# Patient Record
Sex: Male | Born: 1935
Health system: Southern US, Community
[De-identification: ages and names within clinical notes are randomized; demographics above are authoritative.]

## PROBLEM LIST (undated history)

## (undated) DIAGNOSIS — D179 Benign lipomatous neoplasm, unspecified: Secondary | ICD-10-CM

## (undated) DIAGNOSIS — N281 Cyst of kidney, acquired: Secondary | ICD-10-CM

## (undated) DIAGNOSIS — Z960 Presence of urogenital implants: Secondary | ICD-10-CM

## (undated) DIAGNOSIS — G4733 Obstructive sleep apnea (adult) (pediatric): Secondary | ICD-10-CM

## (undated) DIAGNOSIS — E039 Hypothyroidism, unspecified: Secondary | ICD-10-CM

## (undated) DIAGNOSIS — E785 Hyperlipidemia, unspecified: Secondary | ICD-10-CM

## (undated) DIAGNOSIS — R0600 Dyspnea, unspecified: Secondary | ICD-10-CM

## (undated) DIAGNOSIS — D649 Anemia, unspecified: Secondary | ICD-10-CM

## (undated) DIAGNOSIS — J189 Pneumonia, unspecified organism: Secondary | ICD-10-CM

## (undated) DIAGNOSIS — K219 Gastro-esophageal reflux disease without esophagitis: Secondary | ICD-10-CM

## (undated) DIAGNOSIS — Z8546 Personal history of malignant neoplasm of prostate: Secondary | ICD-10-CM

## (undated) DIAGNOSIS — Z8679 Personal history of other diseases of the circulatory system: Secondary | ICD-10-CM

## (undated) DIAGNOSIS — C801 Malignant (primary) neoplasm, unspecified: Secondary | ICD-10-CM

## (undated) DIAGNOSIS — I700 Atherosclerosis of aorta: Secondary | ICD-10-CM

## (undated) DIAGNOSIS — E079 Disorder of thyroid, unspecified: Secondary | ICD-10-CM

## (undated) DIAGNOSIS — G4730 Sleep apnea, unspecified: Secondary | ICD-10-CM

## (undated) DIAGNOSIS — G510 Bell's palsy: Secondary | ICD-10-CM

## (undated) DIAGNOSIS — R7881 Bacteremia: Secondary | ICD-10-CM

## (undated) DIAGNOSIS — I10 Essential (primary) hypertension: Secondary | ICD-10-CM

## (undated) DIAGNOSIS — I2510 Atherosclerotic heart disease of native coronary artery without angina pectoris: Secondary | ICD-10-CM

## (undated) DIAGNOSIS — R0609 Other forms of dyspnea: Secondary | ICD-10-CM

## (undated) DIAGNOSIS — N200 Calculus of kidney: Secondary | ICD-10-CM

## (undated) DIAGNOSIS — D494 Neoplasm of unspecified behavior of bladder: Secondary | ICD-10-CM

## (undated) HISTORY — DX: Atherosclerotic heart disease of native coronary artery without angina pectoris: I25.10

## (undated) HISTORY — DX: Personal history of malignant neoplasm of prostate: Z85.46

## (undated) HISTORY — PX: TONSILLECTOMY: SUR1361

## (undated) HISTORY — PX: ESOPHAGOGASTRODUODENOSCOPY: SHX1529

## (undated) HISTORY — DX: Anemia, unspecified: D64.9

## (undated) HISTORY — DX: Sleep apnea, unspecified: G47.30

## (undated) HISTORY — DX: Hyperlipidemia, unspecified: E78.5

## (undated) HISTORY — DX: Benign lipomatous neoplasm, unspecified: D17.9

## (undated) HISTORY — DX: Bell's palsy: G51.0

## (undated) HISTORY — DX: Neoplasm of unspecified behavior of bladder: D49.4

## (undated) HISTORY — PX: PROSTATE SURGERY: SHX751

## (undated) HISTORY — DX: Bacteremia: R78.81

## (undated) HISTORY — PX: COLONOSCOPY: SHX174

## (undated) HISTORY — DX: Disorder of thyroid, unspecified: E07.9

## (undated) HISTORY — DX: Essential (primary) hypertension: I10

---

## 1973-09-22 HISTORY — PX: PROSTATE SURGERY: SHX751

## 1982-09-22 HISTORY — PX: OTHER SURGICAL HISTORY: SHX169

## 1984-09-22 HISTORY — PX: NASAL SEPTUM SURGERY: SHX37

## 1988-09-22 HISTORY — PX: NASAL SINUS SURGERY: SHX719

## 1990-08-13 HISTORY — PX: CARDIAC CATHETERIZATION: SHX172

## 2001-09-22 HISTORY — PX: OTHER SURGICAL HISTORY: SHX169

## 2001-11-30 ENCOUNTER — Ambulatory Visit (HOSPITAL_COMMUNITY): Admission: RE | Admit: 2001-11-30 | Discharge: 2001-11-30 | Payer: Self-pay | Admitting: Gastroenterology

## 2001-12-22 ENCOUNTER — Ambulatory Visit (HOSPITAL_COMMUNITY): Admission: RE | Admit: 2001-12-22 | Discharge: 2001-12-22 | Payer: Self-pay | Admitting: Gastroenterology

## 2003-09-23 HISTORY — PX: EYE SURGERY: SHX253

## 2003-09-23 HISTORY — PX: APPENDECTOMY: SHX54

## 2003-09-23 HISTORY — PX: CATARACT EXTRACTION: SUR2

## 2005-10-03 ENCOUNTER — Ambulatory Visit: Payer: Self-pay | Admitting: Urology

## 2006-04-08 ENCOUNTER — Ambulatory Visit: Payer: Self-pay | Admitting: Urology

## 2006-09-22 HISTORY — PX: CATARACT EXTRACTION: SUR2

## 2006-09-22 HISTORY — PX: EYE SURGERY: SHX253

## 2006-12-29 ENCOUNTER — Ambulatory Visit: Payer: Self-pay | Admitting: Urology

## 2007-07-10 ENCOUNTER — Emergency Department: Payer: Self-pay | Admitting: Emergency Medicine

## 2007-07-16 ENCOUNTER — Ambulatory Visit: Payer: Self-pay | Admitting: Urology

## 2009-01-05 DIAGNOSIS — G4730 Sleep apnea, unspecified: Secondary | ICD-10-CM | POA: Insufficient documentation

## 2009-11-05 ENCOUNTER — Ambulatory Visit: Payer: Self-pay | Admitting: Urology

## 2009-11-21 ENCOUNTER — Ambulatory Visit: Payer: Self-pay | Admitting: Specialist

## 2009-11-21 ENCOUNTER — Other Ambulatory Visit: Payer: Self-pay | Admitting: Specialist

## 2010-11-27 ENCOUNTER — Emergency Department: Payer: Self-pay | Admitting: Emergency Medicine

## 2010-12-04 ENCOUNTER — Ambulatory Visit: Payer: Self-pay | Admitting: Family Medicine

## 2011-02-21 DIAGNOSIS — B9562 Methicillin resistant Staphylococcus aureus infection as the cause of diseases classified elsewhere: Secondary | ICD-10-CM

## 2011-02-21 DIAGNOSIS — R7881 Bacteremia: Secondary | ICD-10-CM

## 2011-02-21 HISTORY — DX: Methicillin resistant Staphylococcus aureus infection as the cause of diseases classified elsewhere: B95.62

## 2011-02-21 HISTORY — DX: Bacteremia: R78.81

## 2011-02-24 ENCOUNTER — Ambulatory Visit: Payer: Self-pay | Admitting: Urology

## 2011-02-24 ENCOUNTER — Inpatient Hospital Stay: Payer: Self-pay | Admitting: Internal Medicine

## 2011-02-27 DIAGNOSIS — I517 Cardiomegaly: Secondary | ICD-10-CM

## 2011-02-28 DIAGNOSIS — I059 Rheumatic mitral valve disease, unspecified: Secondary | ICD-10-CM

## 2011-03-05 ENCOUNTER — Encounter: Payer: Self-pay | Admitting: Internal Medicine

## 2011-03-23 ENCOUNTER — Encounter: Payer: Self-pay | Admitting: Internal Medicine

## 2011-03-27 ENCOUNTER — Ambulatory Visit: Payer: Self-pay | Admitting: Urology

## 2011-04-23 ENCOUNTER — Encounter: Payer: Self-pay | Admitting: Internal Medicine

## 2011-06-26 HISTORY — PX: HAND SURGERY: SHX662

## 2011-07-09 ENCOUNTER — Ambulatory Visit: Payer: Self-pay | Admitting: Urology

## 2011-07-25 ENCOUNTER — Emergency Department: Payer: Self-pay | Admitting: Internal Medicine

## 2011-10-07 ENCOUNTER — Ambulatory Visit: Payer: Self-pay | Admitting: Urology

## 2011-10-07 DIAGNOSIS — D414 Neoplasm of uncertain behavior of bladder: Secondary | ICD-10-CM | POA: Diagnosis not present

## 2011-10-07 DIAGNOSIS — Z01812 Encounter for preprocedural laboratory examination: Secondary | ICD-10-CM | POA: Diagnosis not present

## 2011-10-07 LAB — BASIC METABOLIC PANEL
Anion Gap: 4 — ABNORMAL LOW (ref 7–16)
Calcium, Total: 8.8 mg/dL (ref 8.5–10.1)
Chloride: 107 mmol/L (ref 98–107)
Co2: 30 mmol/L (ref 21–32)
Creatinine: 1.45 mg/dL — ABNORMAL HIGH (ref 0.60–1.30)
Glucose: 120 mg/dL — ABNORMAL HIGH (ref 65–99)
Potassium: 5 mmol/L (ref 3.5–5.1)

## 2011-10-07 LAB — CBC WITH DIFFERENTIAL/PLATELET
Basophil #: 0 10*3/uL (ref 0.0–0.1)
Basophil %: 0.4 %
Eosinophil #: 0.1 10*3/uL (ref 0.0–0.7)
HCT: 37.3 % — ABNORMAL LOW (ref 40.0–52.0)
HGB: 12.5 g/dL — ABNORMAL LOW (ref 13.0–18.0)
Lymphocyte #: 2.1 10*3/uL (ref 1.0–3.6)
Lymphocyte %: 29.3 %
MCH: 31.3 pg (ref 26.0–34.0)
MCHC: 33.5 g/dL (ref 32.0–36.0)
MCV: 93 fL (ref 80–100)
Monocyte #: 0.8 10*3/uL — ABNORMAL HIGH (ref 0.0–0.7)
Platelet: 227 10*3/uL (ref 150–440)
RDW: 13.8 % (ref 11.5–14.5)

## 2011-10-14 ENCOUNTER — Ambulatory Visit: Payer: Self-pay | Admitting: Urology

## 2011-10-14 DIAGNOSIS — Z87891 Personal history of nicotine dependence: Secondary | ICD-10-CM | POA: Diagnosis not present

## 2011-10-14 DIAGNOSIS — D303 Benign neoplasm of bladder: Secondary | ICD-10-CM | POA: Diagnosis not present

## 2011-10-14 DIAGNOSIS — D414 Neoplasm of uncertain behavior of bladder: Secondary | ICD-10-CM | POA: Diagnosis not present

## 2011-10-14 DIAGNOSIS — N3040 Irradiation cystitis without hematuria: Secondary | ICD-10-CM | POA: Diagnosis not present

## 2011-10-14 DIAGNOSIS — G4730 Sleep apnea, unspecified: Secondary | ICD-10-CM | POA: Diagnosis not present

## 2011-10-14 DIAGNOSIS — G510 Bell's palsy: Secondary | ICD-10-CM | POA: Diagnosis not present

## 2011-10-14 DIAGNOSIS — N289 Disorder of kidney and ureter, unspecified: Secondary | ICD-10-CM | POA: Diagnosis not present

## 2011-10-14 DIAGNOSIS — I2510 Atherosclerotic heart disease of native coronary artery without angina pectoris: Secondary | ICD-10-CM | POA: Diagnosis not present

## 2011-10-14 DIAGNOSIS — Z79899 Other long term (current) drug therapy: Secondary | ICD-10-CM | POA: Diagnosis not present

## 2011-10-14 DIAGNOSIS — J40 Bronchitis, not specified as acute or chronic: Secondary | ICD-10-CM | POA: Diagnosis not present

## 2011-10-14 DIAGNOSIS — E079 Disorder of thyroid, unspecified: Secondary | ICD-10-CM | POA: Diagnosis not present

## 2011-10-14 DIAGNOSIS — D494 Neoplasm of unspecified behavior of bladder: Secondary | ICD-10-CM | POA: Diagnosis not present

## 2011-10-15 LAB — PATHOLOGY REPORT

## 2011-10-27 DIAGNOSIS — L259 Unspecified contact dermatitis, unspecified cause: Secondary | ICD-10-CM | POA: Diagnosis not present

## 2011-10-27 DIAGNOSIS — D414 Neoplasm of uncertain behavior of bladder: Secondary | ICD-10-CM | POA: Diagnosis not present

## 2011-10-27 DIAGNOSIS — N182 Chronic kidney disease, stage 2 (mild): Secondary | ICD-10-CM | POA: Diagnosis not present

## 2011-10-27 DIAGNOSIS — N3020 Other chronic cystitis without hematuria: Secondary | ICD-10-CM | POA: Diagnosis not present

## 2011-10-27 DIAGNOSIS — R310 Gross hematuria: Secondary | ICD-10-CM | POA: Diagnosis not present

## 2011-11-06 DIAGNOSIS — I10 Essential (primary) hypertension: Secondary | ICD-10-CM | POA: Diagnosis not present

## 2011-11-06 DIAGNOSIS — Z0000 Encounter for general adult medical examination without abnormal findings: Secondary | ICD-10-CM | POA: Diagnosis not present

## 2011-11-06 DIAGNOSIS — E785 Hyperlipidemia, unspecified: Secondary | ICD-10-CM | POA: Diagnosis not present

## 2011-11-06 DIAGNOSIS — E039 Hypothyroidism, unspecified: Secondary | ICD-10-CM | POA: Diagnosis not present

## 2011-11-10 DIAGNOSIS — E7800 Pure hypercholesterolemia, unspecified: Secondary | ICD-10-CM | POA: Diagnosis not present

## 2011-12-01 DIAGNOSIS — H35349 Macular cyst, hole, or pseudohole, unspecified eye: Secondary | ICD-10-CM | POA: Diagnosis not present

## 2012-01-16 DIAGNOSIS — E785 Hyperlipidemia, unspecified: Secondary | ICD-10-CM | POA: Diagnosis not present

## 2012-01-16 DIAGNOSIS — R55 Syncope and collapse: Secondary | ICD-10-CM | POA: Diagnosis not present

## 2012-01-16 DIAGNOSIS — N3020 Other chronic cystitis without hematuria: Secondary | ICD-10-CM | POA: Diagnosis not present

## 2012-01-16 DIAGNOSIS — Z0000 Encounter for general adult medical examination without abnormal findings: Secondary | ICD-10-CM | POA: Diagnosis not present

## 2012-01-16 DIAGNOSIS — E039 Hypothyroidism, unspecified: Secondary | ICD-10-CM | POA: Diagnosis not present

## 2012-01-20 DIAGNOSIS — R509 Fever, unspecified: Secondary | ICD-10-CM | POA: Diagnosis not present

## 2012-01-20 DIAGNOSIS — Z0000 Encounter for general adult medical examination without abnormal findings: Secondary | ICD-10-CM | POA: Diagnosis not present

## 2012-01-20 DIAGNOSIS — E039 Hypothyroidism, unspecified: Secondary | ICD-10-CM | POA: Diagnosis not present

## 2012-01-20 DIAGNOSIS — E785 Hyperlipidemia, unspecified: Secondary | ICD-10-CM | POA: Diagnosis not present

## 2012-03-15 DIAGNOSIS — D414 Neoplasm of uncertain behavior of bladder: Secondary | ICD-10-CM | POA: Diagnosis not present

## 2012-03-15 DIAGNOSIS — R310 Gross hematuria: Secondary | ICD-10-CM | POA: Diagnosis not present

## 2012-03-15 DIAGNOSIS — R3129 Other microscopic hematuria: Secondary | ICD-10-CM | POA: Diagnosis not present

## 2012-03-16 ENCOUNTER — Ambulatory Visit: Payer: Self-pay | Admitting: Urology

## 2012-03-16 DIAGNOSIS — I2510 Atherosclerotic heart disease of native coronary artery without angina pectoris: Secondary | ICD-10-CM | POA: Diagnosis not present

## 2012-03-16 DIAGNOSIS — Z01812 Encounter for preprocedural laboratory examination: Secondary | ICD-10-CM | POA: Diagnosis not present

## 2012-03-16 LAB — CBC WITH DIFFERENTIAL/PLATELET
Basophil %: 0.5 %
HCT: 37.7 % — ABNORMAL LOW (ref 40.0–52.0)
HGB: 12.6 g/dL — ABNORMAL LOW (ref 13.0–18.0)
Lymphocyte #: 2 10*3/uL (ref 1.0–3.6)
Lymphocyte %: 31 %
MCH: 31.3 pg (ref 26.0–34.0)
Neutrophil #: 3.6 10*3/uL (ref 1.4–6.5)
Neutrophil %: 55.5 %
RDW: 13.6 % (ref 11.5–14.5)
WBC: 6.5 10*3/uL (ref 3.8–10.6)

## 2012-03-16 LAB — BASIC METABOLIC PANEL
Co2: 31 mmol/L (ref 21–32)
EGFR (African American): 43 — ABNORMAL LOW
EGFR (Non-African Amer.): 37 — ABNORMAL LOW
Osmolality: 287 (ref 275–301)
Sodium: 141 mmol/L (ref 136–145)

## 2012-03-19 DIAGNOSIS — Z0389 Encounter for observation for other suspected diseases and conditions ruled out: Secondary | ICD-10-CM | POA: Diagnosis not present

## 2012-03-19 DIAGNOSIS — R011 Cardiac murmur, unspecified: Secondary | ICD-10-CM | POA: Diagnosis not present

## 2012-03-23 ENCOUNTER — Ambulatory Visit: Payer: Self-pay | Admitting: Urology

## 2012-03-23 DIAGNOSIS — N269 Renal sclerosis, unspecified: Secondary | ICD-10-CM | POA: Diagnosis not present

## 2012-03-23 DIAGNOSIS — Z8546 Personal history of malignant neoplasm of prostate: Secondary | ICD-10-CM | POA: Diagnosis not present

## 2012-03-23 DIAGNOSIS — Z801 Family history of malignant neoplasm of trachea, bronchus and lung: Secondary | ICD-10-CM | POA: Diagnosis not present

## 2012-03-23 DIAGNOSIS — Z882 Allergy status to sulfonamides status: Secondary | ICD-10-CM | POA: Diagnosis not present

## 2012-03-23 DIAGNOSIS — Z7982 Long term (current) use of aspirin: Secondary | ICD-10-CM | POA: Diagnosis not present

## 2012-03-23 DIAGNOSIS — D414 Neoplasm of uncertain behavior of bladder: Secondary | ICD-10-CM | POA: Diagnosis not present

## 2012-03-23 DIAGNOSIS — D494 Neoplasm of unspecified behavior of bladder: Secondary | ICD-10-CM | POA: Diagnosis not present

## 2012-03-23 DIAGNOSIS — Z87891 Personal history of nicotine dependence: Secondary | ICD-10-CM | POA: Diagnosis not present

## 2012-03-23 DIAGNOSIS — N3020 Other chronic cystitis without hematuria: Secondary | ICD-10-CM | POA: Diagnosis not present

## 2012-03-23 DIAGNOSIS — N182 Chronic kidney disease, stage 2 (mild): Secondary | ICD-10-CM | POA: Diagnosis not present

## 2012-03-23 DIAGNOSIS — D303 Benign neoplasm of bladder: Secondary | ICD-10-CM | POA: Diagnosis not present

## 2012-03-23 DIAGNOSIS — Z881 Allergy status to other antibiotic agents status: Secondary | ICD-10-CM | POA: Diagnosis not present

## 2012-03-23 DIAGNOSIS — Z79899 Other long term (current) drug therapy: Secondary | ICD-10-CM | POA: Diagnosis not present

## 2012-03-23 DIAGNOSIS — I10 Essential (primary) hypertension: Secondary | ICD-10-CM | POA: Diagnosis not present

## 2012-03-23 DIAGNOSIS — Z87440 Personal history of urinary (tract) infections: Secondary | ICD-10-CM | POA: Diagnosis not present

## 2012-03-23 DIAGNOSIS — Z9079 Acquired absence of other genital organ(s): Secondary | ICD-10-CM | POA: Diagnosis not present

## 2012-03-23 DIAGNOSIS — Z8614 Personal history of Methicillin resistant Staphylococcus aureus infection: Secondary | ICD-10-CM | POA: Diagnosis not present

## 2012-03-23 DIAGNOSIS — C679 Malignant neoplasm of bladder, unspecified: Secondary | ICD-10-CM | POA: Diagnosis not present

## 2012-03-23 DIAGNOSIS — G4730 Sleep apnea, unspecified: Secondary | ICD-10-CM | POA: Diagnosis not present

## 2012-03-23 DIAGNOSIS — I2510 Atherosclerotic heart disease of native coronary artery without angina pectoris: Secondary | ICD-10-CM | POA: Diagnosis not present

## 2012-03-23 DIAGNOSIS — Z888 Allergy status to other drugs, medicaments and biological substances status: Secondary | ICD-10-CM | POA: Diagnosis not present

## 2012-03-23 DIAGNOSIS — J42 Unspecified chronic bronchitis: Secondary | ICD-10-CM | POA: Diagnosis not present

## 2012-03-23 DIAGNOSIS — J45909 Unspecified asthma, uncomplicated: Secondary | ICD-10-CM | POA: Diagnosis not present

## 2012-04-08 DIAGNOSIS — N182 Chronic kidney disease, stage 2 (mild): Secondary | ICD-10-CM | POA: Diagnosis not present

## 2012-04-08 DIAGNOSIS — D414 Neoplasm of uncertain behavior of bladder: Secondary | ICD-10-CM | POA: Diagnosis not present

## 2012-04-08 DIAGNOSIS — Z8546 Personal history of malignant neoplasm of prostate: Secondary | ICD-10-CM | POA: Diagnosis not present

## 2012-04-08 DIAGNOSIS — N3020 Other chronic cystitis without hematuria: Secondary | ICD-10-CM | POA: Diagnosis not present

## 2012-06-24 ENCOUNTER — Ambulatory Visit: Payer: Self-pay | Admitting: Unknown Physician Specialty

## 2012-06-24 DIAGNOSIS — I2510 Atherosclerotic heart disease of native coronary artery without angina pectoris: Secondary | ICD-10-CM | POA: Diagnosis not present

## 2012-06-24 DIAGNOSIS — E785 Hyperlipidemia, unspecified: Secondary | ICD-10-CM | POA: Diagnosis not present

## 2012-06-24 DIAGNOSIS — Z8546 Personal history of malignant neoplasm of prostate: Secondary | ICD-10-CM | POA: Diagnosis not present

## 2012-06-24 DIAGNOSIS — Z801 Family history of malignant neoplasm of trachea, bronchus and lung: Secondary | ICD-10-CM | POA: Diagnosis not present

## 2012-06-24 DIAGNOSIS — K648 Other hemorrhoids: Secondary | ICD-10-CM | POA: Diagnosis not present

## 2012-06-24 DIAGNOSIS — G4730 Sleep apnea, unspecified: Secondary | ICD-10-CM | POA: Diagnosis not present

## 2012-06-24 DIAGNOSIS — Z87891 Personal history of nicotine dependence: Secondary | ICD-10-CM | POA: Diagnosis not present

## 2012-06-24 DIAGNOSIS — Z79899 Other long term (current) drug therapy: Secondary | ICD-10-CM | POA: Diagnosis not present

## 2012-06-24 DIAGNOSIS — G510 Bell's palsy: Secondary | ICD-10-CM | POA: Diagnosis not present

## 2012-06-24 DIAGNOSIS — Z7982 Long term (current) use of aspirin: Secondary | ICD-10-CM | POA: Diagnosis not present

## 2012-06-24 DIAGNOSIS — R32 Unspecified urinary incontinence: Secondary | ICD-10-CM | POA: Diagnosis not present

## 2012-06-24 DIAGNOSIS — I10 Essential (primary) hypertension: Secondary | ICD-10-CM | POA: Diagnosis not present

## 2012-06-24 DIAGNOSIS — E039 Hypothyroidism, unspecified: Secondary | ICD-10-CM | POA: Diagnosis not present

## 2012-06-24 DIAGNOSIS — Z1211 Encounter for screening for malignant neoplasm of colon: Secondary | ICD-10-CM | POA: Diagnosis not present

## 2012-06-24 DIAGNOSIS — K5520 Angiodysplasia of colon without hemorrhage: Secondary | ICD-10-CM | POA: Diagnosis not present

## 2012-06-24 LAB — HM COLONOSCOPY

## 2012-07-01 DIAGNOSIS — E039 Hypothyroidism, unspecified: Secondary | ICD-10-CM | POA: Diagnosis not present

## 2012-07-01 DIAGNOSIS — Z23 Encounter for immunization: Secondary | ICD-10-CM | POA: Diagnosis not present

## 2012-07-01 DIAGNOSIS — E7800 Pure hypercholesterolemia, unspecified: Secondary | ICD-10-CM | POA: Diagnosis not present

## 2012-07-01 DIAGNOSIS — Z0000 Encounter for general adult medical examination without abnormal findings: Secondary | ICD-10-CM | POA: Diagnosis not present

## 2012-07-01 DIAGNOSIS — I2510 Atherosclerotic heart disease of native coronary artery without angina pectoris: Secondary | ICD-10-CM | POA: Diagnosis not present

## 2012-07-01 DIAGNOSIS — I10 Essential (primary) hypertension: Secondary | ICD-10-CM | POA: Diagnosis not present

## 2012-07-02 DIAGNOSIS — I10 Essential (primary) hypertension: Secondary | ICD-10-CM | POA: Diagnosis not present

## 2012-07-02 DIAGNOSIS — E039 Hypothyroidism, unspecified: Secondary | ICD-10-CM | POA: Diagnosis not present

## 2012-07-02 DIAGNOSIS — E7800 Pure hypercholesterolemia, unspecified: Secondary | ICD-10-CM | POA: Diagnosis not present

## 2012-07-02 DIAGNOSIS — Z8546 Personal history of malignant neoplasm of prostate: Secondary | ICD-10-CM | POA: Diagnosis not present

## 2012-08-12 DIAGNOSIS — R310 Gross hematuria: Secondary | ICD-10-CM | POA: Diagnosis not present

## 2012-08-12 DIAGNOSIS — D414 Neoplasm of uncertain behavior of bladder: Secondary | ICD-10-CM | POA: Diagnosis not present

## 2012-08-12 DIAGNOSIS — Z8546 Personal history of malignant neoplasm of prostate: Secondary | ICD-10-CM | POA: Diagnosis not present

## 2012-08-16 DIAGNOSIS — L219 Seborrheic dermatitis, unspecified: Secondary | ICD-10-CM | POA: Diagnosis not present

## 2012-08-16 DIAGNOSIS — L570 Actinic keratosis: Secondary | ICD-10-CM | POA: Diagnosis not present

## 2012-09-21 DIAGNOSIS — C44221 Squamous cell carcinoma of skin of unspecified ear and external auricular canal: Secondary | ICD-10-CM | POA: Diagnosis not present

## 2012-09-21 DIAGNOSIS — D485 Neoplasm of uncertain behavior of skin: Secondary | ICD-10-CM | POA: Diagnosis not present

## 2012-10-16 DIAGNOSIS — I10 Essential (primary) hypertension: Secondary | ICD-10-CM | POA: Diagnosis not present

## 2012-10-16 DIAGNOSIS — I4891 Unspecified atrial fibrillation: Secondary | ICD-10-CM | POA: Diagnosis not present

## 2012-10-17 DIAGNOSIS — I4891 Unspecified atrial fibrillation: Secondary | ICD-10-CM | POA: Diagnosis not present

## 2012-10-17 DIAGNOSIS — I10 Essential (primary) hypertension: Secondary | ICD-10-CM | POA: Diagnosis not present

## 2012-10-18 DIAGNOSIS — K921 Melena: Secondary | ICD-10-CM | POA: Insufficient documentation

## 2012-10-18 DIAGNOSIS — R079 Chest pain, unspecified: Secondary | ICD-10-CM | POA: Diagnosis not present

## 2012-10-18 DIAGNOSIS — IMO0001 Reserved for inherently not codable concepts without codable children: Secondary | ICD-10-CM | POA: Insufficient documentation

## 2012-10-18 DIAGNOSIS — K5900 Constipation, unspecified: Secondary | ICD-10-CM | POA: Diagnosis not present

## 2012-10-18 DIAGNOSIS — I2510 Atherosclerotic heart disease of native coronary artery without angina pectoris: Secondary | ICD-10-CM | POA: Diagnosis not present

## 2012-10-19 DIAGNOSIS — I209 Angina pectoris, unspecified: Secondary | ICD-10-CM | POA: Diagnosis not present

## 2012-10-19 DIAGNOSIS — R011 Cardiac murmur, unspecified: Secondary | ICD-10-CM | POA: Diagnosis not present

## 2012-10-20 DIAGNOSIS — C44221 Squamous cell carcinoma of skin of unspecified ear and external auricular canal: Secondary | ICD-10-CM | POA: Diagnosis not present

## 2012-10-21 ENCOUNTER — Inpatient Hospital Stay: Payer: Self-pay | Admitting: Internal Medicine

## 2012-10-21 DIAGNOSIS — Z87891 Personal history of nicotine dependence: Secondary | ICD-10-CM | POA: Diagnosis not present

## 2012-10-21 DIAGNOSIS — I2510 Atherosclerotic heart disease of native coronary artery without angina pectoris: Secondary | ICD-10-CM | POA: Diagnosis not present

## 2012-10-21 DIAGNOSIS — N189 Chronic kidney disease, unspecified: Secondary | ICD-10-CM | POA: Diagnosis not present

## 2012-10-21 DIAGNOSIS — K921 Melena: Secondary | ICD-10-CM | POA: Diagnosis not present

## 2012-10-21 DIAGNOSIS — Z91041 Radiographic dye allergy status: Secondary | ICD-10-CM | POA: Diagnosis not present

## 2012-10-21 DIAGNOSIS — R195 Other fecal abnormalities: Secondary | ICD-10-CM | POA: Diagnosis not present

## 2012-10-21 DIAGNOSIS — D649 Anemia, unspecified: Secondary | ICD-10-CM | POA: Diagnosis not present

## 2012-10-21 DIAGNOSIS — K2990 Gastroduodenitis, unspecified, without bleeding: Secondary | ICD-10-CM | POA: Diagnosis not present

## 2012-10-21 DIAGNOSIS — Z9079 Acquired absence of other genital organ(s): Secondary | ICD-10-CM | POA: Diagnosis not present

## 2012-10-21 DIAGNOSIS — I129 Hypertensive chronic kidney disease with stage 1 through stage 4 chronic kidney disease, or unspecified chronic kidney disease: Secondary | ICD-10-CM | POA: Diagnosis present

## 2012-10-21 DIAGNOSIS — K2970 Gastritis, unspecified, without bleeding: Secondary | ICD-10-CM | POA: Diagnosis present

## 2012-10-21 DIAGNOSIS — E785 Hyperlipidemia, unspecified: Secondary | ICD-10-CM | POA: Diagnosis not present

## 2012-10-21 DIAGNOSIS — Z7902 Long term (current) use of antithrombotics/antiplatelets: Secondary | ICD-10-CM | POA: Diagnosis not present

## 2012-10-21 DIAGNOSIS — K5520 Angiodysplasia of colon without hemorrhage: Secondary | ICD-10-CM | POA: Diagnosis not present

## 2012-10-21 DIAGNOSIS — D62 Acute posthemorrhagic anemia: Secondary | ICD-10-CM | POA: Diagnosis not present

## 2012-10-21 DIAGNOSIS — K920 Hematemesis: Secondary | ICD-10-CM | POA: Diagnosis not present

## 2012-10-21 DIAGNOSIS — K922 Gastrointestinal hemorrhage, unspecified: Secondary | ICD-10-CM | POA: Diagnosis not present

## 2012-10-21 DIAGNOSIS — Z888 Allergy status to other drugs, medicaments and biological substances status: Secondary | ICD-10-CM | POA: Diagnosis not present

## 2012-10-21 DIAGNOSIS — Z8546 Personal history of malignant neoplasm of prostate: Secondary | ICD-10-CM | POA: Diagnosis not present

## 2012-10-21 DIAGNOSIS — Z882 Allergy status to sulfonamides status: Secondary | ICD-10-CM | POA: Diagnosis not present

## 2012-10-21 DIAGNOSIS — I10 Essential (primary) hypertension: Secondary | ICD-10-CM | POA: Diagnosis not present

## 2012-10-21 DIAGNOSIS — Z801 Family history of malignant neoplasm of trachea, bronchus and lung: Secondary | ICD-10-CM | POA: Diagnosis not present

## 2012-10-21 DIAGNOSIS — Z7982 Long term (current) use of aspirin: Secondary | ICD-10-CM | POA: Diagnosis not present

## 2012-10-21 LAB — CBC
HCT: 22.1 % — ABNORMAL LOW (ref 40.0–52.0)
HGB: 7.2 g/dL — ABNORMAL LOW (ref 13.0–18.0)
MCH: 30.5 pg (ref 26.0–34.0)
MCV: 93 fL (ref 80–100)
RBC: 2.37 10*6/uL — ABNORMAL LOW (ref 4.40–5.90)
RDW: 13.5 % (ref 11.5–14.5)

## 2012-10-21 LAB — COMPREHENSIVE METABOLIC PANEL
Albumin: 3.6 g/dL (ref 3.4–5.0)
BUN: 32 mg/dL — ABNORMAL HIGH (ref 7–18)
Bilirubin,Total: 0.2 mg/dL (ref 0.2–1.0)
Calcium, Total: 8.4 mg/dL — ABNORMAL LOW (ref 8.5–10.1)
Co2: 20 mmol/L — ABNORMAL LOW (ref 21–32)
Creatinine: 1.48 mg/dL — ABNORMAL HIGH (ref 0.60–1.30)
EGFR (African American): 50 — ABNORMAL LOW
EGFR (Non-African Amer.): 43 — ABNORMAL LOW
Glucose: 116 mg/dL — ABNORMAL HIGH (ref 65–99)
Osmolality: 291 (ref 275–301)
Potassium: 4.4 mmol/L (ref 3.5–5.1)
SGOT(AST): 24 U/L (ref 15–37)
Sodium: 142 mmol/L (ref 136–145)

## 2012-10-21 LAB — URINALYSIS, COMPLETE
Blood: NEGATIVE
Glucose,UR: NEGATIVE mg/dL (ref 0–75)
Ketone: NEGATIVE
Leukocyte Esterase: NEGATIVE
Ph: 5 (ref 4.5–8.0)
Protein: NEGATIVE
RBC,UR: <1 /HPF (ref 0–5)
Specific Gravity: 1.018 (ref 1.003–1.030)
Squamous Epithelial: <1
WBC UR: 1 /HPF (ref 0–5)

## 2012-10-21 LAB — TROPONIN I: Troponin-I: <0.02 ng/mL

## 2012-10-21 LAB — LIPASE, BLOOD: Lipase: 278 U/L (ref 73–393)

## 2012-10-22 DIAGNOSIS — R195 Other fecal abnormalities: Secondary | ICD-10-CM | POA: Diagnosis not present

## 2012-10-22 LAB — CBC WITH DIFFERENTIAL/PLATELET
Basophil #: 0 10*3/uL (ref 0.0–0.1)
Basophil #: 0 10*3/uL (ref 0.0–0.1)
Basophil %: 0.3 %
Basophil %: 0.6 %
Eosinophil #: 0.1 10*3/uL (ref 0.0–0.7)
Eosinophil %: 2.3 %
HCT: 27 % — ABNORMAL LOW (ref 40.0–52.0)
HGB: 9.2 g/dL — ABNORMAL LOW (ref 13.0–18.0)
Lymphocyte #: 1.2 10*3/uL (ref 1.0–3.6)
Lymphocyte %: 20.9 %
MCH: 31.3 pg (ref 26.0–34.0)
MCV: 91 fL (ref 80–100)
Monocyte #: 0.7 x10 3/mm (ref 0.2–1.0)
Monocyte %: 11.1 %
Neutrophil #: 4.2 10*3/uL (ref 1.4–6.5)
Neutrophil %: 67.1 %
Neutrophil %: 63.5 %
Platelet: 199 10*3/uL (ref 150–440)
Platelet: 207 10*3/uL (ref 150–440)
RBC: 2.96 10*6/uL — ABNORMAL LOW (ref 4.40–5.90)
RBC: 2.95 10*6/uL — ABNORMAL LOW (ref 4.40–5.90)
RDW: 13.7 % (ref 11.5–14.5)
RDW: 13.4 % (ref 11.5–14.5)
WBC: 5.8 10*3/uL (ref 3.8–10.6)

## 2012-10-22 LAB — BASIC METABOLIC PANEL
Anion Gap: 6 — ABNORMAL LOW (ref 7–16)
Calcium, Total: 8.2 mg/dL — ABNORMAL LOW (ref 8.5–10.1)
Co2: 25 mmol/L (ref 21–32)
Creatinine: 1.4 mg/dL — ABNORMAL HIGH (ref 0.60–1.30)
EGFR (Non-African Amer.): 46 — ABNORMAL LOW
Osmolality: 289 (ref 275–301)

## 2012-10-23 LAB — BASIC METABOLIC PANEL
Anion Gap: 9 (ref 7–16)
Calcium, Total: 8.3 mg/dL — ABNORMAL LOW (ref 8.5–10.1)
Chloride: 111 mmol/L — ABNORMAL HIGH (ref 98–107)
Co2: 25 mmol/L (ref 21–32)
EGFR (African American): 52 — ABNORMAL LOW
EGFR (Non-African Amer.): 45 — ABNORMAL LOW
Glucose: 77 mg/dL (ref 65–99)
Osmolality: 289 (ref 275–301)
Potassium: 3.4 mmol/L — ABNORMAL LOW (ref 3.5–5.1)
Sodium: 145 mmol/L (ref 136–145)

## 2012-10-23 LAB — HEMOGLOBIN: HGB: 8.7 g/dL — ABNORMAL LOW (ref 13.0–18.0)

## 2012-10-24 LAB — CBC WITH DIFFERENTIAL/PLATELET
Basophil #: 0 10*3/uL (ref 0.0–0.1)
Basophil %: 0.4 %
Eosinophil %: 0.9 %
HGB: 9.2 g/dL — ABNORMAL LOW (ref 13.0–18.0)
Lymphocyte #: 1.5 10*3/uL (ref 1.0–3.6)
Lymphocyte %: 18.2 %
MCH: 31.1 pg (ref 26.0–34.0)
MCHC: 33.7 g/dL (ref 32.0–36.0)
Monocyte %: 16 %
Neutrophil #: 5.3 10*3/uL (ref 1.4–6.5)
Platelet: 196 10*3/uL (ref 150–440)
RDW: 13.9 % (ref 11.5–14.5)

## 2012-10-25 LAB — CBC WITH DIFFERENTIAL/PLATELET
HGB: 8 g/dL — ABNORMAL LOW (ref 13.0–18.0)
Lymphocyte %: 21.7 %
MCHC: 31.8 g/dL — ABNORMAL LOW (ref 32.0–36.0)
MCV: 93 fL (ref 80–100)
Neutrophil #: 4 10*3/uL (ref 1.4–6.5)
Neutrophil %: 56.5 %
Platelet: 214 10*3/uL (ref 150–440)
RBC: 2.72 10*6/uL — ABNORMAL LOW (ref 4.40–5.90)

## 2012-10-26 LAB — CBC WITH DIFFERENTIAL/PLATELET
Basophil #: 0 10*3/uL (ref 0.0–0.1)
Basophil %: 0.5 %
HCT: 25.3 % — ABNORMAL LOW (ref 40.0–52.0)
Lymphocyte #: 1.5 10*3/uL (ref 1.0–3.6)
MCV: 92 fL (ref 80–100)
Monocyte #: 0.8 x10 3/mm (ref 0.2–1.0)
Monocyte %: 13.2 %
Neutrophil #: 3.3 10*3/uL (ref 1.4–6.5)
Platelet: 199 10*3/uL (ref 150–440)
WBC: 5.9 10*3/uL (ref 3.8–10.6)

## 2012-11-02 DIAGNOSIS — D509 Iron deficiency anemia, unspecified: Secondary | ICD-10-CM | POA: Diagnosis not present

## 2012-11-29 DIAGNOSIS — H35349 Macular cyst, hole, or pseudohole, unspecified eye: Secondary | ICD-10-CM | POA: Diagnosis not present

## 2012-12-03 DIAGNOSIS — T82898A Other specified complication of vascular prosthetic devices, implants and grafts, initial encounter: Secondary | ICD-10-CM | POA: Diagnosis not present

## 2012-12-03 DIAGNOSIS — N184 Chronic kidney disease, stage 4 (severe): Secondary | ICD-10-CM | POA: Diagnosis not present

## 2013-01-20 DIAGNOSIS — L821 Other seborrheic keratosis: Secondary | ICD-10-CM | POA: Diagnosis not present

## 2013-01-20 DIAGNOSIS — D235 Other benign neoplasm of skin of trunk: Secondary | ICD-10-CM | POA: Diagnosis not present

## 2013-01-20 DIAGNOSIS — Z85828 Personal history of other malignant neoplasm of skin: Secondary | ICD-10-CM | POA: Diagnosis not present

## 2013-02-17 DIAGNOSIS — R079 Chest pain, unspecified: Secondary | ICD-10-CM | POA: Diagnosis not present

## 2013-02-17 DIAGNOSIS — R9431 Abnormal electrocardiogram [ECG] [EKG]: Secondary | ICD-10-CM | POA: Diagnosis not present

## 2013-02-17 DIAGNOSIS — I10 Essential (primary) hypertension: Secondary | ICD-10-CM | POA: Diagnosis not present

## 2013-05-28 DIAGNOSIS — I2510 Atherosclerotic heart disease of native coronary artery without angina pectoris: Secondary | ICD-10-CM | POA: Diagnosis not present

## 2013-05-28 DIAGNOSIS — K5900 Constipation, unspecified: Secondary | ICD-10-CM | POA: Diagnosis not present

## 2013-05-28 DIAGNOSIS — Z23 Encounter for immunization: Secondary | ICD-10-CM | POA: Diagnosis not present

## 2013-05-28 DIAGNOSIS — K921 Melena: Secondary | ICD-10-CM | POA: Diagnosis not present

## 2013-08-10 DIAGNOSIS — I10 Essential (primary) hypertension: Secondary | ICD-10-CM | POA: Diagnosis not present

## 2013-08-10 DIAGNOSIS — E039 Hypothyroidism, unspecified: Secondary | ICD-10-CM | POA: Diagnosis not present

## 2013-08-10 DIAGNOSIS — Z0000 Encounter for general adult medical examination without abnormal findings: Secondary | ICD-10-CM | POA: Diagnosis not present

## 2013-08-10 DIAGNOSIS — Z1331 Encounter for screening for depression: Secondary | ICD-10-CM | POA: Diagnosis not present

## 2013-08-10 DIAGNOSIS — Z23 Encounter for immunization: Secondary | ICD-10-CM | POA: Diagnosis not present

## 2013-08-10 DIAGNOSIS — Z1330 Encounter for screening examination for mental health and behavioral disorders, unspecified: Secondary | ICD-10-CM | POA: Diagnosis not present

## 2013-08-12 DIAGNOSIS — E039 Hypothyroidism, unspecified: Secondary | ICD-10-CM | POA: Diagnosis not present

## 2013-08-12 DIAGNOSIS — Z8546 Personal history of malignant neoplasm of prostate: Secondary | ICD-10-CM | POA: Diagnosis not present

## 2013-08-12 DIAGNOSIS — I10 Essential (primary) hypertension: Secondary | ICD-10-CM | POA: Diagnosis not present

## 2013-08-12 DIAGNOSIS — E7800 Pure hypercholesterolemia, unspecified: Secondary | ICD-10-CM | POA: Diagnosis not present

## 2013-09-29 DIAGNOSIS — Z85828 Personal history of other malignant neoplasm of skin: Secondary | ICD-10-CM | POA: Diagnosis not present

## 2013-09-29 DIAGNOSIS — L570 Actinic keratosis: Secondary | ICD-10-CM | POA: Diagnosis not present

## 2013-10-06 DIAGNOSIS — D414 Neoplasm of uncertain behavior of bladder: Secondary | ICD-10-CM | POA: Diagnosis not present

## 2013-10-06 DIAGNOSIS — Z8546 Personal history of malignant neoplasm of prostate: Secondary | ICD-10-CM | POA: Diagnosis not present

## 2014-01-02 DIAGNOSIS — H35349 Macular cyst, hole, or pseudohole, unspecified eye: Secondary | ICD-10-CM | POA: Diagnosis not present

## 2014-02-14 DIAGNOSIS — I10 Essential (primary) hypertension: Secondary | ICD-10-CM | POA: Diagnosis not present

## 2014-02-14 DIAGNOSIS — G4733 Obstructive sleep apnea (adult) (pediatric): Secondary | ICD-10-CM | POA: Diagnosis not present

## 2014-02-14 DIAGNOSIS — J449 Chronic obstructive pulmonary disease, unspecified: Secondary | ICD-10-CM | POA: Diagnosis not present

## 2014-02-14 DIAGNOSIS — E785 Hyperlipidemia, unspecified: Secondary | ICD-10-CM | POA: Diagnosis not present

## 2014-03-10 DIAGNOSIS — Z0000 Encounter for general adult medical examination without abnormal findings: Secondary | ICD-10-CM | POA: Diagnosis not present

## 2014-03-10 DIAGNOSIS — E039 Hypothyroidism, unspecified: Secondary | ICD-10-CM | POA: Diagnosis not present

## 2014-03-10 DIAGNOSIS — Z1342 Encounter for screening for global developmental delays (milestones): Secondary | ICD-10-CM | POA: Diagnosis not present

## 2014-03-10 DIAGNOSIS — Z1330 Encounter for screening examination for mental health and behavioral disorders, unspecified: Secondary | ICD-10-CM | POA: Diagnosis not present

## 2014-03-10 DIAGNOSIS — Z23 Encounter for immunization: Secondary | ICD-10-CM | POA: Diagnosis not present

## 2014-03-10 DIAGNOSIS — I10 Essential (primary) hypertension: Secondary | ICD-10-CM | POA: Diagnosis not present

## 2014-03-10 DIAGNOSIS — Z1331 Encounter for screening for depression: Secondary | ICD-10-CM | POA: Diagnosis not present

## 2014-03-10 LAB — TSH: TSH: 0.92 u[IU]/mL (ref <=5.90)

## 2014-06-22 DIAGNOSIS — Z23 Encounter for immunization: Secondary | ICD-10-CM | POA: Diagnosis not present

## 2014-08-11 DIAGNOSIS — R351 Nocturia: Secondary | ICD-10-CM | POA: Diagnosis not present

## 2014-08-11 DIAGNOSIS — D179 Benign lipomatous neoplasm, unspecified: Secondary | ICD-10-CM | POA: Diagnosis not present

## 2014-08-29 DIAGNOSIS — I2510 Atherosclerotic heart disease of native coronary artery without angina pectoris: Secondary | ICD-10-CM | POA: Diagnosis not present

## 2014-08-29 DIAGNOSIS — E780 Pure hypercholesterolemia: Secondary | ICD-10-CM | POA: Diagnosis not present

## 2014-08-29 DIAGNOSIS — I10 Essential (primary) hypertension: Secondary | ICD-10-CM | POA: Diagnosis not present

## 2014-08-29 DIAGNOSIS — Z1389 Encounter for screening for other disorder: Secondary | ICD-10-CM | POA: Diagnosis not present

## 2014-08-29 DIAGNOSIS — Z0000 Encounter for general adult medical examination without abnormal findings: Secondary | ICD-10-CM | POA: Diagnosis not present

## 2014-08-29 DIAGNOSIS — J309 Allergic rhinitis, unspecified: Secondary | ICD-10-CM | POA: Diagnosis not present

## 2014-08-30 DIAGNOSIS — I10 Essential (primary) hypertension: Secondary | ICD-10-CM | POA: Diagnosis not present

## 2014-08-30 DIAGNOSIS — Z125 Encounter for screening for malignant neoplasm of prostate: Secondary | ICD-10-CM | POA: Diagnosis not present

## 2014-08-30 DIAGNOSIS — E780 Pure hypercholesterolemia: Secondary | ICD-10-CM | POA: Diagnosis not present

## 2014-08-30 LAB — LIPID PANEL
Cholesterol: 132 mg/dL (ref 0–200)
HDL: 38 mg/dL (ref 35–70)
LDL Cholesterol: 67 mg/dL
TRIGLYCERIDES: 135 mg/dL (ref 40–160)

## 2014-08-30 LAB — BASIC METABOLIC PANEL
BUN: 19 mg/dL (ref 4–21)
Creatinine: 1.3 mg/dL (ref <=1.3)
Glucose: 99 mg/dL
Potassium: 4.8 mmol/L (ref 3.4–5.3)
SODIUM: 142 mmol/L (ref 137–147)

## 2014-08-30 LAB — PSA: PSA: 0.1

## 2014-10-05 DIAGNOSIS — H35342 Macular cyst, hole, or pseudohole, left eye: Secondary | ICD-10-CM | POA: Diagnosis not present

## 2014-10-20 DIAGNOSIS — N182 Chronic kidney disease, stage 2 (mild): Secondary | ICD-10-CM | POA: Diagnosis not present

## 2014-10-20 DIAGNOSIS — D303 Benign neoplasm of bladder: Secondary | ICD-10-CM | POA: Diagnosis not present

## 2014-10-20 DIAGNOSIS — N3020 Other chronic cystitis without hematuria: Secondary | ICD-10-CM | POA: Diagnosis not present

## 2014-10-20 DIAGNOSIS — R339 Retention of urine, unspecified: Secondary | ICD-10-CM | POA: Diagnosis not present

## 2014-10-20 DIAGNOSIS — Z8546 Personal history of malignant neoplasm of prostate: Secondary | ICD-10-CM | POA: Diagnosis not present

## 2014-10-26 DIAGNOSIS — H40052 Ocular hypertension, left eye: Secondary | ICD-10-CM | POA: Diagnosis not present

## 2014-11-13 DIAGNOSIS — L218 Other seborrheic dermatitis: Secondary | ICD-10-CM | POA: Diagnosis not present

## 2014-11-13 DIAGNOSIS — L570 Actinic keratosis: Secondary | ICD-10-CM | POA: Diagnosis not present

## 2014-11-13 DIAGNOSIS — X32XXXA Exposure to sunlight, initial encounter: Secondary | ICD-10-CM | POA: Diagnosis not present

## 2014-11-13 DIAGNOSIS — D485 Neoplasm of uncertain behavior of skin: Secondary | ICD-10-CM | POA: Diagnosis not present

## 2014-11-13 DIAGNOSIS — D044 Carcinoma in situ of skin of scalp and neck: Secondary | ICD-10-CM | POA: Diagnosis not present

## 2014-11-13 DIAGNOSIS — Z85828 Personal history of other malignant neoplasm of skin: Secondary | ICD-10-CM | POA: Diagnosis not present

## 2014-12-26 DIAGNOSIS — R194 Change in bowel habit: Secondary | ICD-10-CM | POA: Diagnosis not present

## 2014-12-26 DIAGNOSIS — J309 Allergic rhinitis, unspecified: Secondary | ICD-10-CM | POA: Diagnosis not present

## 2014-12-26 DIAGNOSIS — Z0000 Encounter for general adult medical examination without abnormal findings: Secondary | ICD-10-CM | POA: Diagnosis not present

## 2014-12-26 DIAGNOSIS — I10 Essential (primary) hypertension: Secondary | ICD-10-CM | POA: Diagnosis not present

## 2014-12-26 DIAGNOSIS — E780 Pure hypercholesterolemia: Secondary | ICD-10-CM | POA: Diagnosis not present

## 2014-12-26 DIAGNOSIS — Z1389 Encounter for screening for other disorder: Secondary | ICD-10-CM | POA: Diagnosis not present

## 2015-01-12 NOTE — Discharge Summary (Signed)
PATIENT NAME:  Roger Cook, Roger Cook MR#:  [id number] DATE OF BIRTH:  December 09, 1926  DATE OF ADMISSION:  10/21/2012 DATE OF DISCHARGE:  10/26/2012  PRIMARY CARE PHYSICIAN:  Kirstie Peri. Caryn Section, MD  DISCHARGE DIAGNOSES:   1.  Gastrointestinal bleed secondary to arteriovenous malformations in the small bowel.  2.  Acute blood loss anemia.  3.  Mild gastritis.  4.  Chronic kidney disease, stage III.  5.  Hypertension.   CONSULTANTS:  Dr. Vira Agar of gastroenterology and Dr. Delana Meyer of vascular surgery.   IMAGING STUDIES:  Chest x-ray showed no acute abnormalities.   PROCEDURES:   1.  EGD showed mild gastritis.  2.  Colonoscopy showed no diverticulosis or bleeding lesions. He did have melanotic stool in the colon.  3.  Capsule endoscopy showed 3 areas in the small bowel of AVMs with no active bleeding.   ADMITTING HISTORY AND PHYSICAL AND HOSPITAL COURSE:  Please see detailed H and P dictated on 10/21/2012. In brief, an 81 year old male patient with past history of hypertension and coronary artery disease on aspirin who presented to the hospital complaining of black-colored stools and lightheadedness and was sent to the Emergency Room after his hemoglobin was found to be 6.7. The patient was admitted to the hospitalist service for GI bleed.   HOSPITAL COURSE:   1.  GI bleed. The patient had 2 units of packed RBCs transfused and had an EGD done which showed mild gastritis which was followed by colonoscopy which showed no bleeding lesions. The patient had a capsule endoscopy as there was no etiology found for his GI bleed. He had 3 lesions of AVMs found. Dr. Delana Meyer of vascular surgery was consulted and would not suggest any vascular procedures. The patient's hemoglobin stayed stable over 24 hours and is being discharged home on a soft diet with Carafate liquid and Protonix and will follow up with Dr. Vira Agar of GI.  2.  Acute blood loss anemia. The patient had 2 units of packed RBCs transfused in the hospital  and his hemoglobin level on day of discharge was 8.4 and 24 hours prior was 8.   On the day of discharge, the patient did not have any further melena or bleeding, no lightheadedness, shortness of breath or fatigue. He had a blood pressure 113/62, pulse 63 and was discharged home in fair condition.   DISCHARGE MEDICATIONS:   1.  Oxybutynin 5 mg oral once a day.  2.  Pravastatin 40 mg oral once a day.  3.  Levothyroxine 100 mcg oral once a day.  4.  Fluticasone 2 sprays nasal once a day.  5.  Ferrous sulfate 325 mg 2 tablets oral once a day with meals.  6.  Dexilant 60 mg oral once a day.  7.  Atenolol 25 mg oral once a day.  8.  Multivitamin 1 tablet oral once a day.  9.  Nifedipine 60 mg oral extended release once a day.  10.  Carafate liquid 1 gram oral 3 times a day with meals.  11.  Aspirin held.   DISCHARGE INSTRUCTIONS:  Follow up with Dr. Vira Agar in a week. The patient is to be on a soft diet for 2 weeks, low salt and activity as tolerated. The patient has been advised to call his doctor or return to the Emergency Room if he notices any further melena or bleeding.   TIME SPENT ON DAY OF DISCHARGE IN DISCHARGE ACTIVITY  35 minutes   ____________________________ Leia Alf. Tacori Kvamme, MD srs:si D: 10/27/2012  14:27:00 ET T: 10/27/2012 22:10:30 ET JOB#: [id number]  cc: Alveta Heimlich R. Azariel Banik, MD, <Dictator> Kirstie Peri. Caryn Section, MD Neita Carp MD ELECTRONICALLY SIGNED 10/28/2012 13:26

## 2015-01-12 NOTE — Consult Note (Signed)
Pt CC: GI bleeding with melena.  Pt had capsule endo today, will review in early morning and check hgb tomorrow to see where we are with his origin of bleeding.  Hgb 8, plt 214  on full liquid diet.  Electronic Signatures: Manya Silvas (MD)  (Signed on 03-Feb-14 17:49)  Authored  Last Updated: 03-Feb-14 17:49 by Manya Silvas (MD)

## 2015-01-12 NOTE — Consult Note (Signed)
Pt without any bleeding today.  Hgb up to 8.4.  Preliminary info from the vascular surgeon is to observe and only do embolization if he rebleeds. Will start full liquid diet and send home and have him take carafate liquid and follow up in office in a week or so. Pt to come back quickly for a rebleed.  Electronic Signatures: Manya Silvas (MD)  (Signed on 04-Feb-14 16:29)  Authored  Last Updated: 04-Feb-14 16:29 by Manya Silvas (MD)

## 2015-01-12 NOTE — Consult Note (Signed)
CC: GI bleeding.  Pt had capsule done yesterday and I read it this morning.  He had 3 areas in small bowel that look like AVM, 2 with superficial coarse mucosa and one with darkish material distal to the AVM.  These are very likely his bleeding sites.  Discussed with him this morning.  Will consult Vascular surgery for possible arteriographic therapeutic procedure.  Electronic Signatures: Manya Silvas (MD)  (Signed on 04-Feb-14 09:28)  Authored  Last Updated: 04-Feb-14 09:28 by Manya Silvas (MD)

## 2015-01-12 NOTE — Consult Note (Signed)
EGD done for Dr. Vira Agar completely normal. Clear liquid diet ordered. Dr. Vira Agar will need to repeat colonoscopy later. THanks.  Electronic Signatures: Verdie Shire (MD)  (Signed on 31-Jan-14 11:15)  Authored  Last Updated: 31-Jan-14 11:15 by Verdie Shire (MD)

## 2015-01-12 NOTE — Consult Note (Signed)
Pt colonoscopy showed melena all way around the colon with dark green coming out the ileocecal valve.  No evidence of any bleeding area in the colon.  Prep was fair-poor and I had to lavage almost 3000cc to see to reach the cecum.  Due to absense of bleeding location I did an EGD to double check for possible AVM than might have blanched with EGD yesterday.  It was normal except for minimal gastiritis.  Hgb 8.7.  Will watch him on clear liquids and do capsule endo Monday.  If acute bleeding occurs again will get stat GI bleeding scan.  If hgb falls below 8 will ABSOLUTELY need transfusion.  Dr Gustavo Lah to see tomorrow and I will return Monday,  Electronic Signatures: Manya Silvas (MD)  (Signed on 01-Feb-14 12:31)  Authored  Last Updated: 01-Feb-14 12:31 by Manya Silvas (MD)

## 2015-01-12 NOTE — Consult Note (Signed)
Pt CC: GI bleed with anemia and melena.  Hgb came up from mid 6 to 9 after 2 units. VSS.  Dr. Candace Cruise has agreed to do his EGD as my schedule is full. Patient is OK with that.  He was seen in office yesterday on referral for anemia and several days of melena.  Hx of AVM in colon which were not cauterized because at that time there was no hx of blood loss or anemia.  If EGD is neg will need repeat colonoscopy.    Electronic Signatures: Manya Silvas (MD)  (Signed on 31-Jan-14 07:54)  Authored  Last Updated: 31-Jan-14 07:54 by Manya Silvas (MD)

## 2015-01-12 NOTE — Consult Note (Signed)
Chief Complaint:   Subjective/Chief Complaint seen for GI bleeding.  no bm today, denies abdominal pain or nausea.   VITAL SIGNS/ANCILLARY NOTES: **Vital Signs.:   02-Feb-14 13:20   Vital Signs Type Routine   Temperature Temperature (F) 98.1   Celsius 36.7   Temperature Source Oral   Pulse Pulse 73   Respirations Respirations 20   Systolic BP Systolic BP 250   Diastolic BP (mmHg) Diastolic BP (mmHg) 44   Mean BP 65   Pulse Ox % Pulse Ox % 97   Pulse Ox Activity Level  At rest   Oxygen Delivery Room Air/ 21 %   Brief Assessment:   Cardiac Regular    Respiratory clear BS    Gastrointestinal details normal Soft  Nontender  Nondistended  No masses palpable  Bowel sounds normal   Lab Results: Routine Hem:  30-Jan-14 17:47    Hemoglobin (CBC)  7.2  31-Jan-14 03:58    Hemoglobin (CBC)  9.3    12:25    Hemoglobin (CBC)  9.2  01-Feb-14 04:27    Hemoglobin (CBC)  8.7 (Result(s) reported on 23 Oct 2012 at 05:19AM.)  02-Feb-14 04:25    Hemoglobin (CBC)  9.2   Assessment/Plan:  Assessment/Plan:   Assessment 1) GI blood loss of uncertain etiology.  Stable s/p tfx.  no repeat bm today.  EGD and colonoscopy uninformative as to source.    Plan 1)  for Video capsule endoscopy tomorrow.  will repeat prep tonight.  Dr Vira Agar to see tomorrow.   Electronic Signatures: Loistine Simas (MD)  (Signed 02-Feb-14 17:20)  Authored: Chief Complaint, VITAL SIGNS/ANCILLARY NOTES, Brief Assessment, Lab Results, Assessment/Plan   Last Updated: 02-Feb-14 17:20 by Loistine Simas (MD)

## 2015-01-12 NOTE — Consult Note (Signed)
Pt had melena and fall in hgb, EGD was neg.  Plan colonoscopy tomorrow morning at 10:30 am. Prep today this afternoon.  Check hgb in morning.  Electronic Signatures: Manya Silvas (MD)  (Signed on 31-Jan-14 17:26)  Authored  Last Updated: 31-Jan-14 17:26 by Manya Silvas (MD)

## 2015-01-12 NOTE — H&P (Signed)
PATIENT NAME:  Roger Cook, Roger Cook MR#:  [id number] DATE OF BIRTH:  16-Jan-1927  DATE OF ADMISSION:  10/21/2012  PRIMARY CARE PHYSICIAN:  Dr. Caryn Section.   PRIMARY GASTROENTEROLOGIST:  Dr. Vira Agar.   PRIMARY CARDIOLOGIST:  Dr. Clayborn Bigness.   CHIEF COMPLAINT:  Black-colored stool.   HISTORY OF PRESENT ILLNESS:  This is an 81 year old male with past medical history of hypertension, coronary artery disease, hyperlipidemia, prostate cancer status post prostatectomy, coronary artery disease, chronic renal insufficiency, was in his usual state of health until last week, then he started noting some black-colored stool and he called his primary care physician, Dr. Caryn Section.  He referred him to Dr. Vira Agar.  Dr. Vira Agar saw him today and he did the blood work.  He found his hemoglobin is 6.7 which is a significant drop from his baseline which was around 12 in the past and so he told him to go to the Emergency Room and he will do further work-up after he receives blood transfusion in in-patient tomorrow.  As per the patient his colonoscopy was done in October 2013 which was normal by Dr. Vira Agar and so Dr. Vira Agar told him to go to Emergency Room, get the blood transfusion, possibly he might do the endoscopy to rule out the cause of his GI bleed.   REVIEW OF SYSTEMS:  CONSTITUTIONAL:  Negative for fever, fatigue, weakness, pain or weight loss.  EYES:  No blurring, double vision, pain or redness.  EARS, NOSE, THROAT:  No tinnitus, ear pain or hearing loss.  RESPIRATORY:  No cough, wheezing, hemoptysis or dyspnea.  CARDIOVASCULAR:  No chest pain, orthopnea, arrhythmia or palpitations.  GASTROINTESTINAL:  No nausea, vomiting, diarrhea or abdominal pain.  He had some complaint of constipation and he was taking some stool softener before, but the last 4 days he had not taken any.  The complaint of black-colored stool which is once per day, but there is no complaint of diarrhea or bright red blood per rectum.  GENITOURINARY:   No dysuria, hematuria or increased frequency of the urination.  ENDOCRINE:  No heat or cold intolerance.  No polyuria. INTEGUMENTARY:  No acne or rash on the skin.  NEUROLOGICAL:  No numbness, weakness, headache or tremors.  PSYCHIATRIC:  No anxiety, insomnia or bipolar disorder.   PAST MEDICAL HISTORY:  Positive for chronic renal insufficiency, creatinine baseline is 1.4 to 1.5, hyperlipidemia, hypertension, coronary artery disease and prostate cancer status post prostatectomy.   PAST SURGICAL HISTORY:  For prostate cancer, radical prostatectomy was done in 1975, history of penile implant, history of laparoscopic appendectomy in 2005, bilateral cataract surgery and implant and left eye macular hole status post vitrectomy in the past and surgery for chronic pansinusitis, ethmoidectomy and frontal sinus operations as well as maxillary sinus surgeries.   ALLERGIES:  BENADRYL, SULFA, RADIOCONTRAST DYE, LEVOFLOXACIN.   SOCIAL HISTORY:  Married.  Lives with wife.  He stopped smoking in 1954 and he drinks 2 glasses of wine per week.  Denies any drug use.  He is a retired Building services engineer in a company.  HOME MEDICATIONS:  1.  Aspirin 81 mg once a day.  2.  Atenolol 25 mg once a day.   3.  Dexilant 60 mg oral delayed-release capsule once a day.  4.  Docusate, senna 50 mg/8.6 mg oral tablet 4 tablets orally 2 times a day as needed for constipation.  5.  Ferrous sulfate 325 mg oral 2 times a day.  6.  Fluticasone two sprays nasally once a  day.  7.  Levothyroxine 100 mcg oral tablet once a day.  8.  Multivitamin once a day.  9.  Nifedipine 60 mg oral tablet extended release once a day.  10.  Nitrofurantoin 100 mg oral capsule once a day as directed. 11.  Oxybutynin 5 mg oral tablet once a day.  12.  Pravastatin 40 mg oral tablet once a day.   FAMILY HISTORY:  Significant for lung cancer in mother.   PHYSICAL EXAMINATION:  VITAL SIGNS:  Temperature 98, pulse rate 70, respirations 20, blood  pressure 151/62 which came up to 160/80 and pulse ox 98% on room air.  GENERAL:  He is fully alert, oriented to time, place and person, in no acute distress.  He is cooperative with history-taking and physical examination.  HEAD AND NECK:  Conjunctivae pale.  Oral mucosa moist.  No icterus.  Hearing grossly intact.  Neck supple.  No JVD.  CARDIOVASCULAR:  S1, S2 present, regular.  No murmur.  RESPIRATORY:  Bilateral clear and equal air entry.  ABDOMEN:  Soft, nontender.  No organomegaly.  Bowel sounds normal.  SKIN:  No rashes.  JOINTS:  No swelling or tenderness.  NEUROLOGICAL:  Grossly intact.  Power V/V all four limbs.  LEGS:  No edema.   PSYCHIATRIC:  Does not appear in any acute psychiatric illness at this point of time.   LABORATORY RESULTS:  Glucose 116, BUN 32, creatinine 1.48, sodium 142, potassium 4.4, chloride 113, CO2 20, calcium 8.4, total protein 7.2, albumin 3.6, bilirubin 0.2, alkaline phosphate 64, SGOT 24, SGPT 22.  Troponin less than 0.02.  WBC 7.5, hemoglobin 7.2, in the past in July 2013 hemoglobin was 12.6.  Platelets 275.  Urinalysis grossly negative.   ASSESSMENT AND PLAN:  An 81 year old male with a past medical history of chronic renal failure, hyperlipidemia, hypertension and coronary artery disease, was taking aspirin regularly for his coronary artery disease, has been having black-colored stool for the last 5 to 6 days and went to see gastroenterologist Dr. Vira Agar today.  After blood work, sent to the ER for blood transfusion and for further work-up.  1.  Gastrointestinal bleed, possibly upper, maybe gastric ulcer, is on aspirin and colonoscopy was normal a few months ago as per the patient.  He is following with Dr. Vira Agar, nothing by mouth except meds.  Nexium IV twice a day.  Stop aspirin.  No anticoagulant.  CBC 8 hourly, 2 units PRBC ordered by ER.  Dr. Vira Agar to follow for endoscopy or further work-up.  2.  Hypertension.  Blood pressure is higher currently, so we  will continue his home medication.  3.  Chronic renal failure, stable.  Advised to follow with nephro clinic after discharge.  4.  Hyperlipidemia.  We will continue statin.  5.  Prostate cancer, status post surgery.  We will continue oxybutynin.  6.  Coronary artery disease, was on aspirin.  We will hold it.  As per him, he followed with Dr. Clayborn Bigness and his echo was normal.  He cleared for colonoscopy in the past.  7.  Deep vein thrombosis prophylaxis.  We will give TED plus SCD.  Not give any anticoagulation due to gastrointestinal bleed.  8.  Gastrointestinal prophylaxis.  He is on full treatment for gastrointestinal bleed currently.  CODE STATUS:  FULL CODE.   TOTAL TIME SPENT:  Fifty-five minutes.     ____________________________ Ceasar Lund Anselm Jungling, MD vgv:ea D: 10/21/2012 22:39:19 ET T: 10/22/2012 01:25:54 ET JOB#: [id number]  cc: Ceasar Lund.  Anselm Jungling, MD, <Dictator> Vaughan Basta MD ELECTRONICALLY SIGNED 11/12/2012 18:01

## 2015-01-14 NOTE — Op Note (Signed)
PATIENT NAME:  Roger Cook, Roger Cook MR#:  [id number] DATE OF BIRTH:  1926/12/04  DATE OF PROCEDURE:  03/23/2012  PREOPERATIVE DIAGNOSIS: Bladder mass.   POSTOPERATIVE DIAGNOSIS: Bladder mass.   PROCEDURE: Cystoscopy, bladder biopsy.   SURGEON: Edrick Oh, M.D.   ANESTHESIA: Laryngeal mask airway anesthesia.   INDICATIONS: The patient is an 81 year old gentleman with a prior history of radical prostatectomy and subsequent radiation therapy. He has been followed for abnormal bladder mucosa. There has been complete resolution of the previously noted abnormalities throughout most of the bladder. He was noted to have an 8-mm area of raised, irregular, papillary-appearing tissue with central hypervascularity in each papillary component at the level of the bladder neck. This was approximately 8 mm in size. It is visually consistent with early transitional cell carcinoma. He presents for bladder biopsy.   PROCEDURE: After informed consent was obtained, the patient was taken to the operating room and placed in the dorsal lithotomy position under laryngeal mask airway anesthesia. The patient was then prepped and draped in the usual standard fashion. The 56 French rigid cystoscope was introduced into the urethra under direct vision with no urethral abnormalities noted. Care was taken due to the presence of a penile prosthesis. No problems were encountered. Upon entering the level of the bladder neck just over the edge of the bladder neck, the area of irregular, papillary-appearing tissue was appreciated. It was linear in nature and approximately 8 mm. The bladder was then inspected in its entirety. Areas of scar and reconstruction about the level of the trigone were identified. The bilateral ureteral orifices were very medial and atypical in appearance, also consistent with reconstruction. Prior areas of biopsy and scar were noted within the bladder. These have completely healed. Cold cup biopsy forceps were then  utilized. A single large biopsy was taken of the papillary-appearing tissue. The area was then cauterized utilizing the Bugbee electrode. No significant bleeding was encountered. The bladder was drained. The cystoscope was removed. A 14 French coude catheter was then placed with 40 mL of 2% lidocaine instilled into the urinary bladder. The catheter was then removed. The patient was returned to the supine position and awakened from laryngeal mask airway anesthesia. He was taken to the recovery room in stable condition. There were no problems or complications. The patient tolerated the procedure well.     ____________________________ Denice Bors. Jacqlyn Larsen, MD bsc:bjt D: 03/23/2012 09:36:55 ET T: 03/23/2012 12:37:48 ET JOB#: [id number]  cc: Denice Bors. Jacqlyn Larsen, MD, <Dictator> Denice Bors Gene Glazebrook MD ELECTRONICALLY SIGNED 03/23/2012 14:29

## 2015-01-14 NOTE — Op Note (Signed)
PATIENT NAME:  Roger Cook, Roger Cook MR#:  [id number] DATE OF BIRTH:  02-01-1927  DATE OF PROCEDURE:  10/14/2011  PREOPERATIVE DIAGNOSIS: Bladder neoplasm, uncertain.   POSTOPERATIVE DIAGNOSIS: Bladder neoplasm, uncertain.   PROCEDURES: Cystoscopy, bladder biopsy.   SURGEON: Edrick Oh, M.D.   ANESTHESIA: Laryngeal mask airway anesthesia.   INDICATIONS: The patient is an 81 year old white gentleman with a history of irritative voiding symptoms. He has a history of prostate cancer status post radical prostatectomy and subsequent radiation therapy. He has been experiencing increased irritative voiding symptoms and urinary incontinence. Recent cystoscopy demonstrated an area of raised irregular mucosa worrisome for early papillary transitional cell carcinoma on the left bladder base just behind the ureteral orifice. A second area of erythematous mucosa was noted on the posterior bladder wall. He presents for cystoscopy and bladder biopsy.   DESCRIPTION OF PROCEDURE: After informed consent was obtained, the patient was taken to the Operating Room and placed in the dorsal lithotomy position under laryngeal mask airway anesthesia. The patient was then prepped and draped in the usual standard fashion. The 36 French rigid cystoscope was introduced into the urethra under direct vision with no urethral abnormalities noted. Care was taken due to the presence of a semirigid penile prosthesis. The prostate was noted to be surgically absent. The bladder neck was open. Some disfiguration of the trigone region was appreciated. Both ureteral orifices are more predominant on the center and right and are in very close proximity just behind the left ureteral orifice on the left bladder base. An approximate 8 mm to 10 mm area of raised, erythematous early papillary appearing mucosa with hypervascularity throughout the region was appreciated. These findings are high suspicious for early transitional cell carcinoma. The second  area on the posterior bladder wall was more erythematous in nature. It did not have the papillary-appearing features. Cold cup biopsy forceps were utilized to obtain biopsies of the left bladder base and posterior bladder wall. The areas were then cauterized extensively utilizing a Bugbee electrode. No significant bleeding was encountered. The specimens were sent to pathology for further evaluation. The bladder was drained. The cystoscope was removed. An 48 French red rubber catheter was then placed. 40 mL of 2% lidocaine was instilled into the urinary bladder. The catheter was removed. The patient was returned to the supine position and awakened from laryngeal mask airway anesthesia. The patient was taken to the Recovery Room in stable condition. There were no problems or complications. The patient tolerated the procedure well. ____________________________ Denice Bors. Jacqlyn Larsen, MD bsc:slb D: 10/14/2011 10:13:22 ET T: 10/14/2011 10:26:44 ET JOB#: [id number]  cc: Denice Bors. Jacqlyn Larsen, MD, <Dictator> Denice Bors Benito Lemmerman MD ELECTRONICALLY SIGNED 10/21/2011 7:10

## 2015-01-29 DIAGNOSIS — D044 Carcinoma in situ of skin of scalp and neck: Secondary | ICD-10-CM | POA: Diagnosis not present

## 2015-02-26 DIAGNOSIS — G510 Bell's palsy: Secondary | ICD-10-CM | POA: Insufficient documentation

## 2015-02-26 DIAGNOSIS — D179 Benign lipomatous neoplasm, unspecified: Secondary | ICD-10-CM | POA: Insufficient documentation

## 2015-02-26 DIAGNOSIS — Z1211 Encounter for screening for malignant neoplasm of colon: Secondary | ICD-10-CM | POA: Insufficient documentation

## 2015-02-26 DIAGNOSIS — Z1331 Encounter for screening for depression: Secondary | ICD-10-CM | POA: Insufficient documentation

## 2015-02-26 DIAGNOSIS — B351 Tinea unguium: Secondary | ICD-10-CM | POA: Insufficient documentation

## 2015-02-26 DIAGNOSIS — Z8614 Personal history of Methicillin resistant Staphylococcus aureus infection: Secondary | ICD-10-CM | POA: Insufficient documentation

## 2015-02-26 DIAGNOSIS — D303 Benign neoplasm of bladder: Secondary | ICD-10-CM | POA: Insufficient documentation

## 2015-02-26 DIAGNOSIS — Z8546 Personal history of malignant neoplasm of prostate: Secondary | ICD-10-CM | POA: Insufficient documentation

## 2015-02-26 DIAGNOSIS — R55 Syncope and collapse: Secondary | ICD-10-CM | POA: Insufficient documentation

## 2015-02-26 DIAGNOSIS — L739 Follicular disorder, unspecified: Secondary | ICD-10-CM | POA: Insufficient documentation

## 2015-02-27 ENCOUNTER — Ambulatory Visit (INDEPENDENT_AMBULATORY_CARE_PROVIDER_SITE_OTHER): Payer: Medicare Other | Admitting: Family Medicine

## 2015-02-27 ENCOUNTER — Encounter: Payer: Self-pay | Admitting: Family Medicine

## 2015-02-27 ENCOUNTER — Ambulatory Visit
Admission: RE | Admit: 2015-02-27 | Discharge: 2015-02-27 | Disposition: A | Discharge status: Home / Self Care | Payer: Medicare Other | Source: Ambulatory Visit | Attending: Family Medicine | Admitting: Family Medicine

## 2015-02-27 VITALS — BP 124/60 | HR 58 | Temp 98.0°F | Resp 16 | Wt 149.0 lb

## 2015-02-27 DIAGNOSIS — W19XXXA Unspecified fall, initial encounter: Secondary | ICD-10-CM | POA: Diagnosis not present

## 2015-02-27 DIAGNOSIS — R072 Precordial pain: Secondary | ICD-10-CM | POA: Diagnosis not present

## 2015-02-27 DIAGNOSIS — R0781 Pleurodynia: Secondary | ICD-10-CM | POA: Diagnosis not present

## 2015-02-27 DIAGNOSIS — R0602 Shortness of breath: Secondary | ICD-10-CM | POA: Insufficient documentation

## 2015-02-27 DIAGNOSIS — S299XXA Unspecified injury of thorax, initial encounter: Secondary | ICD-10-CM | POA: Diagnosis not present

## 2015-02-27 DIAGNOSIS — Z1389 Encounter for screening for other disorder: Secondary | ICD-10-CM

## 2015-02-27 DIAGNOSIS — Z87891 Personal history of nicotine dependence: Secondary | ICD-10-CM | POA: Diagnosis not present

## 2015-02-27 DIAGNOSIS — W11XXXA Fall on and from ladder, initial encounter: Secondary | ICD-10-CM

## 2015-02-27 DIAGNOSIS — Y92009 Unspecified place in unspecified non-institutional (private) residence as the place of occurrence of the external cause: Secondary | ICD-10-CM | POA: Insufficient documentation

## 2015-02-27 DIAGNOSIS — Z8551 Personal history of malignant neoplasm of bladder: Secondary | ICD-10-CM | POA: Diagnosis not present

## 2015-02-27 DIAGNOSIS — R0789 Other chest pain: Secondary | ICD-10-CM

## 2015-02-27 DIAGNOSIS — Y939 Activity, unspecified: Secondary | ICD-10-CM | POA: Diagnosis not present

## 2015-02-27 DIAGNOSIS — I2510 Atherosclerotic heart disease of native coronary artery without angina pectoris: Secondary | ICD-10-CM | POA: Insufficient documentation

## 2015-02-27 NOTE — Progress Notes (Signed)
Patient: Roger Cook Male    DOB: 09-Aug-1927   81 y.o.   MRN: [id number] Visit Date: 02/27/2015  Today's Provider: Lelon Huh, MD   Chief Complaint  Patient presents with  . Fall    with ladder   Subjective:    Fall The accident occurred 12 to 24 hours ago. The fall occurred from a ladder. He fell from a height of 6 to 10 ft. He landed on grass. The volume of blood lost was minimal. The point of impact was the face, left shoulder and left wrist. The pain is present in the face, nose, neck, chin, back, left wrist and left hand (Chest left side). The pain is at a severity of 8/10. The pain is moderate. The symptoms are aggravated by rotation. Pertinent negatives include no abdominal pain, headaches, numbness, tingling or visual change. He has tried NSAID for the symptoms. The treatment provided mild relief.  Was cleaning out gutters when ladder fell over and landed in pile of pine needles on the ground. Left ribs are painful when twisting. No shortness of breath, but left ear is popping. No LOC.    Previous Medications   ARTIFICIAL TEAR SOLUTION OP    Apply to eye.   ASPIRIN 81 MG TABLET    Take by mouth.   ATENOLOL (TENORMIN) 25 MG TABLET    Take by mouth.   FLUTICASONE (FLONASE) 50 MCG/ACT NASAL SPRAY    Place into the nose.   IBUPROFEN PO    Take by mouth 2 (two) times daily.   LEVOTHYROXINE (SYNTHROID, LEVOTHROID) 100 MCG TABLET    Take by mouth.   LORATADINE (CLARITIN) 10 MG TABLET    Take by mouth.   MULTIPLE VITAMIN PO    Take by mouth.   NEOMYCIN-BACITRACIN-POLYMYXIN (NEOSPORIN EX)    Apply topically.   NIFEDIPINE (PROCARDIA-XL/ADALAT CC) 60 MG 24 HR TABLET    Take by mouth.   NITROFURANTOIN, MACROCRYSTAL-MONOHYDRATE, (MACROBID) 100 MG CAPSULE    Take by mouth.   NON FORMULARY    CPAP (Free Text) - Historical Medication  As directed  Started 22-Sep-1994 Active   OXYBUTYNIN (DITROPAN-XL) 5 MG 24 HR TABLET    Take by mouth.   PRAVASTATIN (PRAVACHOL) 40 MG TABLET    Take by  mouth.   TERBINAFINE (LAMISIL) 250 MG TABLET    Take by mouth.    Review of Systems  HENT:       Left ear popping sound  Eyes: Positive for discharge.  Cardiovascular: Negative for chest pain, palpitations and leg swelling.  Gastrointestinal: Negative for abdominal pain.  Skin: Positive for wound.  Neurological: Negative for tingling, numbness and headaches.    History  Substance Use Topics  . Smoking status: Former Research scientist (life sciences)  . Smokeless tobacco: Not on file  . Alcohol Use: 0.0 oz/week    0 Standard drinks or equivalent per week   Objective:   BP 124/60 mmHg  Pulse 58  Temp(Src) 98 F (36.7 C) (Oral)  Resp 16  Wt 149 lb (67.586 kg)  SpO2 98%  Physical Exam Several minor abrasions noted on nose, forehead, cheeks.  Some bruising and tenderness noted anterior chest wall and left lateral rib cage.  Lungs CTA     Assessment & Plan:     1. Chest wall pain  - DG Sternum; Future - DG Ribs Unilateral Left; Future  2. Fall from ladder, initial encounter     Follow up: No Follow-up on file.

## 2015-03-01 DIAGNOSIS — E784 Other hyperlipidemia: Secondary | ICD-10-CM | POA: Diagnosis not present

## 2015-03-01 DIAGNOSIS — I2510 Atherosclerotic heart disease of native coronary artery without angina pectoris: Secondary | ICD-10-CM | POA: Diagnosis not present

## 2015-03-01 DIAGNOSIS — I209 Angina pectoris, unspecified: Secondary | ICD-10-CM | POA: Diagnosis not present

## 2015-03-01 DIAGNOSIS — I10 Essential (primary) hypertension: Secondary | ICD-10-CM | POA: Diagnosis not present

## 2015-03-06 DIAGNOSIS — Z23 Encounter for immunization: Secondary | ICD-10-CM | POA: Diagnosis not present

## 2015-03-06 DIAGNOSIS — S61211A Laceration without foreign body of left index finger without damage to nail, initial encounter: Secondary | ICD-10-CM | POA: Diagnosis not present

## 2015-03-16 DIAGNOSIS — Z4802 Encounter for removal of sutures: Secondary | ICD-10-CM | POA: Diagnosis not present

## 2015-04-26 DIAGNOSIS — H40052 Ocular hypertension, left eye: Secondary | ICD-10-CM | POA: Diagnosis not present

## 2015-05-07 DIAGNOSIS — Z85828 Personal history of other malignant neoplasm of skin: Secondary | ICD-10-CM | POA: Diagnosis not present

## 2015-05-07 DIAGNOSIS — X32XXXA Exposure to sunlight, initial encounter: Secondary | ICD-10-CM | POA: Diagnosis not present

## 2015-05-07 DIAGNOSIS — L570 Actinic keratosis: Secondary | ICD-10-CM | POA: Diagnosis not present

## 2015-05-07 DIAGNOSIS — L821 Other seborrheic keratosis: Secondary | ICD-10-CM | POA: Diagnosis not present

## 2015-06-26 ENCOUNTER — Other Ambulatory Visit: Payer: Self-pay | Admitting: Family Medicine

## 2015-07-04 ENCOUNTER — Ambulatory Visit (INDEPENDENT_AMBULATORY_CARE_PROVIDER_SITE_OTHER): Payer: Medicare Other

## 2015-07-04 DIAGNOSIS — Z23 Encounter for immunization: Secondary | ICD-10-CM

## 2015-07-17 ENCOUNTER — Telehealth: Payer: Self-pay

## 2015-07-17 NOTE — Telephone Encounter (Signed)
Roger Cook

## 2015-07-31 DIAGNOSIS — M5412 Radiculopathy, cervical region: Secondary | ICD-10-CM | POA: Diagnosis not present

## 2015-08-27 ENCOUNTER — Other Ambulatory Visit: Payer: Self-pay | Admitting: Family Medicine

## 2015-10-18 ENCOUNTER — Other Ambulatory Visit: Payer: Self-pay | Admitting: Family Medicine

## 2015-10-18 DIAGNOSIS — H40052 Ocular hypertension, left eye: Secondary | ICD-10-CM | POA: Diagnosis not present

## 2015-10-18 DIAGNOSIS — I10 Essential (primary) hypertension: Secondary | ICD-10-CM

## 2015-10-31 DIAGNOSIS — N3020 Other chronic cystitis without hematuria: Secondary | ICD-10-CM | POA: Diagnosis not present

## 2015-10-31 DIAGNOSIS — N393 Stress incontinence (female) (male): Secondary | ICD-10-CM | POA: Diagnosis not present

## 2015-10-31 DIAGNOSIS — Z8546 Personal history of malignant neoplasm of prostate: Secondary | ICD-10-CM | POA: Diagnosis not present

## 2015-10-31 DIAGNOSIS — Z6822 Body mass index (BMI) 22.0-22.9, adult: Secondary | ICD-10-CM | POA: Diagnosis not present

## 2015-10-31 DIAGNOSIS — D303 Benign neoplasm of bladder: Secondary | ICD-10-CM | POA: Diagnosis not present

## 2015-11-06 ENCOUNTER — Ambulatory Visit (INDEPENDENT_AMBULATORY_CARE_PROVIDER_SITE_OTHER): Payer: Medicare Other | Admitting: Family Medicine

## 2015-11-06 ENCOUNTER — Encounter: Payer: Self-pay | Admitting: Family Medicine

## 2015-11-06 ENCOUNTER — Other Ambulatory Visit: Payer: Self-pay | Admitting: Family Medicine

## 2015-11-06 VITALS — BP 130/70 | HR 16 | Temp 98.1°F | Resp 16 | Ht 66.0 in | Wt 149.0 lb

## 2015-11-06 DIAGNOSIS — E039 Hypothyroidism, unspecified: Secondary | ICD-10-CM

## 2015-11-06 DIAGNOSIS — I2510 Atherosclerotic heart disease of native coronary artery without angina pectoris: Secondary | ICD-10-CM | POA: Diagnosis not present

## 2015-11-06 DIAGNOSIS — Z0000 Encounter for general adult medical examination without abnormal findings: Secondary | ICD-10-CM

## 2015-11-06 DIAGNOSIS — I10 Essential (primary) hypertension: Secondary | ICD-10-CM | POA: Diagnosis not present

## 2015-11-06 NOTE — Patient Instructions (Signed)
Fall Prevention in the Home  Falls can cause injuries and can affect people from all age groups. There are many simple things that you can do to make your home safe and to help prevent falls. WHAT CAN I DO ON THE OUTSIDE OF MY HOME?  Regularly repair the edges of walkways and driveways and fix any cracks.  Remove high doorway thresholds.  Trim any shrubbery on the main path into your home.  Use bright outdoor lighting.  Clear walkways of debris and clutter, including tools and rocks.  Regularly check that handrails are securely fastened and in good repair. Both sides of any steps should have handrails.  Install guardrails along the edges of any raised decks or porches.  Have leaves, snow, and ice cleared regularly.  Use sand or salt on walkways during winter months.  In the garage, clean up any spills right away, including grease or oil spills. WHAT CAN I DO IN THE BATHROOM?  Use night lights.  Install grab bars by the toilet and in the tub and shower. Do not use towel bars as grab bars.  Use non-skid mats or decals on the floor of the tub or shower.  If you need to sit down while you are in the shower, use a plastic, non-slip stool..  Keep the floor dry. Immediately clean up any water that spills on the floor.  Remove soap buildup in the tub or shower on a regular basis.  Attach bath mats securely with double-sided non-slip rug tape.  Remove throw rugs and other tripping hazards from the floor. WHAT CAN I DO IN THE BEDROOM?  Use night lights.  Make sure that a bedside light is easy to reach.  Do not use oversized bedding that drapes onto the floor.  Have a firm chair that has side arms to use for getting dressed.  Remove throw rugs and other tripping hazards from the floor. WHAT CAN I DO IN THE KITCHEN?   Clean up any spills right away.  Avoid walking on wet floors.  Place frequently used items in easy-to-reach places.  If you need to reach for something  above you, use a sturdy step stool that has a grab bar.  Keep electrical cables out of the way.  Do not use floor polish or wax that makes floors slippery. If you have to use wax, make sure that it is non-skid floor wax.  Remove throw rugs and other tripping hazards from the floor. WHAT CAN I DO IN THE STAIRWAYS?  Do not leave any items on the stairs.  Make sure that there are handrails on both sides of the stairs. Fix handrails that are broken or loose. Make sure that handrails are as long as the stairways.  Check any carpeting to make sure that it is firmly attached to the stairs. Fix any carpet that is loose or worn.  Avoid having throw rugs at the top or bottom of stairways, or secure the rugs with carpet tape to prevent them from moving.  Make sure that you have a light switch at the top of the stairs and the bottom of the stairs. If you do not have them, have them installed. WHAT ARE SOME OTHER FALL PREVENTION TIPS?  Wear closed-toe shoes that fit well and support your feet. Wear shoes that have rubber soles or low heels.  When you use a stepladder, make sure that it is completely opened and that the sides are firmly locked. Have someone hold the ladder while you   are using it. Do not climb a closed stepladder.  Add color or contrast paint or tape to grab bars and handrails in your home. Place contrasting color strips on the first and last steps.  Use mobility aids as needed, such as canes, walkers, scooters, and crutches.  Turn on lights if it is dark. Replace any light bulbs that burn out.  Set up furniture so that there are clear paths. Keep the furniture in the same spot.  Fix any uneven floor surfaces.  Choose a carpet design that does not hide the edge of steps of a stairway.  Be aware of any and all pets.  Review your medicines with your healthcare provider. Some medicines can cause dizziness or changes in blood pressure, which increase your risk of falling. Talk  with your health care provider about other ways that you can decrease your risk of falls. This may include working with a physical therapist or trainer to improve your strength, balance, and endurance.   This information is not intended to replace advice given to you by your health care provider. Make sure you discuss any questions you have with your health care provider.   Document Released: 08/29/2002 Document Revised: 01/23/2015 Document Reviewed: 10/13/2014 Elsevier Interactive Patient Education 2016 [employer]  

## 2015-11-06 NOTE — Progress Notes (Signed)
Patient: Roger Cook, Male    DOB: 16-Oct-1926, 82 y.o.   MRN: [id number] Visit Date: 11/06/2015  Today's Provider: Lelon Huh, MD   Chief Complaint  Patient presents with  . Medicare Wellness  . Hypothyroidism  . Hypertension   Subjective:    Annual wellness visit Roger Cook is a 82 y.o. male. He feels well. He reports exercising walking. He reports he is sleeping well.  -----------------------------------------------------------  Follow-up for arteriosclerosis of coronary artery from 08/29/2014; no changes. Follow-up for acquired hypothyroidism from 08/29/2014; no changes.   Continues to see Dr. Jacqlyn Larsen yearly for history of prostate cancer. Has follow up with Dr. Clayborn Bigness for CAD in June.      Hypertension, follow-up:  BP Readings from Last 3 Encounters:  11/06/15 130/70  02/27/15 124/60  12/26/14 146/60    He was last seen for hypertension 08/29/2014.  BP at that visit was 122/62. Management since that visit includes; no changes.He reports good compliance with treatment. He is not having side effects. none  He is exercising. He is adherent to low salt diet.   Outside blood pressures are 130/50. He is experiencing none.  Patient denies none.   Cardiovascular risk factors include none.  Use of agents associated with hypertension: none.   ----------------------------------------------------------------------    Lipid/Cholesterol, Follow-up:   Last seen for this 08/29/2014.  Management since that visit includes; no changes.  Last Lipid Panel:    Component Value Date/Time   CHOL 132 08/30/2014   TRIG 135 08/30/2014   HDL 38 08/30/2014   LDLCALC 67 08/30/2014    He reports good compliance with treatment. He is not having side effects. none  Wt Readings from Last 3 Encounters:  11/06/15 149 lb (67.586 kg)  02/27/15 149 lb (67.586 kg)  12/26/14 148 lb (67.132 kg)     ----------------------------------------------------------------------       Review of Systems  Constitutional: Negative.   HENT: Negative.   Eyes: Negative.   Respiratory: Negative.   Cardiovascular: Positive for chest pain.       Pain in the chest while walking 2 miles-after eating occasionally  Gastrointestinal: Negative.   Endocrine: Negative.   Genitourinary: Negative.   Musculoskeletal: Positive for myalgias.  Skin: Negative.   Allergic/Immunologic: Negative.   Neurological: Negative.   Hematological: Negative.   Psychiatric/Behavioral: Negative.     Social History   Social History  . Marital Status: Married    Spouse Name: N/A  . Number of Children: N/A  . Years of Education: N/A   Occupational History  . Not on file.   Social History Main Topics  . Smoking status: Former Research scientist (life sciences)  . Smokeless tobacco: Not on file  . Alcohol Use: 0.0 oz/week    0 Standard drinks or equivalent per week  . Drug Use: No  . Sexual Activity: Not on file   Other Topics Concern  . Not on file   Social History Narrative    Past Medical History  Diagnosis Date  . Anemia   . Thyroid disease     hypothyroidism  . Hypertension   . MRSA bacteremia 02/2011  . Bell palsy   . Bladder tumor   . Lipoma   . Hyperlipidemia   . Sleep apnea   . CAD (coronary artery disease)      Patient Active Problem List   Diagnosis Date Noted  . Bell's palsy 02/26/2015  . Neoplasm of bladder 02/26/2015  . Special screening for malignant neoplasms,  colon 02/26/2015  . Screening for depression 02/26/2015  . Folliculitis [id number]  . Personal history of methicillin resistant Staphylococcus aureus 02/26/2015  . Fatty tumor 02/26/2015  . Syncope and collapse 02/26/2015  . Fungal infection of nail 02/26/2015  . H/O malignant neoplasm of prostate 02/26/2015  . Altered blood in stool 10/18/2012  . Arteriosclerosis of coronary artery 01/05/2009  . Apnea, sleep 01/05/2009  . Acquired  hypothyroidism 01/05/2009  . BP (high blood pressure) 09/22/1998    Past Surgical History  Procedure Laterality Date  . Hand surgery Left 06/26/2011    Malignancy removed from left hand  . Eye surgery  2005    cataract surgery, also had a macular hole in 2005  . Eye surgery  2008    catarac surgery  . Appendectomy  2005  . Polyp of rectum  2003  . Nasal sinus surgery  1990  . Nasal septum surgery  1986  . Penile inplant  1984  . Prostate surgery  1975    Abdominal, had to have radiation treatment with the procedure  . Tonsillectomy      His family history includes Heart disease in his father; Lung cancer in his mother.    Previous Medications   ARTIFICIAL TEAR SOLUTION OP    Apply to eye.   ASPIRIN 81 MG TABLET    Take by mouth.   ATENOLOL (TENORMIN) 25 MG TABLET    TAKE ONE TABLET EVERY DAY   BRIMONIDINE-TIMOLOL (COMBIGAN) 0.2-0.5 % OPHTHALMIC SOLUTION    Place 1 drop into both eyes every 12 (twelve) hours.   FLUTICASONE (FLONASE) 50 MCG/ACT NASAL SPRAY    1 OLR 2 SPRAYS IN EACH NOSTRIL EVERY DAYAS NEEDED   IBUPROFEN PO    Take by mouth 2 (two) times daily. Reported on 11/06/2015   LEVOTHYROXINE (SYNTHROID, LEVOTHROID) 100 MCG TABLET    TAKE ONE TABLET EVERY DAY   LORATADINE (CLARITIN) 10 MG TABLET    Take by mouth. Reported on 11/06/2015   MULTIPLE VITAMIN PO    Take by mouth.   NEOMYCIN-BACITRACIN-POLYMYXIN (NEOSPORIN EX)    Apply topically. Reported on 11/06/2015   NIFEDIPINE (PROCARDIA-XL/ADALAT CC) 60 MG 24 HR TABLET    TAKE ONE TABLET EVERY DAY   NITROFURANTOIN, MACROCRYSTAL-MONOHYDRATE, (MACROBID) 100 MG CAPSULE    Take by mouth.   NON FORMULARY    CPAP (Free Text) - Historical Medication  As directed  Started 22-Sep-1994 Active   OXYBUTYNIN (DITROPAN-XL) 5 MG 24 HR TABLET    Take by mouth.   PRAVASTATIN (PRAVACHOL) 40 MG TABLET    Take by mouth.   TERBINAFINE (LAMISIL) 250 MG TABLET    Take by mouth. Reported on 11/06/2015    Patient Care Team: [name], MD as  PCP - General (Family Medicine) Birder Robson, MD as Referring Physician (Ophthalmology) Murrell Redden, MD (Urology) Yolonda Kida, MD as Consulting Physician (Cardiology)     Objective:   Vitals: BP 130/70 mmHg  Pulse 16  Temp(Src) 98.1 F (36.7 C) (Oral)  Resp 16  Ht 5\' 6"  (1.676 m)  Wt 149 lb (67.586 kg)  BMI 24.06 kg/m2  SpO2 99%  Physical Exam   General Appearance:    Alert, cooperative, no distress  Eyes:    PERRL, conjunctiva/corneas clear, EOM's intact       Lungs:     Clear to auscultation bilaterally, respirations unlabored  Heart:    Regular rate and rhythm  Neurologic:   Awake, alert, oriented x 3. No  apparent focal neurological           defect.        Activities of Daily Living In your present state of health, do you have any difficulty performing the following activities: 11/06/2015  Hearing? N  Vision? N  Difficulty concentrating or making decisions? N  Walking or climbing stairs? N  Dressing or bathing? N  Doing errands, shopping? N    Fall Risk Assessment Fall Risk  11/06/2015  Falls in the past year? No     Depression Screen PHQ 2/9 Scores 11/06/2015  PHQ - 2 Score 0    Cognitive Testing - 6-CIT  Correct? Score   What year is it? yes 0 0 or 4  What month is it? yes 0 0 or 3  Memorize:    Pia Mau,  44,  [place],      What time is it? (within 1 hour) yes 0 0 or 3  Count backwards from 20 yes 0 0, 2, or 4  Name the months of the year yes 0 0, 2, or 4  Repeat name & address above yes 6 0, 2, 4, 6, 8, or 10       TOTAL SCORE  6/28   Interpretation:  Normal  Normal (0-7) Abnormal (8-28)    Audit-C Alcohol Use Screening  Question Answer Points  How often do you have alcoholic drink? 2-4 times monthly 2  On days you do drink alcohol, how many drinks do you typically consume? 1or2 0  How oftey will you drink 6 or more in a total? never 0  Total Score:  2   A score of 3 or more in women, and 4 or more in men indicates  increased risk for alcohol abuse, EXCEPT if all of the points are from question 1.      Assessment & Plan:     Annual Wellness Visit  Reviewed patient's Family Medical History Reviewed and updated list of patient's medical providers Assessment of cognitive impairment was done Assessed patient's functional ability Established a written schedule for health screening [place] Completed and Reviewed  Exercise Activities and Dietary recommendations Goals    None      Immunization History  Administered Date(s) Administered  . Influenza, High Dose Seasonal PF 07/04/2015  . Pneumococcal Conjugate-13 03/10/2014  . Pneumococcal Polysaccharide-23 09/22/1996  . Tdap 09/09/2002, 08/10/2013  . Zoster 07/16/2006    Health Maintenance  Topic Date Due  . INFLUENZA VACCINE  04/22/2016  . TETANUS/TDAP  08/11/2023  . ZOSTAVAX  Completed  . PNA vac Low Risk Adult  Completed      Discussed health benefits of physical activity, and encouraged him to engage in regular exercise appropriate for his age and condition.    -------------------------------------------------------------------------------- 1. Medicare annual wellness visit, subsequent Doing well  2. Acquired hypothyroidism Due for TSH - TSH  3. Essential hypertension Well controlled.  Continue current medications.   - Renal function panel  4. Arteriosclerosis of coronary artery Asymptomatic. Compliant with medication.  Continue aggressive risk factor modification.   - Lipid panel

## 2015-11-07 DIAGNOSIS — E039 Hypothyroidism, unspecified: Secondary | ICD-10-CM | POA: Diagnosis not present

## 2015-11-07 DIAGNOSIS — I10 Essential (primary) hypertension: Secondary | ICD-10-CM | POA: Diagnosis not present

## 2015-11-07 DIAGNOSIS — I2510 Atherosclerotic heart disease of native coronary artery without angina pectoris: Secondary | ICD-10-CM | POA: Diagnosis not present

## 2015-11-08 ENCOUNTER — Telehealth: Payer: Self-pay | Admitting: Family Medicine

## 2015-11-08 LAB — RENAL FUNCTION PANEL
Albumin: 4.1 g/dL (ref 3.5–4.7)
BUN/Creatinine Ratio: 15 (ref 10–22)
BUN: 19 mg/dL (ref 8–27)
CO2: 22 mmol/L (ref 18–29)
Calcium: 9.3 mg/dL (ref 8.6–10.2)
Chloride: 104 mmol/L (ref 96–106)
Creatinine, Ser: 1.28 mg/dL — ABNORMAL HIGH (ref 0.76–1.27)
GFR calc Af Amer: 58 mL/min/{1.73_m2} — ABNORMAL LOW (ref >59)
GFR calc non Af Amer: 50 mL/min/{1.73_m2} — ABNORMAL LOW (ref >59)
Glucose: 99 mg/dL (ref 65–99)
Phosphorus: 4.2 mg/dL (ref 2.5–4.5)
Potassium: 5 mmol/L (ref 3.5–5.2)
Sodium: 141 mmol/L (ref 134–144)

## 2015-11-08 LAB — LIPID PANEL
CHOLESTEROL TOTAL: 130 mg/dL (ref 100–199)
Chol/HDL Ratio: 3.3 ratio units (ref 0.0–5.0)
HDL: 40 mg/dL (ref >39)
LDL Calculated: 65 mg/dL (ref 0–99)
Triglycerides: 125 mg/dL (ref 0–149)
VLDL CHOLESTEROL CAL: 25 mg/dL (ref 5–40)

## 2015-11-08 LAB — TSH: TSH: 0.464 u[IU]/mL (ref 0.450–4.500)

## 2015-12-24 ENCOUNTER — Other Ambulatory Visit: Payer: Self-pay | Admitting: Family Medicine

## 2016-01-01 ENCOUNTER — Encounter: Payer: Self-pay | Admitting: Family Medicine

## 2016-01-03 DIAGNOSIS — Z85828 Personal history of other malignant neoplasm of skin: Secondary | ICD-10-CM | POA: Diagnosis not present

## 2016-01-03 DIAGNOSIS — L821 Other seborrheic keratosis: Secondary | ICD-10-CM | POA: Diagnosis not present

## 2016-01-03 DIAGNOSIS — X32XXXA Exposure to sunlight, initial encounter: Secondary | ICD-10-CM | POA: Diagnosis not present

## 2016-01-03 DIAGNOSIS — L570 Actinic keratosis: Secondary | ICD-10-CM | POA: Diagnosis not present

## 2016-01-03 DIAGNOSIS — Z08 Encounter for follow-up examination after completed treatment for malignant neoplasm: Secondary | ICD-10-CM | POA: Diagnosis not present

## 2016-01-17 ENCOUNTER — Other Ambulatory Visit: Payer: Self-pay | Admitting: Family Medicine

## 2016-02-25 DIAGNOSIS — I209 Angina pectoris, unspecified: Secondary | ICD-10-CM | POA: Diagnosis not present

## 2016-02-25 DIAGNOSIS — R9431 Abnormal electrocardiogram [ECG] [EKG]: Secondary | ICD-10-CM | POA: Diagnosis not present

## 2016-02-25 DIAGNOSIS — E784 Other hyperlipidemia: Secondary | ICD-10-CM | POA: Diagnosis not present

## 2016-02-25 DIAGNOSIS — E079 Disorder of thyroid, unspecified: Secondary | ICD-10-CM | POA: Diagnosis not present

## 2016-02-25 DIAGNOSIS — K219 Gastro-esophageal reflux disease without esophagitis: Secondary | ICD-10-CM | POA: Diagnosis not present

## 2016-02-25 DIAGNOSIS — I10 Essential (primary) hypertension: Secondary | ICD-10-CM | POA: Diagnosis not present

## 2016-02-25 DIAGNOSIS — G4733 Obstructive sleep apnea (adult) (pediatric): Secondary | ICD-10-CM | POA: Diagnosis not present

## 2016-04-24 DIAGNOSIS — H40052 Ocular hypertension, left eye: Secondary | ICD-10-CM | POA: Diagnosis not present

## 2016-06-05 ENCOUNTER — Ambulatory Visit (INDEPENDENT_AMBULATORY_CARE_PROVIDER_SITE_OTHER): Payer: Medicare Other

## 2016-06-05 DIAGNOSIS — Z23 Encounter for immunization: Secondary | ICD-10-CM

## 2016-07-23 ENCOUNTER — Other Ambulatory Visit: Payer: Self-pay | Admitting: Family Medicine

## 2016-08-04 DIAGNOSIS — L720 Epidermal cyst: Secondary | ICD-10-CM | POA: Diagnosis not present

## 2016-08-04 DIAGNOSIS — M79671 Pain in right foot: Secondary | ICD-10-CM | POA: Diagnosis not present

## 2016-08-04 DIAGNOSIS — L851 Acquired keratosis [keratoderma] palmaris et plantaris: Secondary | ICD-10-CM | POA: Diagnosis not present

## 2016-08-10 ENCOUNTER — Emergency Department: Payer: Medicare Other

## 2016-08-10 ENCOUNTER — Emergency Department
Admission: EM | Admit: 2016-08-10 | Discharge: 2016-08-10 | Disposition: A | Discharge status: Home / Self Care | Payer: Medicare Other | Attending: Emergency Medicine | Admitting: Emergency Medicine

## 2016-08-10 ENCOUNTER — Encounter: Payer: Self-pay | Admitting: Emergency Medicine

## 2016-08-10 DIAGNOSIS — E039 Hypothyroidism, unspecified: Secondary | ICD-10-CM | POA: Diagnosis not present

## 2016-08-10 DIAGNOSIS — Z79899 Other long term (current) drug therapy: Secondary | ICD-10-CM | POA: Diagnosis not present

## 2016-08-10 DIAGNOSIS — Z87891 Personal history of nicotine dependence: Secondary | ICD-10-CM | POA: Insufficient documentation

## 2016-08-10 DIAGNOSIS — I2510 Atherosclerotic heart disease of native coronary artery without angina pectoris: Secondary | ICD-10-CM | POA: Insufficient documentation

## 2016-08-10 DIAGNOSIS — Z8546 Personal history of malignant neoplasm of prostate: Secondary | ICD-10-CM | POA: Insufficient documentation

## 2016-08-10 DIAGNOSIS — Z7982 Long term (current) use of aspirin: Secondary | ICD-10-CM | POA: Diagnosis not present

## 2016-08-10 DIAGNOSIS — K5900 Constipation, unspecified: Secondary | ICD-10-CM | POA: Diagnosis not present

## 2016-08-10 DIAGNOSIS — I10 Essential (primary) hypertension: Secondary | ICD-10-CM | POA: Diagnosis not present

## 2016-08-10 MED ORDER — MAGNESIUM CITRATE PO SOLN
1.0000 | Freq: Once | ORAL | Status: AC
  Administered 2016-08-10: 1 via ORAL
  Filled 2016-08-10: qty 296

## 2016-08-10 MED ORDER — MILK AND MOLASSES ENEMA
1.0000 | Freq: Once | RECTAL | Status: AC
  Administered 2016-08-10: 250 mL via RECTAL

## 2016-08-10 NOTE — ED Notes (Signed)
Pt verbalized DC instructions with teach back. Also verbalized understanding of use of magnesium citrate solution with read back. Pt had no questions at discharge.

## 2016-08-10 NOTE — ED Notes (Signed)
Administered enema to patient. Patient tolerated procedure well. Patient up at commode at this time with call bell in reach. Will continue to monitor.

## 2016-08-10 NOTE — ED Notes (Signed)
ED Provider at bedside. 

## 2016-08-10 NOTE — ED Notes (Signed)
Pt reports pain rectum but denies pain abd. No other pain or NV reported. Normal PO intake and fluid intake reported.

## 2016-08-10 NOTE — ED Notes (Signed)
Pt reports feeling as though he has finished having BM after enema. Pt had large BM and reports feeling as though the hard mass of stool he felt was stuck is no longer stuck. Pt reports he still feels as though there is "more stool left though."

## 2016-08-10 NOTE — ED Triage Notes (Signed)
Pt states unsure exactly how long he has been constipated but approx one week. Has tried laxatives and metamucil without success

## 2016-08-10 NOTE — Discharge Instructions (Signed)
Try a half a bottle of magnesium citrate today when he get home. If that doesn't give you a bowel movement he can try the other half in the morning. Remember what I said about 25 her continuing her exercise and drinking a lot more fluids. Please return for any further problems or follow-up with your doctor.

## 2016-08-10 NOTE — ED Provider Notes (Signed)
[facility] Emergency Department Provider Note   ____________________________________________   First MD Initiated Contact with Patient 08/10/16 1152     (approximate)  I have reviewed the triage vital signs and the nursing notes.   HISTORY  Chief Complaint Constipation    HPI Roger Cook is a 82 y.o. male who reports he had constipation for a week. He says he can reach around and put his finger in his rectum and feels stool but he cannot make a stool come out. He walks 2 miles a day. He eats plenty of fiber but does not drink much. His been trying to drink a lot more. He says he's been having some fluid come out around the stool but really is not able to push the stool out. He has not had any vomiting or nausea.   Past Medical History:  Diagnosis Date  . Anemia   . Bell palsy   . Bladder tumor   . CAD (coronary artery disease)   . Hyperlipidemia   . Hypertension   . Lipoma   . MRSA bacteremia 02/2011  . Sleep apnea   . Thyroid disease    hypothyroidism    Patient Active Problem List   Diagnosis Date Noted  . Bell's palsy 02/26/2015  . Neoplasm of bladder 02/26/2015  . Folliculitis [id number]  . Personal history of methicillin resistant Staphylococcus aureus 02/26/2015  . Fatty tumor 02/26/2015  . Syncope and collapse 02/26/2015  . Fungal infection of nail 02/26/2015  . H/O malignant neoplasm of prostate 02/26/2015  . Altered blood in stool 10/18/2012  . Arteriosclerosis of coronary artery 01/05/2009  . Apnea, sleep 01/05/2009  . Acquired hypothyroidism 01/05/2009  . BP (high blood pressure) 09/22/1998    Past Surgical History:  Procedure Laterality Date  . APPENDECTOMY  2005  . EYE SURGERY  2005   cataract surgery, also had a macular hole in 2005  . EYE SURGERY  2008   catarac surgery  . HAND SURGERY Left 06/26/2011   Malignancy removed from left hand  . NASAL SEPTUM SURGERY  1986  . NASAL SINUS SURGERY  1990  . penile  inplant  1984  . polyp of rectum  2003  . PROSTATE SURGERY  1975   Abdominal, had to have radiation treatment with the procedure  . TONSILLECTOMY      Prior to Admission medications   Medication Sig Start Date End Date Taking? Authorizing Provider  ARTIFICIAL TEAR SOLUTION OP Apply to eye.    Historical Provider, MD  aspirin 81 MG tablet Take by mouth. 01/05/09   Historical Provider, MD  atenolol (TENORMIN) 25 MG tablet Take 1 tablet (25 mg total) by mouth daily. 12/24/15   [name], MD  brimonidine-timolol (COMBIGAN) 0.2-0.5 % ophthalmic solution Place 1 drop into both eyes every 12 (twelve) hours.    Historical Provider, MD  fluticasone (FLONASE) 50 MCG/ACT nasal spray 1 OR 2 SPRAYS IN EACH NOSTRIL EVERY DAY AS NEEDED 07/23/16   [name], MD  levothyroxine (SYNTHROID, LEVOTHROID) 100 MCG tablet TAKE ONE TABLET EVERY DAY 08/27/15   [name], MD  MULTIPLE VITAMIN PO Take by mouth.    Historical Provider, MD  NIFEdipine (PROCARDIA-XL/ADALAT CC) 60 MG 24 hr tablet TAKE ONE TABLET BY MOUTH EVERY DAY 01/17/16   [name], MD  nitrofurantoin, macrocrystal-monohydrate, (MACROBID) 100 MG capsule Take by mouth.    Historical Provider, MD  NON FORMULARY CPAP (Free Text) - Historical Medication  As  directed  Started 22-Sep-1994 Active 09/22/1994   Historical Provider, MD  oxybutynin (DITROPAN-XL) 5 MG 24 hr tablet Take by mouth. 08/29/14   Historical Provider, MD  pravastatin (PRAVACHOL) 40 MG tablet TAKE ONE TABLET EVERY DAY 11/06/15   [name], MD  terbinafine (LAMISIL) 250 MG tablet Take by mouth. Reported on 11/06/2015 08/11/14   Historical Provider, MD    Allergies Levofloxacin; Povidone iodine; and Benadryl [diphenhydramine]  Family History  Problem Relation Age of Onset  . Lung cancer Mother   . Heart disease Father     Social History Social History  Substance Use Topics  . Smoking status: Former Research scientist (life sciences)  . Smokeless tobacco: Never Used  . Alcohol use 0.0  oz/week    Review of Systems Constitutional: No fever/chills Eyes: No visual changes. ENT: No sore throat. Cardiovascular: Denies chest pain. Respiratory: Denies shortness of breath. Gastrointestinal: No abdominal pain.  No nausea, no vomiting.  No diarrhea.   Genitourinary: Negative for dysuria. Musculoskeletal: Negative for back pain. Skin: Negative for rash. Neurological: Negative for headaches, focal weakness or numbness.  10-point ROS otherwise negative.  ____________________________________________   PHYSICAL EXAM:  VITAL SIGNS: ED Triage Vitals  Enc Vitals Group     BP 08/10/16 1144 (!) 166/82     Pulse Rate 08/10/16 1144 (!) 55     Resp 08/10/16 1144 16     Temp 08/10/16 1144 97.7 F (36.5 C)     Temp src --      SpO2 08/10/16 1144 99 %     Weight 08/10/16 1145 140 lb (63.5 kg)     Height 08/10/16 1145 5\' 7"  (1.702 m)     Head Circumference --      Peak Flow --      Pain Score --      Pain Loc --      Pain Edu? --      Excl. in [place]? --     Constitutional: Alert and oriented. Well appearing and in no acute distress. Eyes: Conjunctivae are normal. PERRL. EOMI. Head: Atraumatic. Nose: No congestion/rhinnorhea. Mouth/Throat: Mucous membranes are moist.  Oropharynx non-erythematous. Neck: No stridor.  Cardiovascular: Normal rate, regular rhythm. Grossly normal heart sounds.  Good peripheral circulation. Respiratory: Normal respiratory effort.  No retractions. Lungs CTAB. Gastrointestinal: Soft and nontender. No distention. No abdominal bruits. No CVA tenderness. Rectal: Patient is alert amount of firm stool in the rectum. I broke this up with my fingertip. We will try an enema to see if that will help him evacuated. }Musculoskeletal: No lower extremity tenderness nor edema.  No joint effusions. Neurologic:  Normal speech and language. No gross focal neurologic deficits are appreciated. No gait instability. Skin:  Skin is warm, dry and intact. No rash  noted. Psychiatric: Mood and affect are normal. Speech and behavior are normal.  ____________________________________________   LABS (all labs ordered are listed, but only abnormal results are displayed)  Labs Reviewed - No data to display ____________________________________________  EKG   ____________________________________________  RADIOLOGY  A lot of stool on KUB ____________________________________________   PROCEDURES  Procedure(s) performed:   Procedures  Critical Care performed:  ____________________________________________   INITIAL IMPRESSION / ASSESSMENT AND PLAN / ED COURSE  Pertinent labs & imaging results that were available during my care of the patient were reviewed by me and considered in my medical decision making (see chart for details).    Clinical Course    Patient had an enema had good results. He still has a  lot of stool but feels better. He will try some Axid trait  ____________________________________________   FINAL CLINICAL IMPRESSION(S) / ED DIAGNOSES  Final diagnoses:  Constipation, unspecified constipation type      NEW MEDICATIONS STARTED DURING THIS VISIT:  New Prescriptions   No medications on file     Note:  This document was prepared using Dragon voice recognition software and may include unintentional dictation errors.    Nena Polio, MD 08/10/16 [phone]

## 2016-08-20 ENCOUNTER — Encounter: Payer: Self-pay | Admitting: Family Medicine

## 2016-08-20 ENCOUNTER — Ambulatory Visit (INDEPENDENT_AMBULATORY_CARE_PROVIDER_SITE_OTHER): Payer: Medicare Other | Admitting: Family Medicine

## 2016-08-20 VITALS — BP 118/54 | HR 63 | Temp 99.3°F | Resp 18 | Wt 147.0 lb

## 2016-08-20 DIAGNOSIS — J069 Acute upper respiratory infection, unspecified: Secondary | ICD-10-CM | POA: Diagnosis not present

## 2016-08-20 DIAGNOSIS — I2510 Atherosclerotic heart disease of native coronary artery without angina pectoris: Secondary | ICD-10-CM

## 2016-08-20 MED ORDER — AZITHROMYCIN 250 MG PO TABS: ORAL_TABLET | ORAL | 0 refills | Status: AC

## 2016-08-20 NOTE — Patient Instructions (Signed)
Fill antibiotic prescription if you develop fever over 101, shortness of breath, pain in your sinuses or if your symptoms are not improving 5-6 days.    Upper Respiratory Infection, Adult Most upper respiratory infections (URIs) are a viral infection of the air passages leading to the lungs. A URI affects the nose, throat, and upper air passages. The most common type of URI is nasopharyngitis and is typically referred to as "the common cold." URIs run their course and usually go away on their own. Most of the time, a URI does not require medical attention, but sometimes a bacterial infection in the upper airways can follow a viral infection. This is called a secondary infection. Sinus and middle ear infections are common types of secondary upper respiratory infections. Bacterial pneumonia can also complicate a URI. A URI can worsen asthma and chronic obstructive pulmonary disease (COPD). Sometimes, these complications can require emergency medical care and may be life threatening. What are the causes? Almost all URIs are caused by viruses. A virus is a type of germ and can spread from one person to another. What increases the risk? You may be at risk for a URI if:  You smoke.  You have chronic heart or lung disease.  You have a weakened defense (immune) system.  You are very young or very old.  You have nasal allergies or asthma.  You work in crowded or poorly ventilated areas.  You work in health care facilities or schools. What are the signs or symptoms? Symptoms typically develop 2-3 days after you come in contact with a cold virus. Most viral URIs last 7-10 days. However, viral URIs from the influenza virus (flu virus) can last 14-18 days and are typically more severe. Symptoms may include:  Runny or stuffy (congested) nose.  Sneezing.  Cough.  Sore throat.  Headache.  Fatigue.  Fever.  Loss of appetite.  Pain in your forehead, behind your eyes, and over your  cheekbones (sinus pain).  Muscle aches. How is this diagnosed? Your health care provider may diagnose a URI by:  Physical exam.  Tests to check that your symptoms are not due to another condition such as:  Strep throat.  Sinusitis.  Pneumonia.  Asthma. How is this treated? A URI goes away on its own with time. It cannot be cured with medicines, but medicines may be prescribed or recommended to relieve symptoms. Medicines may help:  Reduce your fever.  Reduce your cough.  Relieve nasal congestion. Follow these instructions at home:  Take medicines only as directed by your health care provider.  Gargle warm saltwater or take cough drops to comfort your throat as directed by your health care provider.  Use a warm mist humidifier or inhale steam from a shower to increase air moisture. This may make it easier to breathe.  Drink enough fluid to keep your urine clear or pale yellow.  Eat soups and other clear broths and maintain good nutrition.  Rest as needed.  Return to work when your temperature has returned to normal or as your health care provider advises. You may need to stay home longer to avoid infecting others. You can also use a face mask and careful hand washing to prevent spread of the virus.  Increase the usage of your inhaler if you have asthma.  Do not use any tobacco products, including cigarettes, chewing tobacco, or electronic cigarettes. If you need help quitting, ask your health care provider. How is this prevented? The best way to protect  yourself from getting a cold is to practice good hygiene.  Avoid oral or hand contact with people with cold symptoms.  Wash your hands often if contact occurs. There is no clear evidence that vitamin C, vitamin E, echinacea, or exercise reduces the chance of developing a cold. However, it is always recommended to get plenty of rest, exercise, and practice good nutrition. Contact a health care provider if:  You are  getting worse rather than better.  Your symptoms are not controlled by medicine.  You have chills.  You have worsening shortness of breath.  You have brown or red mucus.  You have yellow or brown nasal discharge.  You have pain in your face, especially when you bend forward.  You have a fever.  You have swollen neck glands.  You have pain while swallowing.  You have white areas in the back of your throat. Get help right away if:  You have severe or persistent:  Headache.  Ear pain.  Sinus pain.  Chest pain.  You have chronic lung disease and any of the following:  Wheezing.  Prolonged cough.  Coughing up blood.  A change in your usual mucus.  You have a stiff neck.  You have changes in your:  Vision.  Hearing.  Thinking.  Mood. This information is not intended to replace advice given to you by your health care provider. Make sure you discuss any questions you have with your health care provider. Document Released: 03/04/2001 Document Revised: 05/11/2016 Document Reviewed: 12/14/2013 Elsevier Interactive Patient Education  2017 [employer].

## 2016-08-20 NOTE — Progress Notes (Signed)
Patient: Roger Cook Male    DOB: 1926-10-14   82 y.o.   MRN: [id number] Visit Date: 08/20/2016  Today's Provider: Lelon Huh, MD   Chief Complaint  Patient presents with  . Cough    x 1 day   Subjective:    Cough  This is a new problem. The current episode started yesterday. The problem has been gradually worsening. Associated symptoms include a fever (low grade 99), rhinorrhea and a sore throat. Pertinent negatives include no chest pain, chills, ear congestion, ear pain, eye redness, headaches, hemoptysis, myalgias, nasal congestion, postnasal drip, shortness of breath, sweats or wheezing. Treatments tried: Alka Seltzer plus Cold and Tylenol.       Allergies  Allergen Reactions  . Levofloxacin     lips swelling  . Povidone Iodine   . Benadryl [Diphenhydramine] Rash     Current Outpatient Prescriptions:  .  ARTIFICIAL TEAR SOLUTION OP, Apply to eye., Disp: , Rfl:  .  aspirin 81 MG tablet, Take by mouth., Disp: , Rfl:  .  atenolol (TENORMIN) 25 MG tablet, Take 1 tablet (25 mg total) by mouth daily., Disp: 90 tablet, Rfl: 4 .  brimonidine-timolol (COMBIGAN) 0.2-0.5 % ophthalmic solution, Place 1 drop into both eyes every 12 (twelve) hours., Disp: , Rfl:  .  fluticasone (FLONASE) 50 MCG/ACT nasal spray, 1 OR 2 SPRAYS IN EACH NOSTRIL EVERY DAY AS NEEDED, Disp: 48 g, Rfl: 5 .  levothyroxine (SYNTHROID, LEVOTHROID) 100 MCG tablet, TAKE ONE TABLET EVERY DAY, Disp: 90 tablet, Rfl: 4 .  MULTIPLE VITAMIN PO, Take by mouth., Disp: , Rfl:  .  NIFEdipine (PROCARDIA-XL/ADALAT CC) 60 MG 24 hr tablet, TAKE ONE TABLET BY MOUTH EVERY DAY, Disp: 90 tablet, Rfl: 4 .  nitrofurantoin, macrocrystal-monohydrate, (MACROBID) 100 MG capsule, Take by mouth., Disp: , Rfl:  .  NON FORMULARY, CPAP (Free Text) - Historical Medication  As directed  Started 22-Sep-1994 Active, Disp: , Rfl:  .  oxybutynin (DITROPAN-XL) 5 MG 24 hr tablet, Take by mouth., Disp: , Rfl:  .  pravastatin (PRAVACHOL) 40  MG tablet, TAKE ONE TABLET EVERY DAY, Disp: 90 tablet, Rfl: 4 .  terbinafine (LAMISIL) 250 MG tablet, Take by mouth. Reported on 11/06/2015, Disp: , Rfl:   Review of Systems  Constitutional: Positive for fatigue and fever (low grade 99). Negative for appetite change and chills.  HENT: Positive for rhinorrhea, sore throat and voice change (hoarsness). Negative for congestion, ear discharge, ear pain, facial swelling, hearing loss, mouth sores, nosebleeds, postnasal drip, sinus pain, sinus pressure, sneezing and tinnitus.   Eyes: Negative for pain, discharge, redness and itching.  Respiratory: Positive for cough (productive with yellow phlegm). Negative for hemoptysis, chest tightness, shortness of breath and wheezing.   Cardiovascular: Negative for chest pain and palpitations.  Gastrointestinal: Negative for abdominal pain, nausea and vomiting.  Musculoskeletal: Negative for myalgias.  Neurological: Negative for headaches.    Social History  Substance Use Topics  . Smoking status: Former Research scientist (life sciences)  . Smokeless tobacco: Never Used  . Alcohol use 0.0 oz/week     Comment: occasional    Objective:   BP (!) 118/54 (BP Location: Left Arm, Patient Position: Sitting, Cuff Size: Normal)   Pulse 63   Temp 99.3 F (37.4 C) (Oral)   Resp 18   Wt 147 lb (66.7 kg)   SpO2 97% Comment: room air  BMI 23.02 kg/m   Physical Exam  General Appearance:    Alert, cooperative, no distress  HENT:   bilateral TM normal without fluid or infection, bilateral  neck without nodes, post nasal drip noted, nasal mucosa congested and nasal mucosa pale and congested  Eyes:    PERRL, conjunctiva/corneas clear, EOM's intact       Lungs:     Clear to auscultation bilaterally, respirations unlabored  Heart:    Regular rate and rhythm  Neurologic:   Awake, alert, oriented x 3. No apparent focal neurological           defect.           Assessment & Plan:     1. Upper respiratory tract infection, unspecified  type Counseled regarding signs and symptoms of viral and bacterial respiratory infections. Advised him that he can start prescription for antibiotic if he develops any sign of bacterial infection, or if current symptoms last longer than 10 days.         The entirety of the information documented in the History of Present Illness, Review of Systems and Physical Exam were personally obtained by me. Portions of this information were initially documented by Meyer Cory, CMA and reviewed by me for thoroughness and accuracy.   Lelon Huh, MD  [facility]

## 2016-08-25 ENCOUNTER — Other Ambulatory Visit: Payer: Self-pay | Admitting: Family Medicine

## 2016-08-25 DIAGNOSIS — M79671 Pain in right foot: Secondary | ICD-10-CM | POA: Diagnosis not present

## 2016-08-25 DIAGNOSIS — D2371 Other benign neoplasm of skin of right lower limb, including hip: Secondary | ICD-10-CM | POA: Diagnosis not present

## 2016-09-28 ENCOUNTER — Emergency Department
Admission: EM | Admit: 2016-09-28 | Discharge: 2016-09-28 | Disposition: A | Discharge status: Home / Self Care | Payer: Medicare Other | Attending: Emergency Medicine | Admitting: Emergency Medicine

## 2016-09-28 DIAGNOSIS — E039 Hypothyroidism, unspecified: Secondary | ICD-10-CM | POA: Diagnosis not present

## 2016-09-28 DIAGNOSIS — K5641 Fecal impaction: Secondary | ICD-10-CM

## 2016-09-28 DIAGNOSIS — Z7982 Long term (current) use of aspirin: Secondary | ICD-10-CM | POA: Insufficient documentation

## 2016-09-28 DIAGNOSIS — I2510 Atherosclerotic heart disease of native coronary artery without angina pectoris: Secondary | ICD-10-CM | POA: Insufficient documentation

## 2016-09-28 DIAGNOSIS — I10 Essential (primary) hypertension: Secondary | ICD-10-CM | POA: Insufficient documentation

## 2016-09-28 DIAGNOSIS — Z87891 Personal history of nicotine dependence: Secondary | ICD-10-CM | POA: Insufficient documentation

## 2016-09-28 DIAGNOSIS — K5900 Constipation, unspecified: Secondary | ICD-10-CM | POA: Diagnosis not present

## 2016-09-28 DIAGNOSIS — Z79899 Other long term (current) drug therapy: Secondary | ICD-10-CM | POA: Insufficient documentation

## 2016-09-28 MED ORDER — MINERAL OIL RE ENEM
1.0000 | ENEMA | Freq: Once | RECTAL | Status: AC
  Administered 2016-09-28: 1 via RECTAL

## 2016-09-28 NOTE — ED Notes (Signed)
Dr. Kinner in room to assess patient.  Will continue to monitor.   

## 2016-09-28 NOTE — ED Notes (Signed)
Dr Kinner at bedside. 

## 2016-09-28 NOTE — ED Notes (Signed)
Pt verbalizes understanding of discharge instructions.

## 2016-09-28 NOTE — ED Notes (Signed)
Pt at bedside commode reports he had a small BM, straining. Will continue to try to have a BM for few minutes

## 2016-09-28 NOTE — ED Notes (Signed)
Patient sitting on bedside commode at this time.  Producing some results.

## 2016-09-28 NOTE — ED Triage Notes (Signed)
Pt reports that he has not had a BM in the last 4-5 days - he states he feels like he blockage (history of impaction) - he reports that he needs an enema

## 2016-09-28 NOTE — ED Notes (Signed)
Patient presents to the ED for constipation/impaction.  Patient ambulatory to assessment room with no obvious distress.  Patient reports history of the same problem previously and states, "I just need an enema."  Patient reports stool is at anus.

## 2016-09-28 NOTE — ED Provider Notes (Signed)
[facility] Emergency Department Provider Note   ____________________________________________    I have reviewed the triage vital signs and the nursing notes.   HISTORY  Chief Complaint Constipation   HPI Roger Cook is a 83 y.o. male who presents with complaints of constipation. Patient reports he has not a bowel movement in 5 days. He does report a history of impaction in the past and this feels similar. He attempted to remove stool himself but was unable to do so. He denies abdominal pain no nausea or vomiting   Past Medical History:  Diagnosis Date  . Anemia   . Bell palsy   . Bladder tumor   . CAD (coronary artery disease)   . Hyperlipidemia   . Hypertension   . Lipoma   . MRSA bacteremia 02/2011  . Sleep apnea   . Thyroid disease    hypothyroidism    Patient Active Problem List   Diagnosis Date Noted  . Bell's palsy 02/26/2015  . Neoplasm of bladder 02/26/2015  . Folliculitis [id number]  . Personal history of methicillin resistant Staphylococcus aureus 02/26/2015  . Fatty tumor 02/26/2015  . Syncope and collapse 02/26/2015  . Fungal infection of nail 02/26/2015  . H/O malignant neoplasm of prostate 02/26/2015  . Altered blood in stool 10/18/2012  . Arteriosclerosis of coronary artery 01/05/2009  . Apnea, sleep 01/05/2009  . Acquired hypothyroidism 01/05/2009  . BP (high blood pressure) 09/22/1998    Past Surgical History:  Procedure Laterality Date  . APPENDECTOMY  2005  . EYE SURGERY  2005   cataract surgery, also had a macular hole in 2005  . EYE SURGERY  2008   catarac surgery  . HAND SURGERY Left 06/26/2011   Malignancy removed from left hand  . NASAL SEPTUM SURGERY  1986  . NASAL SINUS SURGERY  1990  . penile inplant  1984  . polyp of rectum  2003  . PROSTATE SURGERY  1975   Abdominal, had to have radiation treatment with the procedure  . TONSILLECTOMY      Prior to Admission medications   Medication Sig  Start Date End Date Taking? Authorizing Provider  ARTIFICIAL TEAR SOLUTION OP Apply to eye.    Historical Provider, MD  aspirin 81 MG tablet Take by mouth. 01/05/09   Historical Provider, MD  atenolol (TENORMIN) 25 MG tablet Take 1 tablet (25 mg total) by mouth daily. 12/24/15   [name], MD  brimonidine-timolol (COMBIGAN) 0.2-0.5 % ophthalmic solution Place 1 drop into both eyes every 12 (twelve) hours.    Historical Provider, MD  fluticasone (FLONASE) 50 MCG/ACT nasal spray 1 OR 2 SPRAYS IN EACH NOSTRIL EVERY DAY AS NEEDED 07/23/16   [name], MD  levothyroxine (SYNTHROID, LEVOTHROID) 100 MCG tablet TAKE ONE TABLET BY MOUTH EVERY DAY 08/25/16   [name], MD  MULTIPLE VITAMIN PO Take by mouth.    Historical Provider, MD  NIFEdipine (PROCARDIA-XL/ADALAT CC) 60 MG 24 hr tablet TAKE ONE TABLET BY MOUTH EVERY DAY 01/17/16   [name], MD  nitrofurantoin, macrocrystal-monohydrate, (MACROBID) 100 MG capsule Take by mouth.    Historical Provider, MD  NON FORMULARY CPAP (Free Text) - Historical Medication  As directed  Started 22-Sep-1994 Active 09/22/1994   Historical Provider, MD  oxybutynin (DITROPAN-XL) 5 MG 24 hr tablet Take by mouth. 08/29/14   Historical Provider, MD  pravastatin (PRAVACHOL) 40 MG tablet TAKE ONE TABLET EVERY DAY 11/06/15   [name], MD  terbinafine (LAMISIL) 250 MG tablet Take by mouth. Reported on 11/06/2015 08/11/14   Historical Provider, MD     Allergies Levofloxacin; Povidone iodine; and Benadryl [diphenhydramine]  Family History  Problem Relation Age of Onset  . Lung cancer Mother   . Heart disease Father     Social History Social History  Substance Use Topics  . Smoking status: Former Research scientist (life sciences)  . Smokeless tobacco: Never Used  . Alcohol use 0.0 oz/week     Comment: occasional     Review of Systems  Constitutional: No fever/chills  Gastrointestinal: Constipation as above  Musculoskeletal: Negative for back pain. Skin: Negative for  rash. Neurological: Negative for headaches   10-point ROS otherwise negative.  ____________________________________________   PHYSICAL EXAM:  VITAL SIGNS: ED Triage Vitals  Enc Vitals Group     BP 09/28/16 1755 (!) 168/46     Pulse Rate 09/28/16 1755 72     Resp 09/28/16 1755 18     Temp 09/28/16 1755 98 F (36.7 C)     Temp Source 09/28/16 1755 Oral     SpO2 09/28/16 1755 98 %     Weight 09/28/16 1755 140 lb (63.5 kg)     Height 09/28/16 1755 5\' 7"  (1.702 m)     Head Circumference --      Peak Flow --      Pain Score 09/28/16 1803 7     Pain Loc --      Pain Edu? --      Excl. in [place]? --     Constitutional: Alert and oriented. No acute distress. Pleasant and interactive Eyes: Conjunctivae are normal.   Cardiovascular: Normal rate, regular rhythm.  Good peripheral circulation. Respiratory: Normal respiratory effort.  . Lungs CTAB. Gastrointestinal: Soft and nontender. No distention.  No CVA tenderness. Genitourinary: deferred Musculoskeletal: Warm and well perfused Neurologic:  Normal speech and language. No gross focal neurologic deficits are appreciated.  Skin:  Skin is warm, dry and intact. No rash noted. Psychiatric: Mood and affect are normal. Speech and behavior are normal.  ____________________________________________   LABS (all labs ordered are listed, but only abnormal results are displayed)  Labs Reviewed - No data to display ____________________________________________  EKG  None ____________________________________________  RADIOLOGY  None ____________________________________________   PROCEDURES  Procedure(s) performed: yes  ------------------------------------------------------------------------------------------------------------------- Fecal Disimpaction Procedure Note:  Performed by me:  Patient placed in the lateral recumbent position with knees drawn towards chest. Nurse present for patient support. Large amount of hard brown  stool removed. No complications during procedure.   ------------------------------------------------------------------------------------------------------------------      Critical Care performed:No ____________________________________________   INITIAL IMPRESSION / ASSESSMENT AND PLAN / ED COURSE  Pertinent labs & imaging results that were available during my care of the patient were reviewed by me and considered in my medical decision making (see chart for details).  Patient presents with constipation with fecal impaction. Disimpaction performed by me, followed by enema.  Clinical Course   ----------------------------------------- 8:16 PM on 09/28/2016 -----------------------------------------  Patient had successful bowel movement, he feels well. Outpatient follow-up as needed ____________________________________________   FINAL CLINICAL IMPRESSION(S) / ED DIAGNOSES  Final diagnoses:  Fecal impaction in rectum (HCC)  Constipation, unspecified constipation type      NEW MEDICATIONS STARTED DURING THIS VISIT:  New Prescriptions   No medications on file     Note:  This document was prepared using Dragon voice recognition software and may include unintentional dictation errors.    Lavonia Drafts, MD 09/28/16 2016

## 2016-10-23 DIAGNOSIS — H40052 Ocular hypertension, left eye: Secondary | ICD-10-CM | POA: Diagnosis not present

## 2016-11-06 ENCOUNTER — Ambulatory Visit (INDEPENDENT_AMBULATORY_CARE_PROVIDER_SITE_OTHER): Payer: Medicare Other

## 2016-11-06 ENCOUNTER — Other Ambulatory Visit: Payer: Self-pay | Admitting: Family Medicine

## 2016-11-06 ENCOUNTER — Ambulatory Visit (INDEPENDENT_AMBULATORY_CARE_PROVIDER_SITE_OTHER): Payer: Medicare Other | Admitting: Family Medicine

## 2016-11-06 VITALS — BP 142/56 | HR 60 | Temp 97.6°F | Ht 67.0 in | Wt 147.2 lb

## 2016-11-06 DIAGNOSIS — Z0000 Encounter for general adult medical examination without abnormal findings: Secondary | ICD-10-CM | POA: Diagnosis not present

## 2016-11-06 DIAGNOSIS — Z8546 Personal history of malignant neoplasm of prostate: Secondary | ICD-10-CM | POA: Diagnosis not present

## 2016-11-06 DIAGNOSIS — E039 Hypothyroidism, unspecified: Secondary | ICD-10-CM | POA: Diagnosis not present

## 2016-11-06 DIAGNOSIS — K5900 Constipation, unspecified: Secondary | ICD-10-CM | POA: Diagnosis not present

## 2016-11-06 DIAGNOSIS — I2510 Atherosclerotic heart disease of native coronary artery without angina pectoris: Secondary | ICD-10-CM

## 2016-11-06 DIAGNOSIS — L723 Sebaceous cyst: Secondary | ICD-10-CM

## 2016-11-06 DIAGNOSIS — I10 Essential (primary) hypertension: Secondary | ICD-10-CM

## 2016-11-06 DIAGNOSIS — R001 Bradycardia, unspecified: Secondary | ICD-10-CM

## 2016-11-06 MED ORDER — METOPROLOL SUCCINATE ER 25 MG PO TB24: 12.5000 mg | ORAL_TABLET | Freq: Every day | ORAL | 1 refills | Status: DC

## 2016-11-06 NOTE — Patient Instructions (Signed)

## 2016-11-06 NOTE — Patient Instructions (Signed)
   Start taking OTC metamucil and 100mg  Colace every evening   Save the Miralax for times when you don't have BM for 3 or more days

## 2016-11-06 NOTE — Progress Notes (Signed)
Subjective:   Roger Cook is a 83 y.o. male who presents for Medicare Annual/Subsequent preventive examination.  Review of Systems:  N/A  Cardiac Risk Factors include: advanced age (>36men, >49 women);hypertension;male gender     Objective:    Vitals: BP (!) 142/56 (BP Location: Right Arm)   Pulse 60   Temp 97.6 F (36.4 C) (Oral)   Ht 5\' 7"  (1.702 m)   Wt 147 lb 3.2 oz (66.8 kg)   BMI 23.05 kg/m   Body mass index is 23.05 kg/m.  Tobacco History  Smoking Status  . Former Smoker  . Types: Cigarettes  Smokeless Tobacco  . Never Used    Comment: quit in 1963     Counseling given: Not Answered   Past Medical History:  Diagnosis Date  . Anemia   . Bell palsy   . Bladder tumor   . CAD (coronary artery disease)   . Hyperlipidemia   . Hypertension   . Lipoma   . MRSA bacteremia 02/2011  . Sleep apnea   . Thyroid disease    hypothyroidism   Past Surgical History:  Procedure Laterality Date  . APPENDECTOMY  2005  . EYE SURGERY  2005   cataract surgery, also had a macular hole in 2005  . EYE SURGERY  2008   catarac surgery  . HAND SURGERY Left 06/26/2011   Malignancy removed from left hand  . NASAL SEPTUM SURGERY  1986  . NASAL SINUS SURGERY  1990  . penile inplant  1984  . polyp of rectum  2003  . PROSTATE SURGERY  1975   Abdominal, had to have radiation treatment with the procedure  . TONSILLECTOMY     Family History  Problem Relation Age of Onset  . Lung cancer Mother   . Heart disease Father    History  Sexual Activity  . Sexual activity: Not on file    Outpatient Encounter Prescriptions as of 11/06/2016  Medication Sig  . ARTIFICIAL TEAR SOLUTION OP Apply to eye.  Marland Kitchen aspirin 81 MG tablet Take by mouth.  Marland Kitchen atenolol (TENORMIN) 25 MG tablet Take 1 tablet (25 mg total) by mouth daily.  . brimonidine-timolol (COMBIGAN) 0.2-0.5 % ophthalmic solution Place 1 drop into both eyes every 12 (twelve) hours.  . docusate sodium (COLACE) 100 MG capsule  Take 100 mg by mouth daily as needed for mild constipation.  . fluticasone (FLONASE) 50 MCG/ACT nasal spray 1 OR 2 SPRAYS IN EACH NOSTRIL EVERY DAY AS NEEDED  . levothyroxine (SYNTHROID, LEVOTHROID) 100 MCG tablet TAKE ONE TABLET BY MOUTH EVERY DAY  . magnesium hydroxide (MILK OF MAGNESIA) 400 MG/5ML suspension Take by mouth daily as needed for mild constipation.  . MULTIPLE VITAMIN PO Take by mouth.  Marland Kitchen NIFEdipine (PROCARDIA-XL/ADALAT CC) 60 MG 24 hr tablet TAKE ONE TABLET BY MOUTH EVERY DAY  . nitrofurantoin, macrocrystal-monohydrate, (MACROBID) 100 MG capsule Take by mouth.  . NON FORMULARY CPAP (Free Text) - Historical Medication  As directed  Started 22-Sep-1994 Active  . oxybutynin (DITROPAN-XL) 5 MG 24 hr tablet Take by mouth.  . polyethylene glycol (MIRALAX / GLYCOLAX) packet Take 17 g by mouth daily as needed.  . pravastatin (PRAVACHOL) 40 MG tablet TAKE ONE TABLET EVERY DAY  . terbinafine (LAMISIL) 250 MG tablet Take by mouth. Reported on 11/06/2015   No facility-administered encounter medications on file as of 11/06/2016.     Activities of Daily Living In your present state of health, do you have any difficulty performing  the following activities: 11/06/2016  Hearing? N  Vision? N  Difficulty concentrating or making decisions? Y  Walking or climbing stairs? N  Dressing or bathing? N  Doing errands, shopping? N  Preparing Food and eating ? N  Using the Toilet? N  In the past six months, have you accidently leaked urine? Y  Do you have problems with loss of bowel control? N  Managing your Medications? N  Managing your Finances? N  Housekeeping or managing your Housekeeping? N  Some recent data might be hidden    Patient Care Team: [name], MD as PCP - General (Family Medicine) Birder Robson, MD as Referring Physician (Ophthalmology) Murrell Redden, MD (Urology) Yolonda Kida, MD as Consulting Physician (Cardiology)   Assessment:     Exercise Activities and  Dietary recommendations Current Exercise Habits: Home exercise routine, Type of exercise: walking, Time (Minutes): 60 (walks 2 miles a day), Frequency (Times/Week): 7, Weekly Exercise (Minutes/Week): 420, Intensity: Mild, Exercise limited by: None identified  Goals    . Increase water intake          Starting 11/06/16, I will increase my water intake to 4-6 glasses a day.      Fall Risk Fall Risk  11/06/2016 11/06/2015  Falls in the past year? No No   Depression Screen PHQ 2/9 Scores 11/06/2016 11/06/2015  PHQ - 2 Score 0 0    Cognitive Function     6CIT Screen 11/06/2016  What Year? 0 points  What month? 0 points  What time? 0 points  Count back from 20 0 points  Months in reverse 0 points  Repeat phrase 6 points  Total Score 6    Immunization History  Administered Date(s) Administered  . Influenza, High Dose Seasonal PF 07/04/2015, 06/05/2016  . Pneumococcal Conjugate-13 03/10/2014  . Pneumococcal Polysaccharide-23 09/22/1996  . Tdap 09/09/2002, 08/10/2013  . Tetanus 03/09/2015  . Zoster 07/16/2006   Screening Tests Health Maintenance  Topic Date Due  . TETANUS/TDAP  03/08/2025  . INFLUENZA VACCINE  Completed  . ZOSTAVAX  Completed  . PNA vac Low Risk Adult  Completed      Plan:  I have personally reviewed and addressed the Medicare Annual Wellness questionnaire and have noted the following in the patient's chart:  A. Medical and social history B. Use of alcohol, tobacco or illicit drugs  C. Current medications and supplements D. Functional ability and status E.  Nutritional status F.  Physical activity G. Advance directives H. List of other physicians I.  Hospitalizations, surgeries, and ER visits in previous 12 months J.  [place] such as hearing and vision if needed, cognitive and depression L. Referrals and appointments - none  In addition, I have reviewed and discussed with patient certain preventive protocols, quality metrics, and best  practice recommendations. A written personalized care plan for preventive services as well as general preventive health recommendations were provided to patient.  See attached scanned questionnaire for additional information.   Signed,  Fabio Neighbors, LPN Nurse Health Advisor   MD Recommendations: None.  I have reviewed the health advisor's note, was available for consultation, and agree with documentation and plan  Lelon Huh, MD

## 2016-11-06 NOTE — Progress Notes (Signed)
Patient: Roger Cook Male    DOB: March 08, 1927   83 y.o.   MRN: [id number] Visit Date: 11/06/2016  Today's Provider: Lelon Huh, MD   Chief Complaint  Patient presents with  . Hypertension    follow up  . Hypothyroidism    follow up   Subjective:    HPI  Hypertension, follow-up:  BP Readings from Last 3 Encounters:  11/06/16 (!) 142/56  09/28/16 (!) 155/73  08/20/16 (!) 118/54    He was last seen for hypertension 1 years ago.  BP at that visit was 130/70. Management since that visit includes no changes. He reports good compliance with treatment. He is not having side effects.  He is exercising. He is adherent to low salt diet.   Outside blood pressures are 123XX123 (systolic) over 99991111 (diastolic). He is experiencing none.  Patient denies chest pain, chest pressure/discomfort, claudication, dyspnea, exertional chest pressure/discomfort, fatigue, irregular heart beat, lower extremity edema, near-syncope, orthopnea, palpitations, paroxysmal nocturnal dyspnea, syncope and tachypnea.   Cardiovascular risk factors include advanced age (older than 72 for men, 9 for women) and hypertension.  Use of agents associated with hypertension: NSAIDS and thyroid hormones.     Weight trend: decreasing steadily Wt Readings from Last 3 Encounters:  11/06/16 147 lb 3.2 oz (66.8 kg)  09/28/16 150 lb (68 kg)  08/20/16 147 lb (66.7 kg)    Current diet: in general, a "healthy" diet    ------------------------------------------------------------------------ Follow up Hypothyroidism:  Patient was last seen for this problem 1 year ago and no changes were made. Patient reports good compliance with treatment and good tolerance.  Lab Results  Component Value Date   TSH 0.464 11/07/2015      Follow up Arteriosclerosis of Coronary artery:   Patient was last seen for this problem 1 year ago and changes were made. He has been seeing Dr. Clayborn Bigness yearly, but has not required any  intervention since the 1990s. He is wondering if he needs to continue yearly cardiology follow up> Has had n ochest pains, palpitations, or dyspnea. Is taking medication consistently without known adverse effect.   He has had multiple trips to ER over the last several months for constipation. Has started taking 60 mg colace every day and miralax and MOM prn. No blood in stool. Last colonoscopy was in 2013, was normal and no additional screening was recommended.     Allergies  Allergen Reactions  . Levofloxacin     lips swelling  . Povidone Iodine   . Benadryl [Diphenhydramine] Rash     Current Outpatient Prescriptions:  .  ARTIFICIAL TEAR SOLUTION OP, Apply to eye., Disp: , Rfl:  .  aspirin 81 MG tablet, Take by mouth., Disp: , Rfl:  .  atenolol (TENORMIN) 25 MG tablet, Take 1 tablet (25 mg total) by mouth daily., Disp: 90 tablet, Rfl: 4 .  brimonidine-timolol (COMBIGAN) 0.2-0.5 % ophthalmic solution, Place 1 drop into both eyes every 12 (twelve) hours., Disp: , Rfl:  .  docusate sodium (COLACE) 100 MG capsule, Take 100 mg by mouth daily as needed for mild constipation., Disp: , Rfl:  .  fluticasone (FLONASE) 50 MCG/ACT nasal spray, 1 OR 2 SPRAYS IN EACH NOSTRIL EVERY DAY AS NEEDED, Disp: 48 g, Rfl: 5 .  levothyroxine (SYNTHROID, LEVOTHROID) 100 MCG tablet, TAKE ONE TABLET BY MOUTH EVERY DAY, Disp: 90 tablet, Rfl: 4 .  magnesium hydroxide (MILK OF MAGNESIA) 400 MG/5ML suspension, Take by mouth daily as needed for  mild constipation., Disp: , Rfl:  .  MULTIPLE VITAMIN PO, Take by mouth., Disp: , Rfl:  .  NIFEdipine (PROCARDIA-XL/ADALAT CC) 60 MG 24 hr tablet, TAKE ONE TABLET BY MOUTH EVERY DAY, Disp: 90 tablet, Rfl: 4 .  nitrofurantoin, macrocrystal-monohydrate, (MACROBID) 100 MG capsule, Take by mouth., Disp: , Rfl:  .  NON FORMULARY, CPAP (Free Text) - Historical Medication  As directed  Started 22-Sep-1994 Active, Disp: , Rfl:  .  oxybutynin (DITROPAN-XL) 5 MG 24 hr tablet, Take by mouth.,  Disp: , Rfl:  .  polyethylene glycol (MIRALAX / GLYCOLAX) packet, Take 17 g by mouth daily as needed., Disp: , Rfl:  .  pravastatin (PRAVACHOL) 40 MG tablet, TAKE ONE TABLET EVERY DAY, Disp: 90 tablet, Rfl: 4 .  terbinafine (LAMISIL) 250 MG tablet, Take by mouth. Reported on 11/06/2015, Disp: , Rfl:   Review of Systems  Constitutional: Negative for appetite change, chills, fatigue and fever.  HENT: Negative for congestion, ear pain, hearing loss, nosebleeds and trouble swallowing.   Eyes: Negative for pain and visual disturbance.  Respiratory: Negative for cough, chest tightness and shortness of breath.   Cardiovascular: Negative for chest pain, palpitations and leg swelling.  Gastrointestinal: Negative for abdominal pain, blood in stool, constipation, diarrhea, nausea and vomiting.  Endocrine: Negative for polydipsia, polyphagia and polyuria.  Genitourinary: Negative for dysuria and flank pain.  Musculoskeletal: Negative for arthralgias, back pain, joint swelling, myalgias and neck stiffness.  Skin: Negative for color change, rash and wound.  Neurological: Negative for dizziness, tremors, seizures, speech difficulty, weakness, light-headedness and headaches.  Psychiatric/Behavioral: Negative for behavioral problems, confusion, decreased concentration, dysphoric mood and sleep disturbance. The patient is not nervous/anxious.   All other systems reviewed and are negative.   Social History  Substance Use Topics  . Smoking status: Former Smoker    Types: Cigarettes  . Smokeless tobacco: Never Used     Comment: quit in 1963  . Alcohol use 0.6 oz/week    1 Glasses of wine per week   Objective:    Vital Signs - Last Recorded  Most recent update: 11/06/2016 9:51 AM by Fabio Neighbors, LPN  BP    075-GRM (BP Location: Right Arm)     Pulse  60     Temp  97.6 F (36.4 C) (Oral)     Ht  5\' 7"  (1.702 m)     Wt  147 lb 3.2 oz (66.8 kg)      BMI  23.05 kg/m       Physical  Exam   General Appearance:    Alert, cooperative, no distress  Eyes:    PERRL, conjunctiva/corneas clear, EOM's intact       Lungs:     Clear to auscultation bilaterally, respirations unlabored  Heart:    Regular rate and rhythm  Neurologic:   Awake, alert, oriented x 3. No apparent focal neurological           defect.   Skin: .  medium sized non-tener, non-inflamed sebaceous cyst mid back left of midline    EKG: Sinus bradycardia    Assessment & Plan:     1. Arteriosclerosis of coronary artery Asymptomatic. Compliant with medication.  Continue aggressive risk factor modification.  Considering there are no ischemic changes on EKG and he has no cardiac symptoms, it is reasonable to forego routine follow up with cardiology at this time. Continue statin, stay on a betablocker  and daily ECASA.  - EKG 12-Lead - Lipid panel  2. Sebaceous cyst Asymptomatic. Counseled s/s inflammation and infection.   3. Essential hypertension Stable. Continue nifedipine. Change betablocker due to bradycardia as below.  - Renal function panel  4. Acquired hypothyroidism  - T4 AND TSH  5. Constipation, unspecified constipation type Patient Instructions   Start taking OTC metamucil and 100mg  Colace every evening   Save the Miralax for times when you don't have BM for 3 or more days    6. H/O malignant neoplasm of prostate Follow up Dr. Jacqlyn Larsen as scheduled.  - PSA  7. Bradycardia Change beta-bloker from atenolol 1o 1/2 x 25mg  metoprolol daily.        Lelon Huh, MD  [facility]

## 2016-11-07 DIAGNOSIS — I10 Essential (primary) hypertension: Secondary | ICD-10-CM | POA: Diagnosis not present

## 2016-11-07 DIAGNOSIS — I2510 Atherosclerotic heart disease of native coronary artery without angina pectoris: Secondary | ICD-10-CM | POA: Diagnosis not present

## 2016-11-07 DIAGNOSIS — Z8546 Personal history of malignant neoplasm of prostate: Secondary | ICD-10-CM | POA: Diagnosis not present

## 2016-11-07 DIAGNOSIS — E039 Hypothyroidism, unspecified: Secondary | ICD-10-CM | POA: Diagnosis not present

## 2016-11-08 LAB — T4 AND TSH
T4 TOTAL: 6.2 ug/dL (ref 4.5–12.0)
TSH: 0.331 u[IU]/mL — AB (ref 0.450–4.500)

## 2016-11-08 LAB — RENAL FUNCTION PANEL
Albumin: 4.3 g/dL (ref 3.5–4.7)
BUN/Creatinine Ratio: 19 (ref 10–24)
BUN: 24 mg/dL (ref 8–27)
CALCIUM: 9.7 mg/dL (ref 8.6–10.2)
CO2: 25 mmol/L (ref 18–29)
CREATININE: 1.25 mg/dL (ref 0.76–1.27)
Chloride: 107 mmol/L — ABNORMAL HIGH (ref 96–106)
GFR calc Af Amer: 59 mL/min/{1.73_m2} — ABNORMAL LOW (ref >59)
GFR, EST NON AFRICAN AMERICAN: 51 mL/min/{1.73_m2} — AB (ref >59)
Glucose: 112 mg/dL — ABNORMAL HIGH (ref 65–99)
PHOSPHORUS: 4.3 mg/dL (ref 2.5–4.5)
Potassium: 5.2 mmol/L (ref 3.5–5.2)
SODIUM: 145 mmol/L — AB (ref 134–144)

## 2016-11-08 LAB — LIPID PANEL
CHOL/HDL RATIO: 2.9 ratio (ref 0.0–5.0)
CHOLESTEROL TOTAL: 140 mg/dL (ref 100–199)
HDL: 48 mg/dL (ref >39)
LDL Calculated: 66 mg/dL (ref 0–99)
TRIGLYCERIDES: 128 mg/dL (ref 0–149)
VLDL Cholesterol Cal: 26 mg/dL (ref 5–40)

## 2016-11-08 LAB — PSA

## 2016-11-10 ENCOUNTER — Other Ambulatory Visit: Payer: Self-pay | Admitting: Emergency Medicine

## 2016-11-10 ENCOUNTER — Telehealth: Payer: Self-pay | Admitting: Emergency Medicine

## 2016-11-10 MED ORDER — LEVOTHYROXINE SODIUM 88 MCG PO TABS
88.0000 ug | ORAL_TABLET | Freq: Every day | ORAL | 1 refills | Status: DC
Start: 2016-11-10 — End: 2017-02-04

## 2016-11-10 NOTE — Telephone Encounter (Signed)
Sometimes the BP medication that we changed can cause low voltage, but it's not a problem.

## 2016-11-10 NOTE — Telephone Encounter (Signed)
Pt informed

## 2016-11-10 NOTE — Telephone Encounter (Signed)
Spoke with pt about his results. He saw online about his EKG that said low voltage and wanted to make sure his EKG was ok. Please advise.

## 2016-12-16 DIAGNOSIS — N3020 Other chronic cystitis without hematuria: Secondary | ICD-10-CM | POA: Diagnosis not present

## 2016-12-16 DIAGNOSIS — D303 Benign neoplasm of bladder: Secondary | ICD-10-CM | POA: Diagnosis not present

## 2016-12-16 DIAGNOSIS — Z6823 Body mass index (BMI) 23.0-23.9, adult: Secondary | ICD-10-CM | POA: Diagnosis not present

## 2016-12-16 DIAGNOSIS — Z8546 Personal history of malignant neoplasm of prostate: Secondary | ICD-10-CM | POA: Diagnosis not present

## 2016-12-16 DIAGNOSIS — R339 Retention of urine, unspecified: Secondary | ICD-10-CM | POA: Diagnosis not present

## 2016-12-16 DIAGNOSIS — N393 Stress incontinence (female) (male): Secondary | ICD-10-CM | POA: Diagnosis not present

## 2017-01-07 ENCOUNTER — Other Ambulatory Visit: Payer: Self-pay | Admitting: Family Medicine

## 2017-01-08 DIAGNOSIS — D2272 Melanocytic nevi of left lower limb, including hip: Secondary | ICD-10-CM | POA: Diagnosis not present

## 2017-01-08 DIAGNOSIS — X32XXXA Exposure to sunlight, initial encounter: Secondary | ICD-10-CM | POA: Diagnosis not present

## 2017-01-08 DIAGNOSIS — Z85828 Personal history of other malignant neoplasm of skin: Secondary | ICD-10-CM | POA: Diagnosis not present

## 2017-01-08 DIAGNOSIS — K130 Diseases of lips: Secondary | ICD-10-CM | POA: Diagnosis not present

## 2017-01-08 DIAGNOSIS — D2261 Melanocytic nevi of right upper limb, including shoulder: Secondary | ICD-10-CM | POA: Diagnosis not present

## 2017-01-08 DIAGNOSIS — L570 Actinic keratosis: Secondary | ICD-10-CM | POA: Diagnosis not present

## 2017-01-26 ENCOUNTER — Other Ambulatory Visit: Payer: Self-pay | Admitting: Family Medicine

## 2017-02-04 ENCOUNTER — Other Ambulatory Visit: Payer: Self-pay | Admitting: Family Medicine

## 2017-02-05 ENCOUNTER — Encounter: Payer: Self-pay | Admitting: Family Medicine

## 2017-02-05 ENCOUNTER — Ambulatory Visit (INDEPENDENT_AMBULATORY_CARE_PROVIDER_SITE_OTHER): Payer: Medicare Other | Admitting: Family Medicine

## 2017-02-05 VITALS — BP 128/58 | HR 57 | Temp 97.7°F | Resp 16 | Wt 147.0 lb

## 2017-02-05 DIAGNOSIS — I10 Essential (primary) hypertension: Secondary | ICD-10-CM | POA: Diagnosis not present

## 2017-02-05 DIAGNOSIS — I2510 Atherosclerotic heart disease of native coronary artery without angina pectoris: Secondary | ICD-10-CM | POA: Diagnosis not present

## 2017-02-05 DIAGNOSIS — E039 Hypothyroidism, unspecified: Secondary | ICD-10-CM

## 2017-02-05 NOTE — Progress Notes (Signed)
Patient: Roger Cook Male    DOB: 1927/08/24   83 y.o.   MRN: [id number] Visit Date: 02/05/2017  Today's Provider: Lelon Huh, MD   Chief Complaint  Patient presents with  . Hypertension    follow up  . Hypothyroidism    follow up  . Bradycardia    follow up   Subjective:    HPI  Hypertension, follow-up:  BP Readings from Last 3 Encounters:  11/06/16 (!) 142/56  09/28/16 (!) 155/73  08/20/16 (!) 118/54    He was last seen for hypertension 3 months ago.  BP at that visit was 142/56. Management since that visit includes no changes. He reports good compliance with treatment. He is not having side effects.  He is exercising. He is adherent to low salt diet.   Outside blood pressures are rarely checked. He is experiencing none.  Patient denies chest pain, chest pressure/discomfort, claudication, dyspnea, exertional chest pressure/discomfort, fatigue, irregular heart beat, lower extremity edema, near-syncope, orthopnea, palpitations, paroxysmal nocturnal dyspnea, syncope and tachypnea.   Cardiovascular risk factors include advanced age (older than 20 for men, 42 for women), hypertension and male gender.  Use of agents associated with hypertension: NSAIDS.     Weight trend: stable Wt Readings from Last 3 Encounters:  11/06/16 147 lb 3.2 oz (66.8 kg)  09/28/16 150 lb (68 kg)  08/20/16 147 lb (66.7 kg)    Current diet: in general, a "healthy" diet    ------------------------------------------------------------------------ Follow up Hypothyroidism:  Patient was last seen for this problem 3 months ago. Changes made during that visit includes reducing Levothyroxine to 65mcg daily. Patient reports good compliance with treatment and good tolerance.   Follow up Bradycardia:  Patient was last seen for this problem 3 months ago. Changes made during that visit includes changing changing from Atenolol to Metoprolol 25mg  1/2 tablet daily.     Allergies  Allergen  Reactions  . Levofloxacin     lips swelling  . Povidone Iodine   . Benadryl [Diphenhydramine] Rash     Current Outpatient Prescriptions:  .  ARTIFICIAL TEAR SOLUTION OP, Apply to eye., Disp: , Rfl:  .  aspirin 81 MG tablet, Take by mouth., Disp: , Rfl:  .  brimonidine-timolol (COMBIGAN) 0.2-0.5 % ophthalmic solution, Place 1 drop into both eyes every 12 (twelve) hours., Disp: , Rfl:  .  docusate sodium (COLACE) 100 MG capsule, Take 100 mg by mouth daily as needed for mild constipation., Disp: , Rfl:  .  fluticasone (FLONASE) 50 MCG/ACT nasal spray, 1 OR 2 SPRAYS IN EACH NOSTRIL EVERY DAY AS NEEDED, Disp: 48 g, Rfl: 5 .  levothyroxine (SYNTHROID, LEVOTHROID) 88 MCG tablet, TAKE ONE TABLET BY MOUTH EVERY DAY, Disp: 90 tablet, Rfl: 4 .  loratadine (CLARITIN) 10 MG tablet, Take 10 mg by mouth daily., Disp: , Rfl:  .  magnesium hydroxide (MILK OF MAGNESIA) 400 MG/5ML suspension, Take by mouth daily as needed for mild constipation., Disp: , Rfl:  .  metoprolol succinate (TOPROL-XL) 25 MG 24 hr tablet, TAKE 1/2 TABLET BY MOUTH DAILY, REPLACES ATENOLOL, Disp: 45 tablet, Rfl: 5 .  MULTIPLE VITAMIN PO, Take by mouth., Disp: , Rfl:  .  NIFEdipine (PROCARDIA XL/ADALAT-CC) 60 MG 24 hr tablet, TAKE 1 TABLET BY MOUTH DAILY, Disp: 90 tablet, Rfl: 4 .  nitrofurantoin, macrocrystal-monohydrate, (MACROBID) 100 MG capsule, Take by mouth., Disp: , Rfl:  .  NON FORMULARY, CPAP (Free Text) - Historical Medication  As directed  Started  22-Sep-1994 Active, Disp: , Rfl:  .  oxybutynin (DITROPAN-XL) 5 MG 24 hr tablet, Take by mouth., Disp: , Rfl:  .  polyethylene glycol (MIRALAX / GLYCOLAX) packet, Take 17 g by mouth daily as needed., Disp: , Rfl:  .  pravastatin (PRAVACHOL) 40 MG tablet, TAKE 1 TABLET BY MOUTH DAILY, Disp: 90 tablet, Rfl: 4 .  terbinafine (LAMISIL) 250 MG tablet, Take by mouth. Reported on 11/06/2015, Disp: , Rfl:   Review of Systems  Constitutional: Negative for appetite change, chills and fever.    Respiratory: Negative for chest tightness, shortness of breath and wheezing.   Cardiovascular: Negative for chest pain and palpitations.  Gastrointestinal: Negative for abdominal pain, nausea and vomiting.    Social History  Substance Use Topics  . Smoking status: Former Smoker    Types: Cigarettes  . Smokeless tobacco: Never Used     Comment: quit in 1963  . Alcohol use 0.6 oz/week    1 Glasses of wine per week   Objective:   BP (!) 128/58 (BP Location: Left Arm, Patient Position: Sitting, Cuff Size: Normal)   Pulse (!) 57   Temp 97.7 F (36.5 C) (Oral)   Resp 16   Wt 147 lb (66.7 kg)   SpO2 98% Comment: room air  BMI 23.02 kg/m  There were no vitals filed for this visit.   Physical Exam   General Appearance:    Alert, cooperative, no distress  Eyes:    PERRL, conjunctiva/corneas clear, EOM's intact       Lungs:     Clear to auscultation bilaterally, respirations unlabored  Heart:    Regular rate and rhythm  Neurologic:   Awake, alert, oriented x 3. No apparent focal neurological           defect.           Assessment & Plan:     1. Acquired hypothyroidism Doing well with reduction of levothyroxine - T4 AND TSH  2. Essential hypertension Well controlled with good heart rate since change of betablocker  Continue current medications.   Return in about 6 months (around 08/08/2017).        Lelon Huh, MD  [facility]

## 2017-02-06 LAB — T4 AND TSH
T4 TOTAL: 6.4 ug/dL (ref 4.5–12.0)
TSH: 0.96 u[IU]/mL (ref 0.450–4.500)

## 2017-04-23 DIAGNOSIS — H40052 Ocular hypertension, left eye: Secondary | ICD-10-CM | POA: Diagnosis not present

## 2017-06-12 DIAGNOSIS — K130 Diseases of lips: Secondary | ICD-10-CM | POA: Diagnosis not present

## 2017-06-18 ENCOUNTER — Ambulatory Visit (INDEPENDENT_AMBULATORY_CARE_PROVIDER_SITE_OTHER): Payer: Medicare Other

## 2017-06-18 DIAGNOSIS — Z23 Encounter for immunization: Secondary | ICD-10-CM | POA: Diagnosis not present

## 2017-08-03 DIAGNOSIS — K130 Diseases of lips: Secondary | ICD-10-CM | POA: Diagnosis not present

## 2017-08-06 ENCOUNTER — Ambulatory Visit (INDEPENDENT_AMBULATORY_CARE_PROVIDER_SITE_OTHER): Payer: Medicare Other | Admitting: Family Medicine

## 2017-08-06 ENCOUNTER — Encounter: Payer: Self-pay | Admitting: Family Medicine

## 2017-08-06 VITALS — BP 128/58 | HR 60 | Temp 97.7°F | Resp 16 | Wt 149.0 lb

## 2017-08-06 DIAGNOSIS — I10 Essential (primary) hypertension: Secondary | ICD-10-CM | POA: Diagnosis not present

## 2017-08-06 DIAGNOSIS — K5900 Constipation, unspecified: Secondary | ICD-10-CM | POA: Diagnosis not present

## 2017-08-06 DIAGNOSIS — E039 Hypothyroidism, unspecified: Secondary | ICD-10-CM | POA: Diagnosis not present

## 2017-08-06 DIAGNOSIS — I2510 Atherosclerotic heart disease of native coronary artery without angina pectoris: Secondary | ICD-10-CM

## 2017-08-06 MED ORDER — DOCUSATE SODIUM 100 MG PO CAPS
100.0000 mg | ORAL_CAPSULE | Freq: Every day | ORAL | 1 refills | Status: DC
Start: 2017-08-06 — End: 2018-01-28

## 2017-08-06 MED ORDER — SENNOSIDES-DOCUSATE SODIUM 8.6-50 MG PO TABS: 1.0000 | ORAL_TABLET | Freq: Every evening | ORAL | 0 refills | Status: DC | PRN

## 2017-08-06 NOTE — Patient Instructions (Addendum)
   Change from current stool softener/laxative to Docusate 100mg  once a day.    Continue daily metamucil   Take the stool softener/laxative only if you don't have BM for more than 2 days.     The CDC recommends two doses of Shingrix (the shingles vaccine) separated by 2 to 6 months for adults age 81 years and older. I recommend checking with your insurance plan regarding coverage for this vaccine.

## 2017-08-06 NOTE — Progress Notes (Signed)
Patient: Roger Cook Male    DOB: July 16, 1927   83 y.o.   MRN: [id number] Visit Date: 08/06/2017  Today's Provider: Lelon Huh, MD   Chief Complaint  Patient presents with  . Hypertension    follow up  . Hypothyroidism    follow up   Subjective:    HPI   Hypertension, follow-up:  BP Readings from Last 3 Encounters:  02/05/17 (!) 128/58  11/06/16 (!) 142/56  09/28/16 (!) 155/73    He was last seen for hypertension 6 months ago.  BP at that visit was 128/58. Management since that visit includes; no changes.He reports good compliance with treatment. He is not having side effects.  He is exercising. He is adherent to low salt diet.   Outside blood pressures are rarely checked. He is experiencing none.  Patient denies chest pain, chest pressure/discomfort, claudication, dyspnea, exertional chest pressure/discomfort, fatigue, irregular heart beat, lower extremity edema, near-syncope, orthopnea, palpitations, paroxysmal nocturnal dyspnea, syncope and tachypnea.   Cardiovascular risk factors include advanced age (older than 13 for men, 35 for women), hypertension and male gender.  Use of agents associated with hypertension: NSAIDS.   ------------------------------------------------------------------------  Acquired hypothyroidism From 02/05/2017-labs checked, no changes. Patient reports good compliance with treatment and good tolerance.  Lab Results  Component Value Date   TSH 0.960 02/05/2017    Complains of trouble with bowel movement.  Had been taking docusate and sennosides for about 6 months to help with constipation which had been effective, but starting having trouble controlling loose bowel movements about a week ago. He has since started taking OTC metamucil which has provided some relief, but is not sure what he should take for chronic constipation.    Allergies  Allergen Reactions  . Levofloxacin     lips swelling  . Povidone Iodine   . Benadryl  [Diphenhydramine] Rash     Current Outpatient Medications:  .  ARTIFICIAL TEAR SOLUTION OP, Apply to eye., Disp: , Rfl:  .  aspirin 81 MG tablet, Take by mouth., Disp: , Rfl:  .  brimonidine-timolol (COMBIGAN) 0.2-0.5 % ophthalmic solution, Place 1 drop into both eyes every 12 (twelve) hours., Disp: , Rfl:  .  fluticasone (FLONASE) 50 MCG/ACT nasal spray, 1 OR 2 SPRAYS IN EACH NOSTRIL EVERY DAY AS NEEDED, Disp: 48 g, Rfl: 5 .  levothyroxine (SYNTHROID, LEVOTHROID) 88 MCG tablet, TAKE ONE TABLET BY MOUTH EVERY DAY, Disp: 90 tablet, Rfl: 4 .  loratadine (CLARITIN) 10 MG tablet, Take 10 mg by mouth daily., Disp: , Rfl:  .  magnesium hydroxide (MILK OF MAGNESIA) 400 MG/5ML suspension, Take by mouth daily as needed for mild constipation., Disp: , Rfl:  .  metoprolol succinate (TOPROL-XL) 25 MG 24 hr tablet, TAKE 1/2 TABLET BY MOUTH DAILY, REPLACES ATENOLOL, Disp: 45 tablet, Rfl: 5 .  MULTIPLE VITAMIN PO, Take by mouth., Disp: , Rfl:  .  NIFEdipine (PROCARDIA XL/ADALAT-CC) 60 MG 24 hr tablet, TAKE 1 TABLET BY MOUTH DAILY, Disp: 90 tablet, Rfl: 4 .  NON FORMULARY, CPAP (Free Text) - Historical Medication  As directed  Started 22-Sep-1994 Active, Disp: , Rfl:  .  oxybutynin (DITROPAN-XL) 5 MG 24 hr tablet, Take by mouth., Disp: , Rfl:  .  polyethylene glycol (MIRALAX / GLYCOLAX) packet, Take 17 g by mouth daily as needed., Disp: , Rfl:  .  pravastatin (PRAVACHOL) 40 MG tablet, TAKE 1 TABLET BY MOUTH DAILY, Disp: 90 tablet, Rfl: 4 .  psyllium (METAMUCIL) 58.6 %  packet, Take 1 packet daily by mouth., Disp: , Rfl:  .  senna-docusate (SENOKOT-S) 8.6-50 MG tablet, Take 1 tablet at bedtime as needed by mouth for mild constipation., Disp: , Rfl:   Review of Systems  Constitutional: Negative for appetite change, chills and fever.  Respiratory: Negative for chest tightness, shortness of breath and wheezing.   Cardiovascular: Negative for chest pain and palpitations.  Gastrointestinal: Negative for abdominal  pain, nausea and vomiting.    Social History   Tobacco Use  . Smoking status: Former Smoker    Types: Cigarettes  . Smokeless tobacco: Never Used  . Tobacco comment: quit in 1963  Substance Use Topics  . Alcohol use: Yes    Alcohol/week: 0.6 oz    Types: 1 Glasses of wine per week    Comment: 1 glass of wine 2 times a week   Objective:   BP (!) 128/58 (BP Location: Left Arm, Patient Position: Sitting, Cuff Size: Normal)   Pulse 60   Temp 97.7 F (36.5 C) (Oral)   Resp 16   Wt 149 lb (67.6 kg)   SpO2 98% Comment: room air  BMI 23.34 kg/m  There were no vitals filed for this visit.   Physical Exam  General appearance: alert, well developed, well nourished, cooperative and in no distress Head: Normocephalic, without obvious abnormality, atraumatic Respiratory: Respirations even and unlabored, normal respiratory rate Extremities: No gross deformities Skin: Skin color, texture, turgor normal. No rashes seen  Psych: Appropriate mood and affect. Neurologic: Mental status: Alert, oriented to person, place, and time, thought content appropriate.     Assessment & Plan:     1. Acquired hypothyroidism Well controlled.  Continue current medications.    2. Essential hypertension Well controlled.  Continue current medications.    3. Constipation, unspecified constipation type He is to stop daily dose of docusate with Senna, and take only docusate up to 100mg  a day. He is to start taking metamucil powder every day, and can take stool soften with senna only if he goes more than 2 days without BM, consider prn Miralax if this is not effective.        Lelon Huh, MD  [facility]

## 2017-09-04 ENCOUNTER — Other Ambulatory Visit: Payer: Self-pay | Admitting: Family Medicine

## 2017-09-08 ENCOUNTER — Ambulatory Visit (INDEPENDENT_AMBULATORY_CARE_PROVIDER_SITE_OTHER): Payer: Medicare Other | Admitting: Family Medicine

## 2017-09-08 ENCOUNTER — Encounter: Payer: Self-pay | Admitting: Family Medicine

## 2017-09-08 VITALS — BP 132/60 | HR 83 | Temp 98.8°F | Resp 16 | Wt 149.0 lb

## 2017-09-08 DIAGNOSIS — I2510 Atherosclerotic heart disease of native coronary artery without angina pectoris: Secondary | ICD-10-CM

## 2017-09-08 DIAGNOSIS — J069 Acute upper respiratory infection, unspecified: Secondary | ICD-10-CM

## 2017-09-08 MED ORDER — AMOXICILLIN 500 MG PO CAPS: 1000.0000 mg | ORAL_CAPSULE | Freq: Two times a day (BID) | ORAL | 0 refills | Status: AC

## 2017-09-08 NOTE — Patient Instructions (Addendum)
   The CDC recommends two doses of Shingrix (the shingles vaccine) separated by 2 to 6 months for adults age 81 years and older. I recommend checking with your insurance plan regarding coverage for this vaccine.    You can take OTC Mucinex (guaifenesin) for chest or sinus congestion and to help your cough

## 2017-09-08 NOTE — Progress Notes (Signed)
Patient: Roger Cook Male    DOB: [birth year]   83 y.o.   MRN: [id number] Visit Date: 09/08/2017  Today's Provider: Lelon Huh, MD   Chief Complaint  Patient presents with  . Cough    x 2 days   Subjective:    Cough  This is a new problem. Episode onset: 2 days ago. The problem has been gradually improving. Cough characteristics: yellow colored sputum. Associated symptoms include a fever (low grade 99.0 this morning), nasal congestion, postnasal drip, rhinorrhea and a sore throat (improved). Pertinent negatives include no chest pain, chills, ear congestion, ear pain, headaches, hemoptysis, myalgias, shortness of breath, sweats or wheezing. Treatments tried: Alka-Seltzer Plus Cold and Sinus. The treatment provided no relief.  Started as hoarseness and sore throat which have improved the last one or two days.  Taken OTC Alka-seltzer cold and cough.     Allergies  Allergen Reactions  . Levofloxacin     lips swelling  . Povidone Iodine   . Benadryl [Diphenhydramine] Rash     Current Outpatient Medications:  .  ARTIFICIAL TEAR SOLUTION OP, Apply to eye., Disp: , Rfl:  .  aspirin 81 MG tablet, Take by mouth., Disp: , Rfl:  .  brimonidine-timolol (COMBIGAN) 0.2-0.5 % ophthalmic solution, Place 1 drop into both eyes every 12 (twelve) hours., Disp: , Rfl:  .  docusate sodium (COLACE) 100 MG capsule, Take 1 capsule (100 mg total) daily by mouth., Disp: 1 capsule, Rfl: 1 .  fluticasone (FLONASE) 50 MCG/ACT nasal spray, PLACE 1-2 SPRAYS IN EACH NOSTRIL EVERY DAY AS NEEDED, Disp: 16 g, Rfl: 6 .  levothyroxine (SYNTHROID, LEVOTHROID) 88 MCG tablet, TAKE ONE TABLET BY MOUTH EVERY DAY, Disp: 90 tablet, Rfl: 4 .  loratadine (CLARITIN) 10 MG tablet, Take 10 mg by mouth daily., Disp: , Rfl:  .  magnesium hydroxide (MILK OF MAGNESIA) 400 MG/5ML suspension, Take by mouth daily as needed for mild constipation., Disp: , Rfl:  .  metoprolol succinate (TOPROL-XL) 25 MG 24 hr tablet, TAKE  1/2 TABLET BY MOUTH DAILY, REPLACES ATENOLOL, Disp: 45 tablet, Rfl: 5 .  MULTIPLE VITAMIN PO, Take by mouth., Disp: , Rfl:  .  NIFEdipine (PROCARDIA XL/ADALAT-CC) 60 MG 24 hr tablet, TAKE 1 TABLET BY MOUTH DAILY, Disp: 90 tablet, Rfl: 4 .  NON FORMULARY, CPAP (Free Text) - Historical Medication  As directed  Started 22-Sep-1994 Active, Disp: , Rfl:  .  oxybutynin (DITROPAN-XL) 5 MG 24 hr tablet, Take by mouth., Disp: , Rfl:  .  polyethylene glycol (MIRALAX / GLYCOLAX) packet, Take 17 g by mouth daily as needed., Disp: , Rfl:  .  pravastatin (PRAVACHOL) 40 MG tablet, TAKE 1 TABLET BY MOUTH DAILY, Disp: 90 tablet, Rfl: 4 .  psyllium (METAMUCIL) 58.6 % packet, Take 1 packet daily by mouth., Disp: , Rfl:  .  senna-docusate (SENOKOT-S) 8.6-50 MG tablet, Take 1 tablet at bedtime as needed by mouth. Take if no BM for more than two days, Disp: 1 tablet, Rfl: 0  Review of Systems  Constitutional: Positive for fever (low grade 99.0 this morning). Negative for appetite change and chills.  HENT: Positive for congestion, postnasal drip, rhinorrhea, sore throat (improved) and voice change. Negative for ear pain.   Respiratory: Positive for cough. Negative for hemoptysis, chest tightness, shortness of breath and wheezing.   Cardiovascular: Negative for chest pain and palpitations.  Gastrointestinal: Negative for abdominal pain, nausea and vomiting.  Musculoskeletal: Negative for myalgias.  Neurological: Negative  for headaches.    Social History   Tobacco Use  . Smoking status: Former Smoker    Types: Cigarettes  . Smokeless tobacco: Never Used  . Tobacco comment: quit in 1963  Substance Use Topics  . Alcohol use: Yes    Alcohol/week: 0.6 oz    Types: 1 Glasses of wine per week    Comment: 1 glass of wine 2 times a week   Objective:   BP 132/60 (BP Location: Right Arm, Patient Position: Sitting, Cuff Size: Normal)   Pulse 83   Temp 98.8 F (37.1 C) (Oral)   Resp 16   Wt 149 lb (67.6 kg)   SpO2  98% Comment: room air  BMI 23.34 kg/m  There were no vitals filed for this visit.   Physical Exam  General Appearance:    Alert, cooperative, no distress  HENT:   bilateral TM normal without fluid or infection, bilateral TM fluid noted, neck without nodes and nasal mucosa pale and congested  Eyes:    PERRL, conjunctiva/corneas clear, EOM's intact       Lungs:     Clear to auscultation bilaterally, respirations unlabored  Heart:    Regular rate and rhythm  Neurologic:   Awake, alert, oriented x 3. No apparent focal neurological           defect.           Assessment & Plan:     1. Upper respiratory tract infection, unspecified type Counseled regarding signs and symptoms of viral and bacterial respiratory infections. Advised him that he can start prescription for antibiotic if he develops any sign of bacterial infection, or if current symptoms last longer than 10 days.         Lelon Huh, MD  [facility]

## 2017-10-22 DIAGNOSIS — H40052 Ocular hypertension, left eye: Secondary | ICD-10-CM | POA: Diagnosis not present

## 2017-11-02 ENCOUNTER — Other Ambulatory Visit: Payer: Self-pay | Admitting: Family Medicine

## 2017-11-19 ENCOUNTER — Emergency Department: Payer: Medicare Other

## 2017-11-19 ENCOUNTER — Emergency Department
Admission: EM | Admit: 2017-11-19 | Discharge: 2017-11-19 | Disposition: A | Discharge status: Other Acute Inpatient Hospital | Payer: Medicare Other | Attending: Emergency Medicine | Admitting: Emergency Medicine

## 2017-11-19 ENCOUNTER — Encounter: Payer: Self-pay | Admitting: Emergency Medicine

## 2017-11-19 DIAGNOSIS — S0990XA Unspecified injury of head, initial encounter: Secondary | ICD-10-CM | POA: Diagnosis not present

## 2017-11-19 DIAGNOSIS — R930 Abnormal findings on diagnostic imaging of skull and head, not elsewhere classified: Secondary | ICD-10-CM | POA: Diagnosis not present

## 2017-11-19 DIAGNOSIS — S37091A Other injury of right kidney, initial encounter: Secondary | ICD-10-CM | POA: Diagnosis not present

## 2017-11-19 DIAGNOSIS — Y92018 Other place in single-family (private) house as the place of occurrence of the external cause: Secondary | ICD-10-CM | POA: Insufficient documentation

## 2017-11-19 DIAGNOSIS — S37061A Major laceration of right kidney, initial encounter: Secondary | ICD-10-CM | POA: Diagnosis not present

## 2017-11-19 DIAGNOSIS — M5030 Other cervical disc degeneration, unspecified cervical region: Secondary | ICD-10-CM | POA: Diagnosis not present

## 2017-11-19 DIAGNOSIS — R109 Unspecified abdominal pain: Secondary | ICD-10-CM | POA: Diagnosis not present

## 2017-11-19 DIAGNOSIS — S37001A Unspecified injury of right kidney, initial encounter: Secondary | ICD-10-CM | POA: Diagnosis not present

## 2017-11-19 DIAGNOSIS — S298XXA Other specified injuries of thorax, initial encounter: Secondary | ICD-10-CM | POA: Diagnosis not present

## 2017-11-19 DIAGNOSIS — S39012A Strain of muscle, fascia and tendon of lower back, initial encounter: Secondary | ICD-10-CM | POA: Diagnosis not present

## 2017-11-19 DIAGNOSIS — S22030A Wedge compression fracture of third thoracic vertebra, initial encounter for closed fracture: Secondary | ICD-10-CM | POA: Diagnosis not present

## 2017-11-19 DIAGNOSIS — K661 Hemoperitoneum: Secondary | ICD-10-CM

## 2017-11-19 DIAGNOSIS — S299XXA Unspecified injury of thorax, initial encounter: Secondary | ICD-10-CM | POA: Insufficient documentation

## 2017-11-19 DIAGNOSIS — Z7982 Long term (current) use of aspirin: Secondary | ICD-10-CM | POA: Diagnosis not present

## 2017-11-19 DIAGNOSIS — S36892A Contusion of other intra-abdominal organs, initial encounter: Secondary | ICD-10-CM | POA: Diagnosis not present

## 2017-11-19 DIAGNOSIS — S3992XA Unspecified injury of lower back, initial encounter: Secondary | ICD-10-CM | POA: Diagnosis not present

## 2017-11-19 DIAGNOSIS — E7800 Pure hypercholesterolemia, unspecified: Secondary | ICD-10-CM | POA: Diagnosis not present

## 2017-11-19 DIAGNOSIS — S37031A Laceration of right kidney, unspecified degree, initial encounter: Secondary | ICD-10-CM | POA: Diagnosis present

## 2017-11-19 DIAGNOSIS — N2889 Other specified disorders of kidney and ureter: Secondary | ICD-10-CM | POA: Diagnosis not present

## 2017-11-19 DIAGNOSIS — Z882 Allergy status to sulfonamides status: Secondary | ICD-10-CM | POA: Diagnosis not present

## 2017-11-19 DIAGNOSIS — R001 Bradycardia, unspecified: Secondary | ICD-10-CM | POA: Diagnosis not present

## 2017-11-19 DIAGNOSIS — Z888 Allergy status to other drugs, medicaments and biological substances status: Secondary | ICD-10-CM | POA: Diagnosis not present

## 2017-11-19 DIAGNOSIS — S37021A Major contusion of right kidney, initial encounter: Secondary | ICD-10-CM | POA: Diagnosis not present

## 2017-11-19 DIAGNOSIS — I10 Essential (primary) hypertension: Secondary | ICD-10-CM | POA: Diagnosis not present

## 2017-11-19 DIAGNOSIS — N182 Chronic kidney disease, stage 2 (mild): Secondary | ICD-10-CM | POA: Diagnosis present

## 2017-11-19 DIAGNOSIS — Z87891 Personal history of nicotine dependence: Secondary | ICD-10-CM | POA: Diagnosis not present

## 2017-11-19 DIAGNOSIS — Y9389 Activity, other specified: Secondary | ICD-10-CM | POA: Diagnosis not present

## 2017-11-19 DIAGNOSIS — J42 Unspecified chronic bronchitis: Secondary | ICD-10-CM | POA: Diagnosis present

## 2017-11-19 DIAGNOSIS — R29890 Loss of height: Secondary | ICD-10-CM | POA: Diagnosis not present

## 2017-11-19 DIAGNOSIS — Y999 Unspecified external cause status: Secondary | ICD-10-CM | POA: Diagnosis not present

## 2017-11-19 DIAGNOSIS — W11XXXA Fall on and from ladder, initial encounter: Secondary | ICD-10-CM | POA: Insufficient documentation

## 2017-11-19 DIAGNOSIS — M47812 Spondylosis without myelopathy or radiculopathy, cervical region: Secondary | ICD-10-CM | POA: Diagnosis not present

## 2017-11-19 DIAGNOSIS — S199XXA Unspecified injury of neck, initial encounter: Secondary | ICD-10-CM | POA: Diagnosis not present

## 2017-11-19 DIAGNOSIS — Z9689 Presence of other specified functional implants: Secondary | ICD-10-CM | POA: Diagnosis not present

## 2017-11-19 DIAGNOSIS — Z8546 Personal history of malignant neoplasm of prostate: Secondary | ICD-10-CM | POA: Diagnosis not present

## 2017-11-19 DIAGNOSIS — M40204 Unspecified kyphosis, thoracic region: Secondary | ICD-10-CM | POA: Diagnosis present

## 2017-11-19 DIAGNOSIS — E039 Hypothyroidism, unspecified: Secondary | ICD-10-CM | POA: Diagnosis present

## 2017-11-19 DIAGNOSIS — M47814 Spondylosis without myelopathy or radiculopathy, thoracic region: Secondary | ICD-10-CM | POA: Diagnosis not present

## 2017-11-19 DIAGNOSIS — S3991XA Unspecified injury of abdomen, initial encounter: Secondary | ICD-10-CM | POA: Insufficient documentation

## 2017-11-19 DIAGNOSIS — M8588 Other specified disorders of bone density and structure, other site: Secondary | ICD-10-CM | POA: Diagnosis not present

## 2017-11-19 DIAGNOSIS — I129 Hypertensive chronic kidney disease with stage 1 through stage 4 chronic kidney disease, or unspecified chronic kidney disease: Secondary | ICD-10-CM | POA: Diagnosis present

## 2017-11-19 DIAGNOSIS — I959 Hypotension, unspecified: Secondary | ICD-10-CM | POA: Diagnosis not present

## 2017-11-19 DIAGNOSIS — M8580 Other specified disorders of bone density and structure, unspecified site: Secondary | ICD-10-CM | POA: Diagnosis not present

## 2017-11-19 DIAGNOSIS — Z79899 Other long term (current) drug therapy: Secondary | ICD-10-CM | POA: Diagnosis not present

## 2017-11-19 DIAGNOSIS — S22029A Unspecified fracture of second thoracic vertebra, initial encounter for closed fracture: Secondary | ICD-10-CM | POA: Diagnosis not present

## 2017-11-19 DIAGNOSIS — S51811A Laceration without foreign body of right forearm, initial encounter: Secondary | ICD-10-CM | POA: Diagnosis not present

## 2017-11-19 DIAGNOSIS — S2231XA Fracture of one rib, right side, initial encounter for closed fracture: Secondary | ICD-10-CM | POA: Diagnosis present

## 2017-11-19 DIAGNOSIS — S3993XA Unspecified injury of pelvis, initial encounter: Secondary | ICD-10-CM | POA: Diagnosis not present

## 2017-11-19 HISTORY — DX: Malignant (primary) neoplasm, unspecified: C80.1

## 2017-11-19 LAB — COMPREHENSIVE METABOLIC PANEL
ALT: 49 U/L (ref 17–63)
AST: 71 U/L — AB (ref 15–41)
Albumin: 3.8 g/dL (ref 3.5–5.0)
Alkaline Phosphatase: 51 U/L (ref 38–126)
Anion gap: 9 (ref 5–15)
BILIRUBIN TOTAL: 0.4 mg/dL (ref 0.3–1.2)
BUN: 25 mg/dL — ABNORMAL HIGH (ref 6–20)
CO2: 24 mmol/L (ref 22–32)
CREATININE: 1.49 mg/dL — AB (ref 0.61–1.24)
Calcium: 8.9 mg/dL (ref 8.9–10.3)
Chloride: 105 mmol/L (ref 101–111)
GFR calc Af Amer: 46 mL/min — ABNORMAL LOW (ref >60)
GFR, EST NON AFRICAN AMERICAN: 40 mL/min — AB (ref >60)
Glucose, Bld: 182 mg/dL — ABNORMAL HIGH (ref 65–99)
Potassium: 4.3 mmol/L (ref 3.5–5.1)
Sodium: 138 mmol/L (ref 135–145)
TOTAL PROTEIN: 7.2 g/dL (ref 6.5–8.1)

## 2017-11-19 LAB — CBC WITH DIFFERENTIAL/PLATELET
BASOS ABS: 0 10*3/uL (ref 0–0.1)
Basophils Relative: 0 %
EOS ABS: 0 10*3/uL (ref 0–0.7)
Eosinophils Relative: 0 %
HCT: 31.9 % — ABNORMAL LOW (ref 40.0–52.0)
Hemoglobin: 10.4 g/dL — ABNORMAL LOW (ref 13.0–18.0)
LYMPHS ABS: 1.1 10*3/uL (ref 1.0–3.6)
Lymphocytes Relative: 8 %
MCH: 30.4 pg (ref 26.0–34.0)
MCHC: 32.7 g/dL (ref 32.0–36.0)
MCV: 93 fL (ref 80.0–100.0)
Monocytes Absolute: 1.4 10*3/uL — ABNORMAL HIGH (ref 0.2–1.0)
Monocytes Relative: 10 %
Neutro Abs: 11.4 10*3/uL — ABNORMAL HIGH (ref 1.4–6.5)
Neutrophils Relative %: 82 %
PLATELETS: 240 10*3/uL (ref 150–440)
RBC: 3.43 MIL/uL — AB (ref 4.40–5.90)
RDW: 14.1 % (ref 11.5–14.5)
WBC: 14 10*3/uL — AB (ref 3.8–10.6)

## 2017-11-19 LAB — PREPARE RBC (CROSSMATCH)

## 2017-11-19 LAB — GLUCOSE, CAPILLARY: Glucose-Capillary: 169 mg/dL — ABNORMAL HIGH (ref 65–99)

## 2017-11-19 MED ORDER — TRANEXAMIC ACID 1000 MG/10ML IV SOLN
500.0000 mg | Freq: Once | INTRAVENOUS | Status: DC
  Filled 2017-11-19: qty 10

## 2017-11-19 MED ORDER — FENTANYL CITRATE (PF) 100 MCG/2ML IJ SOLN: 75.0000 ug | Freq: Once | INTRAMUSCULAR | Status: DC

## 2017-11-19 MED ORDER — LACTATED RINGERS IV SOLN
100.00 | INTRAVENOUS | Status: DC
Start: ? — End: 2017-11-19

## 2017-11-19 MED ORDER — ACETAMINOPHEN 325 MG PO TABS
650.00 mg | ORAL_TABLET | ORAL | Status: DC
Start: ? — End: 2017-11-19

## 2017-11-19 MED ORDER — SODIUM CHLORIDE 0.9 % IV SOLN: 10.0000 mL/h | Freq: Once | INTRAVENOUS | Status: DC

## 2017-11-19 MED ORDER — TRANEXAMIC ACID 1000 MG/10ML IV SOLN
1000.0000 mg | Freq: Once | INTRAVENOUS | Status: DC
  Filled 2017-11-19: qty 10

## 2017-11-19 MED ORDER — GENERIC EXTERNAL MEDICATION
Status: DC
Start: ? — End: 2017-11-19

## 2017-11-19 MED ORDER — IOPAMIDOL (ISOVUE-300) INJECTION 61%
75.0000 mL | Freq: Once | INTRAVENOUS | Status: AC | PRN
  Administered 2017-11-19: 75 mL via INTRAVENOUS

## 2017-11-19 MED ORDER — FENTANYL CITRATE (PF) 100 MCG/2ML IJ SOLN
INTRAMUSCULAR | Status: AC
  Filled 2017-11-19: qty 2

## 2017-11-19 NOTE — ED Notes (Signed)
Signature pad in room not working. Verbal consent for transfer given by patient and patient's wife. Larene Beach, RN present as witness. Patient's clothing, watch, cell phone, wallet, shoes and glasses given to patient's wife.

## 2017-11-19 NOTE — ED Notes (Signed)
Called [facility] transfer center spoke to Ranier  [phone]

## 2017-11-19 NOTE — ED Triage Notes (Addendum)
Pt arrived with wife after mechanical fall roughly a few hours prior to arrival. Pt ambulated with no distress to triage room. While questioning patient in triage patient slumps head and becomes verbally unresponsive. Pt responsive to touch but not able to answer questions. Blood pressure 82/38. First nurse made aware and pt taken directly to room 14. RN and MD made aware.

## 2017-11-19 NOTE — ED Notes (Signed)
15 Minute Post Start Vitals:  Temp: 98.7 BP: 186/67 HR: 80 RR: 18 O2: 100%  Patient denies any shortness of breath, pain, chills or any new symptoms.

## 2017-11-19 NOTE — ED Notes (Signed)
EMTALA reviewed by this RN.  

## 2017-11-19 NOTE — ED Provider Notes (Addendum)
[facility] Emergency Department Provider Note  ____________________________________________   First MD Initiated Contact with Patient 11/19/17 1611     (approximate)  I have reviewed the triage vital signs and the nursing notes.   HISTORY  Chief Complaint Fall   HPI Roger Cook is a 84 y.o. male who self presents to the emergency department with sudden onset severe right flank pain that began roughly half hour prior to arrival when he fell 5 rungs off a ladder.  He was standing on the ladder doing work at home when he slipped and fell landing on his right side.  He was able to get up and ambulate thereafter.  He has severe pain in his right flank worse with ambulation improved when not walking.  He denies numbness or weakness.  His pain is nonradiating.  He did not hit his head.  He denies loss of consciousness.  He denies antecedent chest pain palpitations or shortness of breath.  He does not take any blood thinning medication.  Past Medical History:  Diagnosis Date  . Anemia   . Bell palsy   . Bladder tumor   . CAD (coronary artery disease)   . Cancer ([place])   . Hyperlipidemia   . Hypertension   . Lipoma   . MRSA bacteremia 02/2011  . Sleep apnea   . Thyroid disease    hypothyroidism    Patient Active Problem List   Diagnosis Date Noted  . Constipation 11/06/2016  . Bell's palsy 02/26/2015  . Nephrogenic adenoma of bladder 02/26/2015  . Folliculitis 41/93/7902  . Personal history of methicillin resistant Staphylococcus aureus 02/26/2015  . Fatty tumor 02/26/2015  . Syncope and collapse 02/26/2015  . Fungal infection of nail 02/26/2015  . H/O malignant neoplasm of prostate 02/26/2015  . Altered blood in stool 10/18/2012  . Arteriosclerosis of coronary artery 01/05/2009  . Apnea, sleep 01/05/2009  . Acquired hypothyroidism 01/05/2009  . BP (high blood pressure) 09/22/1998    Past Surgical History:  Procedure Laterality Date  .  APPENDECTOMY  2005  . EYE SURGERY  2005   cataract surgery, also had a macular hole in 2005  . EYE SURGERY  2008   catarac surgery  . HAND SURGERY Left 06/26/2011   Malignancy removed from left hand  . NASAL SEPTUM SURGERY  1986  . NASAL SINUS SURGERY  1990  . penile inplant  1984  . polyp of rectum  2003  . PROSTATE SURGERY  1975   Abdominal, had to have radiation treatment with the procedure  . TONSILLECTOMY      Prior to Admission medications   Medication Sig Start Date End Date Taking? Authorizing Provider  ARTIFICIAL TEAR SOLUTION OP Apply to eye.    [provider]  aspirin 81 MG tablet Take by mouth. 01/05/09   [provider]  brimonidine-timolol (COMBIGAN) 0.2-0.5 % ophthalmic solution Place 1 drop into both eyes every 12 (twelve) hours.    [provider]  docusate sodium (COLACE) 100 MG capsule Take 1 capsule (100 mg total) daily by mouth. 08/06/17   [name], MD  fluticasone [facility]) 50 MCG/ACT nasal spray PLACE 1-2 SPRAYS IN EACH NOSTRIL EVERY DAY AS NEEDED 09/04/17   [name], MD  levothyroxine (SYNTHROID, LEVOTHROID) 88 MCG tablet TAKE ONE TABLET BY MOUTH EVERY DAY 02/04/17   [name], MD  loratadine (CLARITIN) 10 MG tablet Take 10 mg by mouth daily.    [provider]  magnesium hydroxide (MILK OF MAGNESIA) 400 MG/5ML suspension Take by mouth daily as needed for mild constipation.    [provider]  metoprolol succinate (TOPROL-XL) 25 MG 24 hr tablet TAKE 1/2 TABLET BY MOUTH DAILY, REPLACES ATENOLOL 01/26/17   [name], MD  MULTIPLE VITAMIN PO Take by mouth.    [provider]  NIFEdipine (PROCARDIA XL/ADALAT-CC) 60 MG 24 hr tablet TAKE 1 TABLET BY MOUTH DAILY 01/08/17   [name], MD  NON FORMULARY CPAP (Free Text) - Historical Medication  As directed  Started 22-Sep-1994 Active 09/22/1994   [provider]  oxybutynin (DITROPAN-XL) 5 MG 24 hr tablet Take by mouth. 08/29/14    [provider]  polyethylene glycol (MIRALAX / GLYCOLAX) packet Take 17 g by mouth daily as needed.    [provider]  pravastatin (PRAVACHOL) 40 MG tablet TAKE ONE TABLET EVERY DAY 11/02/17   [name], MD  psyllium (METAMUCIL) 58.6 % packet Take 1 packet daily by mouth.    [provider]  senna-docusate (SENOKOT-S) 8.6-50 MG tablet Take 1 tablet at bedtime as needed by mouth. Take if no BM for more than two days 08/06/17   [name], MD    Allergies Levofloxacin; Povidone iodine; and Benadryl [diphenhydramine]  Family History  Problem Relation Age of Onset  . Lung cancer Mother   . Heart disease Father     Social History Social History   Tobacco Use  . Smoking status: Former Smoker    Types: Cigarettes  . Smokeless tobacco: Never Used  . Tobacco comment: quit in 1963  Substance Use Topics  . Alcohol use: Yes    Alcohol/week: 0.6 oz    Types: 1 Glasses of wine per week    Comment: 1 glass of wine 2 times a week  . Drug use: No    Review of Systems Constitutional: No fever/chills Eyes: No visual changes. ENT: No sore throat. Cardiovascular: Denies chest pain. Respiratory: Denies shortness of breath. Gastrointestinal: Positive for abdominal pain.  No nausea, no vomiting.  No diarrhea.  No constipation. Genitourinary: Negative for dysuria. Musculoskeletal: Positive for back pain. Skin: Negative for rash. Neurological: Negative for headaches, focal weakness or numbness.   ____________________________________________   PHYSICAL EXAM:  VITAL SIGNS: ED Triage Vitals  Enc Vitals Group     BP 11/19/17 1609 97/73     Pulse Rate 11/19/17 1555 66     Resp 11/19/17 1555 18     Temp 11/19/17 1555 97.9 F (36.6 C)     Temp Source 11/19/17 1555 Oral     SpO2 11/19/17 1555 99 %     Weight 11/19/17 1609 140 lb (63.5 kg)     Height 11/19/17 1609 5\' 7"  (1.702 m)     Head Circumference --      Peak Flow --      Pain Score --       Pain Loc --      Pain Edu? --      Excl. in [place]? --     Constitutional: Alert and oriented x4 although appears extremely uncomfortable and wincing whenever he attempts to move Eyes: PERRL EOMI. Head: Atraumatic. Nose: No congestion/rhinnorhea. Mouth/Throat: No trismus Neck: No stridor.   Cardiovascular: Normal rate, regular rhythm. Grossly normal heart sounds.  Good peripheral circulation. Respiratory: Normal respiratory effort.  No retractions. Lungs CTAB and moving good air Gastrointestinal: Exquisite tenderness right abdomen with rebound guarding and frank peritonitis Musculoskeletal: No discomfort with  logroll of either leg legs are equal in length no shortening and no internal or external rotation Neurologic:  Normal speech and language. No gross focal neurologic deficits are appreciated. Skin:  Skin is warm, dry and intact. No rash noted. Psychiatric: Mood and affect are normal. Speech and behavior are normal.    ____________________________________________   DIFFERENTIAL includes but not limited to  Myocardial infarction, ventricular tachycardia, mechanical fall, intracerebral hemorrhage, abdominal catastrophe, hip fracture ____________________________________________   LABS (all labs ordered are listed, but only abnormal results are displayed)  Labs Reviewed  GLUCOSE, CAPILLARY - Abnormal; Notable for the following components:      Result Value   Glucose-Capillary 169 (*)    All other components within normal limits  COMPREHENSIVE METABOLIC PANEL - Abnormal; Notable for the following components:   Glucose, Bld 182 (*)    BUN 25 (*)    Creatinine, Ser 1.49 (*)    AST 71 (*)    GFR calc non Af Amer 40 (*)    GFR calc Af Amer 46 (*)    All other components within normal limits  CBC WITH DIFFERENTIAL/PLATELET - Abnormal; Notable for the following components:   WBC 14.0 (*)    RBC 3.43 (*)    Hemoglobin 10.4 (*)    HCT 31.9 (*)    Neutro Abs 11.4 (*)    Monocytes  Absolute 1.4 (*)    All other components within normal limits  PREPARE RBC (CROSSMATCH)  TYPE AND SCREEN    Lab work reviewed by me with no acute disease __________________________________________  EKG  ED ECG REPORT I, Darel Hong, the attending physician, personally viewed and interpreted this ECG.  Date: 11/19/2017 EKG Time:  Rate: 59 Rhythm: Sinus bradycardia QRS Axis: normal Intervals: normal ST/T Wave abnormalities: normal Narrative Interpretation: no evidence of acute ischemia _____________________________________  RADIOLOGY  CT chest abdomen pelvis reviewed by me with large renal hematoma concerning for active hemorrhage on the right ____________________________________________   PROCEDURES  Procedure(s) performed: yes  Angiocath insertion Performed by: Darel Hong  Consent: Verbal consent obtained. Risks and benefits: risks, benefits and alternatives were discussed Time out: Immediately prior to procedure a "time out" was called to verify the correct patient, procedure, equipment, support staff and site/side marked as required.  Preparation: Patient was prepped and draped in the usual sterile fashion.  Vein Location: right AC  Ultrasound Guided  Gauge: 18  Normal blood return and flush without difficulty Patient tolerance: Patient tolerated the procedure well with no immediate complications.     .Critical Care Performed by: Darel Hong, MD Authorized by: Darel Hong, MD   Critical care provider statement:    Critical care time (minutes):  40   Critical care time was exclusive of:  Separately billable procedures and treating other patients   Critical care was time spent personally by me on the following activities:  Development of treatment plan with patient or surrogate, discussions with consultants, evaluation of patient's response to treatment, examination of patient, obtaining history from patient or surrogate, ordering and  performing treatments and interventions, ordering and review of laboratory studies, ordering and review of radiographic studies, pulse oximetry, re-evaluation of patient's condition and review of old charts   Ultrasound ED FAST Date/Time: 11/25/2017 5:30 PM Performed by: Darel Hong, MD Authorized by: Darel Hong, MD  Procedure details:    Indications: blunt abdominal trauma      Assess for:  Hemothorax, intra-abdominal fluid and pericardial effusion    Technique:  Abdominal and  cardiac    Images: not archived      Abdominal findings:    L kidney:  Visualized   R kidney:  Visualized   Liver:  Visualized   Bladder:  Visualized,    Hepatorenal space visualized: identified     Splenorenal space: identified     Rectovesical free fluid: not identified     Splenorenal free fluid: not identified     Hepatorenal space free fluid: not identified   Cardiac findings:    Heart:  Visualized   Wall motion: identified     Pericardial effusion: not identified      Critical Care performed: Yes  Observation: no ____________________________________________   INITIAL IMPRESSION / ASSESSMENT AND PLAN / ED COURSE  Pertinent labs & imaging results that were available during my care of the patient were reviewed by me and considered in my medical decision making (see chart for details).  The patient was brought emergently back from triage secondary to a syncopal event when he arrived in the emergency department.  He did have a fall down 5 rungs from his ladder and he has borderline low blood pressure along with exquisite discomfort in his abdomen raising concern for intra-abdominal catastrophe secondary to trauma.  IV established and he requires a CT scan with IV contrast of his chest abdomen pelvis.  I performed a bedside FAST exam which is negative.  I was called by radiology that the patient has what appears to be a traumatic versus nontraumatic hemorrhage on his right inferior pole of  his kidney with large hematoma.  I was occupied with another critical patient so asked my colleague Dr. Quentin Cornwall to discuss with the family which trauma center they would prefer to go to and they have opted for the [facility] [place].  We reached out and they accepted as an automatic trauma acceptance.  Dr. Quentin Cornwall emergently transfuse the patient 1 unit of O+ blood and gave 1 g of tranexamic acid.  I subsequently discussed with the patient and family who verbalized understanding and agreement with the plan.  He is transferred in critical condition.      ____________________________________________   FINAL CLINICAL IMPRESSION(S) / ED DIAGNOSES  Final diagnoses:  Retroperitoneal hematoma  Laceration of right kidney, initial encounter      NEW MEDICATIONS STARTED DURING THIS VISIT:  Discharge Medication List as of 11/19/2017  7:44 PM       Note:  This document was prepared using Dragon voice recognition software and may include unintentional dictation errors.     Darel Hong, MD 11/20/17 2206    Darel Hong, MD 11/25/17 1731

## 2017-11-19 NOTE — ED Notes (Signed)
[place] at bedside. Report given to [facility], Therapist, sports.

## 2017-11-19 NOTE — ED Triage Notes (Signed)
Pt arrived with wife after mechanical fall down

## 2017-11-19 NOTE — ED Notes (Addendum)
First unit of emergency blood started at 1815. Verified by Adele Barthel., RN.    Unit: [id number] Type: O+ Volume: 280 ml Expiration: 12/10/2017  Pre-Blood Vitals: Temp 99.9 BP: 153/66 HR: 97 RR: 16 O2: 100%

## 2017-11-19 NOTE — ED Notes (Signed)
Tranexamic Acid 1,000 mg given by slow IV push over 10 minutes. Medication dosage and amount verified by Dr. Quentin Cornwall and Larene Beach, [place].

## 2017-11-20 LAB — BPAM RBC
BLOOD PRODUCT EXPIRATION DATE: 201903212359
Blood Product Expiration Date: 201903182359
ISSUE DATE / TIME: 201902281755
ISSUE DATE / TIME: 201902281755
UNIT TYPE AND RH: 5100
UNIT TYPE AND RH: 5100

## 2017-11-20 LAB — TYPE AND SCREEN
ABO/RH(D): AB POS
Antibody Screen: NEGATIVE
Unit division: 0
Unit division: 0

## 2017-11-20 MED ORDER — BACITRACIN 500 UNIT/GM EX OINT
TOPICAL_OINTMENT | CUTANEOUS | Status: DC
Start: 2017-11-22 — End: 2017-11-20

## 2017-11-20 MED ORDER — DOCUSATE SODIUM 100 MG PO CAPS
100.00 mg | ORAL_CAPSULE | ORAL | Status: DC
Start: 2017-11-23 — End: 2017-11-20

## 2017-11-20 MED ORDER — ATENOLOL 50 MG PO TABS
50.00 mg | ORAL_TABLET | ORAL | Status: DC
Start: 2017-11-23 — End: 2017-11-20

## 2017-11-20 MED ORDER — LEVOTHYROXINE SODIUM 100 MCG PO TABS
100.00 | ORAL_TABLET | ORAL | Status: DC
Start: 2017-11-23 — End: 2017-11-20

## 2017-11-20 MED ORDER — PRAVASTATIN SODIUM 40 MG PO TABS
40.00 mg | ORAL_TABLET | ORAL | Status: DC
Start: 2017-11-22 — End: 2017-11-20

## 2017-11-20 MED ORDER — GENERIC EXTERNAL MEDICATION
Status: DC
Start: ? — End: 2017-11-20

## 2017-11-20 MED ORDER — FLUTICASONE PROPIONATE 50 MCG/ACT NA SUSP
1.00 | NASAL | Status: DC
Start: 2017-11-23 — End: 2017-11-20

## 2017-11-20 MED ORDER — GENERIC EXTERNAL MEDICATION
50.00 mg | Status: DC
Start: 2017-11-20 — End: 2017-11-20

## 2017-11-20 MED ORDER — OXYBUTYNIN CHLORIDE 5 MG PO TABS
5.00 mg | ORAL_TABLET | ORAL | Status: DC
Start: 2017-11-22 — End: 2017-11-20

## 2017-11-21 MED ORDER — GENERIC EXTERNAL MEDICATION
Status: DC
Start: ? — End: 2017-11-21

## 2017-11-22 MED ORDER — GENERIC EXTERNAL MEDICATION
Status: DC
Start: ? — End: 2017-11-22

## 2017-11-25 ENCOUNTER — Ambulatory Visit (INDEPENDENT_AMBULATORY_CARE_PROVIDER_SITE_OTHER): Payer: Medicare Other | Admitting: Family Medicine

## 2017-11-25 ENCOUNTER — Encounter: Payer: Self-pay | Admitting: Family Medicine

## 2017-11-25 VITALS — BP 120/50 | HR 74 | Temp 98.0°F | Resp 16 | Wt 151.0 lb

## 2017-11-25 DIAGNOSIS — S51811A Laceration without foreign body of right forearm, initial encounter: Secondary | ICD-10-CM

## 2017-11-25 DIAGNOSIS — S22000A Wedge compression fracture of unspecified thoracic vertebra, initial encounter for closed fracture: Secondary | ICD-10-CM

## 2017-11-25 DIAGNOSIS — S37031A Laceration of right kidney, unspecified degree, initial encounter: Secondary | ICD-10-CM

## 2017-11-25 DIAGNOSIS — D649 Anemia, unspecified: Secondary | ICD-10-CM

## 2017-11-25 DIAGNOSIS — S2231XA Fracture of one rib, right side, initial encounter for closed fracture: Secondary | ICD-10-CM

## 2017-11-25 NOTE — Progress Notes (Signed)
Patient: Roger Cook Male    DOB: Sep 10, 1927   84 y.o.   MRN: [id number] Visit Date: 11/25/2017  Today's Provider: Lelon Huh, MD   Chief Complaint  Patient presents with  . Hospitalization Follow-up   Subjective:    HPI   Follow up Hospitalization  Patient was admitted to [facility] on 11/19/2017 and discharged on 11/22/2017. He was treated for: Trauma from fall from ladder onto concrete causing right11th rib fracture, right renal laceration. Per discharge summary. Patient is to follow up with urology in 4-6 weeks for renal injury and also follow up with Trauma surgery prn.  He states pain is much better. Was also found to have thoracic vertebral compression fracture. He denies having any back pain at this time. Does have a skin tear of right forearm which he has been keeping covered and applying OTC triple antibiotic oint every day.   Telephone follow up was done on 11/23/2017 He reports good compliance with treatment. He reports this condition is Improved.  ----------------------------------------------------------------   Patient states he feels much improved. However he is still having pain in right rib and hip areas. Also patient has an open abrasion on right forearm. Patient has been using Bactroban ointment on wound and is keeping it covered with gauze.    Allergies  Allergen Reactions  . Levofloxacin     lips swelling  . Povidone Iodine   . Benadryl [Diphenhydramine] Rash     Current Outpatient Medications:  .  ARTIFICIAL TEAR SOLUTION OP, Apply to eye., Disp: , Rfl:  .  aspirin 81 MG tablet, Take by mouth., Disp: , Rfl:  .  brimonidine-timolol (COMBIGAN) 0.2-0.5 % ophthalmic solution, Place 1 drop into both eyes every 12 (twelve) hours., Disp: , Rfl:  .  fluticasone (FLONASE) 50 MCG/ACT nasal spray, PLACE 1-2 SPRAYS IN EACH NOSTRIL EVERY DAY AS NEEDED, Disp: 16 g, Rfl: 6 .  levothyroxine (SYNTHROID, LEVOTHROID) 88 MCG tablet, TAKE ONE TABLET BY MOUTH EVERY  DAY, Disp: 90 tablet, Rfl: 4 .  loratadine (CLARITIN) 10 MG tablet, Take 10 mg by mouth daily., Disp: , Rfl:  .  magnesium hydroxide (MILK OF MAGNESIA) 400 MG/5ML suspension, Take by mouth daily as needed for mild constipation., Disp: , Rfl:  .  metoprolol succinate (TOPROL-XL) 25 MG 24 hr tablet, TAKE 1/2 TABLET BY MOUTH DAILY, REPLACES ATENOLOL, Disp: 45 tablet, Rfl: 5 .  MULTIPLE VITAMIN PO, Take by mouth., Disp: , Rfl:  .  NIFEdipine (PROCARDIA XL/ADALAT-CC) 60 MG 24 hr tablet, TAKE 1 TABLET BY MOUTH DAILY, Disp: 90 tablet, Rfl: 4 .  NON FORMULARY, CPAP (Free Text) - Historical Medication  As directed  Started 22-Sep-1994 Active, Disp: , Rfl:  .  oxybutynin (DITROPAN-XL) 5 MG 24 hr tablet, Take by mouth., Disp: , Rfl:  .  polyethylene glycol (MIRALAX / GLYCOLAX) packet, Take 17 g by mouth daily as needed., Disp: , Rfl:  .  pravastatin (PRAVACHOL) 40 MG tablet, TAKE ONE TABLET EVERY DAY, Disp: 90 tablet, Rfl: 4 .  psyllium (METAMUCIL) 58.6 % packet, Take 1 packet daily by mouth., Disp: , Rfl:  .  senna-docusate (SENOKOT-S) 8.6-50 MG tablet, Take 1 tablet at bedtime as needed by mouth. Take if no BM for more than two days, Disp: 1 tablet, Rfl: 0 .  docusate sodium (COLACE) 100 MG capsule, Take 1 capsule (100 mg total) daily by mouth. (Patient not taking: Reported on 11/25/2017), Disp: 1 capsule, Rfl: 1  Review of Systems  Constitutional: Negative for appetite change, chills and fever.  Respiratory: Negative for chest tightness, shortness of breath and wheezing.   Cardiovascular: Negative for chest pain and palpitations.  Gastrointestinal: Negative for abdominal pain, nausea and vomiting.    Social History   Tobacco Use  . Smoking status: Former Smoker    Types: Cigarettes  . Smokeless tobacco: Never Used  . Tobacco comment: quit in 1963  Substance Use Topics  . Alcohol use: Yes    Alcohol/week: 0.6 oz    Types: 1 Glasses of wine per week    Comment: 1 glass of wine 2 times a week    Objective:   BP (!) 120/50 (BP Location: Left Arm, Patient Position: Sitting, Cuff Size: Normal)   Pulse 74   Temp 98 F (36.7 C) (Oral)   Resp 16   Wt 151 lb (68.5 kg)   SpO2 98%   BMI 23.65 kg/m  Vitals:   11/25/17 1455  BP: (!) 120/50  Pulse: 74  Resp: 16  Temp: 98 F (36.7 C)  TempSrc: Oral  SpO2: 98%  Weight: 151 lb (68.5 kg)     Physical Exam   General Appearance:    Alert, cooperative, no distress  Eyes:    PERRL, conjunctiva/corneas clear, EOM's intact       Lungs:     Clear to auscultation bilaterally, respirations unlabored  Heart:    Regular rate and rhythm  Neurologic:   Awake, alert, oriented x 3. No apparent focal neurological           defect.   MS:    No spine tenderness. Mild tenderness right posterior rib with moderate subcutaneous contusion.   Skin:    About 5x3cm skin tear dorsum of right forearm, no discharge or bleeding. No surrounding erythema.        Assessment & Plan:     1. Anemia, unspecified type  - CBC  2. Closed compression fracture of thoracic vertebra, initial encounter ([place]) Unclear if this is incidental finding or related to fall - DG Bone Density; Future  3. Skin tear of forearm without complication, right, initial encounter Healing well. Continue current   4. Closed fracture of one rib of right side, initial encounter Minimally symptomatic.   5. Renal laceration Hemodynamically stable. No symptoms of blood in urine. Follow up urology as scheduled.        Lelon Huh, MD  [facility]

## 2017-11-26 LAB — CBC
Hematocrit: 31.6 % — ABNORMAL LOW (ref 37.5–51.0)
Hemoglobin: 10.4 g/dL — ABNORMAL LOW (ref 13.0–17.7)
MCH: 30.1 pg (ref 26.6–33.0)
MCHC: 32.9 g/dL (ref 31.5–35.7)
MCV: 91 fL (ref 79–97)
PLATELETS: 274 10*3/uL (ref 150–379)
RBC: 3.46 x10E6/uL — AB (ref 4.14–5.80)
RDW: 15.2 % (ref 12.3–15.4)
WBC: 8.6 10*3/uL (ref 3.4–10.8)

## 2017-11-30 ENCOUNTER — Telehealth: Payer: Self-pay | Admitting: Family Medicine

## 2017-11-30 NOTE — Telephone Encounter (Signed)
Please advise 

## 2017-11-30 NOTE — Telephone Encounter (Signed)
Since patient had an accident a few weeks ago, he wants to know when he can start walking up to 2 miles a day?

## 2017-11-30 NOTE — Telephone Encounter (Signed)
Patient was advised.  

## 2017-11-30 NOTE — Telephone Encounter (Signed)
He should wait about 2 weeks to allow ribs to heal, then start with short walks of 1-2 blocks and gradually increase to mile over 3-4 weeks.

## 2017-12-22 ENCOUNTER — Ambulatory Visit
Admission: RE | Admit: 2017-12-22 | Discharge: 2017-12-22 | Disposition: A | Discharge status: Home / Self Care | Payer: Medicare Other | Source: Ambulatory Visit | Attending: Family Medicine | Admitting: Family Medicine

## 2017-12-22 DIAGNOSIS — S22000A Wedge compression fracture of unspecified thoracic vertebra, initial encounter for closed fracture: Secondary | ICD-10-CM | POA: Diagnosis not present

## 2017-12-22 DIAGNOSIS — M8588 Other specified disorders of bone density and structure, other site: Secondary | ICD-10-CM | POA: Diagnosis not present

## 2017-12-22 DIAGNOSIS — Z13820 Encounter for screening for osteoporosis: Secondary | ICD-10-CM | POA: Diagnosis not present

## 2017-12-22 DIAGNOSIS — X58XXXA Exposure to other specified factors, initial encounter: Secondary | ICD-10-CM | POA: Insufficient documentation

## 2017-12-22 DIAGNOSIS — M8589 Other specified disorders of bone density and structure, multiple sites: Secondary | ICD-10-CM | POA: Diagnosis not present

## 2017-12-23 ENCOUNTER — Telehealth: Payer: Self-pay | Admitting: Family Medicine

## 2017-12-23 ENCOUNTER — Encounter: Payer: Self-pay | Admitting: Family Medicine

## 2017-12-23 DIAGNOSIS — M816 Localized osteoporosis [Lequesne]: Secondary | ICD-10-CM | POA: Insufficient documentation

## 2017-12-23 DIAGNOSIS — M810 Age-related osteoporosis without current pathological fracture: Secondary | ICD-10-CM

## 2017-12-23 NOTE — Telephone Encounter (Signed)
Patient returned call ans stated that he wants to research alendronate before he decides to take it. He will let us know his decision.

## 2017-12-23 NOTE — Telephone Encounter (Signed)
Pt returned call ° °teri °

## 2017-12-24 DIAGNOSIS — N281 Cyst of kidney, acquired: Secondary | ICD-10-CM | POA: Diagnosis not present

## 2017-12-24 DIAGNOSIS — Z720 Tobacco use: Secondary | ICD-10-CM | POA: Diagnosis not present

## 2017-12-24 DIAGNOSIS — Z801 Family history of malignant neoplasm of trachea, bronchus and lung: Secondary | ICD-10-CM | POA: Diagnosis not present

## 2017-12-24 DIAGNOSIS — Z8546 Personal history of malignant neoplasm of prostate: Secondary | ICD-10-CM | POA: Diagnosis not present

## 2017-12-24 DIAGNOSIS — S37031D Laceration of right kidney, unspecified degree, subsequent encounter: Secondary | ICD-10-CM | POA: Diagnosis not present

## 2017-12-24 DIAGNOSIS — J42 Unspecified chronic bronchitis: Secondary | ICD-10-CM | POA: Diagnosis not present

## 2017-12-24 DIAGNOSIS — N182 Chronic kidney disease, stage 2 (mild): Secondary | ICD-10-CM | POA: Diagnosis not present

## 2017-12-24 DIAGNOSIS — Z882 Allergy status to sulfonamides status: Secondary | ICD-10-CM | POA: Diagnosis not present

## 2017-12-24 DIAGNOSIS — M8580 Other specified disorders of bone density and structure, unspecified site: Secondary | ICD-10-CM | POA: Diagnosis not present

## 2017-12-24 DIAGNOSIS — S37001A Unspecified injury of right kidney, initial encounter: Secondary | ICD-10-CM | POA: Diagnosis not present

## 2017-12-25 ENCOUNTER — Telehealth: Payer: Self-pay | Admitting: Emergency Medicine

## 2017-12-25 NOTE — Telephone Encounter (Signed)
Pt want to let Sharyn Lull know that he researched the Fosamax and he does not want to take that medication.

## 2018-01-07 DIAGNOSIS — L821 Other seborrheic keratosis: Secondary | ICD-10-CM | POA: Diagnosis not present

## 2018-01-07 DIAGNOSIS — D2262 Melanocytic nevi of left upper limb, including shoulder: Secondary | ICD-10-CM | POA: Diagnosis not present

## 2018-01-07 DIAGNOSIS — Z08 Encounter for follow-up examination after completed treatment for malignant neoplasm: Secondary | ICD-10-CM | POA: Diagnosis not present

## 2018-01-07 DIAGNOSIS — L570 Actinic keratosis: Secondary | ICD-10-CM | POA: Diagnosis not present

## 2018-01-07 DIAGNOSIS — D2271 Melanocytic nevi of right lower limb, including hip: Secondary | ICD-10-CM | POA: Diagnosis not present

## 2018-01-07 DIAGNOSIS — Z85828 Personal history of other malignant neoplasm of skin: Secondary | ICD-10-CM | POA: Diagnosis not present

## 2018-01-07 DIAGNOSIS — D2272 Melanocytic nevi of left lower limb, including hip: Secondary | ICD-10-CM | POA: Diagnosis not present

## 2018-01-07 DIAGNOSIS — X32XXXA Exposure to sunlight, initial encounter: Secondary | ICD-10-CM | POA: Diagnosis not present

## 2018-01-07 DIAGNOSIS — D2261 Melanocytic nevi of right upper limb, including shoulder: Secondary | ICD-10-CM | POA: Diagnosis not present

## 2018-01-07 DIAGNOSIS — D225 Melanocytic nevi of trunk: Secondary | ICD-10-CM | POA: Diagnosis not present

## 2018-01-11 ENCOUNTER — Other Ambulatory Visit: Payer: Self-pay | Admitting: Family Medicine

## 2018-01-28 ENCOUNTER — Ambulatory Visit (INDEPENDENT_AMBULATORY_CARE_PROVIDER_SITE_OTHER): Payer: Medicare Other

## 2018-01-28 VITALS — BP 142/50 | HR 60 | Temp 98.7°F | Ht 67.0 in | Wt 145.4 lb

## 2018-01-28 DIAGNOSIS — Z0000 Encounter for general adult medical examination without abnormal findings: Secondary | ICD-10-CM

## 2018-01-28 NOTE — Progress Notes (Signed)
Subjective:   Roger Cook is a 84 y.o. male who presents for Medicare Annual/Subsequent preventive examination.  Review of Systems:  N/A  Cardiac Risk Factors include: advanced age (>78men, >66 women);male gender;hypertension     Objective:    Vitals: BP (!) 142/50 (BP Location: Right Arm)   Pulse 60   Temp 98.7 F (37.1 C) (Oral)   Ht 5\' 7"  (1.702 m)   Wt 145 lb 6.4 oz (66 kg)   BMI 22.77 kg/m   Body mass index is 22.77 kg/m.  Advanced Directives 01/28/2018 11/19/2017 11/06/2016 09/28/2016 08/10/2016  Does Patient Have a Medical Advance Directive? Yes Yes Yes Yes No  Type of Paramedic of [place];Living will [place];Living will [place];Living will Living will -  Copy of [place] in Chart? No - copy requested - No - copy requested - -  Would patient like information on creating a medical advance directive? - - - - No - patient declined information    Tobacco Social History   Tobacco Use  Smoking Status Former Smoker  . Types: Cigarettes  Smokeless Tobacco Never Used  Tobacco Comment   quit in 1963     Counseling given: Not Answered Comment: quit in 1963   Clinical Intake:  Pre-visit preparation completed: Yes  Pain : No/denies pain Pain Score: 0-No pain     Nutritional Status: BMI of 19-24  Normal Nutritional Risks: None Diabetes: No  How often do you need to have someone help you when you read instructions, pamphlets, or other written materials from your doctor or pharmacy?: 1 - Never  Interpreter Needed?: No  Information entered by :: [facility], LPN  Past Medical History:  Diagnosis Date  . Anemia   . Bell palsy   . Bladder tumor   . CAD (coronary artery disease)   . Cancer ([place])   . Hyperlipidemia   . Hypertension   . Lipoma   . MRSA bacteremia 02/2011  . Sleep apnea   . Thyroid disease    hypothyroidism   Past Surgical History:  Procedure  Laterality Date  . APPENDECTOMY  2005  . EYE SURGERY  2005   cataract surgery, also had a macular hole in 2005  . EYE SURGERY  2008   catarac surgery  . HAND SURGERY Left 06/26/2011   Malignancy removed from left hand  . NASAL SEPTUM SURGERY  1986  . NASAL SINUS SURGERY  1990  . penile inplant  1984  . polyp of rectum  2003  . PROSTATE SURGERY  1975   Abdominal, had to have radiation treatment with the procedure  . TONSILLECTOMY     Family History  Problem Relation Age of Onset  . Lung cancer Mother   . Heart disease Father    Social History   Socioeconomic History  . Marital status: Married    Spouse name: Not on file  . Number of children: 2  . Years of education: Not on file  . Highest education level: Bachelor's degree (e.g., BA, AB, BS)  Occupational History  . Occupation: retired  Scientific laboratory technician  . Financial resource strain: Not hard at all  . Food insecurity:    Worry: Never true    Inability: Never true  . Transportation needs:    Medical: No    Non-medical: No  Tobacco Use  . Smoking status: Former Smoker    Types: Cigarettes  . Smokeless tobacco: Never Used  .  Tobacco comment: quit in 1963  Substance and Sexual Activity  . Alcohol use: Yes    Alcohol/week: 1.2 oz    Types: 2 Glasses of wine per week  . Drug use: No  . Sexual activity: Not on file  Lifestyle  . Physical activity:    Days per week: Not on file    Minutes per session: Not on file  . Stress: Not at all  Relationships  . Social connections:    Talks on phone: Not on file    Gets together: Not on file    Attends religious service: Not on file    Active member of club or organization: Not on file    Attends meetings of clubs or organizations: Not on file    Relationship status: Not on file  Other Topics Concern  . Not on file  Social History Narrative  . Not on file    Outpatient Encounter Medications as of 01/28/2018  Medication Sig  . ARTIFICIAL TEAR SOLUTION OP Apply 1 drop to  eye 2 (two) times daily.   Marland Kitchen aspirin 81 MG tablet Take 81 mg by mouth daily.   . brimonidine-timolol (COMBIGAN) 0.2-0.5 % ophthalmic solution Place 1 drop into the left eye every 12 (twelve) hours.   . fluticasone (FLONASE) 50 MCG/ACT nasal spray PLACE 1-2 SPRAYS IN EACH NOSTRIL EVERY DAY AS NEEDED  . levothyroxine (SYNTHROID, LEVOTHROID) 88 MCG tablet TAKE ONE TABLET BY MOUTH EVERY DAY  . loratadine (CLARITIN) 10 MG tablet Take 10 mg by mouth daily.  . magnesium hydroxide (MILK OF MAGNESIA) 400 MG/5ML suspension Take by mouth daily as needed for mild constipation.  . metoprolol succinate (TOPROL-XL) 25 MG 24 hr tablet TAKE 1/2 TABLET BY MOUTH DAILY, REPLACES ATENOLOL  . MULTIPLE VITAMIN PO Take by mouth daily.   Marland Kitchen NIFEdipine (PROCARDIA XL/ADALAT-CC) 60 MG 24 hr tablet TAKE ONE TABLET EVERY DAY  . nitrofurantoin, macrocrystal-monohydrate, (MACROBID) 100 MG capsule Take 100 mg by mouth daily.  . NON FORMULARY CPAP (Free Text) - Historical Medication  As directed  Started 22-Sep-1994 Active  . oxybutynin (DITROPAN-XL) 5 MG 24 hr tablet Take 5 mg by mouth at bedtime.   . polyethylene glycol (MIRALAX / GLYCOLAX) packet Take 17 g by mouth daily as needed.  . pravastatin (PRAVACHOL) 40 MG tablet TAKE ONE TABLET EVERY DAY  . psyllium (METAMUCIL) 58.6 % packet Take 1 packet by mouth daily.   Marland Kitchen senna-docusate (SENOKOT-S) 8.6-50 MG tablet Take 1 tablet at bedtime as needed by mouth. Take if no BM for more than two days (Patient not taking: Reported on 01/28/2018)  . [DISCONTINUED] docusate sodium (COLACE) 100 MG capsule Take 1 capsule (100 mg total) daily by mouth. (Patient not taking: Reported on 11/25/2017)   No facility-administered encounter medications on file as of 01/28/2018.     Activities of Daily Living In your present state of health, do you have any difficulty performing the following activities: 01/28/2018  Hearing? Y  Comment Pt declines referral for audiologist and hearing aids.  Vision? N    Difficulty concentrating or making decisions? N  Walking or climbing stairs? N  Dressing or bathing? N  Doing errands, shopping? N  Preparing Food and eating ? N  Using the Toilet? N  In the past six months, have you accidently leaked urine? Y  Comment Due to hx of prostate removal.  Do you have problems with loss of bowel control? N  Managing your Medications? N  Managing your Finances? N  Housekeeping or managing your Housekeeping? N  Some recent data might be hidden    Patient Care Team: [name], MD as PCP - General (Family Medicine) Birder Robson, MD as Referring Physician (Ophthalmology) Murrell Redden, MD (Urology)   Assessment:   This is a routine wellness examination for Roger Cook.  Exercise Activities and Dietary recommendations Current Exercise Habits: Home exercise routine, Type of exercise: walking, Time (Minutes): 60(walks 2 miles a day ), Frequency (Times/Week): 6, Weekly Exercise (Minutes/Week): 360, Exercise limited by: None identified  Goals    . DIET - INCREASE WATER INTAKE     Recommend increasing water intake to 6 glasses a day.        Fall Risk Fall Risk  01/28/2018 11/06/2016 11/06/2015  Falls in the past year? Yes No No  Number falls in past yr: 2 or more - -  Injury with Fall? Yes - -  Comment kidney injury (?) - -  Follow up Falls prevention discussed - -   Is the patient's home free of loose throw rugs in walkways, pet beds, electrical cords, etc?   yes      Grab bars in the bathroom? no      Handrails on the stairs?   yes      Adequate lighting?   yes  Timed Get Up and Go Performed: N/A  Depression Screen PHQ 2/9 Scores 01/28/2018 11/06/2016 11/06/2015  PHQ - 2 Score 0 0 0    Cognitive Function: Pt declined screening today.      6CIT Screen 11/06/2016  What Year? 0 points  What month? 0 points  What time? 0 points  Count back from 20 0 points  Months in reverse 0 points  Repeat phrase 6 points  Total Score 6    Immunization  History  Administered Date(s) Administered  . Influenza Split 06/19/2009, 06/11/2010, 05/09/2011, 07/01/2012  . Influenza, High Dose Seasonal PF 06/22/2014, 07/04/2015, 06/05/2016, 06/18/2017  . Influenza,inj,Quad PF,6+ Mos 05/28/2013  . Pneumococcal Conjugate-13 03/10/2014  . Pneumococcal Polysaccharide-23 09/22/1996  . Tdap 09/09/2002, 08/10/2013, 03/06/2015  . Zoster 07/16/2006    Qualifies for Shingles Vaccine? Due for Shingles vaccine. Declined my offer to administer today. Education has been provided regarding the importance of this vaccine. Pt has been advised to call her insurance company to determine her out of pocket expense. Advised she may also receive this vaccine at her local pharmacy or Health Dept. Verbalized acceptance and understanding.  Screening Tests Health Maintenance  Topic Date Due  . INFLUENZA VACCINE  04/22/2018  . TETANUS/TDAP  03/05/2025  . PNA vac Low Risk Adult  Completed   Cancer Screenings: Lung: Low Dose CT Chest recommended if Age 28-80 years, 30 pack-year currently smoking OR have quit w/in 15years. Patient does not qualify. Colorectal: Up to date  Additional Screenings:  Hepatitis C Screening:      Plan:  I have personally reviewed and addressed the Medicare Annual Wellness questionnaire and have noted the following in the patient's chart:  A. Medical and social history B. Use of alcohol, tobacco or illicit drugs  C. Current medications and supplements D. Functional ability and status E.  Nutritional status F.  Physical activity G. Advance directives H. List of other physicians I.  Hospitalizations, surgeries, and ER visits in previous 12 months J.  [place] such as hearing and vision if needed, cognitive and depression L. Referrals and appointments - none  In addition, I have reviewed and discussed with patient certain preventive protocols,  quality metrics, and best practice recommendations. A written personalized care plan  for preventive services as well as general preventive health recommendations were provided to patient.  See attached scanned questionnaire for additional information.   Signed,  Fabio Neighbors, LPN Nurse Health Advisor   Nurse Recommendations: None.

## 2018-01-28 NOTE — Patient Instructions (Addendum)
Roger Cook , Thank you for taking time to come for your Medicare Wellness Visit. I appreciate your ongoing commitment to your health goals. Please review the following plan we discussed and let me know if I can assist you in the future.   Screening recommendations/referrals: Colonoscopy: Up to date Recommended yearly ophthalmology/optometry visit for glaucoma screening and checkup Recommended yearly dental visit for hygiene and checkup  Vaccinations: Influenza vaccine: Up to date Pneumococcal vaccine: Up to date Tdap vaccine: Up to date Shingles vaccine: Pt declines today.     Advanced directives: Please bring a copy of your POA (Power of Attorney) and/or Living Will to your next appointment.   Conditions/risks identified: Recommend increasing water intake to 6 glasses a day.   Next appointment: 02/04/18 @ 9 AM with Dr Caryn Section.   Preventive Care 1 Years and Older, Male Preventive care refers to lifestyle choices and visits with your health care provider that can promote health and wellness. What does preventive care include?  A yearly physical exam. This is also called an annual well check.  Dental exams once or twice a year.  Routine eye exams. Ask your health care provider how often you should have your eyes checked.  Personal lifestyle choices, including:  Daily care of your teeth and gums.  Regular physical activity.  Eating a healthy diet.  Avoiding tobacco and drug use.  Limiting alcohol use.  Practicing safe sex.  Taking low doses of aspirin every day.  Taking vitamin and mineral supplements as recommended by your health care provider. What happens during an annual well check? The services and screenings done by your health care provider during your annual well check will depend on your age, overall health, lifestyle risk factors, and family history of disease. Counseling  Your health care provider may ask you questions about your:  Alcohol use.  Tobacco  use.  Drug use.  Emotional well-being.  Home and relationship well-being.  Sexual activity.  Eating habits.  History of falls.  Memory and ability to understand (cognition).  Work and work Statistician. Screening  You may have the following tests or measurements:  Height, weight, and BMI.  Blood pressure.  Lipid and cholesterol levels. These may be checked every 5 years, or more frequently if you are over 1 years old.  Skin check.  Lung cancer screening. You may have this screening every year starting at age 2 if you have a 30-pack-year history of smoking and currently smoke or have quit within the past 15 years.  Fecal occult blood test (FOBT) of the stool. You may have this test every year starting at age 84.  Flexible sigmoidoscopy or colonoscopy. You may have a sigmoidoscopy every 5 years or a colonoscopy every 10 years starting at age 58.  Prostate cancer screening. Recommendations will vary depending on your family history and other risks.  Hepatitis C blood test.  Hepatitis B blood test.  Sexually transmitted disease (STD) testing.  Diabetes screening. This is done by checking your blood sugar (glucose) after you have not eaten for a while (fasting). You may have this done every 1-3 years.  Abdominal aortic aneurysm (AAA) screening. You may need this if you are a current or former smoker.  Osteoporosis. You may be screened starting at age 66 if you are at high risk. Talk with your health care provider about your test results, treatment options, and if necessary, the need for more tests. Vaccines  Your health care provider may recommend certain vaccines, such as:  Influenza vaccine. This is recommended every year.  Tetanus, diphtheria, and acellular pertussis (Tdap, Td) vaccine. You may need a Td booster every 10 years.  Zoster vaccine. You may need this after age 17.  Pneumococcal 13-valent conjugate (PCV13) vaccine. One dose is recommended after age  66.  Pneumococcal polysaccharide (PPSV23) vaccine. One dose is recommended after age 72. Talk to your health care provider about which screenings and vaccines you need and how often you need them. This information is not intended to replace advice given to you by your health care provider. Make sure you discuss any questions you have with your health care provider. Document Released: 10/05/2015 Document Revised: 05/28/2016 Document Reviewed: 07/10/2015 Elsevier Interactive Patient Education  2017 [place] Prevention in the Home Falls can cause injuries. They can happen to people of all ages. There are many things you can do to make your home safe and to help prevent falls. What can I do on the outside of my home?  Regularly fix the edges of walkways and driveways and fix any cracks.  Remove anything that might make you trip as you walk through a door, such as a raised step or threshold.  Trim any bushes or trees on the path to your home.  Use bright outdoor lighting.  Clear any walking paths of anything that might make someone trip, such as rocks or tools.  Regularly check to see if handrails are loose or broken. Make sure that both sides of any steps have handrails.  Any raised decks and porches should have guardrails on the edges.  Have any leaves, snow, or ice cleared regularly.  Use sand or salt on walking paths during winter.  Clean up any spills in your garage right away. This includes oil or grease spills. What can I do in the bathroom?  Use night lights.  Install grab bars by the toilet and in the tub and shower. Do not use towel bars as grab bars.  Use non-skid mats or decals in the tub or shower.  If you need to sit down in the shower, use a plastic, non-slip stool.  Keep the floor dry. Clean up any water that spills on the floor as soon as it happens.  Remove soap buildup in the tub or shower regularly.  Attach bath mats securely with double-sided  non-slip rug tape.  Do not have throw rugs and other things on the floor that can make you trip. What can I do in the bedroom?  Use night lights.  Make sure that you have a light by your bed that is easy to reach.  Do not use any sheets or blankets that are too big for your bed. They should not hang down onto the floor.  Have a firm chair that has side arms. You can use this for support while you get dressed.  Do not have throw rugs and other things on the floor that can make you trip. What can I do in the kitchen?  Clean up any spills right away.  Avoid walking on wet floors.  Keep items that you use a lot in easy-to-reach places.  If you need to reach something above you, use a strong step stool that has a grab bar.  Keep electrical cords out of the way.  Do not use floor polish or wax that makes floors slippery. If you must use wax, use non-skid floor wax.  Do not have throw rugs and other things on the floor that  can make you trip. What can I do with my stairs?  Do not leave any items on the stairs.  Make sure that there are handrails on both sides of the stairs and use them. Fix handrails that are broken or loose. Make sure that handrails are as long as the stairways.  Check any carpeting to make sure that it is firmly attached to the stairs. Fix any carpet that is loose or worn.  Avoid having throw rugs at the top or bottom of the stairs. If you do have throw rugs, attach them to the floor with carpet tape.  Make sure that you have a light switch at the top of the stairs and the bottom of the stairs. If you do not have them, ask someone to add them for you. What else can I do to help prevent falls?  Wear shoes that:  Do not have high heels.  Have rubber bottoms.  Are comfortable and fit you well.  Are closed at the toe. Do not wear sandals.  If you use a stepladder:  Make sure that it is fully opened. Do not climb a closed stepladder.  Make sure that both  sides of the stepladder are locked into place.  Ask someone to hold it for you, if possible.  Clearly mark and make sure that you can see:  Any grab bars or handrails.  First and last steps.  Where the edge of each step is.  Use tools that help you move around (mobility aids) if they are needed. These include:  Canes.  Walkers.  Scooters.  Crutches.  Turn on the lights when you go into a dark area. Replace any light bulbs as soon as they burn out.  Set up your furniture so you have a clear path. Avoid moving your furniture around.  If any of your floors are uneven, fix them.  If there are any pets around you, be aware of where they are.  Review your medicines with your doctor. Some medicines can make you feel dizzy. This can increase your chance of falling. Ask your doctor what other things that you can do to help prevent falls. This information is not intended to replace advice given to you by your health care provider. Make sure you discuss any questions you have with your health care provider. Document Released: 07/05/2009 Document Revised: 02/14/2016 Document Reviewed: 10/13/2014 Elsevier Interactive Patient Education  2017 [employer].

## 2018-02-04 ENCOUNTER — Encounter: Payer: Self-pay | Admitting: Family Medicine

## 2018-02-04 ENCOUNTER — Ambulatory Visit: Payer: Self-pay

## 2018-02-04 ENCOUNTER — Ambulatory Visit (INDEPENDENT_AMBULATORY_CARE_PROVIDER_SITE_OTHER): Payer: Medicare Other | Admitting: Family Medicine

## 2018-02-04 VITALS — BP 130/60 | HR 64 | Temp 97.7°F | Resp 16 | Ht 67.0 in | Wt 145.0 lb

## 2018-02-04 DIAGNOSIS — D649 Anemia, unspecified: Secondary | ICD-10-CM | POA: Diagnosis not present

## 2018-02-04 DIAGNOSIS — M8000XS Age-related osteoporosis with current pathological fracture, unspecified site, sequela: Secondary | ICD-10-CM

## 2018-02-04 DIAGNOSIS — E039 Hypothyroidism, unspecified: Secondary | ICD-10-CM

## 2018-02-04 DIAGNOSIS — I10 Essential (primary) hypertension: Secondary | ICD-10-CM | POA: Diagnosis not present

## 2018-02-04 NOTE — Patient Instructions (Addendum)
   The CDC recommends two doses of Shingrix (the shingles vaccine) separated by 2 to 6 months for adults age 82 years and older. I recommend checking with your pharmacy plan regarding coverage for this vaccine.    You can take OTC calcium citrate AND Vitamin D3 1,000 units every day.

## 2018-02-04 NOTE — Progress Notes (Signed)
Patient: Roger Cook Male    DOB: 07/01/1927   84 y.o.   MRN: [id number] Visit Date: 02/04/2018  Today's Provider: Lelon Huh, MD   Chief Complaint  Patient presents with  . Anemia    follow up  . Hypothyroidism    follow up  . Hypertension    follow up   Subjective:   Patient saw [facility] for AWV on 01/28/2018.   HPI   Hypertension, follow-up:  BP Readings from Last 3 Encounters:  01/28/18 (!) 142/50  11/25/17 (!) 120/50  11/19/17 (!) 182/72    He was last seen for hypertension 6 months ago.  BP at that visit was 128/58. Management since that visit includes; no changes.He reports good compliance with treatment. He is not having side effects.  He is exercising. He is adherent to low salt diet.   Outside blood pressures are not being checked. He is experiencing none.  Patient denies chest pain, chest pressure/discomfort, claudication, dyspnea, exertional chest pressure/discomfort, fatigue, irregular heart beat, lower extremity edema, near-syncope, orthopnea, palpitations, paroxysmal nocturnal dyspnea, syncope and tachypnea.   Cardiovascular risk factors include advanced age (older than 37 for men, 49 for women), hypertension and male gender.  Use of agents associated with hypertension: NSAIDS.   ------------------------------------------------------------------------  Acquired hypothyroidism From 08/06/2017-no changes. Well controlled. Continue current medications.  Patient reports good compliance with treatment.  Lab Results  Component Value Date   TSH 0.960 02/05/2017     Anemia, unspecified type From 08/06/2017-labs checked. Slightly anemic, but improved. No changes   He was also found to be osteopenic after incidental finding of thoracic vertebral compression fracture. He refused alendronate, but has been taking calcium citrate recommended by his pharmacist. He is not having any back pain.     Allergies  Allergen Reactions  . Levofloxacin      lips swelling  . Povidone Iodine   . Sulfa Antibiotics   . Benadryl [Diphenhydramine] Rash     Current Outpatient Medications:  .  ARTIFICIAL TEAR SOLUTION OP, Apply 1 drop to eye 2 (two) times daily. , Disp: , Rfl:  .  aspirin 81 MG tablet, Take 81 mg by mouth daily. , Disp: , Rfl:  .  brimonidine-timolol (COMBIGAN) 0.2-0.5 % ophthalmic solution, Place 1 drop into the left eye every 12 (twelve) hours. , Disp: , Rfl:  .  CALCIUM CITRATE PO, Take 1 tablet by mouth 2 (two) times daily., Disp: , Rfl:  .  fluticasone (FLONASE) 50 MCG/ACT nasal spray, PLACE 1-2 SPRAYS IN EACH NOSTRIL EVERY DAY AS NEEDED, Disp: 16 g, Rfl: 6 .  levothyroxine (SYNTHROID, LEVOTHROID) 88 MCG tablet, TAKE ONE TABLET BY MOUTH EVERY DAY, Disp: 90 tablet, Rfl: 4 .  loratadine (CLARITIN) 10 MG tablet, Take 10 mg by mouth daily., Disp: , Rfl:  .  magnesium hydroxide (MILK OF MAGNESIA) 400 MG/5ML suspension, Take by mouth daily as needed for mild constipation., Disp: , Rfl:  .  metoprolol succinate (TOPROL-XL) 25 MG 24 hr tablet, TAKE 1/2 TABLET BY MOUTH DAILY, REPLACES ATENOLOL, Disp: 45 tablet, Rfl: 5 .  MULTIPLE VITAMIN PO, Take by mouth daily. , Disp: , Rfl:  .  NIFEdipine (PROCARDIA XL/ADALAT-CC) 60 MG 24 hr tablet, TAKE ONE TABLET EVERY DAY, Disp: 90 tablet, Rfl: 4 .  NON FORMULARY, CPAP (Free Text) - Historical Medication  As directed  Started 22-Sep-1994 Active, Disp: , Rfl:  .  oxybutynin (DITROPAN-XL) 5 MG 24 hr tablet, Take 5 mg by mouth  at bedtime. , Disp: , Rfl:  .  polyethylene glycol (MIRALAX / GLYCOLAX) packet, Take 17 g by mouth daily as needed., Disp: , Rfl:  .  pravastatin (PRAVACHOL) 40 MG tablet, TAKE ONE TABLET EVERY DAY, Disp: 90 tablet, Rfl: 4 .  psyllium (METAMUCIL) 58.6 % packet, Take 1 packet by mouth daily. , Disp: , Rfl:  .  senna-docusate (SENOKOT-S) 8.6-50 MG tablet, Take 1 tablet at bedtime as needed by mouth. Take if no BM for more than two days, Disp: 1 tablet, Rfl: 0  Review of Systems   Constitutional: Negative for appetite change, chills and fever.  Respiratory: Negative for chest tightness, shortness of breath and wheezing.   Cardiovascular: Negative for chest pain and palpitations.  Gastrointestinal: Negative for abdominal pain, nausea and vomiting.    Social History   Tobacco Use  . Smoking status: Former Smoker    Types: Cigarettes  . Smokeless tobacco: Never Used  . Tobacco comment: quit in 1963  Substance Use Topics  . Alcohol use: Yes    Alcohol/week: 1.2 oz    Types: 2 Glasses of wine per week   Objective:   BP 130/60 (BP Location: Left Arm, Patient Position: Sitting, Cuff Size: Normal)   Pulse 64   Temp 97.7 F (36.5 C) (Oral)   Resp 16   Ht 5\' 7"  (1.702 m)   Wt 145 lb (65.8 kg)   SpO2 96% Comment: room air  BMI 22.71 kg/m  There were no vitals filed for this visit.   Physical Exam   General Appearance:    Alert, cooperative, no distress  Eyes:    PERRL, conjunctiva/corneas clear, EOM's intact       Lungs:     Clear to auscultation bilaterally, respirations unlabored  Heart:    Regular rate and rhythm  Neurologic:   Awake, alert, oriented x 3. No apparent focal neurological           defect.           Assessment & Plan:     1. Anemia, unspecified type  - CBC  2. Essential hypertension Well controlled.  Continue current medications.   - Renal function panel - EKG 12-Lead  3. Acquired hypothyroidism  - TSH  4. Osteoporosis with current pathological fracture, unspecified osteoporosis type, sequela incidental finding, refused alendronate but will to try OTC supplements. Is already on calcium and advised to add vitamin D3.        Lelon Huh, MD  [facility]

## 2018-02-05 ENCOUNTER — Telehealth: Payer: Self-pay | Admitting: *Deleted

## 2018-02-05 LAB — CBC
Hematocrit: 36.2 % — ABNORMAL LOW (ref 37.5–51.0)
Hemoglobin: 11.9 g/dL — ABNORMAL LOW (ref 13.0–17.7)
MCH: 30.7 pg (ref 26.6–33.0)
MCHC: 32.9 g/dL (ref 31.5–35.7)
MCV: 94 fL (ref 79–97)
PLATELETS: 247 10*3/uL (ref 150–379)
RBC: 3.87 x10E6/uL — ABNORMAL LOW (ref 4.14–5.80)
RDW: 14.3 % (ref 12.3–15.4)
WBC: 6.1 10*3/uL (ref 3.4–10.8)

## 2018-02-05 LAB — RENAL FUNCTION PANEL
Albumin: 4.5 g/dL (ref 3.5–4.7)
BUN / CREAT RATIO: 16 (ref 10–24)
BUN: 21 mg/dL (ref 8–27)
CO2: 24 mmol/L (ref 20–29)
Calcium: 9.9 mg/dL (ref 8.6–10.2)
Chloride: 102 mmol/L (ref 96–106)
Creatinine, Ser: 1.33 mg/dL — ABNORMAL HIGH (ref 0.76–1.27)
GFR, EST AFRICAN AMERICAN: 55 mL/min/{1.73_m2} — AB (ref >59)
GFR, EST NON AFRICAN AMERICAN: 47 mL/min/{1.73_m2} — AB (ref >59)
GLUCOSE: 87 mg/dL (ref 65–99)
POTASSIUM: 4.5 mmol/L (ref 3.5–5.2)
Phosphorus: 3.8 mg/dL (ref 2.5–4.5)
SODIUM: 141 mmol/L (ref 134–144)

## 2018-02-05 LAB — TSH: TSH: 1.49 u[IU]/mL (ref 0.450–4.500)

## 2018-02-05 NOTE — Telephone Encounter (Signed)
LMOVM for pt to return call 

## 2018-02-05 NOTE — Telephone Encounter (Signed)
-----   Message from [name], MD sent at 02/05/2018  7:16 AM EDT ----- Labs are good. Continue current medications.  Check labs yearly.

## 2018-02-10 NOTE — Telephone Encounter (Signed)
Left detailed message on pt's vm. Okay per dpr.  

## 2018-04-08 DIAGNOSIS — H35342 Macular cyst, hole, or pseudohole, left eye: Secondary | ICD-10-CM | POA: Diagnosis not present

## 2018-04-09 ENCOUNTER — Other Ambulatory Visit: Payer: Self-pay | Admitting: Family Medicine

## 2018-04-21 ENCOUNTER — Encounter: Payer: Self-pay | Admitting: Urology

## 2018-04-21 ENCOUNTER — Ambulatory Visit (INDEPENDENT_AMBULATORY_CARE_PROVIDER_SITE_OTHER): Payer: Medicare Other | Admitting: Urology

## 2018-04-21 VITALS — BP 178/71 | HR 57 | Ht 67.0 in | Wt 146.3 lb

## 2018-04-21 DIAGNOSIS — S37011D Minor contusion of right kidney, subsequent encounter: Secondary | ICD-10-CM

## 2018-04-21 DIAGNOSIS — Z8546 Personal history of malignant neoplasm of prostate: Secondary | ICD-10-CM

## 2018-04-21 NOTE — Progress Notes (Signed)
04/21/2018 12:32 PM   Roger Cook [birth year] [id number]  Referring provider: [name], [place], [place] [address]  Chief Complaint  Patient presents with  . Establish Care    HPI: 84 year old male presents to establish local urologic care.  He has previously seen Dr. Jacqlyn Larsen at [facility].  He last saw him in April 2019.  He was hospitalized at [facility] after injuring his right flank falling off a ladder.  He was found to have a right renal laceration and perinephric hematoma.  A follow-up renal ultrasound at his last visit showed a 6.5 cm cystic area in the inferior pole of the right kidney with internal septations.  It was felt this was most likely resolving hematoma.  A 65-month follow-up renal ultrasound was recommended.  Other past urologic history remarkable for prostate cancer status post radical prostatectomy in the 1980s.  His last PSA in 2017 was undetectable.  He also had a penile implant for post prostatectomy ED.  He has no bothersome lower urinary tract symptoms.  Denies dysuria or gross hematuria.  Denies flank/abdominal/pelvic pain.   PMH: Past Medical History:  Diagnosis Date  . Anemia   . Bell palsy   . Bladder tumor   . CAD (coronary artery disease)   . Cancer ([place])   . History of prostate cancer   . Hyperlipidemia   . Hypertension   . Lipoma   . MRSA bacteremia 02/2011  . Sleep apnea   . Thyroid disease    hypothyroidism    Surgical History: Past Surgical History:  Procedure Laterality Date  . APPENDECTOMY  2005  . EYE SURGERY  2005   cataract surgery, also had a macular hole in 2005  . EYE SURGERY  2008   catarac surgery  . HAND SURGERY Left 06/26/2011   Malignancy removed from left hand  . NASAL SEPTUM SURGERY  1986  . NASAL SINUS SURGERY  1990  . penile inplant  1984  . polyp of rectum  2003  . PROSTATE SURGERY  1975   Abdominal, had to have radiation treatment with the procedure  . PROSTATE SURGERY    .  TONSILLECTOMY      Home Medications:  Allergies as of 04/21/2018      Reactions   Levofloxacin    lips swelling   Povidone Iodine    Sulfa Antibiotics    Benadryl [diphenhydramine] Rash      Medication List        Accurate as of 04/21/18 12:32 PM. Always use your most recent med list.          ARTIFICIAL TEAR SOLUTION OP Apply 1 drop to eye 2 (two) times daily.   aspirin 81 MG tablet Take 81 mg by mouth daily.   CALCIUM CITRATE PO Take 1 tablet by mouth 2 (two) times daily.   COMBIGAN 0.2-0.5 % ophthalmic solution Generic drug:  brimonidine-timolol Place 1 drop into the left eye every 12 (twelve) hours.   fluticasone 50 MCG/ACT nasal spray Commonly known as:  FLONASE PLACE 1-2 SPRAYS IN EACH NOSTRIL EVERY DAY AS NEEDED   levothyroxine 88 MCG tablet Commonly known as:  SYNTHROID, LEVOTHROID TAKE ONE TABLET BY MOUTH EVERY DAY   loratadine 10 MG tablet Commonly known as:  CLARITIN Take 10 mg by mouth daily.   magnesium hydroxide 400 MG/5ML suspension Commonly known as:  MILK OF MAGNESIA Take by mouth daily as needed for mild constipation.   metoprolol succinate 25 MG  24 hr tablet Commonly known as:  TOPROL-XL TAKE 1/2 TABLET BY MOUTH DAILY  REPLACESATENOLOL   MULTIPLE VITAMIN PO Take by mouth daily.   NIFEdipine 60 MG 24 hr tablet Commonly known as:  PROCARDIA XL/ADALAT-CC TAKE ONE TABLET EVERY DAY   NON FORMULARY CPAP (Free Text) - Historical Medication  As directed  Started 22-Sep-1994 Active   polyethylene glycol packet Commonly known as:  MIRALAX / GLYCOLAX Take 17 g by mouth daily as needed.   pravastatin 40 MG tablet Commonly known as:  PRAVACHOL TAKE ONE TABLET EVERY DAY   psyllium 58.6 % packet Commonly known as:  METAMUCIL Take 1 packet by mouth daily.   senna-docusate 8.6-50 MG tablet Commonly known as:  Senokot-S Take 1 tablet at bedtime as needed by mouth. Take if no BM for more than two days       Allergies:  Allergies    Allergen Reactions  . Levofloxacin     lips swelling  . Povidone Iodine   . Sulfa Antibiotics   . Benadryl [Diphenhydramine] Rash    Family History: Family History  Problem Relation Age of Onset  . Lung cancer Mother   . Heart disease Father     Social History:  reports that he has quit smoking. His smoking use included cigarettes. He has never used smokeless tobacco. He reports that he drinks about 1.2 oz of alcohol per week. He reports that he does not use drugs.  ROS: UROLOGY Frequent Urination?: No Hard to postpone urination?: No Burning/pain with urination?: No Get up at night to urinate?: Yes Leakage of urine?: Yes Urine stream starts and stops?: No Trouble starting stream?: No Do you have to strain to urinate?: No Blood in urine?: No Urinary tract infection?: No Sexually transmitted disease?: No Injury to kidneys or bladder?: No Painful intercourse?: No Weak stream?: No Erection problems?: No Penile pain?: No  Gastrointestinal Nausea?: No Vomiting?: No Indigestion/heartburn?: No Diarrhea?: No Constipation?: No  Constitutional Fever: No Night sweats?: No Weight loss?: No Fatigue?: No  Skin Skin rash/lesions?: No Itching?: No  Eyes Blurred vision?: No Double vision?: No  Ears/Nose/Throat Sore throat?: No Sinus problems?: No  Hematologic/Lymphatic Swollen glands?: No Easy bruising?: No  Cardiovascular Leg swelling?: No Chest pain?: No  Respiratory Cough?: No Shortness of breath?: No  Endocrine Excessive thirst?: No  Musculoskeletal Back pain?: No Joint pain?: No  Neurological Headaches?: No Dizziness?: No  Psychologic Depression?: No Anxiety?: No  Physical Exam: BP (!) 178/71 (BP Location: Left Arm, Patient Position: Sitting, Cuff Size: Normal)   Pulse (!) 57   Ht 5\' 7"  (1.702 m)   Wt 146 lb 4.8 oz (66.4 kg)   BMI 22.91 kg/m   Constitutional:  Alert and oriented, No acute distress. HEENT: [place] AT, moist mucus  membranes.  Trachea midline, no masses. Cardiovascular: No clubbing, cyanosis, or edema. Respiratory: Normal respiratory effort, no increased work of breathing. GI: Abdomen is soft, nontender, nondistended, no abdominal masses GU: No CVA tenderness Lymph: No cervical or inguinal lymphadenopathy. Skin: No rashes, bruises or suspicious lesions. Neurologic: Grossly intact, no focal deficits, moving all 4 extremities. Psychiatric: Normal mood and affect.  Laboratory Data: Lab Results  Component Value Date   WBC 6.1 02/04/2018   HGB 11.9 (L) 02/04/2018   HCT 36.2 (L) 02/04/2018   MCV 94 02/04/2018   PLT 247 02/04/2018    Lab Results  Component Value Date   CREATININE 1.33 (H) 02/04/2018    Lab Results  Component Value Date  PSA 0.1 08/30/2014     Assessment & Plan:   84 year old male with history of renal laceration/perinephric hematoma.  Will schedule a follow-up renal ultrasound and he will be notified with results and follow-up recommendations.  He is greater than 20 years out from radical prostatectomy with an undetectable PSA.   Abbie Sons, [place] [address], [place], [place] [address] [phone]

## 2018-04-25 ENCOUNTER — Encounter: Payer: Self-pay | Admitting: Urology

## 2018-04-25 DIAGNOSIS — S37011D Minor contusion of right kidney, subsequent encounter: Secondary | ICD-10-CM | POA: Insufficient documentation

## 2018-04-26 ENCOUNTER — Other Ambulatory Visit: Payer: Self-pay | Admitting: Family Medicine

## 2018-04-27 ENCOUNTER — Telehealth: Payer: Self-pay | Admitting: Urology

## 2018-04-27 NOTE — Telephone Encounter (Signed)
Pt called office to follow up on RUS, states he has not heard from anyone to get that scheduled, Please enter RUS orders. Thank you =)

## 2018-06-23 ENCOUNTER — Telehealth: Payer: Self-pay | Admitting: Urology

## 2018-06-23 NOTE — Telephone Encounter (Signed)
Pt called office asking about "scans" that Dr. Bernardo Heater mentioned he wanted to have done. Pt hasn't heard from anyone about scheduling any "scans" Do you want this pt to have any imaging done, if so, please advise and enter orders. Thank you.

## 2018-06-24 ENCOUNTER — Other Ambulatory Visit: Payer: Self-pay

## 2018-06-24 DIAGNOSIS — S37011D Minor contusion of right kidney, subsequent encounter: Secondary | ICD-10-CM

## 2018-06-25 NOTE — Telephone Encounter (Signed)
Renal ultrasound order was entered.  Will call with results.

## 2018-07-09 ENCOUNTER — Ambulatory Visit: Payer: Medicare Other

## 2018-07-12 ENCOUNTER — Ambulatory Visit
Admission: RE | Admit: 2018-07-12 | Discharge: 2018-07-12 | Disposition: A | Discharge status: Home / Self Care | Payer: Medicare Other | Source: Ambulatory Visit | Attending: Urology | Admitting: Urology

## 2018-07-12 ENCOUNTER — Ambulatory Visit (INDEPENDENT_AMBULATORY_CARE_PROVIDER_SITE_OTHER): Payer: Medicare Other | Admitting: Family Medicine

## 2018-07-12 ENCOUNTER — Ambulatory Visit (HOSPITAL_COMMUNITY): Payer: Medicare Other

## 2018-07-12 DIAGNOSIS — S37011D Minor contusion of right kidney, subsequent encounter: Secondary | ICD-10-CM | POA: Diagnosis not present

## 2018-07-12 DIAGNOSIS — N281 Cyst of kidney, acquired: Secondary | ICD-10-CM | POA: Diagnosis not present

## 2018-07-12 DIAGNOSIS — Z23 Encounter for immunization: Secondary | ICD-10-CM | POA: Diagnosis not present

## 2018-07-12 NOTE — Progress Notes (Signed)
flu

## 2018-07-14 ENCOUNTER — Ambulatory Visit: Payer: Medicare Other

## 2018-09-13 ENCOUNTER — Ambulatory Visit (INDEPENDENT_AMBULATORY_CARE_PROVIDER_SITE_OTHER): Payer: Medicare Other | Admitting: Family Medicine

## 2018-09-13 ENCOUNTER — Encounter: Payer: Self-pay | Admitting: Family Medicine

## 2018-09-13 VITALS — BP 138/60 | HR 72 | Temp 97.9°F | Resp 16 | Wt 150.0 lb

## 2018-09-13 DIAGNOSIS — J069 Acute upper respiratory infection, unspecified: Secondary | ICD-10-CM

## 2018-09-13 MED ORDER — AZITHROMYCIN 250 MG PO TABS: ORAL_TABLET | ORAL | 0 refills | Status: AC

## 2018-09-13 NOTE — Progress Notes (Signed)
Patient: Roger Cook Male    DOB: [birth year]   84 y.o.   MRN: [id number] Visit Date: 09/13/2018  Today's Provider: Lelon Huh, MD   Chief Complaint  Patient presents with  . URI    Started about six days ago.   Subjective:     URI   This is a new problem. The current episode started in the past 7 days. The problem has been gradually improving. Associated symptoms include congestion and sneezing. Pertinent negatives include no abdominal pain, diarrhea, nausea, plugged ear sensation, sinus pain or vomiting. He has tried decongestant (OTC Cough medicine) for the symptoms. The treatment provided mild relief.    Allergies  Allergen Reactions  . Levofloxacin     lips swelling  . Povidone Iodine   . Sulfa Antibiotics   . Benadryl [Diphenhydramine] Rash     Current Outpatient Medications:  .  ARTIFICIAL TEAR SOLUTION OP, Apply 1 drop to eye 2 (two) times daily. , Disp: , Rfl:  .  aspirin 81 MG tablet, Take 81 mg by mouth daily. , Disp: , Rfl:  .  brimonidine-timolol (COMBIGAN) 0.2-0.5 % ophthalmic solution, Place 1 drop into the left eye every 12 (twelve) hours. , Disp: , Rfl:  .  CALCIUM CITRATE PO, Take 1 tablet by mouth 2 (two) times daily., Disp: , Rfl:  .  fluticasone (FLONASE) 50 MCG/ACT nasal spray, PLACE 1-2 SPRAYS IN EACH NOSTRIL EVERY DAY AS NEEDED, Disp: 16 g, Rfl: 6 .  levothyroxine (SYNTHROID, LEVOTHROID) 88 MCG tablet, TAKE ONE TABLET BY MOUTH EVERY DAY, Disp: 90 tablet, Rfl: 4 .  metoprolol succinate (TOPROL-XL) 25 MG 24 hr tablet, TAKE 1/2 TABLET BY MOUTH DAILY  REPLACESATENOLOL, Disp: 45 tablet, Rfl: 6 .  MULTIPLE VITAMIN PO, Take by mouth daily. , Disp: , Rfl:  .  NIFEdipine (PROCARDIA XL/ADALAT-CC) 60 MG 24 hr tablet, TAKE ONE TABLET EVERY DAY, Disp: 90 tablet, Rfl: 4 .  NON FORMULARY, CPAP (Free Text) - Historical Medication  As directed  Started 22-Sep-1994 Active, Disp: , Rfl:  .  loratadine (CLARITIN) 10 MG tablet, Take 10 mg by mouth daily., Disp:  , Rfl:  .  magnesium hydroxide (MILK OF MAGNESIA) 400 MG/5ML suspension, Take by mouth daily as needed for mild constipation., Disp: , Rfl:  .  polyethylene glycol (MIRALAX / GLYCOLAX) packet, Take 17 g by mouth daily as needed., Disp: , Rfl:  .  pravastatin (PRAVACHOL) 40 MG tablet, TAKE ONE TABLET EVERY DAY (Patient not taking: Reported on 09/13/2018), Disp: 90 tablet, Rfl: 4 .  psyllium (METAMUCIL) 58.6 % packet, Take 1 packet by mouth daily. , Disp: , Rfl:  .  senna-docusate (SENOKOT-S) 8.6-50 MG tablet, Take 1 tablet at bedtime as needed by mouth. Take if no BM for more than two days (Patient not taking: Reported on 09/13/2018), Disp: 1 tablet, Rfl: 0  Review of Systems  Constitutional: Negative for activity change, appetite change, diaphoresis, fatigue and unexpected weight change.  HENT: Positive for congestion and sneezing. Negative for ear discharge, sinus pressure, sinus pain, tinnitus and trouble swallowing.   Eyes: Negative.   Respiratory: Negative for apnea, choking, chest tightness and stridor.   Gastrointestinal: Negative.  Negative for abdominal pain, diarrhea, nausea and vomiting.  Neurological: Negative for dizziness and light-headedness.    Social History   Tobacco Use  . Smoking status: Former Smoker    Types: Cigarettes  . Smokeless tobacco: Never Used  . Tobacco comment: quit in 1963  Substance Use Topics  . Alcohol use: Yes    Alcohol/week: 2.0 standard drinks    Types: 2 Glasses of wine per week      Objective:   BP 138/60 (BP Location: Right Arm, Patient Position: Sitting, Cuff Size: Normal)   Pulse 72   Temp 97.9 F (36.6 C) (Oral)   Resp 16   Wt 150 lb (68 kg)   BMI 23.49 kg/m  Vitals:   09/13/18 1527  BP: 138/60  Pulse: 72  Resp: 16  Temp: 97.9 F (36.6 C)  TempSrc: Oral  Weight: 150 lb (68 kg)     Physical Exam  General Appearance:    Alert, cooperative, no distress  HENT:   both TM normal without fluid or infection, neck without  nodes, throat normal without erythema or exudate and nasal mucosa pale and congested  Eyes:    PERRL, conjunctiva/corneas clear, EOM's intact       Lungs:     Clear to auscultation bilaterally, respirations unlabored  Heart:    Regular rate and rhythm  Neurologic:   Awake, alert, oriented x 3. No apparent focal neurological           defect.          Assessment & Plan    1. Upper respiratory tract infection, unspecified type Counseled regarding signs and symptoms of viral and bacterial respiratory infections. Advised him that he can start prescription for antibiotic if he develops any sign of bacterial infection, or if current symptoms last longer than 10 days.   - azithromycin (ZITHROMAX) 250 MG tablet; 2 by mouth today, then 1 daily for 4 days  Dispense: 6 tablet; Refill: 0     Lelon Huh, MD  [facility]

## 2018-09-13 NOTE — Patient Instructions (Signed)
.   Please bring all of your medications to every appointment so we can make sure that our medication list is the same as yours.   

## 2018-09-25 ENCOUNTER — Other Ambulatory Visit: Payer: Self-pay | Admitting: Family Medicine

## 2018-10-13 DIAGNOSIS — M205X1 Other deformities of toe(s) (acquired), right foot: Secondary | ICD-10-CM | POA: Diagnosis not present

## 2018-10-13 DIAGNOSIS — M79675 Pain in left toe(s): Secondary | ICD-10-CM | POA: Diagnosis not present

## 2018-10-13 DIAGNOSIS — M79674 Pain in right toe(s): Secondary | ICD-10-CM | POA: Diagnosis not present

## 2018-10-13 DIAGNOSIS — M205X2 Other deformities of toe(s) (acquired), left foot: Secondary | ICD-10-CM | POA: Diagnosis not present

## 2018-10-13 DIAGNOSIS — B351 Tinea unguium: Secondary | ICD-10-CM | POA: Diagnosis not present

## 2018-11-04 DIAGNOSIS — H35342 Macular cyst, hole, or pseudohole, left eye: Secondary | ICD-10-CM | POA: Diagnosis not present

## 2019-01-05 ENCOUNTER — Other Ambulatory Visit: Payer: Self-pay | Admitting: Family Medicine

## 2019-01-20 DIAGNOSIS — H903 Sensorineural hearing loss, bilateral: Secondary | ICD-10-CM | POA: Diagnosis not present

## 2019-01-20 DIAGNOSIS — H6123 Impacted cerumen, bilateral: Secondary | ICD-10-CM | POA: Diagnosis not present

## 2019-02-01 ENCOUNTER — Other Ambulatory Visit: Payer: Self-pay | Admitting: Family Medicine

## 2019-02-02 ENCOUNTER — Ambulatory Visit (INDEPENDENT_AMBULATORY_CARE_PROVIDER_SITE_OTHER): Payer: Medicare Other

## 2019-02-02 DIAGNOSIS — Z0000 Encounter for general adult medical examination without abnormal findings: Secondary | ICD-10-CM

## 2019-02-02 NOTE — Progress Notes (Signed)
Subjective:   Roger Cook is a 85 y.o. male who presents for Medicare Annual/Subsequent preventive examination.    This visit is being conducted through telemedicine due to the COVID-19 pandemic. This patient has given me verbal consent via doximity to conduct this visit, patient states they are participating from their home address. Some vital signs may be absent or patient reported.    Patient identification: identified by name, DOB, and current address  Review of Systems:  N/A  Cardiac Risk Factors include: advanced age (>46men, >37 women);hypertension;male gender     Objective:    Vitals: There were no vitals taken for this visit.  There is no height or weight on file to calculate BMI. Unable to obtain vitals due to visit being conducted via telephonically.   Advanced Directives 02/02/2019 01/28/2018 11/19/2017 11/06/2016 09/28/2016 08/10/2016  Does Patient Have a Medical Advance Directive? Yes Yes Yes Yes Yes No  Type of Paramedic of [place];Living will [place];Living will [place];Living will [place];Living will Living will -  Copy of [place] in Chart? No - copy requested No - copy requested - No - copy requested - -  Would patient like information on creating a medical advance directive? - - - - - No - patient declined information    Tobacco Social History   Tobacco Use  Smoking Status Former Smoker  . Types: Cigarettes  Smokeless Tobacco Never Used  Tobacco Comment   quit in 1963     Counseling given: Not Answered Comment: quit in 1963   Clinical Intake:  Pre-visit preparation completed: Yes  Pain : No/denies pain Pain Score: 0-No pain     Nutritional Status: BMI of 19-24  Normal Nutritional Risks: None Diabetes: No  How often do you need to have someone help you when you read instructions, pamphlets, or other written materials from your doctor or  pharmacy?: 1 - Never  Interpreter Needed?: No  Information entered by :: [facility], LPN  Past Medical History:  Diagnosis Date  . Anemia   . Bell palsy   . Bladder tumor   . CAD (coronary artery disease)   . Cancer ([place])   . History of prostate cancer   . Hyperlipidemia   . Hypertension   . Lipoma   . MRSA bacteremia 02/2011  . Sleep apnea   . Thyroid disease    hypothyroidism   Past Surgical History:  Procedure Laterality Date  . APPENDECTOMY  2005  . EYE SURGERY  2005   cataract surgery, also had a macular hole in 2005  . EYE SURGERY  2008   catarac surgery  . HAND SURGERY Left 06/26/2011   Malignancy removed from left hand  . NASAL SEPTUM SURGERY  1986  . NASAL SINUS SURGERY  1990  . penile inplant  1984  . polyp of rectum  2003  . PROSTATE SURGERY  1975   Abdominal, had to have radiation treatment with the procedure  . PROSTATE SURGERY    . TONSILLECTOMY     Family History  Problem Relation Age of Onset  . Lung cancer Mother   . Heart disease Father    Social History   Socioeconomic History  . Marital status: Married    Spouse name: Not on file  . Number of children: 2  . Years of education: Not on file  . Highest education level: Bachelor's degree (e.g., BA, AB, BS)  Occupational History  .  Occupation: retired  Scientific laboratory technician  . Financial resource strain: Not hard at all  . Food insecurity:    Worry: Never true    Inability: Never true  . Transportation needs:    Medical: No    Non-medical: No  Tobacco Use  . Smoking status: Former Smoker    Types: Cigarettes  . Smokeless tobacco: Never Used  . Tobacco comment: quit in 1963  Substance and Sexual Activity  . Alcohol use: Yes    Alcohol/week: 2.0 standard drinks    Types: 2 Glasses of wine per week  . Drug use: No  . Sexual activity: Yes  Lifestyle  . Physical activity:    Days per week: 0 days    Minutes per session: 0 min  . Stress: Not at all  Relationships  . Social connections:     Talks on phone: Patient refused    Gets together: Patient refused    Attends religious service: Patient refused    Active member of club or organization: Patient refused    Attends meetings of clubs or organizations: Patient refused    Relationship status: Patient refused  Other Topics Concern  . Not on file  Social History Narrative  . Not on file    Outpatient Encounter Medications as of 02/02/2019  Medication Sig  . ARTIFICIAL TEAR SOLUTION OP Apply 1 drop to eye 2 (two) times daily.   Marland Kitchen aspirin 81 MG tablet Take 81 mg by mouth daily.   Marland Kitchen CALCIUM CITRATE PO Take 1 tablet by mouth 2 (two) times daily.  . fluticasone (FLONASE) 50 MCG/ACT nasal spray PLACE 1-2 SPRAYS IN EACH NOSTRIL EVERY DAY AS NEEDED  . levothyroxine (SYNTHROID, LEVOTHROID) 88 MCG tablet TAKE ONE TABLET BY MOUTH EVERY DAY  . loratadine (CLARITIN) 10 MG tablet Take 10 mg by mouth daily.  . metoprolol succinate (TOPROL-XL) 25 MG 24 hr tablet TAKE 1/2 TABLET BY MOUTH DAILY  REPLACESATENOLOL  . MULTIPLE VITAMIN PO Take by mouth daily.   Marland Kitchen NIFEdipine (PROCARDIA XL/NIFEDICAL XL) 60 MG 24 hr tablet TAKE 1 TABLET BY MOUTH DAILY  . NON FORMULARY CPAP (Free Text) - Historical Medication  As directed  Started 22-Sep-1994 Active  . polyethylene glycol (MIRALAX / GLYCOLAX) packet Take 17 g by mouth daily as needed.  . pravastatin (PRAVACHOL) 40 MG tablet TAKE ONE TABLET EVERY DAY  . psyllium (METAMUCIL) 58.6 % packet Take 1 packet by mouth daily.   . sodium fluoride (PREVIDENT) 1.1 % GEL dental gel Place 1 application onto teeth at bedtime. Unsure of dose  . timolol (TIMOPTIC) 0.5 % ophthalmic solution Place 1 drop into the left eye 2 (two) times daily.   . brimonidine-timolol (COMBIGAN) 0.2-0.5 % ophthalmic solution Place 1 drop into the left eye every 12 (twelve) hours.   . magnesium hydroxide (MILK OF MAGNESIA) 400 MG/5ML suspension Take by mouth daily as needed for mild constipation.  . senna-docusate (SENOKOT-S) 8.6-50 MG  tablet Take 1 tablet at bedtime as needed by mouth. Take if no BM for more than two days (Patient not taking: Reported on 09/13/2018)   No facility-administered encounter medications on file as of 02/02/2019.     Activities of Daily Living In your present state of health, do you have any difficulty performing the following activities: 02/02/2019  Hearing? N  Vision? N  Comment Wears eye glasses daily.   Difficulty concentrating or making decisions? N  Walking or climbing stairs? N  Dressing or bathing? N  Doing errands, shopping? N  Preparing Food and eating ? N  Using the Toilet? N  In the past six months, have you accidently leaked urine? Y  Comment Occasionally due to hx of prostate removal.   Do you have problems with loss of bowel control? N  Managing your Medications? N  Managing your Finances? N  Housekeeping or managing your Housekeeping? N  Some recent data might be hidden    Patient Care Team: [name], MD as PCP - General (Family Medicine) Birder Robson, MD as Referring Physician (Ophthalmology) Abbie Sons, MD (Urology)   Assessment:   This is a routine wellness examination for [place].  Exercise Activities and Dietary recommendations Current Exercise Habits: Home exercise routine, Type of exercise: walking, Time (Minutes): > 60, Frequency (Times/Week): 6, Weekly Exercise (Minutes/Week): 0, Intensity: Mild, Exercise limited by: None identified  Goals    . DIET - INCREASE WATER INTAKE     Recommend increasing water intake to 6 glasses a day.     . Increase water intake     Starting 11/06/16, I will increase my water intake to 4-6 glasses a day.       Fall Risk: Fall Risk  02/02/2019 01/28/2018 11/06/2016 11/06/2015  Falls in the past year? 0 Yes No No  Number falls in past yr: - 2 or more - -  Injury with Fall? - Yes - -  Comment - kidney injury (?) - -  Follow up - Falls prevention discussed - -    FALL RISK PREVENTION PERTAINING TO THE HOME:   Any stairs in or around the home? Yes  If so, are there any without handrails? Yes   Home free of loose throw rugs in walkways, pet beds, electrical cords, etc? Yes  Adequate lighting in your home to reduce risk of falls? Yes   ASSISTIVE DEVICES UTILIZED TO PREVENT FALLS:  Life alert? No  Use of a cane, walker or w/c? No  Grab bars in the bathroom? No  Shower chair or bench in shower? No  Elevated toilet seat or a handicapped toilet? No   TIMED UP AND GO:  Was the test performed? No .    Depression Screen PHQ 2/9 Scores 02/02/2019 01/28/2018 11/06/2016 11/06/2015  PHQ - 2 Score 0 0 0 0    Cognitive Function: Declined today.      6CIT Screen 11/06/2016  What Year? 0 points  What month? 0 points  What time? 0 points  Count back from 20 0 points  Months in reverse 0 points  Repeat phrase 6 points  Total Score 6    Immunization History  Administered Date(s) Administered  . Influenza Split 06/19/2009, 06/11/2010, 05/09/2011, 07/01/2012  . Influenza, High Dose Seasonal PF 06/22/2014, 07/04/2015, 06/05/2016, 06/18/2017, 07/12/2018  . Influenza,inj,Quad PF,6+ Mos 05/28/2013  . Pneumococcal Conjugate-13 03/10/2014  . Pneumococcal Polysaccharide-23 09/22/1996  . Tdap 09/09/2002, 08/10/2013, 03/06/2015  . Zoster 07/16/2006  . Zoster Recombinat (Shingrix) 04/02/2018, 08/04/2018    Qualifies for Shingles Vaccine? Up to date  Tdap: Up to date  Flu Vaccine: Up to date  Pneumococcal Vaccine: Up to date   Screening Tests Health Maintenance  Topic Date Due  . INFLUENZA VACCINE  04/23/2019  . TETANUS/TDAP  03/05/2025  . PNA vac Low Risk Adult  Completed   Cancer Screenings:  Colorectal Screening: No longer required.   Lung Cancer Screening: (Low Dose CT Chest recommended if Age 59-80 years, 30 pack-year currently smoking OR have quit w/in 15years.) does not qualify.   Additional  Screening:  Vision Screening: Recommended annual ophthalmology exams for early detection  of glaucoma and other disorders of the eye.  Dental Screening: Recommended annual dental exams for proper oral hygiene  Community Resource Referral:  CRR required this visit?  No        Plan:  I have personally reviewed and addressed the Medicare Annual Wellness questionnaire and have noted the following in the patient's chart:  A. Medical and social history B. Use of alcohol, tobacco or illicit drugs  C. Current medications and supplements D. Functional ability and status E.  Nutritional status F.  Physical activity G. Advance directives H. List of other physicians I.  Hospitalizations, surgeries, and ER visits in previous 12 months J.  [place] such as hearing and vision if needed, cognitive and depression L. Referrals and appointments   In addition, I have reviewed and discussed with patient certain preventive protocols, quality metrics, and best practice recommendations. A written personalized care plan for preventive services as well as general preventive health recommendations were provided to patient.   [place] Score, [place]  10/26/5595 Nurse Health Advisor   Nurse Notes: None.

## 2019-02-02 NOTE — Patient Instructions (Addendum)
Roger Cook , Thank you for taking time to come for your Medicare Wellness Visit. I appreciate your ongoing commitment to your health goals. Please review the following plan we discussed and let me know if I can assist you in the future.   Screening recommendations/referrals: Colonoscopy: No longer required.  Recommended yearly ophthalmology/optometry visit for glaucoma screening and checkup Recommended yearly dental visit for hygiene and checkup  Vaccinations: Influenza vaccine: Up to date Pneumococcal vaccine: Completed series Tdap vaccine: Up to date, due 02/2025 Shingles vaccine: Completed series    Advanced directives: Please bring a copy of your POA (Power of Attorney) and/or Living Will to your next appointment.   Conditions/risks identified: Continue to increase water intake to 6-8 8 oz glasses a day.   Next appointment: 05/04/19 @ 10 AM with Dr Caryn Section. Declined scheduling an AWV for 2021.  Preventive Care 29 Years and Older, Male Preventive care refers to lifestyle choices and visits with your health care provider that can promote health and wellness. What does preventive care include?  A yearly physical exam. This is also called an annual well check.  Dental exams once or twice a year.  Routine eye exams. Ask your health care provider how often you should have your eyes checked.  Personal lifestyle choices, including:  Daily care of your teeth and gums.  Regular physical activity.  Eating a healthy diet.  Avoiding tobacco and drug use.  Limiting alcohol use.  Practicing safe sex.  Taking low doses of aspirin every day.  Taking vitamin and mineral supplements as recommended by your health care provider. What happens during an annual well check? The services and screenings done by your health care provider during your annual well check will depend on your age, overall health, lifestyle risk factors, and family history of disease. Counseling  Your health care  provider may ask you questions about your:  Alcohol use.  Tobacco use.  Drug use.  Emotional well-being.  Home and relationship well-being.  Sexual activity.  Eating habits.  History of falls.  Memory and ability to understand (cognition).  Work and work Statistician. Screening  You may have the following tests or measurements:  Height, weight, and BMI.  Blood pressure.  Lipid and cholesterol levels. These may be checked every 5 years, or more frequently if you are over 75 years old.  Skin check.  Lung cancer screening. You may have this screening every year starting at age 74 if you have a 30-pack-year history of smoking and currently smoke or have quit within the past 15 years.  Fecal occult blood test (FOBT) of the stool. You may have this test every year starting at age 15.  Flexible sigmoidoscopy or colonoscopy. You may have a sigmoidoscopy every 5 years or a colonoscopy every 10 years starting at age 66.  Prostate cancer screening. Recommendations will vary depending on your family history and other risks.  Hepatitis C blood test.  Hepatitis B blood test.  Sexually transmitted disease (STD) testing.  Diabetes screening. This is done by checking your blood sugar (glucose) after you have not eaten for a while (fasting). You may have this done every 1-3 years.  Abdominal aortic aneurysm (AAA) screening. You may need this if you are a current or former smoker.  Osteoporosis. You may be screened starting at age 33 if you are at high risk. Talk with your health care provider about your test results, treatment options, and if necessary, the need for more tests. Vaccines  Your health  care provider may recommend certain vaccines, such as:  Influenza vaccine. This is recommended every year.  Tetanus, diphtheria, and acellular pertussis (Tdap, Td) vaccine. You may need a Td booster every 10 years.  Zoster vaccine. You may need this after age 66.  Pneumococcal  13-valent conjugate (PCV13) vaccine. One dose is recommended after age 28.  Pneumococcal polysaccharide (PPSV23) vaccine. One dose is recommended after age 60. Talk to your health care provider about which screenings and vaccines you need and how often you need them. This information is not intended to replace advice given to you by your health care provider. Make sure you discuss any questions you have with your health care provider. Document Released: 10/05/2015 Document Revised: 05/28/2016 Document Reviewed: 07/10/2015 Elsevier Interactive Patient Education  2017 [place] [employer] Prevention in the Home Falls can cause injuries. They can happen to people of all ages. There are many things you can do to make your home safe and to help prevent falls. What can I do on the outside of my home?  Regularly fix the edges of walkways and driveways and fix any cracks.  Remove anything that might make you trip as you walk through a door, such as a raised step or threshold.  Trim any bushes or trees on the path to your home.  Use bright outdoor lighting.  Clear any walking paths of anything that might make someone trip, such as rocks or tools.  Regularly check to see if handrails are loose or broken. Make sure that both sides of any steps have handrails.  Any raised decks and porches should have guardrails on the edges.  Have any leaves, snow, or ice cleared regularly.  Use sand or salt on walking paths during winter.  Clean up any spills in your garage right away. This includes oil or grease spills. What can I do in the bathroom?  Use night lights.  Install grab bars by the toilet and in the tub and shower. Do not use towel bars as grab bars.  Use non-skid mats or decals in the tub or shower.  If you need to sit down in the shower, use a plastic, non-slip stool.  Keep the floor dry. Clean up any water that spills on the floor as soon as it happens.  Remove soap buildup in the  tub or shower regularly.  Attach bath mats securely with double-sided non-slip rug tape.  Do not have throw rugs and other things on the floor that can make you trip. What can I do in the bedroom?  Use night lights.  Make sure that you have a light by your bed that is easy to reach.  Do not use any sheets or blankets that are too big for your bed. They should not hang down onto the floor.  Have a firm chair that has side arms. You can use this for support while you get dressed.  Do not have throw rugs and other things on the floor that can make you trip. What can I do in the kitchen?  Clean up any spills right away.  Avoid walking on wet floors.  Keep items that you use a lot in easy-to-reach places.  If you need to reach something above you, use a strong step stool that has a grab bar.  Keep electrical cords out of the way.  Do not use floor polish or wax that makes floors slippery. If you must use wax, use non-skid floor wax.  Do not have  throw rugs and other things on the floor that can make you trip. What can I do with my stairs?  Do not leave any items on the stairs.  Make sure that there are handrails on both sides of the stairs and use them. Fix handrails that are broken or loose. Make sure that handrails are as long as the stairways.  Check any carpeting to make sure that it is firmly attached to the stairs. Fix any carpet that is loose or worn.  Avoid having throw rugs at the top or bottom of the stairs. If you do have throw rugs, attach them to the floor with carpet tape.  Make sure that you have a light switch at the top of the stairs and the bottom of the stairs. If you do not have them, ask someone to add them for you. What else can I do to help prevent falls?  Wear shoes that:  Do not have high heels.  Have rubber bottoms.  Are comfortable and fit you well.  Are closed at the toe. Do not wear sandals.  If you use a stepladder:  Make sure that it  is fully opened. Do not climb a closed stepladder.  Make sure that both sides of the stepladder are locked into place.  Ask someone to hold it for you, if possible.  Clearly mark and make sure that you can see:  Any grab bars or handrails.  First and last steps.  Where the edge of each step is.  Use tools that help you move around (mobility aids) if they are needed. These include:  Canes.  Walkers.  Scooters.  Crutches.  Turn on the lights when you go into a dark area. Replace any light bulbs as soon as they burn out.  Set up your furniture so you have a clear path. Avoid moving your furniture around.  If any of your floors are uneven, fix them.  If there are any pets around you, be aware of where they are.  Review your medicines with your doctor. Some medicines can make you feel dizzy. This can increase your chance of falling. Ask your doctor what other things that you can do to help prevent falls. This information is not intended to replace advice given to you by your health care provider. Make sure you discuss any questions you have with your health care provider. Document Released: 07/05/2009 Document Revised: 02/14/2016 Document Reviewed: 10/13/2014 Elsevier Interactive Patient Education  2017 [employer].

## 2019-02-03 ENCOUNTER — Ambulatory Visit: Payer: Self-pay

## 2019-03-22 ENCOUNTER — Telehealth: Payer: Self-pay | Admitting: Family Medicine

## 2019-03-22 DIAGNOSIS — G4733 Obstructive sleep apnea (adult) (pediatric): Secondary | ICD-10-CM

## 2019-03-22 NOTE — Telephone Encounter (Signed)
Tiffany from [facility] called stating that the patient is requesting a new CPAP machine. Pt's last sleep study was done in 2015. Please call patient and advise if he needs to have another sleep study done.   Thank you, [facility]

## 2019-03-23 NOTE — Telephone Encounter (Signed)
Ii don't know, it depends on his insurance. We do not have a copy of his original sleep study. Let them know at [facility] that we do not have a copy of the original sleep study from 2015. If his insurance require a new CPAP study in order to get a machine, then we can order one.

## 2019-03-29 NOTE — Addendum Note (Signed)
Addended by: [name] on: 03/29/2019 02:39 PM   Modules accepted: Orders

## 2019-03-29 NOTE — Telephone Encounter (Signed)
I spoke with Tiffany.  She states pt will need a new sleep study.    Thanks,   -Mickel Baas

## 2019-04-04 ENCOUNTER — Other Ambulatory Visit: Payer: Self-pay | Admitting: Family Medicine

## 2019-04-05 NOTE — Telephone Encounter (Signed)
For insurance to cover sleep study pt will need visit to discuss

## 2019-04-05 NOTE — Telephone Encounter (Signed)
Please review

## 2019-04-05 NOTE — Telephone Encounter (Signed)
Patient states that he has an appointment in September to discuss sleep study.

## 2019-05-02 ENCOUNTER — Other Ambulatory Visit: Payer: Self-pay | Admitting: Family Medicine

## 2019-05-04 ENCOUNTER — Encounter: Payer: Medicare Other | Admitting: Family Medicine

## 2019-05-17 DIAGNOSIS — M79671 Pain in right foot: Secondary | ICD-10-CM | POA: Diagnosis not present

## 2019-05-17 DIAGNOSIS — D2371 Other benign neoplasm of skin of right lower limb, including hip: Secondary | ICD-10-CM | POA: Diagnosis not present

## 2019-05-17 DIAGNOSIS — B351 Tinea unguium: Secondary | ICD-10-CM | POA: Diagnosis not present

## 2019-05-17 DIAGNOSIS — M79675 Pain in left toe(s): Secondary | ICD-10-CM | POA: Diagnosis not present

## 2019-05-19 DIAGNOSIS — H40052 Ocular hypertension, left eye: Secondary | ICD-10-CM | POA: Diagnosis not present

## 2019-05-23 DIAGNOSIS — H6123 Impacted cerumen, bilateral: Secondary | ICD-10-CM | POA: Diagnosis not present

## 2019-06-06 DIAGNOSIS — B351 Tinea unguium: Secondary | ICD-10-CM | POA: Diagnosis not present

## 2019-06-06 DIAGNOSIS — M79671 Pain in right foot: Secondary | ICD-10-CM | POA: Diagnosis not present

## 2019-06-06 DIAGNOSIS — D2371 Other benign neoplasm of skin of right lower limb, including hip: Secondary | ICD-10-CM | POA: Diagnosis not present

## 2019-06-08 ENCOUNTER — Encounter: Payer: Self-pay | Admitting: Family Medicine

## 2019-06-08 ENCOUNTER — Other Ambulatory Visit: Payer: Self-pay

## 2019-06-08 ENCOUNTER — Ambulatory Visit (INDEPENDENT_AMBULATORY_CARE_PROVIDER_SITE_OTHER): Payer: Medicare Other | Admitting: Family Medicine

## 2019-06-08 VITALS — BP 108/50 | HR 55 | Temp 96.9°F | Resp 15 | Ht 67.0 in | Wt 147.0 lb

## 2019-06-08 DIAGNOSIS — G4733 Obstructive sleep apnea (adult) (pediatric): Secondary | ICD-10-CM | POA: Diagnosis not present

## 2019-06-08 DIAGNOSIS — M816 Localized osteoporosis [Lequesne]: Secondary | ICD-10-CM

## 2019-06-08 DIAGNOSIS — Z9989 Dependence on other enabling machines and devices: Secondary | ICD-10-CM | POA: Diagnosis not present

## 2019-06-08 DIAGNOSIS — E039 Hypothyroidism, unspecified: Secondary | ICD-10-CM

## 2019-06-08 DIAGNOSIS — I2510 Atherosclerotic heart disease of native coronary artery without angina pectoris: Secondary | ICD-10-CM

## 2019-06-08 DIAGNOSIS — I10 Essential (primary) hypertension: Secondary | ICD-10-CM | POA: Diagnosis not present

## 2019-06-08 DIAGNOSIS — Z23 Encounter for immunization: Secondary | ICD-10-CM | POA: Diagnosis not present

## 2019-06-08 DIAGNOSIS — G4710 Hypersomnia, unspecified: Secondary | ICD-10-CM

## 2019-06-08 NOTE — Patient Instructions (Addendum)
.   Please review the attached list of medications and notify my office if there are any errors.   . Please bring all of your medications to every appointment so we can make sure that our medication list is the same as yours.   

## 2019-06-08 NOTE — Progress Notes (Deleted)
Patient: Roger Cook, Male    DOB: [birth year], 85 y.o.   MRN: [id number] Visit Date: 06/08/2019  Today's Provider: Lelon Huh, MD   Chief Complaint  Patient presents with  . Medicare Wellness   Subjective:     Annual wellness visit Roger Cook is a 85 y.o. male. He feels well. He reports exercising walking 66miles a day. He reports he is sleeping well.  -----------------------------------------------------------   Review of Systems  Constitutional: Negative.   HENT: Positive for hearing loss and tinnitus.   Eyes: Negative.   Respiratory: Negative.   Cardiovascular: Negative.   Endocrine: Negative.   Genitourinary: Negative.   Musculoskeletal: Negative.   Skin: Negative.   Allergic/Immunologic: Negative.   Neurological: Negative.   Psychiatric/Behavioral: Negative.     Social History   Socioeconomic History  . Marital status: Married    Spouse name: Not on file  . Number of children: 2  . Years of education: Not on file  . Highest education level: Bachelor's degree (e.g., BA, AB, BS)  Occupational History  . Occupation: retired  Scientific laboratory technician  . Financial resource strain: Not hard at all  . Food insecurity    Worry: Never true    Inability: Never true  . Transportation needs    Medical: No    Non-medical: No  Tobacco Use  . Smoking status: Former Smoker    Types: Cigarettes  . Smokeless tobacco: Never Used  . Tobacco comment: quit in 1963  Substance and Sexual Activity  . Alcohol use: Yes    Alcohol/week: 2.0 standard drinks    Types: 2 Glasses of wine per week  . Drug use: No  . Sexual activity: Yes  Lifestyle  . Physical activity    Days per week: 0 days    Minutes per session: 0 min  . Stress: Not at all  Relationships  . Social Herbalist on phone: Patient refused    Gets together: Patient refused    Attends religious service: Patient refused    Active member of club or organization: Patient refused    Attends  meetings of clubs or organizations: Patient refused    Relationship status: Patient refused  . Intimate partner violence    Fear of current or ex partner: Patient refused    Emotionally abused: Patient refused    Physically abused: Patient refused    Forced sexual activity: Patient refused  Other Topics Concern  . Not on file  Social History Narrative  . Not on file    Past Medical History:  Diagnosis Date  . Anemia   . Bell palsy   . Bladder tumor   . CAD (coronary artery disease)   . Cancer ([place])   . History of prostate cancer   . Hyperlipidemia   . Hypertension   . Lipoma   . MRSA bacteremia 02/2011  . Sleep apnea   . Thyroid disease    hypothyroidism     Patient Active Problem List   Diagnosis Date Noted  . Renal hematoma, right, subsequent encounter 04/25/2018  . Osteoporosis 12/23/2017  . Constipation 11/06/2016  . Bell's palsy 02/26/2015  . Nephrogenic adenoma of bladder 02/26/2015  . Folliculitis [id number]  . Personal history of methicillin resistant Staphylococcus aureus 02/26/2015  . Fatty tumor 02/26/2015  . Syncope and collapse 02/26/2015  . Fungal infection of nail 02/26/2015  . H/O malignant neoplasm of prostate 02/26/2015  . Altered blood in stool 10/18/2012  .  Arteriosclerosis of coronary artery 01/05/2009  . Apnea, sleep 01/05/2009  . Acquired hypothyroidism 01/05/2009  . BP (high blood pressure) 09/22/1998    Past Surgical History:  Procedure Laterality Date  . APPENDECTOMY  2005  . EYE SURGERY  2005   cataract surgery, also had a macular hole in 2005  . EYE SURGERY  2008   catarac surgery  . HAND SURGERY Left 06/26/2011   Malignancy removed from left hand  . NASAL SEPTUM SURGERY  1986  . NASAL SINUS SURGERY  1990  . penile inplant  1984  . polyp of rectum  2003  . PROSTATE SURGERY  1975   Abdominal, had to have radiation treatment with the procedure  . PROSTATE SURGERY    . TONSILLECTOMY      His family history includes Heart  disease in his father; Lung cancer in his mother.   Current Outpatient Medications:  .  Apoaequorin (PREVAGEN PO), Take by mouth daily., Disp: , Rfl:  .  ARTIFICIAL TEAR SOLUTION OP, Apply 1 drop to eye 2 (two) times daily. , Disp: , Rfl:  .  aspirin 81 MG tablet, Take 81 mg by mouth daily. , Disp: , Rfl:  .  brimonidine-timolol (COMBIGAN) 0.2-0.5 % ophthalmic solution, Place 1 drop into the left eye every 12 (twelve) hours. , Disp: , Rfl:  .  CALCIUM CITRATE PO, Take 1 tablet by mouth 2 (two) times daily., Disp: , Rfl:  .  fluticasone (FLONASE) 50 MCG/ACT nasal spray, PLACE 1-2 SPRAYS IN EACH NOSTRIL EVERY DAY AS NEEDED, Disp: 16 g, Rfl: 6 .  magnesium hydroxide (MILK OF MAGNESIA) 400 MG/5ML suspension, Take by mouth daily as needed for mild constipation., Disp: , Rfl:  .  metoprolol succinate (TOPROL-XL) 25 MG 24 hr tablet, TAKE 1/2 TABLET BY MOUTH DAILY  REPLACESATENOLOL, Disp: 45 tablet, Rfl: 6 .  MULTIPLE VITAMIN PO, Take by mouth daily. , Disp: , Rfl:  .  NIFEdipine (PROCARDIA XL/NIFEDICAL XL) 60 MG 24 hr tablet, TAKE ONE TABLET BY MOUTH EVERY DAY, Disp: 90 tablet, Rfl: 4 .  NON FORMULARY, CPAP (Free Text) - Historical Medication  As directed  Started 22-Sep-1994 Active, Disp: , Rfl:  .  psyllium (METAMUCIL) 58.6 % packet, Take 1 packet by mouth daily. , Disp: , Rfl:  .  sodium fluoride (PREVIDENT) 1.1 % GEL dental gel, Place 1 application onto teeth at bedtime. Unsure of dose, Disp: , Rfl:  .  timolol (TIMOPTIC) 0.5 % ophthalmic solution, Place 1 drop into the left eye 2 (two) times daily. , Disp: , Rfl:  .  levothyroxine (SYNTHROID) 88 MCG tablet, TAKE ONE TABLET BY MOUTH EVERY DAY (Patient not taking: Reported on 06/08/2019), Disp: 90 tablet, Rfl: 4 .  loratadine (CLARITIN) 10 MG tablet, Take 10 mg by mouth daily., Disp: , Rfl:  .  polyethylene glycol (MIRALAX / GLYCOLAX) packet, Take 17 g by mouth daily as needed., Disp: , Rfl:  .  pravastatin (PRAVACHOL) 40 MG tablet, TAKE ONE TABLET  EVERY DAY (Patient not taking: Reported on 06/08/2019), Disp: 90 tablet, Rfl: 4 .  senna-docusate (SENOKOT-S) 8.6-50 MG tablet, Take 1 tablet at bedtime as needed by mouth. Take if no BM for more than two days (Patient not taking: Reported on 09/13/2018), Disp: 1 tablet, Rfl: 0  Patient Care Team: [name], MD as PCP - General (Family Medicine) Birder Robson, MD as Referring Physician (Ophthalmology) Abbie Sons, MD (Urology)    Objective:    Vitals: BP (!) 108/50  Pulse (!) 55   Temp (!) 96.9 F (36.1 C) (Oral)   Resp 15   Ht 5\' 7"  (1.702 m)   Wt 147 lb (66.7 kg)   SpO2 98%   BMI 23.02 kg/m   Physical Exam  Activities of Daily Living In your present state of health, do you have any difficulty performing the following activities: 02/02/2019  Hearing? N  Vision? N  Comment Wears eye glasses daily.   Difficulty concentrating or making decisions? N  Walking or climbing stairs? N  Dressing or bathing? N  Doing errands, shopping? N  Preparing Food and eating ? N  Using the Toilet? N  In the past six months, have you accidently leaked urine? Y  Comment Occasionally due to hx of prostate removal.   Do you have problems with loss of bowel control? N  Managing your Medications? N  Managing your Finances? N  Housekeeping or managing your Housekeeping? N  Some recent data might be hidden    Fall Risk Assessment Fall Risk  02/02/2019 01/28/2018 11/06/2016 11/06/2015  Falls in the past year? 0 Yes No No  Number falls in past yr: - 2 or more - -  Injury with Fall? - Yes - -  Comment - kidney injury (?) - -  Follow up - Falls prevention discussed - -     Depression Screen PHQ 2/9 Scores 02/02/2019 01/28/2018 11/06/2016 11/06/2015  PHQ - 2 Score 0 0 0 0    6CIT Screen 11/06/2016  What Year? 0 points  What month? 0 points  What time? 0 points  Count back from 20 0 points  Months in reverse 0 points  Repeat phrase 6 points  Total Score 6      Assessment & Plan:      Annual Wellness Visit  Reviewed patient's Family Medical History Reviewed and updated list of patient's medical providers Assessment of cognitive impairment was done Assessed patient's functional ability Established a written schedule for health screening [place] Completed and Reviewed  Exercise Activities and Dietary recommendations Goals    . DIET - INCREASE WATER INTAKE     Recommend increasing water intake to 6 glasses a day.     . Increase water intake     Starting 11/06/16, I will increase my water intake to 4-6 glasses a day.       Immunization History  Administered Date(s) Administered  . Influenza Split 06/19/2009, 06/11/2010, 05/09/2011, 07/01/2012  . Influenza, High Dose Seasonal PF 06/22/2014, 07/04/2015, 06/05/2016, 06/18/2017, 07/12/2018  . Influenza,inj,Quad PF,6+ Mos 05/28/2013  . Pneumococcal Conjugate-13 03/10/2014  . Pneumococcal Polysaccharide-23 09/22/1996  . Tdap 09/09/2002, 08/10/2013, 03/06/2015  . Zoster 07/16/2006  . Zoster Recombinat (Shingrix) 04/02/2018, 08/04/2018    Health Maintenance  Topic Date Due  . INFLUENZA VACCINE  04/23/2019  . TETANUS/TDAP  03/05/2025  . PNA vac Low Risk Adult  Completed     Discussed health benefits of physical activity, and encouraged him to engage in regular exercise appropriate for his age and condition.    ------------------------------------------------------------------------------------------------------------    Lelon Huh, MD  [place] as a scribe for Lelon Huh, MD.,have documented all relevant documentation on the behalf of Lelon Huh, MD,as directed by  Lelon Huh, MD while in the presence of Lelon Huh, MD.

## 2019-06-08 NOTE — Progress Notes (Signed)
Patient: Roger Cook Male    DOB: 07-Aug-1927   85 y.o.   MRN: [id number] Visit Date: 06/08/2019  Today's Provider: Lelon Huh, MD   Chief Complaint  Patient presents with  . Hypothyroidism  . Hypertension   Subjective:     HPI  Hypertension: Patient here for follow-up of elevated blood pressure. He is not exercising and is adherent to low salt diet.  Blood pressure is well controlled at home.Patient denies chest pain, dyspnea, irregular heart beat, orthopnea and palpitations.    Follow up hypothyroid Lab Results  Component Value Date   TSH 2.920 06/09/2019    He also has long history of OSA on currently CPAP settings for may years feels like it is not working as well, is feeling more fatigued and sleepy during the day. Will dose off quickly when sedentary which is unusual for him   Allergies  Allergen Reactions  . Levofloxacin     lips swelling  . Povidone Iodine   . Sulfa Antibiotics   . Benadryl [Diphenhydramine] Rash     Current Outpatient Medications:  .  Apoaequorin (PREVAGEN PO), Take by mouth daily., Disp: , Rfl:  .  ARTIFICIAL TEAR SOLUTION OP, Apply 1 drop to eye 2 (two) times daily. , Disp: , Rfl:  .  aspirin 81 MG tablet, Take 81 mg by mouth daily. , Disp: , Rfl:  .  brimonidine-timolol (COMBIGAN) 0.2-0.5 % ophthalmic solution, Place 1 drop into the left eye every 12 (twelve) hours. , Disp: , Rfl:  .  CALCIUM CITRATE PO, Take 1 tablet by mouth 2 (two) times daily., Disp: , Rfl:  .  fluticasone (FLONASE) 50 MCG/ACT nasal spray, PLACE 1-2 SPRAYS IN EACH NOSTRIL EVERY DAY AS NEEDED, Disp: 16 g, Rfl: 6 .  magnesium hydroxide (MILK OF MAGNESIA) 400 MG/5ML suspension, Take by mouth daily as needed for mild constipation., Disp: , Rfl:  .  metoprolol succinate (TOPROL-XL) 25 MG 24 hr tablet, TAKE 1/2 TABLET BY MOUTH DAILY  REPLACESATENOLOL, Disp: 45 tablet, Rfl: 6 .  MULTIPLE VITAMIN PO, Take by mouth daily. , Disp: , Rfl:  .  NIFEdipine (PROCARDIA  XL/NIFEDICAL XL) 60 MG 24 hr tablet, TAKE ONE TABLET BY MOUTH EVERY DAY, Disp: 90 tablet, Rfl: 4 .  NON FORMULARY, CPAP (Free Text) - Historical Medication  As directed  Started 22-Sep-1994 Active, Disp: , Rfl:  .  pravastatin (PRAVACHOL) 40 MG tablet, TAKE ONE TABLET EVERY DAY, Disp: 90 tablet, Rfl: 4 .  psyllium (METAMUCIL) 58.6 % packet, Take 1 packet by mouth daily. , Disp: , Rfl:  .  sodium fluoride (PREVIDENT) 1.1 % GEL dental gel, Place 1 application onto teeth at bedtime. Unsure of dose, Disp: , Rfl:  .  timolol (TIMOPTIC) 0.5 % ophthalmic solution, Place 1 drop into the left eye 2 (two) times daily. , Disp: , Rfl:  .  levothyroxine (SYNTHROID) 88 MCG tablet, TAKE ONE TABLET BY MOUTH EVERY DAY (Patient not taking: Reported on 06/08/2019), Disp: 90 tablet, Rfl: 4 .  loratadine (CLARITIN) 10 MG tablet, Take 10 mg by mouth daily., Disp: , Rfl:  .  polyethylene glycol (MIRALAX / GLYCOLAX) packet, Take 17 g by mouth daily as needed., Disp: , Rfl:  .  senna-docusate (SENOKOT-S) 8.6-50 MG tablet, Take 1 tablet at bedtime as needed by mouth. Take if no BM for more than two days (Patient not taking: Reported on 09/13/2018), Disp: 1 tablet, Rfl: 0  Review of Systems  Constitutional:  Positive for fatigue. Negative for appetite change, chills and fever.  Respiratory: Negative for chest tightness, shortness of breath and wheezing.   Cardiovascular: Negative for chest pain and palpitations.  Gastrointestinal: Negative for abdominal pain, nausea and vomiting.    Social History   Tobacco Use  . Smoking status: Former Smoker    Types: Cigarettes  . Smokeless tobacco: Never Used  . Tobacco comment: quit in 1963  Substance Use Topics  . Alcohol use: Yes    Alcohol/week: 2.0 standard drinks    Types: 2 Glasses of wine per week      Objective:   BP (!) 108/50   Pulse (!) 55   Temp (!) 96.9 F (36.1 C) (Oral)   Resp 15   Ht 5\' 7"  (1.702 m)   Wt 147 lb (66.7 kg)   SpO2 98%   BMI 23.02 kg/m   Vitals:   06/08/19 1018  BP: (!) 108/50  Pulse: (!) 55  Resp: 15  Temp: (!) 96.9 F (36.1 C)  TempSrc: Oral  SpO2: 98%  Weight: 147 lb (66.7 kg)  Height: 5\' 7"  (1.702 m)  Body mass index is 23.02 kg/m.   Physical Exam   General Appearance:    Alert, cooperative, no distress  Eyes:    PERRL, conjunctiva/corneas clear, EOM's intact       Lungs:     Clear to auscultation bilaterally, respirations unlabored  Heart:    Bradycardic. Normal rhythm. No murmurs, rubs, or gallops.   MS:   All extremities are intact.   Neurologic:   Awake, alert, oriented x 3. No apparent focal neurological           defect.         Assessment & Plan    1. Arteriosclerosis of coronary artery Asymptomatic. Compliant with medication.  Continue aggressive risk factor modification.   - EKG 12-Lead - Comprehensive metabolic panel - Lipid panel  2. Acquired hypothyroidism  - TSH  3. Essential hypertension Well controlled.  Continue current medications.    4. Localized osteoporosis of spine  - VITAMIN D 25 Hydroxy (Vit-D Deficiency, Fractures)  5. Obstructive sleep apnea on CPAP  - Home sleep test  6. Hypersomnia  Has been on current CPAP setting for several years but symptoms are not controlled. Need to obtain home sleep test on current CPAP settings to see if any adjustments are required.   7. Need for influenza vaccination  - Flu Vaccine QUAD High Dose(Fluad)      Lelon Huh, MD  [facility]

## 2019-06-09 DIAGNOSIS — L570 Actinic keratosis: Secondary | ICD-10-CM | POA: Diagnosis not present

## 2019-06-09 DIAGNOSIS — L308 Other specified dermatitis: Secondary | ICD-10-CM | POA: Diagnosis not present

## 2019-06-09 DIAGNOSIS — E039 Hypothyroidism, unspecified: Secondary | ICD-10-CM | POA: Diagnosis not present

## 2019-06-09 DIAGNOSIS — Z08 Encounter for follow-up examination after completed treatment for malignant neoplasm: Secondary | ICD-10-CM | POA: Diagnosis not present

## 2019-06-09 DIAGNOSIS — D225 Melanocytic nevi of trunk: Secondary | ICD-10-CM | POA: Diagnosis not present

## 2019-06-09 DIAGNOSIS — Z85828 Personal history of other malignant neoplasm of skin: Secondary | ICD-10-CM | POA: Diagnosis not present

## 2019-06-09 DIAGNOSIS — I2510 Atherosclerotic heart disease of native coronary artery without angina pectoris: Secondary | ICD-10-CM | POA: Diagnosis not present

## 2019-06-09 DIAGNOSIS — D2262 Melanocytic nevi of left upper limb, including shoulder: Secondary | ICD-10-CM | POA: Diagnosis not present

## 2019-06-09 DIAGNOSIS — D485 Neoplasm of uncertain behavior of skin: Secondary | ICD-10-CM | POA: Diagnosis not present

## 2019-06-09 DIAGNOSIS — D2271 Melanocytic nevi of right lower limb, including hip: Secondary | ICD-10-CM | POA: Diagnosis not present

## 2019-06-09 DIAGNOSIS — M816 Localized osteoporosis [Lequesne]: Secondary | ICD-10-CM | POA: Diagnosis not present

## 2019-06-09 DIAGNOSIS — D2261 Melanocytic nevi of right upper limb, including shoulder: Secondary | ICD-10-CM | POA: Diagnosis not present

## 2019-06-09 DIAGNOSIS — D2272 Melanocytic nevi of left lower limb, including hip: Secondary | ICD-10-CM | POA: Diagnosis not present

## 2019-06-10 LAB — LIPID PANEL
Chol/HDL Ratio: 3.5 ratio (ref 0.0–5.0)
Cholesterol, Total: 146 mg/dL (ref 100–199)
HDL: 42 mg/dL (ref >39)
LDL Chol Calc (NIH): 83 mg/dL (ref 0–99)
Triglycerides: 116 mg/dL (ref 0–149)
VLDL Cholesterol Cal: 21 mg/dL (ref 5–40)

## 2019-06-10 LAB — COMPREHENSIVE METABOLIC PANEL
ALT: 14 IU/L (ref 0–44)
AST: 23 IU/L (ref 0–40)
Albumin/Globulin Ratio: 1.7 (ref 1.2–2.2)
Albumin: 4 g/dL (ref 3.5–4.6)
Alkaline Phosphatase: 54 IU/L (ref 39–117)
BUN/Creatinine Ratio: 16 (ref 10–24)
BUN: 22 mg/dL (ref 10–36)
Bilirubin Total: 0.2 mg/dL (ref 0.0–1.2)
CO2: 23 mmol/L (ref 20–29)
Calcium: 9.2 mg/dL (ref 8.6–10.2)
Chloride: 104 mmol/L (ref 96–106)
Creatinine, Ser: 1.35 mg/dL — ABNORMAL HIGH (ref 0.76–1.27)
GFR calc Af Amer: 53 mL/min/{1.73_m2} — ABNORMAL LOW (ref >59)
GFR calc non Af Amer: 46 mL/min/{1.73_m2} — ABNORMAL LOW (ref >59)
Globulin, Total: 2.4 g/dL (ref 1.5–4.5)
Glucose: 103 mg/dL — ABNORMAL HIGH (ref 65–99)
Potassium: 4.7 mmol/L (ref 3.5–5.2)
Sodium: 141 mmol/L (ref 134–144)
Total Protein: 6.4 g/dL (ref 6.0–8.5)

## 2019-06-10 LAB — TSH: TSH: 2.92 u[IU]/mL (ref 0.450–4.500)

## 2019-06-10 LAB — VITAMIN D 25 HYDROXY (VIT D DEFICIENCY, FRACTURES): Vit D, 25-Hydroxy: 36.4 ng/mL (ref 30.0–100.0)

## 2019-06-15 ENCOUNTER — Telehealth: Payer: Self-pay | Admitting: Family Medicine

## 2019-06-15 NOTE — Telephone Encounter (Signed)
Pt calling about his in home sleep study. He hasn't heard from anyone regarding setting it up.  Please call pt back with status on this.  Thanks, [employer]

## 2019-06-16 NOTE — Telephone Encounter (Signed)
Dr Caryn Section,  I do not see any order for this.  I also do not see that there is anything in the note from September about needing a sleep study.  Can you review and let us know what needs to be done.  He probably needs documentation regarding this but I wasn't sure if you have that info and can maybe do an addendum if needed. Thanks

## 2019-06-17 NOTE — Telephone Encounter (Signed)
Patient states that he has had his current machine from 11/2010. Patient states that warranty has expired on his motor. KW

## 2019-06-17 NOTE — Telephone Encounter (Signed)
Spoke with patient on the phone who stats that he has been on a CPap machine for 32+ years but is unsure how long he has had his current one. Patient stats he will call office back when he can recall how long he has had current machine. KW

## 2019-06-17 NOTE — Telephone Encounter (Signed)
Judson Roch is working on getting all the records together and Government social research officer. Can you double check with patient and see how long he has had his current CPAP machine?

## 2019-06-23 ENCOUNTER — Encounter: Payer: Self-pay | Admitting: Family Medicine

## 2019-06-29 ENCOUNTER — Telehealth: Payer: Self-pay | Admitting: Family Medicine

## 2019-06-29 ENCOUNTER — Other Ambulatory Visit: Payer: Self-pay | Admitting: Family Medicine

## 2019-06-29 DIAGNOSIS — D2371 Other benign neoplasm of skin of right lower limb, including hip: Secondary | ICD-10-CM | POA: Diagnosis not present

## 2019-06-29 DIAGNOSIS — M216X2 Other acquired deformities of left foot: Secondary | ICD-10-CM | POA: Diagnosis not present

## 2019-06-29 DIAGNOSIS — G4733 Obstructive sleep apnea (adult) (pediatric): Secondary | ICD-10-CM

## 2019-06-29 DIAGNOSIS — Z9989 Dependence on other enabling machines and devices: Secondary | ICD-10-CM

## 2019-06-29 NOTE — Telephone Encounter (Signed)
Sleep study shows severe sleep apnea. Please check with patient to see if he was wearing his current CPAP when test was done and how old his current machine is. He probably needs a new machine.

## 2019-06-30 ENCOUNTER — Telehealth: Payer: Self-pay | Admitting: Family Medicine

## 2019-06-30 NOTE — Telephone Encounter (Signed)
Pt advised.  He states he was not wearing his CPAP machine for the study.  Pt says he needs a new machine because his current one is over 83 years old.   Thanks,   -Mickel Baas

## 2019-06-30 NOTE — Telephone Encounter (Signed)
Is it okay to leave him a copy at the front desk?  Thanks,   -Mickel Baas

## 2019-06-30 NOTE — Telephone Encounter (Signed)
Ok, have sent order for new CPAP machine.

## 2019-06-30 NOTE — Telephone Encounter (Signed)
Pt needing his results for his most recent sleep study.  He wants to come by office and pick it up.  He also wants the results to go to [place] [facility] to get new CPAP machine asap.   Thanks, [employer]

## 2019-07-01 NOTE — Telephone Encounter (Signed)
Order was faxed to [facility] and a copy of pt's sleep study has been placed up front with a release form for pt to sign and pick up sleep study. Pt was advised. Thanks TNP

## 2019-07-01 NOTE — Telephone Encounter (Signed)
I wrote order for [facility] and sent with sleep study report to medical records. Can make a copy for patient to pick up if he likes.

## 2019-08-01 ENCOUNTER — Encounter: Payer: Self-pay | Admitting: Family Medicine

## 2019-08-01 ENCOUNTER — Ambulatory Visit (INDEPENDENT_AMBULATORY_CARE_PROVIDER_SITE_OTHER): Payer: Medicare Other | Admitting: Family Medicine

## 2019-08-01 ENCOUNTER — Other Ambulatory Visit: Payer: Self-pay

## 2019-08-01 VITALS — BP 155/53 | HR 56 | Temp 96.6°F | Wt 148.6 lb

## 2019-08-01 DIAGNOSIS — G4733 Obstructive sleep apnea (adult) (pediatric): Secondary | ICD-10-CM

## 2019-08-01 NOTE — Progress Notes (Signed)
Patient: Roger Cook Male    DOB: [birth year]   85 y.o.   MRN: [id number] Visit Date: 08/01/2019  Today's Provider: Lelon Huh, MD   Chief Complaint  Patient presents with  . Sleep apnea   Subjective:     HPI  Follow up for Sleep Apnea:  The patient was last seen for this 1 months ago. Patient had a sleep study done 06/23/2019 that showed obstructive sleep apnea. CPAP was recommended, and patient was advised to follow up in 1 month after starting.  However he states he got a Voice mail from [place] stating there was a problem and come into the office today to find out what the problem is. However we have not heard anything more from [facility] since order was placed.   ------------------------------------------------------------------------------------  Allergies  Allergen Reactions  . Levofloxacin     lips swelling  . Povidone Iodine   . Sulfa Antibiotics   . Benadryl [Diphenhydramine] Rash     Current Outpatient Medications:  .  Apoaequorin (PREVAGEN PO), Take by mouth daily., Disp: , Rfl:  .  ARTIFICIAL TEAR SOLUTION OP, Apply 1 drop to eye 2 (two) times daily. , Disp: , Rfl:  .  aspirin 81 MG tablet, Take 81 mg by mouth daily. , Disp: , Rfl:  .  brimonidine-timolol (COMBIGAN) 0.2-0.5 % ophthalmic solution, Place 1 drop into the left eye every 12 (twelve) hours. , Disp: , Rfl:  .  CALCIUM CITRATE PO, Take 1 tablet by mouth 2 (two) times daily., Disp: , Rfl:  .  fluticasone (FLONASE) 50 MCG/ACT nasal spray, PLACE 1-2 SPRAYS IN EACH NOSTRIL EVERY DAY AS NEEDED, Disp: 16 g, Rfl: 6 .  loratadine (CLARITIN) 10 MG tablet, Take 10 mg by mouth daily., Disp: , Rfl:  .  magnesium hydroxide (MILK OF MAGNESIA) 400 MG/5ML suspension, Take by mouth daily as needed for mild constipation., Disp: , Rfl:  .  metoprolol succinate (TOPROL-XL) 25 MG 24 hr tablet, TAKE HALF A TABLET BY MOUTH EVERY DAY REPLACES ATENOLOL, Disp: 45 tablet, Rfl: 6 .  MULTIPLE VITAMIN PO, Take by mouth  daily. , Disp: , Rfl:  .  NIFEdipine (PROCARDIA XL/NIFEDICAL XL) 60 MG 24 hr tablet, TAKE ONE TABLET BY MOUTH EVERY DAY, Disp: 90 tablet, Rfl: 4 .  NON FORMULARY, CPAP (Free Text) - Historical Medication  As directed  Started 22-Sep-1994 Active, Disp: , Rfl:  .  polyethylene glycol (MIRALAX / GLYCOLAX) packet, Take 17 g by mouth daily as needed., Disp: , Rfl:  .  pravastatin (PRAVACHOL) 40 MG tablet, TAKE ONE TABLET EVERY DAY, Disp: 90 tablet, Rfl: 4 .  psyllium (METAMUCIL) 58.6 % packet, Take 1 packet by mouth daily. , Disp: , Rfl:  .  sodium fluoride (PREVIDENT) 1.1 % GEL dental gel, Place 1 application onto teeth at bedtime. Unsure of dose, Disp: , Rfl:  .  timolol (TIMOPTIC) 0.5 % ophthalmic solution, Place 1 drop into the left eye 2 (two) times daily. , Disp: , Rfl:  .  levothyroxine (SYNTHROID) 88 MCG tablet, TAKE ONE TABLET BY MOUTH EVERY DAY (Patient not taking: Reported on 06/08/2019), Disp: 90 tablet, Rfl: 4 .  senna-docusate (SENOKOT-S) 8.6-50 MG tablet, Take 1 tablet at bedtime as needed by mouth. Take if no BM for more than two days (Patient not taking: Reported on 09/13/2018), Disp: 1 tablet, Rfl: 0  Review of Systems  Constitutional: Negative for appetite change, chills and fever.  Respiratory: Negative for chest tightness,  shortness of breath and wheezing.   Cardiovascular: Negative for chest pain and palpitations.  Gastrointestinal: Negative for abdominal pain, nausea and vomiting.    Social History   Tobacco Use  . Smoking status: Former Smoker    Types: Cigarettes  . Smokeless tobacco: Never Used  . Tobacco comment: quit in 1963  Substance Use Topics  . Alcohol use: Yes    Alcohol/week: 2.0 standard drinks    Types: 2 Glasses of wine per week      Objective:   BP (!) 155/53 (BP Location: Right Arm, Patient Position: Sitting, Cuff Size: Normal)   Pulse (!) 56   Temp (!) 96.6 F (35.9 C) (Temporal)   Wt 148 lb 9.6 oz (67.4 kg)   SpO2 98%   BMI 23.27 kg/m   Vitals:   08/01/19 1054  BP: (!) 155/53  Pulse: (!) 56  Temp: (!) 96.6 F (35.9 C)  TempSrc: Temporal  SpO2: 98%  Weight: 148 lb 9.6 oz (67.4 kg)  Body mass index is 23.27 kg/m.       Assessment & Plan     1. Severe obstructive sleep apnea Has had current CPAP machine for nearly 10 years and patient is 100% compliant with use. He had greatly improved energy level and wakefulness when he started CPAP years ago, but symptoms have returned over the last several months likely to age of machine. [facility] 06/23/2019 confirmed severe OSA with AHI/RDI/ODI = 57.2/64.9/24.5 and patient is in need of new functional CPAP machine and would likely do better with autotitration. Will contact [facility] to make sure they are working on obtaining new CPAP machine.      Lelon Huh, MD  [facility]

## 2019-08-02 ENCOUNTER — Telehealth: Payer: Self-pay | Admitting: Family Medicine

## 2019-08-02 NOTE — Telephone Encounter (Signed)
Matthew Saras Received 06-08-19 notes.  These were the wrong notes. Needing Nov 9th visit notes for pt to receive CPAP machine.  Please fax 08-01-19 visit notes to [phone].  Thanks, [employer]

## 2019-08-03 NOTE — Telephone Encounter (Signed)
Spoke with Roger Cook at [facility]. She states since the OV note from 9/16 states patient's symptoms were uncontrolled with CPAP that patient would need a follow up appointment. Explained to Roger Cook that patients symptoms were uncontrolled due to CPAP machine being so old. She states she will start a initial setup for a new CPAP machine and will get the main office of [facility] to contact patient to set this up. Patient will then need a follow up with Dr. Caryn Section 30 days after receiving new CPAP machine.

## 2019-08-11 NOTE — Telephone Encounter (Signed)
Can you check with patient and make sure he has heard from [facility] to set up new CPAP machine. Just want to make sure it doesn't fall through the cracks.  Thanks.

## 2019-08-12 NOTE — Telephone Encounter (Signed)
Patient states he has received his new CPAP machine.

## 2019-09-26 ENCOUNTER — Other Ambulatory Visit: Payer: Self-pay | Admitting: Family Medicine

## 2019-09-27 DIAGNOSIS — H903 Sensorineural hearing loss, bilateral: Secondary | ICD-10-CM | POA: Diagnosis not present

## 2019-09-27 DIAGNOSIS — H9312 Tinnitus, left ear: Secondary | ICD-10-CM | POA: Diagnosis not present

## 2019-09-27 DIAGNOSIS — H6123 Impacted cerumen, bilateral: Secondary | ICD-10-CM | POA: Diagnosis not present

## 2019-10-03 ENCOUNTER — Ambulatory Visit: Payer: Self-pay | Admitting: *Deleted

## 2019-10-03 NOTE — Telephone Encounter (Signed)
Patient complaining of Right upper chest pain for 2-3 weeks now. Only has the pain with walking activity and stated it resolves when he rest. Is a Dull ache. Denies SOB/radiating pain/Sweating/dizziness. Reviewed urgent signs he should seek immediate evaluation at the ED. Stated he understood. Appointment made per DT for in office tomorrow at 10:20am.  Reason for Disposition . [1] Chest pain lasts > 5 minutes AND [2] occurred > 3 days ago (72 hours) AND [3] NO chest pain or cardiac symptoms now  Answer Assessment - Initial Assessment Questions 1. LOCATION: "Where does it hurt?"       Right upper chest 2. RADIATION: "Does the pain go anywhere else?" (e.g., into neck, jaw, arms, back)     no 3. ONSET: "When did the chest pain begin?" (Minutes, hours or days)      4-5 weeks 4. PATTERN "Does the pain come and go, or has it been constant since it started?"  "Does it get worse with exertion?"      Comes and goes. 5. DURATION: "How long does it last" (e.g., seconds, minutes, hours)     With activity 6. SEVERITY: "How bad is the pain?"  (e.g., Scale 1-10; mild, moderate, or severe)    - MILD (1-3): doesn't interfere with normal activities     - MODERATE (4-7): interferes with normal activities or awakens from sleep    - SEVERE (8-10): excruciating pain, unable to do any normal activities       Morning 7. CARDIAC RISK FACTORS: "Do you have any history of heart problems or risk factors for heart disease?" (e.g., angina, prior heart attack; diabetes, high blood pressure, high cholesterol, smoker, or strong family history of heart disease)    none 8. PULMONARY RISK FACTORS: "Do you have any history of lung disease?"  (e.g., blood clots in lung, asthma, emphysema, birth control pills)     no 9. CAUSE: "What do you think is causing the chest pain?"    unsure 10. OTHER SYMPTOMS: "Do you have any other symptoms?" (e.g., dizziness, nausea, vomiting, sweating, fever, difficulty breathing, cough)        none 11. PREGNANCY: "Is there any chance you are pregnant?" "When was your last menstrual period?"       na  Protocols used: CHEST PAIN-A-AH

## 2019-10-04 ENCOUNTER — Ambulatory Visit (INDEPENDENT_AMBULATORY_CARE_PROVIDER_SITE_OTHER): Payer: Medicare Other | Admitting: Family Medicine

## 2019-10-04 ENCOUNTER — Encounter: Payer: Self-pay | Admitting: Family Medicine

## 2019-10-04 ENCOUNTER — Other Ambulatory Visit: Payer: Self-pay

## 2019-10-04 VITALS — BP 152/60 | HR 55 | Temp 96.2°F | Resp 18 | Wt 154.0 lb

## 2019-10-04 DIAGNOSIS — R079 Chest pain, unspecified: Secondary | ICD-10-CM | POA: Diagnosis not present

## 2019-10-04 DIAGNOSIS — R413 Other amnesia: Secondary | ICD-10-CM | POA: Diagnosis not present

## 2019-10-04 DIAGNOSIS — I2510 Atherosclerotic heart disease of native coronary artery without angina pectoris: Secondary | ICD-10-CM

## 2019-10-04 MED ORDER — NITROGLYCERIN 0.4 MG SL SUBL: 0.4000 mg | SUBLINGUAL_TABLET | SUBLINGUAL | 3 refills | Status: DC | PRN

## 2019-10-04 MED ORDER — DONEPEZIL HCL 5 MG PO TABS: 5.0000 mg | ORAL_TABLET | Freq: Every day | ORAL | 1 refills | Status: DC

## 2019-10-04 NOTE — Progress Notes (Signed)
Patient: Roger Cook Male    DOB: Apr 21, 1927   86 y.o.   MRN: [id number] Visit Date: 10/04/2019  Today's Provider: Lelon Huh, MD   Chief Complaint  Patient presents with  . Chest Pain    x 3-4 months ago   Subjective:     Chest Pain  This is a new problem. Episode onset: 3-4 months ago. The problem occurs intermittently. The problem has been unchanged. Pain location: across the top of his chest. Quality: aching  The pain does not radiate. Associated symptoms include headaches. Pertinent negatives include no abdominal pain, fever, nausea, palpitations, shortness of breath or vomiting. The pain is aggravated by walking. He has tried rest for the symptoms. The treatment provided significant relief.  Only occurs after walking a few blocks in the afternoons. Has to stop and rest to make pain go away.   Also states he has been having more trouble forgetting things lately. Has been taking Prevegen but feels like memory continues to worsen.    Past Medical History:  Diagnosis Date  . Anemia   . Bell palsy   . Bladder tumor   . CAD (coronary artery disease)   . Cancer ([place])   . History of prostate cancer   . Hyperlipidemia   . Hypertension   . Lipoma   . MRSA bacteremia 02/2011  . Sleep apnea   . Thyroid disease    hypothyroidism    Allergies  Allergen Reactions  . Levofloxacin     lips swelling  . Povidone Iodine   . Sulfa Antibiotics   . Benadryl [Diphenhydramine] Rash     Current Outpatient Medications:  .  Apoaequorin (PREVAGEN PO), Take by mouth daily., Disp: , Rfl:  .  ARTIFICIAL TEAR SOLUTION OP, Apply 1 drop to eye 2 (two) times daily. , Disp: , Rfl:  .  aspirin 81 MG tablet, Take 81 mg by mouth daily. , Disp: , Rfl:  .  brimonidine-timolol (COMBIGAN) 0.2-0.5 % ophthalmic solution, Place 1 drop into the left eye every 12 (twelve) hours. , Disp: , Rfl:  .  CALCIUM CITRATE PO, Take 1 tablet by mouth 2 (two) times daily., Disp: , Rfl:  .  fluticasone  (FLONASE) 50 MCG/ACT nasal spray, PLACE 1-2 SPRAYS IN EACH NOSTRIL EVERY DAY AS NEEDED, Disp: 16 g, Rfl: 6 .  levothyroxine (SYNTHROID) 88 MCG tablet, TAKE ONE TABLET BY MOUTH EVERY DAY, Disp: 90 tablet, Rfl: 4 .  loratadine (CLARITIN) 10 MG tablet, Take 10 mg by mouth daily., Disp: , Rfl:  .  magnesium hydroxide (MILK OF MAGNESIA) 400 MG/5ML suspension, Take by mouth daily as needed for mild constipation., Disp: , Rfl:  .  metoprolol succinate (TOPROL-XL) 25 MG 24 hr tablet, TAKE HALF A TABLET BY MOUTH EVERY DAY REPLACES ATENOLOL, Disp: 45 tablet, Rfl: 6 .  MULTIPLE VITAMIN PO, Take by mouth daily. , Disp: , Rfl:  .  NIFEdipine (PROCARDIA XL/NIFEDICAL XL) 60 MG 24 hr tablet, TAKE ONE TABLET BY MOUTH EVERY DAY, Disp: 90 tablet, Rfl: 4 .  NON FORMULARY, CPAP (Free Text) - Historical Medication  As directed  Started 22-Sep-1994 Active, Disp: , Rfl:  .  polyethylene glycol (MIRALAX / GLYCOLAX) packet, Take 17 g by mouth daily as needed., Disp: , Rfl:  .  pravastatin (PRAVACHOL) 40 MG tablet, TAKE ONE TABLET EVERY DAY, Disp: 90 tablet, Rfl: 4 .  psyllium (METAMUCIL) 58.6 % packet, Take 1 packet by mouth daily. , Disp: , Rfl:  .  senna-docusate (SENOKOT-S) 8.6-50 MG tablet, Take 1 tablet at bedtime as needed by mouth. Take if no BM for more than two days, Disp: 1 tablet, Rfl: 0 .  sodium fluoride (PREVIDENT) 1.1 % GEL dental gel, Place 1 application onto teeth at bedtime. Unsure of dose, Disp: , Rfl:  .  timolol (TIMOPTIC) 0.5 % ophthalmic solution, Place 1 drop into the left eye 2 (two) times daily. , Disp: , Rfl:   Review of Systems  Constitutional: Negative for appetite change, chills and fever.  Respiratory: Negative for chest tightness, shortness of breath and wheezing.   Cardiovascular: Positive for chest pain (when walking). Negative for palpitations.  Gastrointestinal: Negative for abdominal pain, nausea and vomiting.  Neurological: Positive for headaches.  Psychiatric/Behavioral:        Trouble remembering things    Social History   Tobacco Use  . Smoking status: Former Smoker    Types: Cigarettes  . Smokeless tobacco: Never Used  . Tobacco comment: quit in 1963  Substance Use Topics  . Alcohol use: Yes    Alcohol/week: 2.0 standard drinks    Types: 2 Glasses of wine per week      Objective:   BP (!) 152/60 (BP Location: Right Arm, Cuff Size: Normal)   Pulse (!) 55   Temp (!) 96.2 F (35.7 C) (Temporal)   Resp 18   Wt 154 lb (69.9 kg)   SpO2 99% Comment: room air  BMI 24.12 kg/m  Vitals:   10/04/19 1016 10/04/19 1024  BP: (!) 156/60 (!) 152/60  Pulse: (!) 55   Resp: 18   Temp: (!) 96.2 F (35.7 C)   TempSrc: Temporal   SpO2: 99%   Weight: 154 lb (69.9 kg)   Body mass index is 24.12 kg/m.   Physical Exam   General Appearance:    Well developed, well nourished male in no acute distress  Eyes:    PERRL, conjunctiva/corneas clear, EOM's intact       Lungs:     Clear to auscultation bilaterally, respirations unlabored  Heart:    Bradycardic. Normal rhythm. No murmurs, rubs, or gallops.   MS:   All extremities are intact.   Neurologic:   Awake, alert, oriented x 3. No apparent focal neurological           defect.           Assessment & Plan    1. Chest pain, unspecified type  - Ambulatory referral to Cardiology  2. Arteriosclerosis of coronary artery Previously followed by dr. Clayborn Bigness who he has not seen since 2018. Will refer back discuss additional cardiac follow up.   3. Memory problem  - donepezil (ARICEPT) 5 MG tablet; Take 1 tablet (5 mg total) by mouth at bedtime.  Dispense: 30 tablet; Refill: 1  - nitroGLYCERIN (NITROSTAT) 0.4 MG SL tablet; Place 1 tablet (0.4 mg total) under the tongue every 5 (five) minutes as needed for chest pain (Up to 3 tablets).  Dispense: 20 tablet; Refill: 3  The entirety of the information documented in the History of Present Illness, Review of Systems and Physical Exam were personally obtained by me.  Portions of this information were initially documented by Meyer Cory, CMA and reviewed by me for thoroughness and accuracy.      Lelon Huh, MD  [facility]

## 2019-10-04 NOTE — Patient Instructions (Signed)
.   Please review the attached list of medications and notify my office if there are any errors.   . Please bring all of your medications to every appointment so we can make sure that our medication list is the same as yours.    Check your blood pressure while resting at home 2-3 times a week. Let me know if it consistently >140/90

## 2019-10-11 DIAGNOSIS — K219 Gastro-esophageal reflux disease without esophagitis: Secondary | ICD-10-CM | POA: Diagnosis not present

## 2019-10-11 DIAGNOSIS — I10 Essential (primary) hypertension: Secondary | ICD-10-CM | POA: Diagnosis not present

## 2019-10-11 DIAGNOSIS — R9431 Abnormal electrocardiogram [ECG] [EKG]: Secondary | ICD-10-CM | POA: Diagnosis not present

## 2019-10-11 DIAGNOSIS — I208 Other forms of angina pectoris: Secondary | ICD-10-CM | POA: Diagnosis not present

## 2019-10-11 DIAGNOSIS — G4733 Obstructive sleep apnea (adult) (pediatric): Secondary | ICD-10-CM | POA: Diagnosis not present

## 2019-10-11 DIAGNOSIS — R0602 Shortness of breath: Secondary | ICD-10-CM | POA: Diagnosis not present

## 2019-10-11 DIAGNOSIS — I25110 Atherosclerotic heart disease of native coronary artery with unstable angina pectoris: Secondary | ICD-10-CM | POA: Diagnosis not present

## 2019-10-11 DIAGNOSIS — E7800 Pure hypercholesterolemia, unspecified: Secondary | ICD-10-CM | POA: Diagnosis not present

## 2019-11-01 DIAGNOSIS — I208 Other forms of angina pectoris: Secondary | ICD-10-CM | POA: Diagnosis not present

## 2019-11-01 DIAGNOSIS — R0602 Shortness of breath: Secondary | ICD-10-CM | POA: Diagnosis not present

## 2019-11-02 ENCOUNTER — Other Ambulatory Visit: Payer: Self-pay | Admitting: Family Medicine

## 2019-11-02 DIAGNOSIS — R413 Other amnesia: Secondary | ICD-10-CM

## 2019-11-02 NOTE — Telephone Encounter (Signed)
Requested Prescriptions  Pending Prescriptions Disp Refills  . donepezil (ARICEPT) 5 MG tablet [Pharmacy Med Name: DONEPEZIL HCL 5 MG TAB] 30 tablet 1    Sig: TAKE ONE TABLET AT BEDTIME     Neurology:  Alzheimer's Agents Passed - 11/02/2019 10:14 AM      Passed - Valid encounter within last 6 months    Recent Outpatient Visits          4 weeks ago Chest pain, unspecified type   [place], MD   3 months ago Severe obstructive sleep apnea   [facility] [name], MD   4 months ago Arteriosclerosis of coronary artery   [facility] [name], MD   1 year ago Upper respiratory tract infection, unspecified type   [facility] [name], MD   1 year ago Anemia, unspecified type   [facility] [name], MD

## 2019-11-14 ENCOUNTER — Telehealth: Payer: Self-pay

## 2019-11-14 NOTE — Telephone Encounter (Signed)
Patient advised.

## 2019-11-14 NOTE — Telephone Encounter (Signed)
Copied from [place] [id number]. Topic: General - Other >> Nov 14, 2019 11:15 AM Sheran Luz wrote: Patient calling to check status of the results of stress test from 02/09.

## 2019-11-14 NOTE — Telephone Encounter (Signed)
Report says there were no abnormalities, he should hear from cardiologists about whether he needs any other follow up.

## 2019-11-15 DIAGNOSIS — I209 Angina pectoris, unspecified: Secondary | ICD-10-CM | POA: Diagnosis not present

## 2019-11-15 DIAGNOSIS — R0600 Dyspnea, unspecified: Secondary | ICD-10-CM | POA: Diagnosis not present

## 2019-11-15 DIAGNOSIS — G4733 Obstructive sleep apnea (adult) (pediatric): Secondary | ICD-10-CM | POA: Diagnosis not present

## 2019-11-15 DIAGNOSIS — E7800 Pure hypercholesterolemia, unspecified: Secondary | ICD-10-CM | POA: Diagnosis not present

## 2019-11-15 DIAGNOSIS — I10 Essential (primary) hypertension: Secondary | ICD-10-CM | POA: Diagnosis not present

## 2019-11-15 DIAGNOSIS — K219 Gastro-esophageal reflux disease without esophagitis: Secondary | ICD-10-CM | POA: Diagnosis not present

## 2019-11-15 DIAGNOSIS — I25118 Atherosclerotic heart disease of native coronary artery with other forms of angina pectoris: Secondary | ICD-10-CM | POA: Diagnosis not present

## 2019-11-17 DIAGNOSIS — H35342 Macular cyst, hole, or pseudohole, left eye: Secondary | ICD-10-CM | POA: Diagnosis not present

## 2019-11-29 ENCOUNTER — Other Ambulatory Visit: Payer: Self-pay | Admitting: Family Medicine

## 2019-11-29 DIAGNOSIS — R413 Other amnesia: Secondary | ICD-10-CM

## 2020-01-25 ENCOUNTER — Other Ambulatory Visit: Payer: Self-pay | Admitting: Family Medicine

## 2020-01-25 DIAGNOSIS — R413 Other amnesia: Secondary | ICD-10-CM

## 2020-01-25 NOTE — Telephone Encounter (Signed)
Requested Prescriptions  Pending Prescriptions Disp Refills  . donepezil (ARICEPT) 5 MG tablet [Pharmacy Med Name: DONEPEZIL HCL 5 MG TAB] 90 tablet 0    Sig: TAKE ONE TABLET AT BEDTIME     Neurology:  Alzheimer's Agents Passed - 01/25/2020 11:17 AM      Passed - Valid encounter within last 6 months    Recent Outpatient Visits          3 months ago Chest pain, unspecified type   [place], MD   5 months ago Severe obstructive sleep apnea   [facility] [name], MD   7 months ago Arteriosclerosis of coronary artery   [facility] [name], MD   1 year ago Upper respiratory tract infection, unspecified type   [facility] [name], MD   1 year ago Anemia, unspecified type   [facility] [name], MD

## 2020-02-06 ENCOUNTER — Other Ambulatory Visit: Payer: Self-pay | Admitting: Family Medicine

## 2020-02-06 NOTE — Progress Notes (Signed)
Subjective:   Roger Cook is a 86 y.o. male who presents for Medicare Annual/Subsequent preventive examination.  I connected with Ambrose Finland today by telephone and verified that I am speaking with the correct person using two identifiers. Location patient: home Location provider: work Persons participating in the virtual visit: patient, provider.   I discussed the limitations, risks, security and privacy concerns of performing an evaluation and management service by telephone and the availability of in person appointments. I also discussed with the patient that there may be a patient responsible charge related to this service. The patient expressed understanding and verbally consented to this telephonic visit.    Interactive audio and video telecommunications were attempted between this provider and patient, however failed, due to patient having technical difficulties OR patient did not have access to video capability.  We continued and completed visit with audio only.  Review of Systems:  N/A  Cardiac Risk Factors include: advanced age (>52men, >60 women);dyslipidemia;hypertension;male gender     Objective:    Vitals: There were no vitals taken for this visit.  There is no height or weight on file to calculate BMI.  Advanced Directives 02/07/2020 02/02/2019 01/28/2018 11/19/2017 11/06/2016 09/28/2016 08/10/2016  Does Patient Have a Medical Advance Directive? Yes Yes Yes Yes Yes Yes No  Type of Paramedic of [place];Living will [place];Living will [place];Living will [place];Living will [place];Living will Living will -  Copy of [place] in Chart? No - copy requested No - copy requested No - copy requested - No - copy requested - -  Would patient like information on creating a medical advance directive? - - - - - - No - patient declined information     Tobacco Social History   Tobacco Use  Smoking Status Former Smoker  . Types: Cigarettes  Smokeless Tobacco Never Used  Tobacco Comment   quit in 1963     Counseling given: Not Answered Comment: quit in 1963   Clinical Intake:  Pre-visit preparation completed: Yes  Pain : No/denies pain Pain Score: 0-No pain     Nutritional Risks: None Diabetes: No  How often do you need to have someone help you when you read instructions, pamphlets, or other written materials from your doctor or pharmacy?: 1 - Never  Interpreter Needed?: No  Information entered by :: [facility], LPN  Past Medical History:  Diagnosis Date  . Anemia   . Bell palsy   . Bladder tumor   . CAD (coronary artery disease)   . Cancer ([place])   . History of prostate cancer   . Hyperlipidemia   . Hypertension   . Lipoma   . MRSA bacteremia 02/2011  . Sleep apnea   . Thyroid disease    hypothyroidism   Past Surgical History:  Procedure Laterality Date  . APPENDECTOMY  2005  . EYE SURGERY  2005   cataract surgery, also had a macular hole in 2005  . EYE SURGERY  2008   catarac surgery  . HAND SURGERY Left 06/26/2011   Malignancy removed from left hand  . NASAL SEPTUM SURGERY  1986  . NASAL SINUS SURGERY  1990  . penile inplant  1984  . polyp of rectum  2003  . PROSTATE SURGERY  1975   Abdominal, had to have radiation treatment with the procedure  . PROSTATE SURGERY    . TONSILLECTOMY     Family History  Problem Relation Age of Onset  . Lung cancer Mother   . Heart disease Father    Social History   Socioeconomic History  . Marital status: Married    Spouse name: Not on file  . Number of children: 2  . Years of education: Not on file  . Highest education level: Bachelor's degree (e.g., BA, AB, BS)  Occupational History  . Occupation: retired  Tobacco Use  . Smoking status: Former Smoker    Types: Cigarettes  . Smokeless tobacco: Never Used  . Tobacco comment: quit in 1963   Substance and Sexual Activity  . Alcohol use: Yes    Alcohol/week: 2.0 standard drinks    Types: 2 Glasses of wine per week  . Drug use: No  . Sexual activity: Yes  Other Topics Concern  . Not on file  Social History Narrative  . Not on file   Social Determinants of Health   Financial Resource Strain: Low Risk   . Difficulty of Paying Living Expenses: Not hard at all  Food Insecurity: No Food Insecurity  . Worried About Charity fundraiser in the Last Year: Never true  . Ran Out of Food in the Last Year: Never true  Transportation Needs: No Transportation Needs  . Lack of Transportation (Medical): No  . Lack of Transportation (Non-Medical): No  Physical Activity: Sufficiently Active  . Days of Exercise per Week: 6 days  . Minutes of Exercise per Session: 40 min  Stress: No Stress Concern Present  . Feeling of Stress : Not at all  Social Connections: Not Isolated  . Frequency of Communication with Friends and Family: More than three times a week  . Frequency of Social Gatherings with Friends and Family: Twice a week  . Attends Religious Services: More than 4 times per year  . Active Member of Clubs or Organizations: Yes  . Attends Archivist Meetings: More than 4 times per year  . Marital Status: Married    Outpatient Encounter Medications as of 02/07/2020  Medication Sig  . Apoaequorin (PREVAGEN PO) Take by mouth daily.  . ARTIFICIAL TEAR SOLUTION OP Apply 1 drop to eye 2 (two) times daily.   Marland Kitchen aspirin 81 MG tablet Take 81 mg by mouth daily.   . brimonidine-timolol (COMBIGAN) 0.2-0.5 % ophthalmic solution Place 1 drop into the left eye every 12 (twelve) hours.   Marland Kitchen CALCIUM CITRATE PO Take 1 tablet by mouth 2 (two) times daily.  Marland Kitchen donepezil (ARICEPT) 5 MG tablet TAKE ONE TABLET AT BEDTIME  . fluticasone (FLONASE) 50 MCG/ACT nasal spray PLACE 1-2 SPRAYS IN EACH NOSTRIL EVERY DAY AS NEEDED  . levothyroxine (SYNTHROID) 88 MCG tablet TAKE ONE TABLET BY MOUTH EVERY DAY   . loratadine (CLARITIN) 10 MG tablet Take 10 mg by mouth daily.  . magnesium hydroxide (MILK OF MAGNESIA) 400 MG/5ML suspension Take by mouth daily as needed for mild constipation.  . metoprolol succinate (TOPROL-XL) 25 MG 24 hr tablet TAKE HALF A TABLET BY MOUTH EVERY DAY REPLACES ATENOLOL  . MULTIPLE VITAMIN PO Take by mouth daily.   Marland Kitchen NIFEdipine (PROCARDIA XL/NIFEDICAL XL) 60 MG 24 hr tablet TAKE ONE TABLET BY MOUTH EVERY DAY  . nitroGLYCERIN (NITROSTAT) 0.4 MG SL tablet Place 1 tablet (0.4 mg total) under the tongue every 5 (five) minutes as needed for chest pain (Up to 3 tablets).  . NON FORMULARY CPAP (Free Text) - Historical Medication  As directed  Started 22-Sep-1994 Active  . polyethylene glycol (MIRALAX /  GLYCOLAX) packet Take 17 g by mouth daily as needed.  . pravastatin (PRAVACHOL) 40 MG tablet TAKE 1 TABLET BY MOUTH DAILY  . psyllium (METAMUCIL) 58.6 % packet Take 1 packet by mouth daily.   Marland Kitchen senna-docusate (SENOKOT-S) 8.6-50 MG tablet Take 1 tablet at bedtime as needed by mouth. Take if no BM for more than two days  . sodium fluoride (PREVIDENT) 1.1 % GEL dental gel Place 1 application onto teeth at bedtime. Unsure of dose ; as needed  . timolol (TIMOPTIC) 0.5 % ophthalmic solution Place 1 drop into the left eye 2 (two) times daily.    No facility-administered encounter medications on file as of 02/07/2020.    Activities of Daily Living In your present state of health, do you have any difficulty performing the following activities: 02/07/2020  Hearing? N  Vision? N  Difficulty concentrating or making decisions? N  Walking or climbing stairs? N  Dressing or bathing? N  Doing errands, shopping? N  Preparing Food and eating ? N  Using the Toilet? N  In the past six months, have you accidently leaked urine? Y  Comment Due to previous prostate removal.  Do you have problems with loss of bowel control? N  Managing your Medications? N  Managing your Finances? N  Housekeeping or  managing your Housekeeping? N  Some recent data might be hidden    Patient Care Team: [name], MD as PCP - General (Family Medicine) Birder Robson, MD as Referring Physician (Ophthalmology) Abbie Sons, MD (Urology) Dasher, Rayvon Char, MD (Dermatology)   Assessment:   This is a routine wellness examination for [place].  Exercise Activities and Dietary recommendations Current Exercise Habits: Home exercise routine, Type of exercise: calisthenics, Time (Minutes): 45, Frequency (Times/Week): 6, Weekly Exercise (Minutes/Week): 270, Intensity: Mild  Goals    . DIET - INCREASE WATER INTAKE     Recommend increasing water intake to 6 glasses a day.        Fall Risk: Fall Risk  02/07/2020 08/01/2019 02/02/2019 01/28/2018 11/06/2016  Falls in the past year? 0 0 0 Yes No  Number falls in past yr: 0 0 - 2 or more -  Injury with Fall? 0 0 - Yes -  Comment - - - kidney injury (?) -  Follow up - - - Falls prevention discussed -    FALL RISK PREVENTION PERTAINING TO THE HOME:  Any stairs in or around the home? Yes  If so, are there any without handrails? No   Home free of loose throw rugs in walkways, pet beds, electrical cords, etc? Yes  Adequate lighting in your home to reduce risk of falls? Yes   ASSISTIVE DEVICES UTILIZED TO PREVENT FALLS:  Life alert? No  Use of a cane, walker or w/c? No  Grab bars in the bathroom? No  Shower chair or bench in shower? No  Elevated toilet seat or a handicapped toilet? No   TIMED UP AND GO:  Was the test performed? No .    Depression Screen PHQ 2/9 Scores 02/07/2020 02/02/2019 01/28/2018 11/06/2016  PHQ - 2 Score 0 0 0 0    Cognitive Function     6CIT Screen 11/06/2016  What Year? 0 points  What month? 0 points  What time? 0 points  Count back from 20 0 points  Months in reverse 0 points  Repeat phrase 6 points  Total Score 6    Immunization History  Administered Date(s) Administered  . Fluad Quad(high Dose  65+) 06/08/2019   . Influenza Split 06/19/2009, 06/11/2010, 05/09/2011, 07/01/2012  . Influenza, High Dose Seasonal PF 06/22/2014, 07/04/2015, 06/05/2016, 06/18/2017, 07/12/2018  . Influenza,inj,Quad PF,6+ Mos 05/28/2013  . PFIZER SARS-COV-2 Vaccination 09/28/2019, 10/22/2019  . Pneumococcal Conjugate-13 03/10/2014  . Pneumococcal Polysaccharide-23 09/22/1996  . Tdap 09/09/2002, 08/10/2013, 03/06/2015  . Zoster 07/16/2006  . Zoster Recombinat (Shingrix) 04/02/2018, 08/04/2018    Qualifies for Shingles Vaccine? Completed series  Tdap: Up to date  Flu Vaccine: Up to date  Pneumococcal Vaccine: Completed series  Screening Tests Health Maintenance  Topic Date Due  . INFLUENZA VACCINE  04/22/2020  . TETANUS/TDAP  03/05/2025  . COVID-19 Vaccine  Completed  . PNA vac Low Risk Adult  Completed   Cancer Screenings:  Colorectal Screening: No longer required.   Lung Cancer Screening: (Low Dose CT Chest recommended if Age 2-80 years, 30 pack-year currently smoking OR have quit w/in 15years.) does not qualify.   Additional Screening:  Vision Screening: Recommended annual ophthalmology exams for early detection of glaucoma and other disorders of the eye.  Dental Screening: Recommended annual dental exams for proper oral hygiene  Community Resource Referral:  CRR required this visit? No      Plan:  I have personally reviewed and addressed the Medicare Annual Wellness questionnaire and have noted the following in the patient's chart:  A. Medical and social history B. Use of alcohol, tobacco or illicit drugs  C. Current medications and supplements D. Functional ability and status E.  Nutritional status F.  Physical activity G. Advance directives H. List of other physicians I.  Hospitalizations, surgeries, and ER visits in previous 12 months J.  [place] such as hearing and vision if needed, cognitive and depression L. Referrals and appointments   In addition, I have reviewed  and discussed with patient certain preventive protocols, quality metrics, and best practice recommendations. A written personalized care plan for preventive services as well as general preventive health recommendations were provided to patient.   [place]ora Score, [place]  [id number] Nurse Health Advisor   Nurse Notes: None.

## 2020-02-07 ENCOUNTER — Other Ambulatory Visit: Payer: Self-pay

## 2020-02-07 ENCOUNTER — Ambulatory Visit (INDEPENDENT_AMBULATORY_CARE_PROVIDER_SITE_OTHER): Payer: Medicare Other

## 2020-02-07 DIAGNOSIS — Z0000 Encounter for general adult medical examination without abnormal findings: Secondary | ICD-10-CM

## 2020-02-07 NOTE — Patient Instructions (Signed)
Roger Cook , Thank you for taking time to come for your Medicare Wellness Visit. I appreciate your ongoing commitment to your health goals. Please review the following plan we discussed and let me know if I can assist you in the future.   Screening recommendations/referrals: Colonoscopy: No longer required.  Recommended yearly ophthalmology/optometry visit for glaucoma screening and checkup Recommended yearly dental visit for hygiene and checkup  Vaccinations: Influenza vaccine: Up to date Pneumococcal vaccine: Completed series Tdap vaccine: Up to date, due 02/2025 Shingles vaccine: Completed series    Advanced directives: Please bring a copy of your POA (Power of Attorney) and/or Living Will to your next appointment.   Conditions/risks identified: Recommend to increase water intake to 6-8 8 oz glasses a day.  Next appointment: 02/12/21 @ 11:00 AM for Roger Cook 2022 AWV. Pt to call call back and schedule a follow up with PCP.  Preventive Care 56 Years and Older, Male Preventive care refers to lifestyle choices and visits with your health care provider that can promote health and wellness. What does preventive care include?  A yearly physical exam. This is also called an annual well check.  Dental exams once or twice a year.  Routine eye exams. Ask your health care provider how often you should have your eyes checked.  Personal lifestyle choices, including:  Daily care of your teeth and gums.  Regular physical activity.  Eating a healthy diet.  Avoiding tobacco and drug use.  Limiting alcohol use.  Practicing safe sex.  Taking low doses of aspirin every day.  Taking vitamin and mineral supplements as recommended by your health care provider. What happens during an annual well check? The services and screenings done by your health care provider during your annual well check will depend on your age, overall health, lifestyle risk factors, and family history of disease. Counseling   Your health care provider may ask you questions about your:  Alcohol use.  Tobacco use.  Drug use.  Emotional well-being.  Home and relationship well-being.  Sexual activity.  Eating habits.  History of falls.  Memory and ability to understand (cognition).  Work and work Statistician. Screening  You may have the following tests or measurements:  Height, weight, and BMI.  Blood pressure.  Lipid and cholesterol levels. These may be checked every 5 years, or more frequently if you are over 63 years old.  Skin check.  Lung cancer screening. You may have this screening every year starting at age 28 if you have a 30-pack-year history of smoking and currently smoke or have quit within the past 15 years.  Fecal occult blood test (FOBT) of the stool. You may have this test every year starting at age 51.  Flexible sigmoidoscopy or colonoscopy. You may have a sigmoidoscopy every 5 years or a colonoscopy every 10 years starting at age 17.  Prostate cancer screening. Recommendations will vary depending on your family history and other risks.  Hepatitis C blood test.  Hepatitis B blood test.  Sexually transmitted disease (STD) testing.  Diabetes screening. This is done by checking your blood sugar (glucose) after you have not eaten for a while (fasting). You may have this done every 1-3 years.  Abdominal aortic aneurysm (AAA) screening. You may need this if you are a current or former smoker.  Osteoporosis. You may be screened starting at age 76 if you are at high risk. Talk with your health care provider about your test results, treatment options, and if necessary, the need for  more tests. Vaccines  Your health care provider may recommend certain vaccines, such as:  Influenza vaccine. This is recommended every year.  Tetanus, diphtheria, and acellular pertussis (Tdap, Td) vaccine. You may need a Td booster every 10 years.  Zoster vaccine. You may need this after age  77.  Pneumococcal 13-valent conjugate (PCV13) vaccine. One dose is recommended after age 60.  Pneumococcal polysaccharide (PPSV23) vaccine. One dose is recommended after age 45. Talk to your health care provider about which screenings and vaccines you need and how often you need them. This information is not intended to replace advice given to you by your health care provider. Make sure you discuss any questions you have with your health care provider. Document Released: 10/05/2015 Document Revised: 05/28/2016 Document Reviewed: 07/10/2015 Elsevier Interactive Patient Education  2017 [employer] Prevention in the Home Falls can cause injuries. They can happen to people of all ages. There are many things you can do to make your home safe and to help prevent falls. What can I do on the outside of my home?  Regularly fix the edges of walkways and driveways and fix any cracks.  Remove anything that might make you trip as you walk through a door, such as a raised step or threshold.  Trim any bushes or trees on the path to your home.  Use bright outdoor lighting.  Clear any walking paths of anything that might make someone trip, such as rocks or tools.  Regularly check to see if handrails are loose or broken. Make sure that both sides of any steps have handrails.  Any raised decks and porches should have guardrails on the edges.  Have any leaves, snow, or ice cleared regularly.  Use sand or salt on walking paths during winter.  Clean up any spills in your garage right away. This includes oil or grease spills. What can I do in the bathroom?  Use night lights.  Install grab bars by the toilet and in the tub and shower. Do not use towel bars as grab bars.  Use non-skid mats or decals in the tub or shower.  If you need to sit down in the shower, use a plastic, non-slip stool.  Keep the floor dry. Clean up any water that spills on the floor as soon as it happens.  Remove  soap buildup in the tub or shower regularly.  Attach bath mats securely with double-sided non-slip rug tape.  Do not have throw rugs and other things on the floor that can make you trip. What can I do in the bedroom?  Use night lights.  Make sure that you have a light by your bed that is easy to reach.  Do not use any sheets or blankets that are too big for your bed. They should not hang down onto the floor.  Have a firm chair that has side arms. You can use this for support while you get dressed.  Do not have throw rugs and other things on the floor that can make you trip. What can I do in the kitchen?  Clean up any spills right away.  Avoid walking on wet floors.  Keep items that you use a lot in easy-to-reach places.  If you need to reach something above you, use a strong step stool that has a grab bar.  Keep electrical cords out of the way.  Do not use floor polish or wax that makes floors slippery. If you must use wax, use non-skid  floor wax.  Do not have throw rugs and other things on the floor that can make you trip. What can I do with my stairs?  Do not leave any items on the stairs.  Make sure that there are handrails on both sides of the stairs and use them. Fix handrails that are broken or loose. Make sure that handrails are as long as the stairways.  Check any carpeting to make sure that it is firmly attached to the stairs. Fix any carpet that is loose or worn.  Avoid having throw rugs at the top or bottom of the stairs. If you do have throw rugs, attach them to the floor with carpet tape.  Make sure that you have a light switch at the top of the stairs and the bottom of the stairs. If you do not have them, ask someone to add them for you. What else can I do to help prevent falls?  Wear shoes that:  Do not have high heels.  Have rubber bottoms.  Are comfortable and fit you well.  Are closed at the toe. Do not wear sandals.  If you use a  stepladder:  Make sure that it is fully opened. Do not climb a closed stepladder.  Make sure that both sides of the stepladder are locked into place.  Ask someone to hold it for you, if possible.  Clearly mark and make sure that you can see:  Any grab bars or handrails.  First and last steps.  Where the edge of each step is.  Use tools that help you move around (mobility aids) if they are needed. These include:  Canes.  Walkers.  Scooters.  Crutches.  Turn on the lights when you go into a dark area. Replace any light bulbs as soon as they burn out.  Set up your furniture so you have a clear path. Avoid moving your furniture around.  If any of your floors are uneven, fix them.  If there are any pets around you, be aware of where they are.  Review your medicines with your doctor. Some medicines can make you feel dizzy. This can increase your chance of falling. Ask your doctor what other things that you can do to help prevent falls. This information is not intended to replace advice given to you by your health care provider. Make sure you discuss any questions you have with your health care provider. Document Released: 07/05/2009 Document Revised: 02/14/2016 Document Reviewed: 10/13/2014 Elsevier Interactive Patient Education  2017 [employer].

## 2020-03-01 ENCOUNTER — Telehealth: Payer: Self-pay

## 2020-03-01 NOTE — Telephone Encounter (Signed)
Opened in error

## 2020-04-09 ENCOUNTER — Other Ambulatory Visit: Payer: Self-pay | Admitting: Family Medicine

## 2020-05-07 ENCOUNTER — Other Ambulatory Visit: Payer: Self-pay | Admitting: Family Medicine

## 2020-05-23 ENCOUNTER — Encounter: Payer: Self-pay | Admitting: Urology

## 2020-05-23 ENCOUNTER — Other Ambulatory Visit: Payer: Self-pay

## 2020-05-23 ENCOUNTER — Ambulatory Visit (INDEPENDENT_AMBULATORY_CARE_PROVIDER_SITE_OTHER): Payer: Medicare Other | Admitting: Urology

## 2020-05-23 VITALS — BP 167/53 | HR 66 | Ht 67.0 in | Wt 151.6 lb

## 2020-05-23 DIAGNOSIS — R310 Gross hematuria: Secondary | ICD-10-CM | POA: Diagnosis not present

## 2020-05-23 LAB — BLADDER SCAN AMB NON-IMAGING: Scan Result: 0

## 2020-05-23 NOTE — Progress Notes (Signed)
05/23/2020 2:26 PM   Roger Cook Oct 14, 1926 [id number]  Referring provider: [name], [name],  [place] [address]  Chief Complaint  Patient presents with  . Hematuria     HPI: Roger Cook is a 86 y.o. male who requested a visit today for evaluation of gross hematuria.   Onset total gross painless hematuria this morning  Denies dysuria, frequency, urgency  Denies flank, abdominal or pelvic pain  Last saw July 2019  Status post radical prostatectomy in 1980s and underwent radiation therapy 5-6 weeks after surgery  No previous history gross hematuria  Denies daily aspirin or blood thinners  No fever or chills  Denies nausea/vomiting  History right renal laceration with perinephric hematoma   PMH: Past Medical History:  Diagnosis Date  . Anemia   . Bell palsy   . Bladder tumor   . CAD (coronary artery disease)   . Cancer ([place])   . History of prostate cancer   . Hyperlipidemia   . Hypertension   . Lipoma   . MRSA bacteremia 02/2011  . Sleep apnea   . Thyroid disease    hypothyroidism    Surgical History: Past Surgical History:  Procedure Laterality Date  . APPENDECTOMY  2005  . EYE SURGERY  2005   cataract surgery, also had a macular hole in 2005  . EYE SURGERY  2008   catarac surgery  . HAND SURGERY Left 06/26/2011   Malignancy removed from left hand  . NASAL SEPTUM SURGERY  1986  . NASAL SINUS SURGERY  1990  . penile inplant  1984  . polyp of rectum  2003  . PROSTATE SURGERY  1975   Abdominal, had to have radiation treatment with the procedure  . PROSTATE SURGERY    . TONSILLECTOMY      Home Medications:  Allergies as of 05/23/2020      Reactions   Levofloxacin    lips swelling   Povidone Iodine    Sulfa Antibiotics    Benadryl [diphenhydramine] Rash      Medication List       Accurate as of May 23, 2020  2:26 PM. If you have any questions, ask your nurse or doctor.          ARTIFICIAL TEAR SOLUTION OP Apply 1 drop to eye 2 (two) times daily.   aspirin 81 MG tablet Take 81 mg by mouth daily.   CALCIUM CITRATE PO Take 1 tablet by mouth 2 (two) times daily.   Combigan 0.2-0.5 % ophthalmic solution Generic drug: brimonidine-timolol Place 1 drop into the left eye every 12 (twelve) hours.   donepezil 5 MG tablet Commonly known as: ARICEPT TAKE ONE TABLET AT BEDTIME   fluticasone 50 MCG/ACT nasal spray Commonly known as: FLONASE PLACE 1-2 SPRAYS IN EACH NOSTRIL EVERY DAY AS NEEDED   levothyroxine 88 MCG tablet Commonly known as: SYNTHROID TAKE 1 TABLET BY MOUTH DAILY   loratadine 10 MG tablet Commonly known as: CLARITIN Take 10 mg by mouth daily.   magnesium hydroxide 400 MG/5ML suspension Commonly known as: MILK OF MAGNESIA Take by mouth daily as needed for mild constipation.   metoprolol succinate 25 MG 24 hr tablet Commonly known as: TOPROL-XL TAKE HALF A TABLET BY MOUTH EVERY DAY REPLACES ATENOLOL   MULTIPLE VITAMIN PO Take by mouth daily.   NIFEdipine 60 MG 24 hr tablet Commonly known as: PROCARDIA XL/NIFEDICAL XL TAKE ONE TABLET EVERY DAY   nitroGLYCERIN 0.4 MG SL  tablet Commonly known as: NITROSTAT Place 1 tablet (0.4 mg total) under the tongue every 5 (five) minutes as needed for chest pain (Up to 3 tablets).   NON FORMULARY CPAP (Free Text) - Historical Medication  As directed  Started 22-Sep-1994 Active   polyethylene glycol 17 g packet Commonly known as: MIRALAX / GLYCOLAX Take 17 g by mouth daily as needed.   pravastatin 40 MG tablet Commonly known as: PRAVACHOL TAKE 1 TABLET BY MOUTH DAILY   PREVAGEN PO Take by mouth daily.   PreviDent 1.1 % Gel dental gel Generic drug: sodium fluoride Place 1 application onto teeth at bedtime. Unsure of dose ; as needed   psyllium 58.6 % packet Commonly known as: METAMUCIL Take 1 packet by mouth daily.   senna-docusate 8.6-50 MG tablet Commonly known as: Senokot-S Take 1  tablet at bedtime as needed by mouth. Take if no BM for more than two days   timolol 0.5 % ophthalmic solution Commonly known as: TIMOPTIC Place 1 drop into the left eye 2 (two) times daily.       Allergies:  Allergies  Allergen Reactions  . Levofloxacin     lips swelling  . Povidone Iodine   . Sulfa Antibiotics   . Benadryl [Diphenhydramine] Rash    Family History: Family History  Problem Relation Age of Onset  . Lung cancer Mother   . Heart disease Father     Social History:  reports that he has quit smoking. His smoking use included cigarettes. He has never used smokeless tobacco. He reports current alcohol use of about 2.0 standard drinks of alcohol per week. He reports that he does not use drugs.   Physical Exam: BP (!) 167/53   Pulse 66   Ht 5\' 7"  (1.702 m)   Wt 151 lb 9.6 oz (68.8 kg)   BMI 23.74 kg/m   Constitutional:  Alert and oriented, No acute distress. HEENT: [place] AT, moist mucus membranes.  Trachea midline, no masses. Cardiovascular: No clubbing, cyanosis, or edema. Respiratory: Normal respiratory effort, no increased work of breathing. GI: Abdomen is soft, nontender, nondistended, no abdominal masses GU: No CVA tenderness Skin: No rashes, bruises or suspicious lesions. Neurologic: Grossly intact, no focal deficits, moving all 4 extremities. Psychiatric: Normal mood and affect.  Laboratory Data:  Urinalysis Red, cloudy Microscopy >30 RBC/0-5 RBC  Assessment & Plan:    1. Gross hematuria  AUA risk stratification: High  He has a history of pelvic radiation and we discussed possible etiologies of radiation cystitis and urothelial carcinoma as well as potential upper tract causes  He voided a small amount and estimated bladder volume by bladder scan was 0 mL  We discussed the standard evaluation for high risk hematuria and I recommended scheduling CT urogram and cystoscopy  The procedures were discussed and he desires to proceed   Abbie Sons, MD  [facility] [address], [place], [place] [address] [phone]

## 2020-05-24 LAB — URINALYSIS, COMPLETE

## 2020-05-24 LAB — MICROSCOPIC EXAMINATION
Bacteria, UA: NONE SEEN
RBC, Urine: >30 /hpf — AB (ref 0–2)

## 2020-05-28 ENCOUNTER — Encounter: Payer: Self-pay | Admitting: Urology

## 2020-06-04 ENCOUNTER — Ambulatory Visit
Admission: RE | Admit: 2020-06-04 | Discharge: 2020-06-04 | Disposition: A | Discharge status: Home / Self Care | Payer: Medicare Other | Source: Ambulatory Visit | Attending: Urology | Admitting: Urology

## 2020-06-04 ENCOUNTER — Ambulatory Visit: Payer: Medicare Other | Admitting: Family Medicine

## 2020-06-04 ENCOUNTER — Other Ambulatory Visit: Payer: Self-pay

## 2020-06-04 DIAGNOSIS — R310 Gross hematuria: Secondary | ICD-10-CM | POA: Insufficient documentation

## 2020-06-04 DIAGNOSIS — H40052 Ocular hypertension, left eye: Secondary | ICD-10-CM | POA: Diagnosis not present

## 2020-06-04 DIAGNOSIS — N281 Cyst of kidney, acquired: Secondary | ICD-10-CM | POA: Diagnosis not present

## 2020-06-04 DIAGNOSIS — N200 Calculus of kidney: Secondary | ICD-10-CM | POA: Diagnosis not present

## 2020-06-04 DIAGNOSIS — R3129 Other microscopic hematuria: Secondary | ICD-10-CM | POA: Diagnosis not present

## 2020-06-04 LAB — POCT I-STAT CREATININE: Creatinine, Ser: 1.3 mg/dL — ABNORMAL HIGH (ref 0.61–1.24)

## 2020-06-04 MED ORDER — IOHEXOL 300 MG/ML  SOLN
100.0000 mL | Freq: Once | INTRAMUSCULAR | Status: AC | PRN
  Administered 2020-06-04: 100 mL via INTRAVENOUS

## 2020-06-05 ENCOUNTER — Encounter: Payer: Self-pay | Admitting: Family Medicine

## 2020-06-05 ENCOUNTER — Other Ambulatory Visit: Payer: Self-pay | Admitting: Family Medicine

## 2020-06-05 ENCOUNTER — Ambulatory Visit (INDEPENDENT_AMBULATORY_CARE_PROVIDER_SITE_OTHER): Payer: Medicare Other | Admitting: Family Medicine

## 2020-06-05 ENCOUNTER — Other Ambulatory Visit: Payer: Self-pay

## 2020-06-05 VITALS — BP 156/71 | HR 55 | Temp 97.9°F | Resp 16 | Wt 152.0 lb

## 2020-06-05 DIAGNOSIS — I2510 Atherosclerotic heart disease of native coronary artery without angina pectoris: Secondary | ICD-10-CM | POA: Diagnosis not present

## 2020-06-05 DIAGNOSIS — I10 Essential (primary) hypertension: Secondary | ICD-10-CM | POA: Diagnosis not present

## 2020-06-05 DIAGNOSIS — R413 Other amnesia: Secondary | ICD-10-CM | POA: Diagnosis not present

## 2020-06-05 DIAGNOSIS — E039 Hypothyroidism, unspecified: Secondary | ICD-10-CM

## 2020-06-05 DIAGNOSIS — Z23 Encounter for immunization: Secondary | ICD-10-CM | POA: Diagnosis not present

## 2020-06-05 DIAGNOSIS — R079 Chest pain, unspecified: Secondary | ICD-10-CM

## 2020-06-05 MED ORDER — DONEPEZIL HCL 5 MG PO TABS: 5.0000 mg | ORAL_TABLET | Freq: Every day | ORAL | 1 refills | Status: DC

## 2020-06-05 NOTE — Progress Notes (Signed)
Established patient visit   Patient: Roger Cook   DOB: [birth year]   86 y.o. Male  MRN: [id number] Visit Date: 06/05/2020  Today's healthcare provider: Lelon Huh, MD   Chief Complaint  Patient presents with  . Chest Pain   Subjective    Chest Pain  This is a new problem. Episode onset: 4-5 months ago. The problem occurs intermittently. Pertinent negatives include no abdominal pain, fever, nausea, palpitations, shortness of breath or vomiting. The pain is aggravated by exertion and walking.   He states he walks just about every day and pain starts while walking, and resolved when he stops to rest.   Memory problems: Patient was last seen for this problem 7 months ago and was started on Donepezil, Dispense history shows that it was filled for 2 months, but not since May of this year. He's not sure if it was helping, and did not realized he has been out of the medications. He is also taking OTC Prevegen. He would like to start back on Donepezil.   He is also due for follow up of hypertension and hypothyroid.He is taking medications consistently and tolerating well. No dyspnea.   Medications: Outpatient Medications Prior to Visit  Medication Sig  . ARTIFICIAL TEAR SOLUTION OP Apply 1 drop to eye 2 (two) times daily.   Marland Kitchen aspirin 81 MG tablet Take 81 mg by mouth daily.   . brimonidine-timolol (COMBIGAN) 0.2-0.5 % ophthalmic solution Place 1 drop into the left eye every 12 (twelve) hours.   Marland Kitchen CALCIUM CITRATE PO Take 1 tablet by mouth 2 (two) times daily.  Marland Kitchen donepezil (ARICEPT) 5 MG tablet TAKE ONE TABLET AT BEDTIME  . fluticasone (FLONASE) 50 MCG/ACT nasal spray PLACE 1-2 SPRAYS IN EACH NOSTRIL EVERY DAY AS NEEDED  . levothyroxine (SYNTHROID) 88 MCG tablet TAKE 1 TABLET BY MOUTH DAILY  . loratadine (CLARITIN) 10 MG tablet Take 10 mg by mouth daily.  . magnesium hydroxide (MILK OF MAGNESIA) 400 MG/5ML suspension Take by mouth daily as needed for mild constipation.  . MULTIPLE  VITAMIN PO Take by mouth daily.   Marland Kitchen NIFEdipine (PROCARDIA XL/NIFEDICAL XL) 60 MG 24 hr tablet TAKE ONE TABLET EVERY DAY  . nitroGLYCERIN (NITROSTAT) 0.4 MG SL tablet Place 1 tablet (0.4 mg total) under the tongue every 5 (five) minutes as needed for chest pain (Up to 3 tablets).  . NON FORMULARY CPAP (Free Text) - Historical Medication  As directed  Started 22-Sep-1994 Active  . pravastatin (PRAVACHOL) 40 MG tablet TAKE 1 TABLET BY MOUTH DAILY  . psyllium (METAMUCIL) 58.6 % packet Take 1 packet by mouth daily.   Marland Kitchen senna-docusate (SENOKOT-S) 8.6-50 MG tablet Take 1 tablet at bedtime as needed by mouth. Take if no BM for more than two days  . sodium fluoride (PREVIDENT) 1.1 % GEL dental gel Place 1 application onto teeth at bedtime. Unsure of dose ; as needed  . timolol (TIMOPTIC) 0.5 % ophthalmic solution Place 1 drop into the left eye 2 (two) times daily.   Marland Kitchen Apoaequorin (PREVAGEN PO) Take by mouth daily. (Patient not taking: Reported on 06/05/2020)  . metoprolol succinate (TOPROL-XL) 25 MG 24 hr tablet TAKE HALF A TABLET BY MOUTH EVERY DAY REPLACES ATENOLOL (Patient not taking: Reported on 06/05/2020)  . polyethylene glycol (MIRALAX / GLYCOLAX) packet Take 17 g by mouth daily as needed. (Patient not taking: Reported on 06/05/2020)   No facility-administered medications prior to visit.    Review of Systems  Constitutional:  Negative for appetite change, chills and fever.  Respiratory: Negative for chest tightness, shortness of breath and wheezing.   Cardiovascular: Positive for chest pain. Negative for palpitations.  Gastrointestinal: Negative for abdominal pain, nausea and vomiting.  Musculoskeletal: Positive for myalgias (leg pain).  Psychiatric/Behavioral:       Memory problems     Objective    BP (!) 156/71 (BP Location: Right Arm, Patient Position: Sitting, Cuff Size: Normal)   Pulse (!) 55   Temp 97.9 F (36.6 C) (Oral)   Resp 16   Wt 152 lb (68.9 kg)   BMI 23.81 kg/m   Physical  Exam   General: Appearance:    Well developed, well nourished male in no acute distress  Eyes:    PERRL, conjunctiva/corneas clear, EOM's intact       Lungs:     Clear to auscultation bilaterally, respirations unlabored  Heart:    Bradycardic. Normal rhythm. No murmurs, rubs, or gallops.   MS:   All extremities are intact.   Neurologic:   Awake, alert, oriented x 3. No apparent focal neurological           defect.         No results found for any visits on 06/05/20.  Assessment & Plan     1. Chest pain, unspecified type He is followed by Dr. Clayborn Bigness and had low risk Myoview in February. No EKG changes today. Will check - Troponin T He does have nitroglycerine prescription.   2. Memory problem He doesn't recall if Aricept was working when he was taking it. Will send in new prescription for another trial - donepezil (ARICEPT) 5 MG tablet; Take 1 tablet (5 mg total) by mouth at bedtime.  Dispense: 90 tablet; Refill: 1  3. Need for influenza vaccination  - Flu Vaccine QUAD High Dose IM (Fluad)   4. Hypertension Well controlled.  Continue current medications.    5. Hypothyroid Clinically euthyroid Check TSH today       The entirety of the information documented in the History of Present Illness, Review of Systems and Physical Exam were personally obtained by me. Portions of this information were initially documented by the CMA and reviewed by me for thoroughness and accuracy.       Lelon Huh, MD  [facility] [phone] (phone) [phone] (fax)  [place]

## 2020-06-05 NOTE — Patient Instructions (Signed)
.   Please review the attached list of medications and notify my office if there are any errors.   . Please bring all of your medications to every appointment so we can make sure that our medication list is the same as yours.   

## 2020-06-06 ENCOUNTER — Other Ambulatory Visit: Payer: Self-pay | Admitting: Family Medicine

## 2020-06-06 ENCOUNTER — Other Ambulatory Visit: Payer: Self-pay | Admitting: Urology

## 2020-06-06 DIAGNOSIS — R079 Chest pain, unspecified: Secondary | ICD-10-CM

## 2020-06-06 LAB — COMPREHENSIVE METABOLIC PANEL
ALT: 12 IU/L (ref 0–44)
AST: 21 IU/L (ref 0–40)
Albumin/Globulin Ratio: 1.8 (ref 1.2–2.2)
Albumin: 4.4 g/dL (ref 3.5–4.6)
Alkaline Phosphatase: 55 IU/L (ref 44–121)
BUN/Creatinine Ratio: 14 (ref 10–24)
BUN: 20 mg/dL (ref 10–36)
Bilirubin Total: <0.2 mg/dL (ref 0.0–1.2)
CO2: 23 mmol/L (ref 20–29)
Calcium: 9.1 mg/dL (ref 8.6–10.2)
Chloride: 103 mmol/L (ref 96–106)
Creatinine, Ser: 1.38 mg/dL — ABNORMAL HIGH (ref 0.76–1.27)
GFR calc Af Amer: 51 mL/min/{1.73_m2} — ABNORMAL LOW (ref >59)
GFR calc non Af Amer: 44 mL/min/{1.73_m2} — ABNORMAL LOW (ref >59)
Globulin, Total: 2.4 g/dL (ref 1.5–4.5)
Glucose: 119 mg/dL — ABNORMAL HIGH (ref 65–99)
Potassium: 4.4 mmol/L (ref 3.5–5.2)
Sodium: 138 mmol/L (ref 134–144)
Total Protein: 6.8 g/dL (ref 6.0–8.5)

## 2020-06-06 LAB — CBC
Hematocrit: 27.6 % — ABNORMAL LOW (ref 37.5–51.0)
Hemoglobin: 8.4 g/dL — ABNORMAL LOW (ref 13.0–17.7)
MCH: 25.9 pg — ABNORMAL LOW (ref 26.6–33.0)
MCHC: 30.4 g/dL — ABNORMAL LOW (ref 31.5–35.7)
MCV: 85 fL (ref 79–97)
Platelets: 268 10*3/uL (ref 150–450)
RBC: 3.24 x10E6/uL — ABNORMAL LOW (ref 4.14–5.80)
RDW: 14.3 % (ref 11.6–15.4)
WBC: 5 10*3/uL (ref 3.4–10.8)

## 2020-06-06 LAB — TROPONIN T: Troponin T (Highly Sensitive): 26 ng/L (ref 0–22)

## 2020-06-06 LAB — TSH: TSH: 2.69 u[IU]/mL (ref 0.450–4.500)

## 2020-06-06 MED ORDER — ISOSORBIDE MONONITRATE ER 30 MG PO TB24: 30.0000 mg | ORAL_TABLET | Freq: Every day | ORAL | 6 refills | Status: DC

## 2020-06-07 ENCOUNTER — Encounter: Payer: Self-pay | Admitting: Urology

## 2020-06-13 ENCOUNTER — Ambulatory Visit: Payer: Medicare Other

## 2020-06-15 ENCOUNTER — Telehealth: Payer: Self-pay | Admitting: Urology

## 2020-06-15 NOTE — Telephone Encounter (Signed)
Patient was a no-show for cystoscopy 9/15. Recommend rescheduling

## 2020-06-25 DIAGNOSIS — D225 Melanocytic nevi of trunk: Secondary | ICD-10-CM | POA: Diagnosis not present

## 2020-06-25 DIAGNOSIS — D2261 Melanocytic nevi of right upper limb, including shoulder: Secondary | ICD-10-CM | POA: Diagnosis not present

## 2020-06-25 DIAGNOSIS — X32XXXA Exposure to sunlight, initial encounter: Secondary | ICD-10-CM | POA: Diagnosis not present

## 2020-06-25 DIAGNOSIS — L821 Other seborrheic keratosis: Secondary | ICD-10-CM | POA: Diagnosis not present

## 2020-06-25 DIAGNOSIS — R202 Paresthesia of skin: Secondary | ICD-10-CM | POA: Diagnosis not present

## 2020-06-25 DIAGNOSIS — D2262 Melanocytic nevi of left upper limb, including shoulder: Secondary | ICD-10-CM | POA: Diagnosis not present

## 2020-06-25 DIAGNOSIS — Z85828 Personal history of other malignant neoplasm of skin: Secondary | ICD-10-CM | POA: Diagnosis not present

## 2020-06-25 DIAGNOSIS — D2272 Melanocytic nevi of left lower limb, including hip: Secondary | ICD-10-CM | POA: Diagnosis not present

## 2020-06-25 DIAGNOSIS — D2271 Melanocytic nevi of right lower limb, including hip: Secondary | ICD-10-CM | POA: Diagnosis not present

## 2020-06-25 DIAGNOSIS — Z08 Encounter for follow-up examination after completed treatment for malignant neoplasm: Secondary | ICD-10-CM | POA: Diagnosis not present

## 2020-06-25 DIAGNOSIS — L570 Actinic keratosis: Secondary | ICD-10-CM | POA: Diagnosis not present

## 2020-06-28 DIAGNOSIS — I25118 Atherosclerotic heart disease of native coronary artery with other forms of angina pectoris: Secondary | ICD-10-CM | POA: Diagnosis not present

## 2020-06-28 DIAGNOSIS — I10 Essential (primary) hypertension: Secondary | ICD-10-CM | POA: Diagnosis not present

## 2020-06-28 DIAGNOSIS — G4733 Obstructive sleep apnea (adult) (pediatric): Secondary | ICD-10-CM | POA: Diagnosis not present

## 2020-06-28 DIAGNOSIS — K219 Gastro-esophageal reflux disease without esophagitis: Secondary | ICD-10-CM | POA: Diagnosis not present

## 2020-06-28 DIAGNOSIS — I2510 Atherosclerotic heart disease of native coronary artery without angina pectoris: Secondary | ICD-10-CM | POA: Diagnosis not present

## 2020-06-28 DIAGNOSIS — Z8679 Personal history of other diseases of the circulatory system: Secondary | ICD-10-CM | POA: Diagnosis not present

## 2020-06-28 DIAGNOSIS — R9431 Abnormal electrocardiogram [ECG] [EKG]: Secondary | ICD-10-CM | POA: Diagnosis not present

## 2020-06-28 DIAGNOSIS — I209 Angina pectoris, unspecified: Secondary | ICD-10-CM | POA: Diagnosis not present

## 2020-06-28 DIAGNOSIS — R0600 Dyspnea, unspecified: Secondary | ICD-10-CM | POA: Diagnosis not present

## 2020-08-10 DIAGNOSIS — I208 Other forms of angina pectoris: Secondary | ICD-10-CM | POA: Diagnosis not present

## 2020-08-10 DIAGNOSIS — I10 Essential (primary) hypertension: Secondary | ICD-10-CM | POA: Diagnosis not present

## 2020-08-10 DIAGNOSIS — K219 Gastro-esophageal reflux disease without esophagitis: Secondary | ICD-10-CM | POA: Diagnosis not present

## 2020-08-10 DIAGNOSIS — E7800 Pure hypercholesterolemia, unspecified: Secondary | ICD-10-CM | POA: Diagnosis not present

## 2020-08-10 DIAGNOSIS — I209 Angina pectoris, unspecified: Secondary | ICD-10-CM | POA: Diagnosis not present

## 2020-08-10 DIAGNOSIS — I25118 Atherosclerotic heart disease of native coronary artery with other forms of angina pectoris: Secondary | ICD-10-CM | POA: Diagnosis not present

## 2020-08-10 DIAGNOSIS — R0600 Dyspnea, unspecified: Secondary | ICD-10-CM | POA: Diagnosis not present

## 2020-08-10 DIAGNOSIS — G4733 Obstructive sleep apnea (adult) (pediatric): Secondary | ICD-10-CM | POA: Diagnosis not present

## 2020-08-10 DIAGNOSIS — Z8679 Personal history of other diseases of the circulatory system: Secondary | ICD-10-CM | POA: Diagnosis not present

## 2020-08-10 DIAGNOSIS — I2510 Atherosclerotic heart disease of native coronary artery without angina pectoris: Secondary | ICD-10-CM | POA: Diagnosis not present

## 2020-08-20 ENCOUNTER — Encounter: Payer: Self-pay | Admitting: Family Medicine

## 2020-08-20 ENCOUNTER — Ambulatory Visit (INDEPENDENT_AMBULATORY_CARE_PROVIDER_SITE_OTHER): Payer: Medicare Other | Admitting: Family Medicine

## 2020-08-20 ENCOUNTER — Other Ambulatory Visit: Payer: Self-pay

## 2020-08-20 VITALS — BP 170/66 | HR 89 | Temp 98.0°F | Resp 16 | Wt 158.0 lb

## 2020-08-20 DIAGNOSIS — I2510 Atherosclerotic heart disease of native coronary artery without angina pectoris: Secondary | ICD-10-CM

## 2020-08-20 DIAGNOSIS — I209 Angina pectoris, unspecified: Secondary | ICD-10-CM

## 2020-08-20 MED ORDER — ISOSORBIDE MONONITRATE ER 60 MG PO TB24: 60.0000 mg | ORAL_TABLET | Freq: Every day | ORAL | 0 refills | Status: DC

## 2020-08-20 NOTE — Progress Notes (Signed)
Established patient visit   Patient: Roger Cook   DOB: October 17, 1926   86 y.o. Male  MRN: [id number] Visit Date: 08/20/2020  Today's healthcare provider: Lelon Huh, MD   Chief Complaint  Patient presents with  . Chest Pain   Subjective    Chest Pain  This is a new problem. Episode onset: 3-4 months ago. The problem has been unchanged. Pain location: center of chest. The pain does not radiate. Pertinent negatives include no abdominal pain, fever, nausea, palpitations, shortness of breath or vomiting. The pain is aggravated by walking and exertion. He has tried rest for the symptoms.   Patient reports that the pain only occurs with exertion or walking, then resolves once he rests. He is followed by Dr. Clayborn Bigness and had unremarkable stress test earlier this year.      Medications: Outpatient Medications Prior to Visit  Medication Sig  . Apoaequorin (PREVAGEN PO) Take by mouth daily. (Patient not taking: Reported on 06/05/2020)  . ARTIFICIAL TEAR SOLUTION OP Apply 1 drop to eye 2 (two) times daily.   Marland Kitchen aspirin 81 MG tablet Take 81 mg by mouth daily.   . brimonidine-timolol (COMBIGAN) 0.2-0.5 % ophthalmic solution Place 1 drop into the left eye every 12 (twelve) hours.   Marland Kitchen CALCIUM CITRATE PO Take 1 tablet by mouth 2 (two) times daily.  Marland Kitchen donepezil (ARICEPT) 5 MG tablet Take 1 tablet (5 mg total) by mouth at bedtime.  . fluticasone (FLONASE) 50 MCG/ACT nasal spray PLACE 1-2 SPRAYS IN EACH NOSTRIL EVERY DAY AS NEEDED  . isosorbide mononitrate (IMDUR) 30 MG 24 hr tablet Take 1 tablet (30 mg total) by mouth daily.  Marland Kitchen levothyroxine (SYNTHROID) 88 MCG tablet TAKE 1 TABLET BY MOUTH DAILY  . loratadine (CLARITIN) 10 MG tablet Take 10 mg by mouth daily.  . magnesium hydroxide (MILK OF MAGNESIA) 400 MG/5ML suspension Take by mouth daily as needed for mild constipation.  . metoprolol succinate (TOPROL-XL) 25 MG 24 hr tablet TAKE HALF A TABLET BY MOUTH EVERY DAY REPLACES ATENOLOL (Patient  not taking: Reported on 06/05/2020)  . MULTIPLE VITAMIN PO Take by mouth daily.   Marland Kitchen NIFEdipine (PROCARDIA XL/NIFEDICAL XL) 60 MG 24 hr tablet TAKE ONE TABLET EVERY DAY  . nitroGLYCERIN (NITROSTAT) 0.4 MG SL tablet Place 1 tablet (0.4 mg total) under the tongue every 5 (five) minutes as needed for chest pain (Up to 3 tablets).  . NON FORMULARY CPAP (Free Text) - Historical Medication  As directed  Started 22-Sep-1994 Active  . polyethylene glycol (MIRALAX / GLYCOLAX) packet Take 17 g by mouth daily as needed. (Patient not taking: Reported on 06/05/2020)  . pravastatin (PRAVACHOL) 40 MG tablet TAKE 1 TABLET BY MOUTH DAILY  . psyllium (METAMUCIL) 58.6 % packet Take 1 packet by mouth daily.   Marland Kitchen senna-docusate (SENOKOT-S) 8.6-50 MG tablet Take 1 tablet at bedtime as needed by mouth. Take if no BM for more than two days  . sodium fluoride (PREVIDENT) 1.1 % GEL dental gel Place 1 application onto teeth at bedtime. Unsure of dose ; as needed  . timolol (TIMOPTIC) 0.5 % ophthalmic solution Place 1 drop into the left eye 2 (two) times daily.    No facility-administered medications prior to visit.    Review of Systems  Constitutional: Negative for appetite change, chills and fever.  Respiratory: Negative for chest tightness, shortness of breath and wheezing.   Cardiovascular: Positive for chest pain. Negative for palpitations.  Gastrointestinal: Negative for abdominal pain, nausea  and vomiting.      Objective    BP (!) 170/66 (BP Location: Left Arm, Patient Position: Sitting, Cuff Size: Normal)   Pulse 89   Temp 98 F (36.7 C) (Oral)   Resp 16   Wt 158 lb (71.7 kg)   BMI 24.75 kg/m    Physical Exam   General: Appearance:    Well developed, well nourished male in no acute distress  Eyes:    PERRL, conjunctiva/corneas clear, EOM's intact       Lungs:     Clear to auscultation bilaterally, respirations unlabored  Heart:    Normal heart rate. Normal rhythm. No murmurs, rubs, or gallops.   MS:    No chest wall tenderness.   Neurologic:   Awake, alert, oriented x 3. No apparent focal neurological           defect.          Assessment & Plan     1. Angina pectoris (HCC) Increase from 30mg  to  isosorbide mononitrate (IMDUR) 60 MG 24 hr tablet; Take 1 tablet (60 mg total) by mouth daily.  Dispense: 30 tablet; Refill: 0 Follow up in 4 weeks.        The entirety of the information documented in the History of Present Illness, Review of Systems and Physical Exam were personally obtained by me. Portions of this information were initially documented by the CMA and reviewed by me for thoroughness and accuracy.     Lelon Huh, MD  [facility] [phone] (phone) [phone] (fax)  [place]

## 2020-08-20 NOTE — Patient Instructions (Signed)
.   Please review the attached list of medications and notify my office if there are any errors.   . We are going to change to the dose of isosorbide mononitrate from 30mg  to 60mg  once a day

## 2020-09-17 NOTE — Progress Notes (Signed)
Established patient visit   Patient: Roger Cook   DOB: 1927/08/04   86 y.o. Male  MRN: [id number] Visit Date: 09/18/2020  Today's healthcare provider: Mila Merry, MD   Chief Complaint  Patient presents with  . Chest Pain  . memory problems   Subjective    HPI   Follow up for Angina pectoris  The patient was last seen for this 4 weeks ago. Changes made at last visit include increasing isosorbide from 30mg  to 60mg  daily.  He reports good compliance with treatment. He feels that condition is resolved. He is not having side effects.   Follow up for Memory problems:  The patient was last seen for this 3 months ago. From that visit it was noted that patient couldn't recall if Aricept was working when he was taking it. New prescription for another trial sent into pharmacy.   He reports good compliance with treatment. He feels that condition is Unchanged. He is not having side effects.   -----------------------------------------------------------------------------------------        Medications: Outpatient Medications Prior to Visit  Medication Sig  . Apoaequorin (PREVAGEN PO) Take by mouth daily.   . ARTIFICIAL TEAR SOLUTION OP Apply 1 drop to eye 2 (two) times daily.   aspirin 81 MG tablet Take 81 mg by mouth daily.   . brimonidine-timolol (COMBIGAN) 0.2-0.5 % ophthalmic solution Place 1 drop into the left eye every 12 (twelve) hours.  . fluticasone (FLONASE) 50 MCG/ACT nasal spray PLACE 1-2 SPRAYS IN EACH NOSTRIL EVERY DAY AS NEEDED  . isosorbide mononitrate (IMDUR) 60 MG 24 hr tablet Take 1 tablet (60 mg total) by mouth daily.  levothyroxine (SYNTHROID) 88 MCG tablet TAKE 1 TABLET BY MOUTH DAILY  . MULTIPLE VITAMIN PO Take by mouth daily.   Marland Kitchen NIFEdipine (PROCARDIA XL/NIFEDICAL XL) 60 MG 24 hr tablet TAKE ONE TABLET EVERY DAY  . NON FORMULARY CPAP (Free Text) - Historical Medication  As directed  Started 22-Sep-1994 Active  . pravastatin (PRAVACHOL)  40 MG tablet TAKE 1 TABLET BY MOUTH DAILY  . senna-docusate (SENOKOT-S) 8.6-50 MG tablet Take 1 tablet at bedtime as needed by mouth. Take if no BM for more than two days  . timolol (TIMOPTIC) 0.5 % ophthalmic solution Place 1 drop into the left eye 2 (two) times daily.   02-20-1999 donepezil (ARICEPT) 5 MG tablet Take 1 tablet (5 mg total) by mouth at bedtime. (Patient not taking: Reported on 09/18/2020)  . loratadine (CLARITIN) 10 MG tablet Take 10 mg by mouth daily. (Patient not taking: No sig reported)  . magnesium hydroxide (MILK OF MAGNESIA) 400 MG/5ML suspension Take by mouth daily as needed for mild constipation.  . metoprolol succinate (TOPROL-XL) 25 MG 24 hr tablet TAKE HALF A TABLET BY MOUTH EVERY DAY REPLACES ATENOLOL (Patient not taking: Reported on 09/18/2020)  . nitroGLYCERIN (NITROSTAT) 0.4 MG SL tablet Place 1 tablet (0.4 mg total) under the tongue every 5 (five) minutes as needed for chest pain (Up to 3 tablets). (Patient not taking: No sig reported)  . polyethylene glycol (MIRALAX / GLYCOLAX) packet Take 17 g by mouth daily as needed. (Patient not taking: No sig reported)  . psyllium (METAMUCIL) 58.6 % packet Take 1 packet by mouth daily.  (Patient not taking: No sig reported)  . [DISCONTINUED] CALCIUM CITRATE PO Take 1 tablet by mouth 2 (two) times daily. (Patient not taking: No sig reported)  . [DISCONTINUED] sodium fluoride (FLUORISHIELD) 1.1 % GEL dental gel Place  1 application onto teeth at bedtime. Unsure of dose ; as needed (Patient not taking: Reported on 09/18/2020)   No facility-administered medications prior to visit.    Review of Systems  Constitutional: Negative for appetite change, chills and fever.  Respiratory: Negative for chest tightness, shortness of breath and wheezing.   Cardiovascular: Negative for chest pain and palpitations.  Gastrointestinal: Negative for abdominal pain, nausea and vomiting.  Psychiatric/Behavioral: Positive for confusion (memory problems).       Objective    BP 129/66 (BP Location: Left Arm, Patient Position: Sitting, Cuff Size: Normal)   Pulse 61   Temp 98.1 F (36.7 C) (Temporal)   Resp 16   Wt 155 lb (70.3 kg)   BMI 24.28 kg/m    Physical Exam  General appearance: Well developed, well nourished male, cooperative and in no acute distress Head: Normocephalic, without obvious abnormality, atraumatic Respiratory: Respirations even and unlabored, normal respiratory rate Extremities: All extremities are intact.  Skin: Skin color, texture, turgor normal. No rashes seen  Psych: Appropriate mood and affect. Neurologic: Mental status: Alert, oriented to person, place, and time, thought content appropriate.   No results found for any visits on 09/18/20.  Assessment & Plan     1. Angina pectoris ([place]) Resolved since increase isosorbide from 30 to 60 mg. Continue current medications.    2. Memory problem Didn't notice any improvement with 5mg  donepezil. Will try 10mg , consider Exelon if not improved at follow up in 3 months. . - donepezil (ARICEPT) 10 MG tablet; Take 1 tablet (10 mg total) by mouth at bedtime.  Dispense: 90 tablet; Refill: 0   No follow-ups on file.      The entirety of the information documented in the History of Present Illness, Review of Systems and Physical Exam were personally obtained by me. Portions of this information were initially documented by the CMA and reviewed by me for thoroughness and accuracy.      Lelon Huh, MD  [facility] [phone] (phone) [phone] (fax)  [place]

## 2020-09-18 ENCOUNTER — Other Ambulatory Visit: Payer: Self-pay

## 2020-09-18 ENCOUNTER — Encounter: Payer: Self-pay | Admitting: Family Medicine

## 2020-09-18 ENCOUNTER — Ambulatory Visit (INDEPENDENT_AMBULATORY_CARE_PROVIDER_SITE_OTHER): Payer: Medicare Other | Admitting: Family Medicine

## 2020-09-18 VITALS — BP 129/66 | HR 61 | Temp 98.1°F | Resp 16 | Wt 155.0 lb

## 2020-09-18 DIAGNOSIS — R413 Other amnesia: Secondary | ICD-10-CM | POA: Diagnosis not present

## 2020-09-18 DIAGNOSIS — I2510 Atherosclerotic heart disease of native coronary artery without angina pectoris: Secondary | ICD-10-CM | POA: Diagnosis not present

## 2020-09-18 DIAGNOSIS — I209 Angina pectoris, unspecified: Secondary | ICD-10-CM

## 2020-09-18 DIAGNOSIS — F0390 Unspecified dementia without behavioral disturbance: Secondary | ICD-10-CM | POA: Insufficient documentation

## 2020-09-18 MED ORDER — ISOSORBIDE MONONITRATE ER 60 MG PO TB24: 60.0000 mg | ORAL_TABLET | Freq: Every day | ORAL | 3 refills | Status: DC

## 2020-09-18 MED ORDER — DONEPEZIL HCL 10 MG PO TABS: 10.0000 mg | ORAL_TABLET | Freq: Every day | ORAL | 0 refills | Status: DC

## 2020-09-27 DIAGNOSIS — H6121 Impacted cerumen, right ear: Secondary | ICD-10-CM | POA: Diagnosis not present

## 2020-09-27 DIAGNOSIS — H903 Sensorineural hearing loss, bilateral: Secondary | ICD-10-CM | POA: Diagnosis not present

## 2020-10-03 ENCOUNTER — Other Ambulatory Visit: Payer: Self-pay | Admitting: Family Medicine

## 2020-11-19 ENCOUNTER — Other Ambulatory Visit: Payer: Self-pay | Admitting: Family Medicine

## 2020-12-03 DIAGNOSIS — H40052 Ocular hypertension, left eye: Secondary | ICD-10-CM | POA: Diagnosis not present

## 2020-12-12 ENCOUNTER — Other Ambulatory Visit: Payer: Self-pay | Admitting: Family Medicine

## 2020-12-12 DIAGNOSIS — R413 Other amnesia: Secondary | ICD-10-CM

## 2020-12-18 ENCOUNTER — Encounter: Payer: Self-pay | Admitting: Family Medicine

## 2020-12-18 ENCOUNTER — Ambulatory Visit (INDEPENDENT_AMBULATORY_CARE_PROVIDER_SITE_OTHER): Payer: Medicare Other | Admitting: Family Medicine

## 2020-12-18 ENCOUNTER — Other Ambulatory Visit: Payer: Self-pay

## 2020-12-18 VITALS — BP 131/57 | HR 52 | Temp 97.2°F | Resp 18 | Wt 156.2 lb

## 2020-12-18 DIAGNOSIS — F0390 Unspecified dementia without behavioral disturbance: Secondary | ICD-10-CM

## 2020-12-18 DIAGNOSIS — I209 Angina pectoris, unspecified: Secondary | ICD-10-CM

## 2020-12-18 MED ORDER — MEMANTINE HCL 10 MG PO TABS: ORAL_TABLET | ORAL | 2 refills | Status: DC

## 2020-12-18 NOTE — Patient Instructions (Addendum)
.   Start taking prescription namenda in addition to your other medication according to directions on label.

## 2020-12-18 NOTE — Progress Notes (Signed)
Established patient visit   Patient: Roger Cook   DOB: Dec 30, 1926   87 y.o. Male  MRN: [id number] Visit Date: 12/18/2020  Today's healthcare provider: Lelon Huh, MD   Chief Complaint  Patient presents with  . Hypertension  . Hypothyroidism  . Chest Pain   Subjective    HPI  Follow up for Angina pectoris:  The patient was last seen for this 4 months ago. Changes made at last visit include increasing Isosorbide mononitrate from 30mg  to 60mg  daily.  He reports good compliance with treatment. He feels that condition is Improved. He is not having side effects.   -----------------------------------------------------------------------------------------  Hypertension, follow-up  BP Readings from Last 3 Encounters:  12/18/20 (!) 131/57  09/18/20 129/66  08/20/20 (!) 170/66   Wt Readings from Last 3 Encounters:  12/18/20 156 lb 3.2 oz (70.9 kg)  09/18/20 155 lb (70.3 kg)  08/20/20 158 lb (71.7 kg)     He was last seen for hypertension 6 months ago.  BP at that visit was 156/71. Management since that visit includes ordering labs which showed elevated heart enzymes. Patient was started on Imdur and referred back to Cardiology.  He reports good compliance with treatment. He is not having side effects.  He is following a Regular diet. He is exercising. He does not smoke.  Use of agents associated with hypertension: NSAIDS.   Outside blood pressures are not checked. Symptoms: No chest pain No chest pressure  No palpitations No syncope  No dyspnea No orthopnea  No paroxysmal nocturnal dyspnea No lower extremity edema   Pertinent labs: Lab Results  Component Value Date   CHOL 146 06/09/2019   HDL 42 06/09/2019   LDLCALC 83 06/09/2019   TRIG 116 06/09/2019   CHOLHDL 3.5 06/09/2019   Lab Results  Component Value Date   NA 138 06/05/2020   K 4.4 06/05/2020   CREATININE 1.38 (H) 06/05/2020   GFRNONAA 44 (L) 06/05/2020   GFRAA 51 (L) 06/05/2020    GLUCOSE 119 (H) 06/05/2020     The ASCVD Risk score (Goff DC Jr., et al., 2013) failed to calculate for the following reasons:   The 2013 ASCVD risk score is only valid for ages 81 to 16   --------------------------------------------------------------------------------------------------- Hypothyroid, follow-up  Lab Results  Component Value Date   TSH 2.690 06/05/2020   TSH 2.920 06/09/2019   TSH 1.490 02/04/2018   T4TOTAL 6.4 02/05/2017   T4TOTAL 6.2 11/07/2016   Wt Readings from Last 3 Encounters:  12/18/20 156 lb 3.2 oz (70.9 kg)  09/18/20 155 lb (70.3 kg)  08/20/20 158 lb (71.7 kg)    He was last seen for hypothyroid 6 months ago.  Lab Results  Component Value Date   TSH 2.690 06/05/2020    Management since that visit includes continue same medications. He reports good compliance with treatment. He is not having side effects.   Symptoms: No change in energy level No constipation  No diarrhea No heat / cold intolerance  No nervousness No palpitations  No weight changes    -----------------------------------------------------------------------------------------  Follow up for Memory problem:  The patient was last seen for this 6 months ago. Changes made at last visit include starting another trial of Aricept.  He reports good compliance with treatment. He feels that condition is Improved.  He is also taking OTC Prevegen. Patient states his wife told to ask if there is something else he could take that would work better.  He is  not having side effects.   -----------------------------------------------------------------------------------------       Medications: Outpatient Medications Prior to Visit  Medication Sig  . Apoaequorin (PREVAGEN PO) Take by mouth daily.   . ARTIFICIAL TEAR SOLUTION OP Apply 1 drop to eye 2 (two) times daily.   Marland Kitchen aspirin 81 MG tablet Take 81 mg by mouth daily.   . Brimonidine Tartrate 0.025 % SOLN Apply to eye.  . calcium  carbonate (CALCIUM 600) 600 MG TABS tablet Take 600 mg by mouth 2 (two) times daily with a meal.  . donepezil (ARICEPT) 10 MG tablet TAKE ONE TABLET AT BEDTIME  . fluticasone (FLONASE) 50 MCG/ACT nasal spray PLACE 1-2 SPRAYS IN EACH NOSTRIL EVERY DAY AS NEEDED  . isosorbide mononitrate (IMDUR) 60 MG 24 hr tablet Take 1 tablet (60 mg total) by mouth daily.  Marland Kitchen levothyroxine (SYNTHROID) 88 MCG tablet TAKE 1 TABLET BY MOUTH DAILY  . loratadine (CLARITIN) 10 MG tablet Take 10 mg by mouth daily.  . magnesium hydroxide (MILK OF MAGNESIA) 400 MG/5ML suspension Take by mouth daily as needed for mild constipation.  . metoprolol succinate (TOPROL-XL) 25 MG 24 hr tablet TAKE HALF A TABLET BY MOUTH EVERY DAY REPLACES ATENOLOL  . MULTIPLE VITAMIN PO Take by mouth daily.   Marland Kitchen NIFEdipine (PROCARDIA XL/NIFEDICAL XL) 60 MG 24 hr tablet TAKE ONE TABLET EVERY DAY  . nitroGLYCERIN (NITROSTAT) 0.4 MG SL tablet Place 1 tablet (0.4 mg total) under the tongue every 5 (five) minutes as needed for chest pain (Up to 3 tablets).  . NON FORMULARY CPAP (Free Text) - Historical Medication  As directed  Started 22-Sep-1994 Active  . pravastatin (PRAVACHOL) 40 MG tablet TAKE 1 TABLET BY MOUTH DAILY  . senna-docusate (SENOKOT-S) 8.6-50 MG tablet Take 1 tablet at bedtime as needed by mouth. Take if no BM for more than two days  . [DISCONTINUED] brimonidine (ALPHAGAN) 0.2 % ophthalmic solution Place 1 drop into the left eye 2 (two) times daily.  . [DISCONTINUED] brimonidine-timolol (COMBIGAN) 0.2-0.5 % ophthalmic solution Place 1 drop into the left eye every 12 (twelve) hours.  . [DISCONTINUED] timolol (TIMOPTIC) 0.5 % ophthalmic solution Place 1 drop into the left eye 2 (two) times daily.  (Patient not taking: Reported on 12/18/2020)   No facility-administered medications prior to visit.    Review of Systems  Constitutional: Negative for appetite change, chills and fever.  Respiratory: Negative for chest tightness, shortness of  breath and wheezing.   Cardiovascular: Negative for chest pain and palpitations.  Gastrointestinal: Negative for abdominal pain, nausea and vomiting.       Objective    BP (!) 131/57 (BP Location: Left Arm, Patient Position: Sitting, Cuff Size: Normal)   Pulse (!) 52   Temp (!) 97.2 F (36.2 C) (Temporal)   Resp 18   Wt 156 lb 3.2 oz (70.9 kg)   BMI 24.46 kg/m     Physical Exam  General appearance: Well developed, well nourished male, cooperative and in no acute distress Head: Normocephalic, without obvious abnormality, atraumatic Respiratory: Respirations even and unlabored, normal respiratory rate Extremities: All extremities are intact.  Skin: Skin color, texture, turgor normal. No rashes seen  Psych: Appropriate mood and affect. Neurologic: Mental status: Alert, oriented to person, and place, thought content appropriate. Does not know day, date, month or year. Thinks the current season is fall.     Assessment & Plan     1. Dementia without behavioral disturbance, unspecified dementia type ([place]) Is not oriented to time.  Tolerating Aricept well. Will add - memantine (NAMENDA) 10 MG tablet; TAKE 1/2 TABLET DAILY FOR 14 DAYS, THEN INCREASE TO 1/2 TABLET TWICE DAILY FOR 14 DAYS THEN 1 FULL TABLET TWICE DAILY  Dispense: 60 tablet; Refill: 2  Follow up in 3 months.   2. Angina pectoris  Resolved with addition of isosorbide. Continue current medications.       The entirety of the information documented in the History of Present Illness, Review of Systems and Physical Exam were personally obtained by me. Portions of this information were initially documented by the CMA and reviewed by me for thoroughness and accuracy.      Lelon Huh, MD  [facility] [phone] (phone) [phone] (fax)  [place]

## 2021-01-04 DIAGNOSIS — Z23 Encounter for immunization: Secondary | ICD-10-CM | POA: Diagnosis not present

## 2021-01-24 ENCOUNTER — Ambulatory Visit: Payer: Medicare Other | Admitting: Urology

## 2021-01-24 ENCOUNTER — Encounter: Payer: Self-pay | Admitting: Urology

## 2021-01-24 ENCOUNTER — Ambulatory Visit (INDEPENDENT_AMBULATORY_CARE_PROVIDER_SITE_OTHER): Payer: Medicare Other | Admitting: Urology

## 2021-01-24 ENCOUNTER — Other Ambulatory Visit: Payer: Self-pay

## 2021-01-24 VITALS — BP 154/70 | HR 60 | Ht 66.0 in | Wt 152.0 lb

## 2021-01-24 DIAGNOSIS — I209 Angina pectoris, unspecified: Secondary | ICD-10-CM

## 2021-01-24 DIAGNOSIS — R310 Gross hematuria: Secondary | ICD-10-CM | POA: Diagnosis not present

## 2021-01-24 NOTE — Progress Notes (Signed)
01/24/2021 11:18 AM   Roger Cook [birth year] [id number]  Referring provider: [name], [place],   [address]  No chief complaint on file.   HPI: 87 y.o. male called for an appointment for recurrent gross hematuria.   Refer to my prior note 05/23/2020  High risk hematuria and CT urogram/cystoscopy was recommended  CT urogram performed 06/04/2020 remarkable for nonobstructing 3 mm left renal calculus, bilateral renal cysts and sequela of a previous perinephric hematoma  He was a no-show for his cystoscopy that was scheduled on 9/15 and rescheduling was recommended  Recurrent episode gross hematuria 01/22/2021  No dysuria or flank/abdominal/pelvic pain   PMH: Past Medical History:  Diagnosis Date  . Anemia   . Bell palsy   . Bladder tumor   . CAD (coronary artery disease)   . Cancer ([place])   . History of prostate cancer   . Hyperlipidemia   . Hypertension   . Lipoma   . MRSA bacteremia 02/2011  . Sleep apnea   . Thyroid disease    hypothyroidism    Surgical History: Past Surgical History:  Procedure Laterality Date  . APPENDECTOMY  2005  . EYE SURGERY  2005   cataract surgery, also had a macular hole in 2005  . EYE SURGERY  2008   catarac surgery  . HAND SURGERY Left 06/26/2011   Malignancy removed from left hand  . NASAL SEPTUM SURGERY  1986  . NASAL SINUS SURGERY  1990  . penile inplant  1984  . polyp of rectum  2003  . PROSTATE SURGERY  1975   Abdominal, had to have radiation treatment with the procedure  . PROSTATE SURGERY    . TONSILLECTOMY      Home Medications:  Allergies as of 01/24/2021      Reactions   Levofloxacin    lips swelling   Povidone Iodine    Sulfa Antibiotics    Benadryl [diphenhydramine] Rash      Medication List       Accurate as of Jan 24, 2021 11:18 AM. If you have any questions, ask your nurse or doctor.        ARTIFICIAL TEAR SOLUTION OP Apply 1 drop to eye 2 (two) times  daily.   aspirin 81 MG tablet Take 81 mg by mouth daily.   Brimonidine Tartrate 0.025 % Soln Apply to eye.   Calcium 600 600 MG Tabs tablet Generic drug: calcium carbonate Take 600 mg by mouth 2 (two) times daily with a meal.   donepezil 10 MG tablet Commonly known as: ARICEPT TAKE ONE TABLET AT BEDTIME   fluticasone 50 MCG/ACT nasal spray Commonly known as: FLONASE PLACE 1-2 SPRAYS IN EACH NOSTRIL EVERY DAY AS NEEDED   isosorbide mononitrate 60 MG 24 hr tablet Commonly known as: IMDUR Take 1 tablet (60 mg total) by mouth daily.   levothyroxine 88 MCG tablet Commonly known as: SYNTHROID TAKE 1 TABLET BY MOUTH DAILY   loratadine 10 MG tablet Commonly known as: CLARITIN Take 10 mg by mouth daily.   magnesium hydroxide 400 MG/5ML suspension Commonly known as: MILK OF MAGNESIA Take by mouth daily as needed for mild constipation.   memantine 10 MG tablet Commonly known as: Namenda TAKE 1/2 TABLET DAILY FOR 14 DAYS, THEN INCREASE TO 1/2 TABLET TWICE DAILY FOR 14 DAYS THEN 1 FULL TABLET TWICE DAILY   metoprolol succinate 25 MG 24 hr tablet Commonly known as: TOPROL-XL TAKE HALF A TABLET BY  MOUTH EVERY DAY REPLACES ATENOLOL   MULTIPLE VITAMIN PO Take by mouth daily.   NIFEdipine 60 MG 24 hr tablet Commonly known as: PROCARDIA XL/NIFEDICAL XL TAKE ONE TABLET EVERY DAY   nitroGLYCERIN 0.4 MG SL tablet Commonly known as: NITROSTAT Place 1 tablet (0.4 mg total) under the tongue every 5 (five) minutes as needed for chest pain (Up to 3 tablets).   NON FORMULARY CPAP (Free Text) - Historical Medication  As directed  Started 22-Sep-1994 Active   pravastatin 40 MG tablet Commonly known as: PRAVACHOL TAKE 1 TABLET BY MOUTH DAILY   PREVAGEN PO Take by mouth daily.   senna-docusate 8.6-50 MG tablet Commonly known as: Senokot-S Take 1 tablet at bedtime as needed by mouth. Take if no BM for more than two days       Allergies:  Allergies  Allergen Reactions  .  Levofloxacin     lips swelling  . Povidone Iodine   . Sulfa Antibiotics   . Benadryl [Diphenhydramine] Rash    Family History: Family History  Problem Relation Age of Onset  . Lung cancer Mother   . Heart disease Father     Social History:  reports that he has quit smoking. His smoking use included cigarettes. He has never used smokeless tobacco. He reports current alcohol use of about 2.0 standard drinks of alcohol per week. He reports that he does not use drugs.   Physical Exam: There were no vitals taken for this visit.  Constitutional:  Alert and oriented, No acute distress. HEENT: [place] AT, moist mucus membranes.  Trachea midline, no masses. Cardiovascular: No clubbing, cyanosis, or edema. Respiratory: Normal respiratory effort, no increased work of breathing. Skin: No rashes, bruises or suspicious lesions. Neurologic: Grossly intact, no focal deficits, moving all 4 extremities. Psychiatric: Normal mood and affect.  Laboratory Data:  Urinalysis Dipstick 1+ ketones/3+ blood/3+ protein Microscopy >30 RBC   Assessment & Plan:    1.  Recurrent gross hematuria  Recent CTU unremarkable  I again reviewed the recommended evaluation for high risk hematuria that includes cystoscopy for lower tract evaluation.  I reviewed the procedure and recommended scheduling.   Abbie Sons, [place] [address], [place],  [address] [phone]

## 2021-01-25 ENCOUNTER — Ambulatory Visit: Payer: Medicare Other | Admitting: Urology

## 2021-01-25 LAB — URINALYSIS, COMPLETE
Bilirubin, UA: NEGATIVE
Glucose, UA: NEGATIVE
Specific Gravity, UA: 1.015 (ref 1.005–1.030)
pH, UA: 7 (ref 5.0–7.5)

## 2021-01-25 LAB — MICROSCOPIC EXAMINATION
Bacteria, UA: NONE SEEN
RBC, Urine: >30 /hpf — AB (ref 0–2)

## 2021-02-01 LAB — CULTURE, URINE COMPREHENSIVE

## 2021-02-06 ENCOUNTER — Encounter: Payer: Self-pay | Admitting: Urology

## 2021-02-06 ENCOUNTER — Ambulatory Visit (INDEPENDENT_AMBULATORY_CARE_PROVIDER_SITE_OTHER): Payer: Medicare Other | Admitting: Urology

## 2021-02-06 ENCOUNTER — Other Ambulatory Visit: Payer: Self-pay

## 2021-02-06 VITALS — BP 162/64 | HR 73 | Ht 67.0 in | Wt 140.0 lb

## 2021-02-06 DIAGNOSIS — R310 Gross hematuria: Secondary | ICD-10-CM | POA: Diagnosis not present

## 2021-02-06 DIAGNOSIS — D494 Neoplasm of unspecified behavior of bladder: Secondary | ICD-10-CM

## 2021-02-06 NOTE — Progress Notes (Signed)
   02/06/21  CC:  Chief Complaint  Patient presents with  . Cysto    HPI: 87 y.o. male with recurrent gross hematuria.  No significant abnormality CT urogram.  Last episode 2 days ago.  History of radical prostatectomy 1980s and radiation 5-6 weeks postop  Blood pressure (!) 162/64, pulse 73, height 5\' 7"  (1.702 m), weight 140 lb (63.5 kg). NED. A&Ox3.   No respiratory distress   Abd soft, NT, ND Normal phallus with bilateral descended testicles  Cystoscopy Procedure Note  Patient identification was confirmed, informed consent was obtained, and patient was prepped using Betadine solution.  Lidocaine jelly was administered per urethral meatus.     Pre-Procedure: - Inspection reveals a normal caliber urethral meatus.  Procedure: The flexible cystoscope was introduced without difficulty - No urethral strictures/lesions are present. - Surgically absent prostate  - Normal bladder neck - Bilateral ureteral orifices identified - Bladder mucosa  reveals abnormal appearing tissue left lateral wall with    papillary change and fibrinous exudate - No bladder stones -Mild trabeculation  Retroflexion shows no abnormalities   Post-Procedure: - Patient tolerated the procedure well  Assessment/ Plan:  1.  Bladder tumor  Findings were discussed in detail including possible etiologies of radiation cystitis changes and urothelial carcinoma the bladder  Recommended scheduling TURBT  The procedure was discussed in detail including potential risks of bleeding, infection and bladder injury/perforation.  If this is a urothelial carcinoma and will most likely be high-grade and will not plan on post resection gemcitabine  All questions were answered and he desires to schedule   Abbie Sons, MD

## 2021-02-08 LAB — URINALYSIS, COMPLETE
Bilirubin, UA: NEGATIVE
Nitrite, UA: NEGATIVE
Specific Gravity, UA: 1.02 (ref 1.005–1.030)
Urobilinogen, Ur: 0.2 mg/dL (ref 0.2–1.0)
pH, UA: 8.5 — ABNORMAL HIGH (ref 5.0–7.5)

## 2021-02-08 LAB — MICROSCOPIC EXAMINATION: RBC, Urine: >30 /hpf — ABNORMAL HIGH (ref 0–2)

## 2021-02-13 ENCOUNTER — Telehealth: Payer: Self-pay

## 2021-02-13 NOTE — Telephone Encounter (Signed)
Copied from [place] [id number]. Topic: General - Other >> Feb 13, 2021  9:13 AM Leward Quan A wrote: Reason for CRM: Patient daughter Lucretia Roers called in to inform of Dr Caryn Section that patient is having some memory and the family is concerned that he is still having blood in his urine and need to see Dr Caryn Section earlier than July, so needing a nurse to reach out to him for Dr Caryn Section to check him out. (  Also she would like for it to be kept from patient that she have called in to speak to office on his behalf. Ms Angela Adam lives in [place] and is concerned with patients health as he is very private with his life events) Patient or wife can be reached at    Ph# [phone]

## 2021-02-13 NOTE — Telephone Encounter (Signed)
They need to call his urologist regarding blood in urine, but Anderson Malta is not on the DPR, so they will need to have the other daughter who is on DPR call. We can refer to neurology regarding worsening memory.

## 2021-02-13 NOTE — Telephone Encounter (Signed)
Please advise whether you would like to see patient sooner than July. Patient was just seen by urology (Dr. Bernardo Heater)  for gross hematuria on 01/24/2021 and on 02/06/2021. Patient's daughter is not listed on DPR.

## 2021-02-15 NOTE — Telephone Encounter (Signed)
I called patient's other daughter Earney [place] who is listed on the [facility] and advised her as below. Mardene Celeste verbalized understanding and wants to hold off on a neurology referral right now.

## 2021-02-25 ENCOUNTER — Emergency Department
Admission: EM | Admit: 2021-02-25 | Discharge: 2021-02-25 | Disposition: A | Discharge status: Home / Self Care | Payer: Medicare Other | Attending: Emergency Medicine | Admitting: Emergency Medicine

## 2021-02-25 ENCOUNTER — Encounter: Payer: Self-pay | Admitting: Emergency Medicine

## 2021-02-25 ENCOUNTER — Other Ambulatory Visit: Payer: Self-pay

## 2021-02-25 DIAGNOSIS — Z8546 Personal history of malignant neoplasm of prostate: Secondary | ICD-10-CM | POA: Insufficient documentation

## 2021-02-25 DIAGNOSIS — E039 Hypothyroidism, unspecified: Secondary | ICD-10-CM | POA: Diagnosis not present

## 2021-02-25 DIAGNOSIS — R319 Hematuria, unspecified: Secondary | ICD-10-CM | POA: Diagnosis not present

## 2021-02-25 DIAGNOSIS — Z7982 Long term (current) use of aspirin: Secondary | ICD-10-CM | POA: Insufficient documentation

## 2021-02-25 DIAGNOSIS — I2510 Atherosclerotic heart disease of native coronary artery without angina pectoris: Secondary | ICD-10-CM | POA: Insufficient documentation

## 2021-02-25 DIAGNOSIS — Z87891 Personal history of nicotine dependence: Secondary | ICD-10-CM | POA: Insufficient documentation

## 2021-02-25 DIAGNOSIS — I119 Hypertensive heart disease without heart failure: Secondary | ICD-10-CM | POA: Insufficient documentation

## 2021-02-25 DIAGNOSIS — Z79899 Other long term (current) drug therapy: Secondary | ICD-10-CM | POA: Diagnosis not present

## 2021-02-25 LAB — URINALYSIS, COMPLETE (UACMP) WITH MICROSCOPIC
Bacteria, UA: NONE SEEN
RBC / HPF: >50 RBC/hpf — ABNORMAL HIGH (ref 0–5)
Specific Gravity, Urine: 1.025 (ref 1.005–1.030)
Squamous Epithelial / HPF: NONE SEEN (ref 0–5)
WBC, UA: >50 WBC/hpf — ABNORMAL HIGH (ref 0–5)

## 2021-02-25 LAB — BASIC METABOLIC PANEL
Anion gap: 7 (ref 5–15)
BUN: 31 mg/dL — ABNORMAL HIGH (ref 8–23)
CO2: 23 mmol/L (ref 22–32)
Calcium: 8.7 mg/dL — ABNORMAL LOW (ref 8.9–10.3)
Chloride: 109 mmol/L (ref 98–111)
Creatinine, Ser: 1.5 mg/dL — ABNORMAL HIGH (ref 0.61–1.24)
GFR, Estimated: 44 mL/min — ABNORMAL LOW (ref >60)
Glucose, Bld: 100 mg/dL — ABNORMAL HIGH (ref 70–99)
Potassium: 4.5 mmol/L (ref 3.5–5.1)
Sodium: 139 mmol/L (ref 135–145)

## 2021-02-25 LAB — SAMPLE TO BLOOD BANK

## 2021-02-25 LAB — CBC
HCT: 23.5 % — ABNORMAL LOW (ref 39.0–52.0)
Hemoglobin: 7.4 g/dL — ABNORMAL LOW (ref 13.0–17.0)
MCH: 25.3 pg — ABNORMAL LOW (ref 26.0–34.0)
MCHC: 31.5 g/dL (ref 30.0–36.0)
MCV: 80.2 fL (ref 80.0–100.0)
Platelets: 310 10*3/uL (ref 150–400)
RBC: 2.93 MIL/uL — ABNORMAL LOW (ref 4.22–5.81)
RDW: 16.2 % — ABNORMAL HIGH (ref 11.5–15.5)
WBC: 7 10*3/uL (ref 4.0–10.5)
nRBC: 0 % (ref 0.0–0.2)

## 2021-02-25 MED ORDER — CEPHALEXIN 500 MG PO CAPS: 500.0000 mg | ORAL_CAPSULE | Freq: Two times a day (BID) | ORAL | 0 refills | Status: DC

## 2021-02-25 NOTE — ED Notes (Signed)
This NT did a bladder scan. 45 ml was scanned

## 2021-02-25 NOTE — ED Notes (Signed)
Pt calm , collective , denied pain or sob  

## 2021-02-25 NOTE — ED Triage Notes (Signed)
Pt to ED via POV with c/o hematuria since yesterday, pt denies urinary symptoms at this time, pt states has happened before.

## 2021-02-25 NOTE — ED Notes (Signed)
ED Provider at bedside. 

## 2021-02-25 NOTE — ED Provider Notes (Signed)
[facility] Emergency Department Provider Note  ____________________________________________   Event Date/Time   First MD Initiated Contact with Patient 02/25/21 1248     (approximate)  I have reviewed the triage vital signs and the nursing notes.   HISTORY  Chief Complaint Hematuria    HPI Roger Cook is a 87 y.o. male with hypertension, hyperlipidemia, prostate cancer status post prostate removal and radiation therapy who comes in with hematuria.  Patient reports having hematuria that started last night, constant with urination, nothing makes better, nothing makes it worse.  He reports a history of having this previously being seen by urology and he supposed to have a procedure in 2 weeks please not sure what for.  Denies any falls.  Denies any pain in his abdomen, back.  He denies any shortness of breath, dizziness, lightheadedness or any other concerns. No cardiac problems in past.   On review of records patient is seen by Dr. Bernardo Heater.  He had a cystoscopy done on 5/18 with concerns for possible post radiation versus bladder tumor and is scheduled for removal on 6/21.            Past Medical History:  Diagnosis Date  . Anemia   . Bell palsy   . Bladder tumor   . CAD (coronary artery disease)   . Cancer ([place])   . History of prostate cancer   . Hyperlipidemia   . Hypertension   . Lipoma   . MRSA bacteremia 02/2011  . Sleep apnea   . Thyroid disease    hypothyroidism    Patient Active Problem List   Diagnosis Date Noted  . Angina pectoris ([place]) 09/18/2020  . Memory problem 09/18/2020  . Renal hematoma, right, subsequent encounter 04/25/2018  . Localized osteoporosis of spine 12/23/2017  . Constipation 11/06/2016  . Bell's palsy 02/26/2015  . Nephrogenic adenoma of bladder 02/26/2015  . Folliculitis 81/82/9937  . Personal history of methicillin resistant Staphylococcus aureus 02/26/2015  . Fatty tumor 02/26/2015  . Syncope and  collapse 02/26/2015  . Fungal infection of nail 02/26/2015  . H/O malignant neoplasm of prostate 02/26/2015  . Altered blood in stool 10/18/2012  . Arteriosclerosis of coronary artery 01/05/2009  . Severe obstructive sleep apnea 01/05/2009  . Hypothyroidism 01/05/2009  . Essential hypertension 09/22/1998    Past Surgical History:  Procedure Laterality Date  . APPENDECTOMY  2005  . EYE SURGERY  2005   cataract surgery, also had a macular hole in 2005  . EYE SURGERY  2008   catarac surgery  . HAND SURGERY Left 06/26/2011   Malignancy removed from left hand  . NASAL SEPTUM SURGERY  1986  . NASAL SINUS SURGERY  1990  . penile inplant  1984  . polyp of rectum  2003  . PROSTATE SURGERY  1975   Abdominal, had to have radiation treatment with the procedure  . PROSTATE SURGERY    . TONSILLECTOMY      Prior to Admission medications   Medication Sig Start Date End Date Taking? Authorizing Provider  ARTIFICIAL TEAR SOLUTION OP Apply 1 drop to eye 2 (two) times daily.     [provider]  aspirin 81 MG tablet Take 81 mg by mouth daily.  01/05/09   [provider]  brimonidine (ALPHAGAN) 0.2 % ophthalmic solution Place 1 drop into the left eye 2 (two) times daily. 01/25/21   [provider]  donepezil (ARICEPT) 10 MG tablet TAKE ONE TABLET AT BEDTIME 12/12/20  [name], MD  fluticasone [facility]) 50 MCG/ACT nasal spray PLACE 1-2 SPRAYS IN [facility] NOSTRIL EVERY DAY AS NEEDED 10/03/20   [name], MD  levothyroxine (SYNTHROID) 88 MCG tablet TAKE 1 TABLET BY MOUTH DAILY 05/07/20   [name], MD  metoprolol succinate (TOPROL-XL) 25 MG 24 hr tablet Take 0.5 tablets by mouth daily. 01/25/21   [provider]  MULTIPLE VITAMIN PO Take by mouth daily.     [provider]  NIFEdipine (PROCARDIA XL/NIFEDICAL XL) 60 MG 24 hr tablet TAKE ONE TABLET EVERY DAY 04/09/20   [name], MD  NON FORMULARY CPAP (Free Text) - Historical Medication  As  directed  Started 22-Sep-1994 Active 09/22/1994   [provider]  pravastatin (PRAVACHOL) 40 MG tablet TAKE 1 TABLET BY MOUTH DAILY 11/19/20   [name], MD    Allergies Levofloxacin, Povidone iodine, Sulfa antibiotics, and Benadryl [diphenhydramine]  Family History  Problem Relation Age of Onset  . Lung cancer Mother   . Heart disease Father     Social History Social History   Tobacco Use  . Smoking status: Former Smoker    Types: Cigarettes  . Smokeless tobacco: Never Used  . Tobacco comment: quit in 1963  Vaping Use  . Vaping Use: Never used  Substance Use Topics  . Alcohol use: Yes    Alcohol/week: 2.0 standard drinks    Types: 2 Glasses of wine per week  . Drug use: No      Review of Systems Constitutional: No fever/chills Eyes: No visual changes. ENT: No sore throat. Cardiovascular: Denies chest pain. Respiratory: Denies shortness of breath. Gastrointestinal: No abdominal pain.  No nausea, no vomiting.  No diarrhea.  No constipation. Genitourinary: Negative for dysuria.  Positive hematuria Musculoskeletal: Negative for back pain. Skin: Negative for rash. Neurological: Negative for headaches, focal weakness or numbness. All other ROS negative ____________________________________________   PHYSICAL EXAM:  VITAL SIGNS: ED Triage Vitals  Enc Vitals Group     BP 02/25/21 1200 (!) 138/49     Pulse Rate 02/25/21 1200 (!) 59     Resp 02/25/21 1200 17     Temp 02/25/21 1200 (!) 97.5 F (36.4 C)     Temp Source 02/25/21 1200 Oral     SpO2 02/25/21 1200 99 %     Weight 02/25/21 1204 140 lb (63.5 kg)     Height 02/25/21 1204 5\' 7"  (1.702 m)     Head Circumference --      Peak Flow --      Pain Score 02/25/21 1204 0     Pain Loc --      Pain Edu? --      Excl. in [place]? --     Constitutional: Alert and oriented. Well appearing and in no acute distress. Eyes: Conjunctivae are normal. EOMI. Head: Atraumatic. Nose: No  congestion/rhinnorhea. Mouth/Throat: Mucous membranes are moist.   Neck: No stridor. Trachea Midline. FROM Cardiovascular: Normal rate, regular rhythm. Grossly normal heart sounds.  Good peripheral circulation. Respiratory: Normal respiratory effort.  No retractions. Lungs CTAB. Gastrointestinal: Soft and nontender. No distention. No abdominal bruits.  Musculoskeletal: No lower extremity tenderness nor edema.  No joint effusions. Neurologic:  Normal speech and language. No gross focal neurologic deficits are appreciated.  Skin:  Skin is warm, dry and intact. No rash noted. Psychiatric: Mood and affect are normal. Speech and behavior are normal. GU: Patient has his penis wrapped with tissues which she states is secondary  to urethral incontinence, there is some light stained blood noted on the tissues.  He has no urethral laceration.  ____________________________________________   LABS (all labs ordered are listed, but only abnormal results are displayed)  Labs Reviewed  URINALYSIS, COMPLETE (UACMP) WITH MICROSCOPIC - Abnormal; Notable for the following components:      Result Value   Color, Urine RED (*)    APPearance TURBID (*)    Glucose, UA   (*)    Value: TEST NOT REPORTED DUE TO COLOR INTERFERENCE OF URINE PIGMENT   Hgb urine dipstick   (*)    Value: TEST NOT REPORTED DUE TO COLOR INTERFERENCE OF URINE PIGMENT   Bilirubin Urine   (*)    Value: TEST NOT REPORTED DUE TO COLOR INTERFERENCE OF URINE PIGMENT   Ketones, ur   (*)    Value: TEST NOT REPORTED DUE TO COLOR INTERFERENCE OF URINE PIGMENT   Protein, ur   (*)    Value: TEST NOT REPORTED DUE TO COLOR INTERFERENCE OF URINE PIGMENT   Nitrite   (*)    Value: TEST NOT REPORTED DUE TO COLOR INTERFERENCE OF URINE PIGMENT   Leukocytes,Ua   (*)    Value: TEST NOT REPORTED DUE TO COLOR INTERFERENCE OF URINE PIGMENT   RBC / HPF >50 (*)    WBC, UA >50 (*)    All other components within normal limits  BASIC METABOLIC PANEL -  Abnormal; Notable for the following components:   Glucose, Bld 100 (*)    BUN 31 (*)    Creatinine, Ser 1.50 (*)    Calcium 8.7 (*)    GFR, Estimated 44 (*)    All other components within normal limits  CBC - Abnormal; Notable for the following components:   RBC 2.93 (*)    Hemoglobin 7.4 (*)    HCT 23.5 (*)    MCH 25.3 (*)    RDW 16.2 (*)    All other components within normal limits  URINE CULTURE  SAMPLE TO BLOOD BANK   ____________________________________________      PROCEDURES  Procedure(s) performed (including Critical Care):  Procedures   ____________________________________________   INITIAL IMPRESSION / ASSESSMENT AND PLAN / ED COURSE  Roger Cook was evaluated in Emergency Department on 02/25/2021 for the symptoms described in the history of present illness. He was evaluated in the context of the global COVID-19 pandemic, which necessitated consideration that the patient might be at risk for infection with the SARS-CoV-2 virus that causes COVID-19. Institutional protocols and algorithms that pertain to the evaluation of patients at risk for COVID-19 are in a state of rapid change based on information released by regulatory bodies including the CDC and federal and state organizations. These policies and algorithms were followed during the patient's care in the ED.    Patient is a 87 year old who comes in with hematuria.  I was able to see his urine and is blood tinged but not grossly bloody..  No clots are noted.  Will get postvoid residual to make sure no signs of retention.  Will get labs to evaluate for anemia.  I suspect that this is from his known mass.  His urine does have some white cells in it but most likely this is just sloughing from the mass but will send for urine culture and start patient on Keflex just to be safe.  Hemoglobin is 7.4 but 8 months ago it was 8.4.  He does not have any other recent checks prior to  3 years ago.  He denies any symptoms of  anemia to suggest needing a blood transfusion at this time and again the urine is only blood-tinged and more like pink color then gross redness.  I discussed the case with Dr. Erlene Quan from urology and they will build to get him in for a recheck of his hemoglobin in 2 to 3 days and to check to make sure he does not have develop any retention.  I discussed with patient his low hemoglobin and different options including admission to trend out hemoglobins versus going home and having his hemoglobin checked outpatient.  Patient feels comfortable going home and he understands return precautions including lightheadedness, weakness, worsening bleeding or not been able to urinate.  He understands if any of this is to happen he should return to the ER immediately  I discussed the provisional nature of ED diagnosis, the treatment so far, the ongoing plan of care, follow up appointments and return precautions with the patient and any family or support people present. They expressed understanding and agreed with the plan, discharged home.           ____________________________________________   FINAL CLINICAL IMPRESSION(S) / ED DIAGNOSES   Final diagnoses:  Hematuria, unspecified type      MEDICATIONS GIVEN DURING THIS VISIT:  Medications - No data to display   ED Discharge Orders         Ordered    cephALEXin (KEFLEX) 500 MG capsule  2 times daily        02/25/21 1507           Note:  This document was prepared using Dragon voice recognition software and may include unintentional dictation errors.   Vanessa Lomira, MD 02/25/21 [phone]

## 2021-02-25 NOTE — Discharge Instructions (Addendum)
This is most likely secondary to your known bladder mass that will cause some intermittent bleeding.  However your hemoglobin is on the borderline of low and needing a blood transfusion at 7.4.  If it drops below 7 you need a blood transfusion.  Therefore if you develop worsening bleeding lots of dark blood/clots, weakness, fatigue,  need to return to the ER immediately for hemoglobin recheck.  Otherwise he can follow-up with the urology team in 2 to 3 days for hemoglobin recheck.  Also if you are not able to urinate you need to return immediately. We also started an antibiotic incase there is a UTI.

## 2021-02-27 NOTE — H&P (View-Only) (Signed)
02/28/2021 3:28 PM   Roger Cook 1927-07-03 [id number]  Referring provider: [name], [place],  [place] [address]  Chief Complaint  Patient presents with   Bladder tumor   Urological history: 1. Prostate cancer -PSA <0.1 in 2018 -radical prostatectomy 1980's   2. ED -IPP   3. Bilateral renal cysts -Appreciated on CT urogram - 05/2020  4. Right renal calculus  -3 mm right renal calculus - 05/2020  5. High risk hematuria -former smoker -CTU 05/2020 renal calculus and renal cysts  -cysto 01/2021 Bladder mucosa  reveals abnormal appearing tissue left lateral wall with papillary change and fibrinous exudate -scheduled for a TURBT 03/12/2021 -UA > 30 RBC's    HPI: Roger Cook is a 87 y.o. male who presents today for a follow up after being seen in the ED for gross hematuria.    He was seen in the ED on February 25, 2021 for sudden onset of gross hematuria that started the night before.  His hemoglobin was found to be 7.4 hematocrit was found to be 23.5 which is lower from his results 8 months ago where hemoglobin is 8.4 hematocrit was 27.6.  UA in the ED was greater than 50 RBCs and greater than 50 WBCs.  He states that the hematuria stopped on Tuesday.  He has no other urinary complaints at this time.  Patient denies any modifying or aggravating factors.  Patient denies any dysuria or suprapubic/flank pain.  Patient denies any fevers, chills, nausea or vomiting.    UA greater than 30 WBCs and greater than 30 RBCs and few bacteria.  Urine culture was negative in the emergency room.  PVR is 0 mL.   PMH: Past Medical History:  Diagnosis Date   Anemia    Bell palsy    Bladder tumor    CAD (coronary artery disease)    Cancer ([place])    History of prostate cancer    Hyperlipidemia    Hypertension    Lipoma    MRSA bacteremia 02/2011   Sleep apnea    Thyroid disease    hypothyroidism    Surgical History: Past Surgical  History:  Procedure Laterality Date   APPENDECTOMY  2005   EYE SURGERY  2005   cataract surgery, also had a macular hole in 2005   EYE SURGERY  2008   catarac surgery   HAND SURGERY Left 06/26/2011   Malignancy removed from left hand   [place]   penile inplant  1984   polyp of rectum  2003   [place]   Abdominal, had to have radiation treatment with the procedure   PROSTATE SURGERY     TONSILLECTOMY      Home Medications:  Allergies as of 02/28/2021       Reactions   Levofloxacin Swelling   lips swelling   Povidone Iodine    Unknown, can not remember   Sulfa Antibiotics    Unknown, can not remember   Benadryl [diphenhydramine] Rash        Medication List        Accurate as of February 28, 2021  3:28 PM. If you have any questions, ask your nurse or doctor.          STOP taking these medications    cephALEXin 500 MG capsule Commonly known as: KEFLEX Stopped by: Zara Council, PA-C  TAKE these medications    ARTIFICIAL TEAR SOLUTION OP Place 1 drop into both eyes 2 (two) times daily as needed (dry eyes).   aspirin 81 MG tablet Take 81 mg by mouth daily.   brimonidine 0.2 % ophthalmic solution Commonly known as: ALPHAGAN Place 1 drop into the left eye 2 (two) times daily.   donepezil 10 MG tablet Commonly known as: ARICEPT TAKE ONE TABLET AT BEDTIME   fluticasone 50 MCG/ACT nasal spray Commonly known as: FLONASE PLACE 1-2 SPRAYS IN EACH NOSTRIL EVERY DAY AS NEEDED What changed: See the new instructions.   levothyroxine 88 MCG tablet Commonly known as: SYNTHROID TAKE 1 TABLET BY MOUTH DAILY What changed: when to take this   metoprolol succinate 25 MG 24 hr tablet Commonly known as: TOPROL-XL Take 12.5 mg by mouth daily.   MULTIPLE VITAMIN PO Take 1 tablet by mouth daily.   NIFEdipine 60 MG 24 hr tablet Commonly known as: PROCARDIA XL/NIFEDICAL XL TAKE ONE TABLET EVERY DAY    NON FORMULARY CPAP (Free Text) - Historical Medication  As directed  Started 22-Sep-1994 Active   pravastatin 40 MG tablet Commonly known as: PRAVACHOL TAKE 1 TABLET BY MOUTH DAILY   PreviDent 5000 Dry Mouth 1.1 % Gel dental gel Generic drug: sodium fluoride Place 1 application onto teeth 2 (two) times daily.        Allergies:  Allergies  Allergen Reactions   Levofloxacin Swelling    lips swelling   Povidone Iodine     Unknown, can not remember   Sulfa Antibiotics     Unknown, can not remember   Benadryl [Diphenhydramine] Rash    Family History: Family History  Problem Relation Age of Onset   Lung cancer Mother    Heart disease Father     Social History:  reports that he has quit smoking. His smoking use included cigarettes. He has never used smokeless tobacco. He reports current alcohol use of about 2.0 standard drinks of alcohol per week. He reports that he does not use drugs.  ROS: Pertinent ROS in HPI  Physical Exam: BP (!) 153/54   Pulse 76   Ht 5\' 7"  (1.702 m)   Wt 140 lb (63.5 kg)   BMI 21.93 kg/m   Constitutional:  Well nourished. Alert and oriented, No acute distress. HEENT: [place] AT, mask in place.  Trachea midline Cardiovascular: No clubbing, cyanosis, or edema. Respiratory: Normal respiratory effort, no increased work of breathing. Neurologic: Grossly intact, no focal deficits, moving all 4 extremities. Psychiatric: Normal mood and affect.  Laboratory Data: Lab Results  Component Value Date   WBC 7.0 02/25/2021   HGB 7.4 (L) 02/25/2021   HCT 23.5 (L) 02/25/2021   MCV 80.2 02/25/2021   PLT 310 02/25/2021    Lab Results  Component Value Date   CREATININE 1.50 (H) 02/25/2021    Lab Results  Component Value Date   PSA 0.1 08/30/2014    Lab Results  Component Value Date   TSH 2.690 06/05/2020    Lab Results  Component Value Date   AST 21 06/05/2020   Lab Results  Component Value Date   ALT 12 06/05/2020     Urinalysis Component     Latest Ref Rng & Units 02/28/2021  Specific Gravity, UA     1.005 - 1.030 1.025  pH, UA     5.0 - 7.5 6.5  Color, UA     Yellow Yellow  Appearance Ur     Clear Cloudy (A)  Leukocytes,UA     Negative 1+ (A)  Protein,UA     Negative/Trace 3+ (A)  Glucose, UA     Negative Negative  Ketones, UA     Negative 1+ (A)  RBC, UA     Negative 3+ (A)  Bilirubin, UA     Negative Negative  Urobilinogen, Ur     0.2 - 1.0 mg/dL 0.2  Nitrite, UA     Negative Negative  Microscopic Examination      See below:   Component     Latest Ref Rng & Units 02/28/2021  WBC, UA     0 - 5 /hpf >30 (H)  RBC     0 - 2 /hpf >30 (H)  Epithelial Cells (non renal)     0 - 10 /hpf 0-10  Bacteria, UA     None seen/Few Few (A)    I have reviewed the labs.   Pertinent Imaging: Results for Funderburk, ADDISON WHIDBEE "ROCCO KERKHOFF" (MRN [id number]) as of 02/28/2021 15:26  Ref. Range 02/28/2021 15:07  Scan Result Unknown 0   Assessment & Plan:   1.  Gross hematuria -resolved  -Hematuria work-up completed and is scheduled for TURBT on March 12, 2021  2.  Anemia -Repeat hemoglobin hematocrit are pending at this time -Hopefully, it is stabilized as he no longer has hematuria   Return for Keep follow-up appointments with pre-admit and TURBT .  These notes generated with voice recognition software. I apologize for typographical errors.  Zara Council, PA-C  [facility] [address]  [place],  [address] [phone]

## 2021-02-27 NOTE — Progress Notes (Signed)
02/28/2021 3:28 PM   Roger Cook 1926-10-02 [id number]  Referring provider: [name], [address] [place],  [place] [address]  Chief Complaint  Patient presents with   Bladder tumor   Urological history: 1. Prostate cancer -PSA <0.1 in 2018 -radical prostatectomy 1980's   2. ED -IPP   3. Bilateral renal cysts -Appreciated on CT urogram - 05/2020  4. Right renal calculus  -3 mm right renal calculus - 05/2020  5. High risk hematuria -former smoker -CTU 05/2020 renal calculus and renal cysts  -cysto 01/2021 Bladder mucosa  reveals abnormal appearing tissue left lateral wall with papillary change and fibrinous exudate -scheduled for a TURBT 03/12/2021 -UA > 30 RBC's    HPI: Roger Cook is a 87 y.o. male who presents today for a follow up after being seen in the ED for gross hematuria.    He was seen in the ED on February 25, 2021 for sudden onset of gross hematuria that started the night before.  His hemoglobin was found to be 7.4 hematocrit was found to be 23.5 which is lower from his results 8 months ago where hemoglobin is 8.4 hematocrit was 27.6.  UA in the ED was greater than 50 RBCs and greater than 50 WBCs.  He states that the hematuria stopped on Tuesday.  He has no other urinary complaints at this time.  Patient denies any modifying or aggravating factors.  Patient denies any dysuria or suprapubic/flank pain.  Patient denies any fevers, chills, nausea or vomiting.    UA greater than 30 WBCs and greater than 30 RBCs and few bacteria.  Urine culture was negative in the emergency room.  PVR is 0 mL.   PMH: Past Medical History:  Diagnosis Date   Anemia    Bell palsy    Bladder tumor    CAD (coronary artery disease)    Cancer ([place])    History of prostate cancer    Hyperlipidemia    Hypertension    Lipoma    MRSA bacteremia 02/2011   Sleep apnea    Thyroid disease    hypothyroidism    Surgical History: Past Surgical  History:  Procedure Laterality Date   APPENDECTOMY  2005   EYE SURGERY  2005   cataract surgery, also had a macular hole in 2005   EYE SURGERY  2008   catarac surgery   HAND SURGERY Left 06/26/2011   Malignancy removed from left hand   [place]   penile inplant  1984   polyp of rectum  2003   [place]   Abdominal, had to have radiation treatment with the procedure   PROSTATE SURGERY     TONSILLECTOMY      Home Medications:  Allergies as of 02/28/2021       Reactions   Levofloxacin Swelling   lips swelling   Povidone Iodine    Unknown, can not remember   Sulfa Antibiotics    Unknown, can not remember   Benadryl [diphenhydramine] Rash        Medication List        Accurate as of February 28, 2021  3:28 PM. If you have any questions, ask your nurse or doctor.          STOP taking these medications    cephALEXin 500 MG capsule Commonly known as: KEFLEX Stopped by: Zara Council, PA-C  TAKE these medications    ARTIFICIAL TEAR SOLUTION OP Place 1 drop into both eyes 2 (two) times daily as needed (dry eyes).   aspirin 81 MG tablet Take 81 mg by mouth daily.   brimonidine 0.2 % ophthalmic solution Commonly known as: ALPHAGAN Place 1 drop into the left eye 2 (two) times daily.   donepezil 10 MG tablet Commonly known as: ARICEPT TAKE ONE TABLET AT BEDTIME   fluticasone 50 MCG/ACT nasal spray Commonly known as: FLONASE PLACE 1-2 SPRAYS IN EACH NOSTRIL EVERY DAY AS NEEDED What changed: See the new instructions.   levothyroxine 88 MCG tablet Commonly known as: SYNTHROID TAKE 1 TABLET BY MOUTH DAILY What changed: when to take this   metoprolol succinate 25 MG 24 hr tablet Commonly known as: TOPROL-XL Take 12.5 mg by mouth daily.   MULTIPLE VITAMIN PO Take 1 tablet by mouth daily.   NIFEdipine 60 MG 24 hr tablet Commonly known as: PROCARDIA XL/NIFEDICAL XL TAKE ONE TABLET EVERY DAY    NON FORMULARY CPAP (Free Text) - Historical Medication  As directed  Started 22-Sep-1994 Active   pravastatin 40 MG tablet Commonly known as: PRAVACHOL TAKE 1 TABLET BY MOUTH DAILY   PreviDent 5000 Dry Mouth 1.1 % Gel dental gel Generic drug: sodium fluoride Place 1 application onto teeth 2 (two) times daily.        Allergies:  Allergies  Allergen Reactions   Levofloxacin Swelling    lips swelling   Povidone Iodine     Unknown, can not remember   Sulfa Antibiotics     Unknown, can not remember   Benadryl [Diphenhydramine] Rash    Family History: Family History  Problem Relation Age of Onset   Lung cancer Mother    Heart disease Father     Social History:  reports that he has quit smoking. His smoking use included cigarettes. He has never used smokeless tobacco. He reports current alcohol use of about 2.0 standard drinks of alcohol per week. He reports that he does not use drugs.  ROS: Pertinent ROS in HPI  Physical Exam: BP (!) 153/54   Pulse 76   Ht 5\' 7"  (1.702 m)   Wt 140 lb (63.5 kg)   BMI 21.93 kg/m   Constitutional:  Well nourished. Alert and oriented, No acute distress. HEENT: [place] AT, mask in place.  Trachea midline Cardiovascular: No clubbing, cyanosis, or edema. Respiratory: Normal respiratory effort, no increased work of breathing. Neurologic: Grossly intact, no focal deficits, moving all 4 extremities. Psychiatric: Normal mood and affect.  Laboratory Data: Lab Results  Component Value Date   WBC 7.0 02/25/2021   HGB 7.4 (L) 02/25/2021   HCT 23.5 (L) 02/25/2021   MCV 80.2 02/25/2021   PLT 310 02/25/2021    Lab Results  Component Value Date   CREATININE 1.50 (H) 02/25/2021    Lab Results  Component Value Date   PSA 0.1 08/30/2014    Lab Results  Component Value Date   TSH 2.690 06/05/2020    Lab Results  Component Value Date   AST 21 06/05/2020   Lab Results  Component Value Date   ALT 12 06/05/2020     Urinalysis Component     Latest Ref Rng & Units 02/28/2021  Specific Gravity, UA     1.005 - 1.030 1.025  pH, UA     5.0 - 7.5 6.5  Color, UA     Yellow Yellow  Appearance Ur     Clear Cloudy (A)  Leukocytes,UA     Negative 1+ (A)  Protein,UA     Negative/Trace 3+ (A)  Glucose, UA     Negative Negative  Ketones, UA     Negative 1+ (A)  RBC, UA     Negative 3+ (A)  Bilirubin, UA     Negative Negative  Urobilinogen, Ur     0.2 - 1.0 mg/dL 0.2  Nitrite, UA     Negative Negative  Microscopic Examination      See below:   Component     Latest Ref Rng & Units 02/28/2021  WBC, UA     0 - 5 /hpf >30 (H)  RBC     0 - 2 /hpf >30 (H)  Epithelial Cells (non renal)     0 - 10 /hpf 0-10  Bacteria, UA     None seen/Few Few (A)    I have reviewed the labs.   Pertinent Imaging: Results for Kaczorowski, KAMONI GENTLES "ELIO HADEN" (MRN [id number]) as of 02/28/2021 15:26  Ref. Range 02/28/2021 15:07  Scan Result Unknown 0   Assessment & Plan:   1.  Gross hematuria -resolved  -Hematuria work-up completed and is scheduled for TURBT on March 12, 2021  2.  Anemia -Repeat hemoglobin hematocrit are pending at this time -Hopefully, it is stabilized as he no longer has hematuria   Return for Keep follow-up appointments with pre-admit and TURBT .  These notes generated with voice recognition software. I apologize for typographical errors.  Zara Council, PA-C  [facility] [address]  [place], [place] [address] [phone]

## 2021-02-28 ENCOUNTER — Other Ambulatory Visit: Payer: Self-pay | Admitting: Urology

## 2021-02-28 ENCOUNTER — Encounter: Payer: Self-pay | Admitting: Urology

## 2021-02-28 ENCOUNTER — Ambulatory Visit (INDEPENDENT_AMBULATORY_CARE_PROVIDER_SITE_OTHER): Payer: Medicare Other | Admitting: Urology

## 2021-02-28 ENCOUNTER — Other Ambulatory Visit: Payer: Self-pay

## 2021-02-28 VITALS — BP 153/54 | HR 76 | Ht 67.0 in | Wt 140.0 lb

## 2021-02-28 DIAGNOSIS — I209 Angina pectoris, unspecified: Secondary | ICD-10-CM | POA: Diagnosis not present

## 2021-02-28 DIAGNOSIS — D494 Neoplasm of unspecified behavior of bladder: Secondary | ICD-10-CM

## 2021-02-28 DIAGNOSIS — R310 Gross hematuria: Secondary | ICD-10-CM | POA: Diagnosis not present

## 2021-02-28 LAB — URINALYSIS, COMPLETE
Bilirubin, UA: NEGATIVE
Glucose, UA: NEGATIVE
Nitrite, UA: NEGATIVE
Specific Gravity, UA: 1.025 (ref 1.005–1.030)
Urobilinogen, Ur: 0.2 mg/dL (ref 0.2–1.0)
pH, UA: 6.5 (ref 5.0–7.5)

## 2021-02-28 LAB — MICROSCOPIC EXAMINATION
RBC, Urine: >30 /hpf — ABNORMAL HIGH (ref 0–2)
WBC, UA: >30 /hpf — ABNORMAL HIGH (ref 0–5)

## 2021-02-28 LAB — BLADDER SCAN AMB NON-IMAGING: Scan Result: 0

## 2021-03-01 LAB — HEMOGLOBIN AND HEMATOCRIT, BLOOD
Hematocrit: 25.6 % — ABNORMAL LOW (ref 37.5–51.0)
Hemoglobin: 7.8 g/dL — ABNORMAL LOW (ref 13.0–17.7)

## 2021-03-03 LAB — URINE CULTURE

## 2021-03-04 ENCOUNTER — Encounter
Admission: RE | Admit: 2021-03-04 | Discharge: 2021-03-04 | Disposition: A | Discharge status: Home / Self Care | Payer: Medicare Other | Source: Ambulatory Visit | Attending: Urology | Admitting: Urology

## 2021-03-04 ENCOUNTER — Other Ambulatory Visit: Payer: Self-pay

## 2021-03-04 DIAGNOSIS — I2510 Atherosclerotic heart disease of native coronary artery without angina pectoris: Secondary | ICD-10-CM | POA: Diagnosis not present

## 2021-03-04 DIAGNOSIS — Z01818 Encounter for other preprocedural examination: Secondary | ICD-10-CM | POA: Diagnosis not present

## 2021-03-04 DIAGNOSIS — I10 Essential (primary) hypertension: Secondary | ICD-10-CM | POA: Diagnosis not present

## 2021-03-04 HISTORY — DX: Pneumonia, unspecified organism: J18.9

## 2021-03-04 HISTORY — DX: Hypothyroidism, unspecified: E03.9

## 2021-03-04 LAB — CBC
HCT: 22.7 % — ABNORMAL LOW (ref 39.0–52.0)
Hemoglobin: 6.9 g/dL — ABNORMAL LOW (ref 13.0–17.0)
MCH: 24.6 pg — ABNORMAL LOW (ref 26.0–34.0)
MCHC: 30.4 g/dL (ref 30.0–36.0)
MCV: 80.8 fL (ref 80.0–100.0)
Platelets: 268 10*3/uL (ref 150–400)
RBC: 2.81 MIL/uL — ABNORMAL LOW (ref 4.22–5.81)
RDW: 16.1 % — ABNORMAL HIGH (ref 11.5–15.5)
WBC: 6.1 10*3/uL (ref 4.0–10.5)
nRBC: 0 % (ref 0.0–0.2)

## 2021-03-04 NOTE — Progress Notes (Addendum)
  [facility] Perioperative Services: Pre-Admission/Anesthesia Testing  Abnormal Lab Notification   Date: 03/04/21  Name: Roger Cook MRN:   [id number]  Re: Abnormal labs noted during PAT appointment   Provider(s) Notified: Abbie Sons, MD and Zara Council, PA-C Notification mode: Routed and/or faxed via [place] LAB VALUE(S): Lab Results  Component Value Date   WBC 6.1 03/04/2021   HGB 6.9 (L) 03/04/2021   HCT 22.7 (L) 03/04/2021   MCV 80.8 03/04/2021   PLT 268 03/04/2021   Notes:  Patient is scheduled for a TURBT on 03/12/2021. In review of the most recent notes from urology dated 02/28/2021, patient was experiencing hematuria, however advised that it was improving. Hgb in the ED was 7.4 g/dL on 02/25/2021. Repeat labs in the urology office x 4 days ago (02/28/2021) revealed that  Hgb had slightly improved to 7.8 g/dL. Sending copy of today's lab to primary attending surgeon and his APP for review.   This is a Community education officer; no formal response is required.  Honor Loh, MSN, APRN, FNP-C, CEN [facility]  Peri-operative Services Nurse Practitioner Phone: [phone] Fax: [phone] 03/04/21 3:50 PM

## 2021-03-04 NOTE — Patient Instructions (Addendum)
Your procedure is scheduled on:  Tuesday, June 21 Report to the Registration Desk on the 1st floor of the [facility]. To find out your arrival time, please call [phone] between 1PM - 3PM on: Monday, June 20  REMEMBER: Instructions that are not followed completely may result in serious medical risk, up to and including death; or upon the discretion of your surgeon and anesthesiologist your surgery may need to be rescheduled.  Do not eat or drink after midnight the night before surgery.  No gum chewing, lozengers or hard candies.  TAKE THESE MEDICATIONS THE MORNING OF SURGERY WITH A SIP OF WATER:  Levothyroxine Metoprolol Isosorbide  One week prior to surgery: starting June 14 Stop aspirin and Anti-inflammatories (NSAIDS) such as Advil, Aleve, Ibuprofen, Motrin, Naproxen, Naprosyn and Aspirin based products such as Excedrin, Goodys Powder, BC Powder. Stop ANY OVER THE COUNTER supplements until after surgery. You may however, continue to take Tylenol if needed for pain up until the day of surgery.  No Alcohol for 24 hours before or after surgery.  On the morning of surgery brush your teeth with toothpaste and water, you may rinse your mouth with mouthwash if you wish. Do not swallow any toothpaste or mouthwash.  Do not wear jewelry.  Do not wear lotions, powders, or perfumes.   Do not shave body from the neck down 48 hours prior to surgery just in case you cut yourself which could leave a site for infection.   Contact lenses, hearing aids and dentures may not be worn into surgery.  Do not bring valuables to the hospital. [facility] is not responsible for any missing/lost belongings or valuables.   Bring your C-PAP to the hospital with you in case you may have to spend the night.   Notify your doctor if there is any change in your medical condition (cold, fever, infection).  Wear comfortable clothing (specific to your surgery type) to the hospital.  After surgery, you  can help prevent lung complications by doing breathing exercises.  Take deep breaths and cough every 1-2 hours. Your doctor may order a device called an Incentive Spirometer to help you take deep breaths.  If you are being discharged the day of surgery, you will not be allowed to drive home. You will need a responsible adult (18 years or older) to drive you home and stay with you that night.   If you are taking public transportation, you will need to have a responsible adult (18 years or older) with you. Please confirm with your physician that it is acceptable to use public transportation.   Please call the [place] Dept. at [phone] if you have any questions about these instructions.  Surgery Visitation Policy:  Patients undergoing a surgery or procedure may have one family member or support person with them as long as that person is not COVID-19 positive or experiencing its symptoms.  That person may remain in the waiting area during the procedure.

## 2021-03-06 ENCOUNTER — Other Ambulatory Visit: Payer: Self-pay

## 2021-03-06 ENCOUNTER — Encounter
Admission: RE | Admit: 2021-03-06 | Discharge: 2021-03-06 | Disposition: A | Discharge status: Home / Self Care | Payer: Medicare Other | Source: Ambulatory Visit | Attending: Urology | Admitting: Urology

## 2021-03-06 ENCOUNTER — Encounter: Payer: Self-pay | Admitting: Urology

## 2021-03-06 DIAGNOSIS — Z01818 Encounter for other preprocedural examination: Secondary | ICD-10-CM | POA: Insufficient documentation

## 2021-03-06 DIAGNOSIS — I10 Essential (primary) hypertension: Secondary | ICD-10-CM | POA: Insufficient documentation

## 2021-03-06 DIAGNOSIS — I2510 Atherosclerotic heart disease of native coronary artery without angina pectoris: Secondary | ICD-10-CM | POA: Diagnosis not present

## 2021-03-06 LAB — URINALYSIS, COMPLETE (UACMP) WITH MICROSCOPIC
Bilirubin Urine: NEGATIVE
Glucose, UA: NEGATIVE mg/dL
Ketones, ur: 5 mg/dL — AB
Nitrite: NEGATIVE
Protein, ur: 100 mg/dL — AB
RBC / HPF: >50 RBC/hpf — ABNORMAL HIGH (ref 0–5)
Specific Gravity, Urine: 1.026 (ref 1.005–1.030)
WBC, UA: >50 WBC/hpf — ABNORMAL HIGH (ref 0–5)
pH: 5 (ref 5.0–8.0)

## 2021-03-06 LAB — HEMOGLOBIN AND HEMATOCRIT, BLOOD
HCT: 24.1 % — ABNORMAL LOW (ref 39.0–52.0)
Hemoglobin: 7.4 g/dL — ABNORMAL LOW (ref 13.0–17.0)

## 2021-03-06 NOTE — Progress Notes (Signed)
Perioperative Services  Pre-Admission/Anesthesia Testing Clinical Review  Date: 03/07/21  Patient Demographics:  Name: Roger Cook DOB:   December 09, 1926 MRN:   [id number]  Planned Surgical Procedure(s):   Choose an anesthesia record to view details      NOTE: Available PAT nursing documentation and vital signs have been reviewed. Clinical nursing staff has updated patient's PMH/PSHx, current medication list, and drug allergies/intolerances to ensure comprehensive history available to assist in medical decision making as it pertains to the aforementioned surgical procedure and anticipated anesthetic course. Extensive review of available clinical information performed. [place] PMH and PSHx updated with any diagnoses/procedures that  may have been inadvertently omitted during his intake with the pre-admission testing department's nursing staff.  Clinical Discussion:  Roger Cook is a 87 y.o. male who is submitted for pre-surgical anesthesia review and clearance prior to him undergoing the above procedure. Patient is a Former Smoker (16 pack years; quit 1964). Pertinent PMH includes: CAD, angina, aortic atherosclerosis, HTN, HLD, hypothyroidism, DOE, GERD (no daily Tx), OSAH (requires nocturnal PAP therapy), anemia, bladder tumor, prostate cancer (s/p resection).  Patient is followed by cardiology Clayborn Bigness, MD). He was last seen in the cardiology clinic on 08/10/2020; notes reviewed.  At the time of his clinic visit, patient doing well overall from a cardiovascular perspective.  He denied any chest pain, shortness breath, PND, orthopnea, palpitations, significant peripheral edema, vertiginous symptoms, or presyncope/syncope.  Patient with a PMH significant for cardiovascular diagnoses.  Patient had a positive EST and subsequently  underwent diagnostic left heart catheterization in 07/1990 revealing 75% stenosis of the proximal LCx, 75% stenosis of the mid LCx, 25% stenosis of the mid LAD,  and 75% stenosis of D2.  Records do not indicate that stents were placed.  Patient also had a 50% stenosis of the right renal artery.  LVEF was 62%.  TTE performed on 11/03/2019 demonstrated a globally normal systolic function with an LVEF of >55%.  There was trivial to mild valvular insufficiency, in addition to mild left atrial enlargement.  There was no evidence of valvular stenosis.  Subsequent myocardial perfusion imaging study revealed no evidence of stress-induced myocardial ischemia or arrhythmia; LVEF 63% (see full interpretation of cardiovascular testing below).  Patient on GDMT for his HTN and HLD diagnoses.  Blood pressure moderately controlled on currently prescribed nitrate, CCB, and beta-blocker therapies.  Patient is on a statin for his HLD. Functional capacity, as defined by DASI, is documented as being >/= 4 METS; walks for exercise.  No changes were made to patient's medication regimen.  Patient to follow-up with outpatient cardiology in 3 months or sooner if needed.  Patient is scheduled for a TURBT on 03/12/2021 with Dr. John Giovanni.  As part of his PAT appointment, patient has preoperative lab testing performed due to concerns related to previous anemia. Patient found to have a further downward trend in his hemoglobin and hematocrit in the setting on persistent gross hematuria; Hgb 6.9 g/dL and Hct 22.7. Hemoglobin has dropped roughly 1 point since it was checked by urology on 02/28/2021. Lab value communicated to surgeon and his APP; see PAT APP note in Epic. I inquired about the need for preprocedural PRBC transfusion. Per urology Ernestine Conrad, PA-C), unless anesthesia requires and/or recommends transfusion, urology fine with proceeding with TURBT without it. Urology APP has requested that patient be brought back in for a repeat UA with C&S prior to surgery.   Patient reviewed with anesthesiologist on call Ronelle Nigh, MD). Discussed concern of declining Hgb  and need for pre-surgical PRBC  transfusion. MD agreed with assessment and plan. Will plan on rechecking Hgb and Hct today (03/06/2021). If persistently low, we will schedule him for a unit of PRBCs. Patient contacted to discuss the above. Patient to RTC today for repeat blood and urine testing.   Patient denies previous perioperative complications with anesthesia in the past. In review of the available records, it is noted that patient underwent a general anesthetic course here (ASA III) in 06/2012 without documented complications. Given patient's past medical history significant for cardiovascular diagnoses, presurgical cardiac clearance was sought by the PAT team. Per cardiology, "this patient is optimized for surgery and may proceed with the planned procedural course with a LOW risk stratification".  This patient is on daily antiplatelet therapy. He has been instructed on recommendations for holding his daily low-dose ASA for 7 days prior to his procedure with plans to restart 2 days post-operatively, or as soon as postoperative bleeding risk felt to be minimized by his attending surgeon. The patient has been instructed that his last dose of his anticoagulant will be on 03/04/2021.  Vitals with BMI 03/07/2021 03/07/2021 03/07/2021  Height - - -  Weight - - -  BMI - - -  Systolic 338 250 539  Diastolic 51 36 42  Pulse 54 56 58    Providers/Specialists:   NOTE: Primary physician provider listed below. Patient may have been seen by APP or partner within same practice.   PROVIDER ROLE / SPECIALTY LAST Claud Kelp, MD  Urology (Surgeon) 02/28/2021  [name], MD  Primary Care Provider 12/18/2020  Katrine Coho, MD  Cardiology 08/10/2020   Allergies:  Levofloxacin, Povidone iodine, Sulfa antibiotics, and Benadryl [diphenhydramine]  Current Home Medications:   No current facility-administered medications for this encounter.    ARTIFICIAL TEAR SOLUTION OP   aspirin 81 MG tablet   brimonidine (ALPHAGAN)  0.2 % ophthalmic solution   donepezil (ARICEPT) 10 MG tablet   fluticasone (FLONASE) 50 MCG/ACT nasal spray   levothyroxine (SYNTHROID) 88 MCG tablet   metoprolol succinate (TOPROL-XL) 25 MG 24 hr tablet   MULTIPLE VITAMIN PO   NIFEdipine (PROCARDIA XL/NIFEDICAL XL) 60 MG 24 hr tablet   PREVIDENT 5000 DRY MOUTH 1.1 % GEL dental gel   isosorbide mononitrate (IMDUR) 60 MG 24 hr tablet   NON FORMULARY   pravastatin (PRAVACHOL) 40 MG tablet    0.9 %  sodium chloride infusion   History:   Past Medical History:  Diagnosis Date   Anemia    Aortic atherosclerosis (HCC)    Bell palsy    Bilateral renal cysts    Bladder tumor    CAD (coronary artery disease)    DOE (dyspnea on exertion)    GERD (gastroesophageal reflux disease)    History of angina    History of penile implant    History of prostate cancer    Hyperlipidemia    Hypertension    Hypothyroidism    Lipoma    MRSA bacteremia 02/2011   Nephrolithiasis    OSA on CPAP    Pneumonia    as a teenager   Past Surgical History:  Procedure Laterality Date   APPENDECTOMY  2005   CARDIAC CATHETERIZATION N/A 08/13/1990   75% pLCx, 75% mLCx, 25% mLAD, 75% D2, 50% right renal artery; Location: [facility]; Surgeon: Doristine Bosworth, MD   CATARACT EXTRACTION  2005   also had a macular hole in 2005   CATARACT EXTRACTION  2008  COLONOSCOPY     2003, 2014   ESOPHAGOGASTRODUODENOSCOPY     2004, 2014   HAND SURGERY Left 06/26/2011   Malignancy removed from left hand   [place]   penile inplant  1984   polyp of rectum  2003   [place]   Abdominal, had to have radiation treatment with the procedure   PROSTATE SURGERY     TONSILLECTOMY     Family History  Problem Relation Age of Onset   Lung cancer Mother    Heart disease Father    Social History   Tobacco Use   Smoking status: Former    Packs/day: 1.00    Years: 16.00    Pack years: 16.00    Types: Cigarettes     Quit date: 1964    Years since quitting: 58.4   Smokeless tobacco: Never  Vaping Use   Vaping Use: Never used  Substance Use Topics   Alcohol use: Yes    Alcohol/week: 2.0 standard drinks    Types: 2 Glasses of wine per week   Drug use: No    Pertinent Clinical Results:  LABS: Labs reviewed: Repeat   Hospital Outpatient Visit on 03/06/2021  Component Date Value Ref Range Status   Hemoglobin 03/06/2021 7.4 (A) 13.0 - 17.0 g/dL Final   HCT 03/06/2021 24.1 (A) 39.0 - 52.0 % Final   Performed at [facility], [address] [place], [place] [address]   ABO/RH(D) 03/06/2021 AB POS   Final   Antibody Screen 03/06/2021 NEG   Final   Sample Expiration 03/06/2021 03/20/2021,2359   Final   Extend sample reason 03/06/2021 NO TRANSFUSIONS OR PREGNANCY IN THE PAST 3 MONTHS   Final   Unit Number 03/06/2021 [id number]   Final   Blood Component Type 03/06/2021 RED CELLS,LR   Final   Unit division 03/06/2021 00   Final   Status of Unit 03/06/2021 ISSUED   Final   Transfusion Status 03/06/2021 OK TO TRANSFUSE   Final   Crossmatch Result 03/06/2021    Final                   Value:Compatible Performed at [facility], [place], [place] [address]    Color, Urine 03/06/2021 YELLOW (A) YELLOW Final   APPearance 03/06/2021 HAZY (A) CLEAR Final   Specific Gravity, Urine 03/06/2021 1.026  1.005 - 1.030 Final   pH 03/06/2021 5.0  5.0 - 8.0 Final   Glucose, UA 03/06/2021 NEGATIVE  NEGATIVE mg/dL Final   Hgb urine dipstick 03/06/2021 SMALL (A) NEGATIVE Final   Bilirubin Urine 03/06/2021 NEGATIVE  NEGATIVE Final   Ketones, ur 03/06/2021 5 (A) NEGATIVE mg/dL Final   Protein, ur 03/06/2021 100 (A) NEGATIVE mg/dL Final   Nitrite 03/06/2021 NEGATIVE  NEGATIVE Final   Leukocytes,Ua 03/06/2021 LARGE (A) NEGATIVE Final   RBC / HPF 03/06/2021 >50 (A) 0 - 5 RBC/hpf Final   WBC, UA 03/06/2021 >50 (A) 0 - 5 WBC/hpf Final   Bacteria, UA 03/06/2021 RARE (A) NONE SEEN Final    Squamous Epithelial / LPF 03/06/2021 0-5  0 - 5 Final   Mucus 03/06/2021 PRESENT   Final   Performed at [facility], [address], [place],  [address]   ISSUE DATE / TIME 03/06/2021 [id number]   Final   Blood Product Unit Number 03/06/2021 [id number]   Final   PRODUCT CODE 03/06/2021 [id number]  Final   Unit Type and Rh 03/06/2021 7300   Final   Blood Product Expiration Date 03/06/2021 [id number]   Final   Lab Results  Component Value Date   NA 139 02/25/2021   K 4.5 02/25/2021   CO2 23 02/25/2021   GLUCOSE 100 (H) 02/25/2021   BUN 31 (H) 02/25/2021   CREATININE 1.50 (H) 02/25/2021   CALCIUM 8.7 (L) 02/25/2021   GFRNONAA 44 (L) 02/25/2021   GFRAA 51 (L) 06/05/2020    ECG: Date: 03/04/2021 Time ECG obtained: 1413 PM Rate: 68 bpm Rhythm: normal sinus Axis (leads I and aVF): Normal Intervals: PR 164 ms. QRS 84 ms. QTc 438 ms. ST segment and T wave changes: No evidence of acute ST segment elevation or depression Comparison: Similar to previous tracing obtained on 11/19/2017  IMAGING / PROCEDURES: CT HEMATURIA WORKUP performed on 06/04/2020 No acute findings within the abdomen or pelvis. Non-obstructing left renal calculus. Bilateral kidney cysts. Multiple small cystic lesions within the pancreas. Likely benign. According to consensus criteria follow-up imaging in 24 months with pancreas protocol MRI or CT is recommended. Note, the decision to pursue imaging follow-up and/or endoscopic ultrasound/FNA should be anchored to the patient's overall health and preferences; such workup is only advised if the patient is a surgical candidate.  Aortic atherosclerosis  LEXISCAN performed on 11/03/2019 LVEF 63% Regional wall motion reveals normal myocardial thickening and wall motion No artifacts noted Left ventricular cavity size normal No evidence of stress induced myocardial ischemia or arrhythmia The overall quality of the study is good  TRANSTHORACIC  ECHOCARDIOGRAM performed no 11/03/2019 Normal left ventricular systolic function with an estimated EF of >55% Normal right ventricular systolic function Trivial AR, MR, and TR no valvular stenosis Mild left atrial enlargement No pericardial effusion  Impression and Plan:  Roger Cook has been referred for pre-anesthesia review and clearance prior to him undergoing the planned anesthetic and procedural courses. Available labs, pertinent testing, and imaging results were personally reviewed by me. This patient has been appropriately cleared by cardiology with an overall LOW risk of significant perioperative cardiovascular complications.  Patient with concerning lab values. After discussing with attending anesthesiologist, the decision was made to recheck Hgb and Hct levels. Repeat lab testing revealed a Hgb of 7.4 g/dL and Hct of 24.1. Patient pale in color on exam. He reports minor fatigue and DOE. He denied any diaphoresis, chest pain, vertiginous symptoms, or presyncope/syncope. Patient not noted to be tachycardic or tachypneic. Based on repeat labs, persistent microscopic hematuria, and in light of his upcoming surgery, will make efforts to optimize patient's pre-surgical blood counts. Order entered for 1 unit PRBCs. Per patient preference, he was given an appointment to receive his transfusion on 03/07/2021 at 0900. Additionally, order entered to have Hgb and Hct level checked on the day of surgery to ensure blood counts are at a safe level to proceed.   Based on clinical review performed today (03/07/21), barring any significant acute changes in the patient's overall condition, and assuming his blood counts remain appropriate following the transfusion, it is anticipated that he will be able to proceed with the planned surgical intervention. Any acute changes in clinical condition may necessitate his procedure being postponed and/or cancelled. Patient will meet with anesthesia team (MD and/or CRNA)  on the day of his procedure for preoperative evaluation/assessment. Questions regarding anesthetic course will be fielded at that time.   Pre-surgical instructions were reviewed with the patient during his PAT appointment and questions were fielded by  PAT clinical staff. Patient was advised that if any questions or concerns arise prior to his procedure then he should return a call to PAT and/or his surgeon's office to discuss.  Honor Loh, MSN, APRN, FNP-C, CEN [facility]  Peri-operative Services Nurse Practitioner Phone: [phone] Fax: [phone] 03/07/21 12:40 PM  NOTE: This note has been prepared using Dragon dictation software. Despite my best ability to proofread, there is always the potential that unintentional transcriptional errors may still occur from this process.

## 2021-03-07 ENCOUNTER — Ambulatory Visit
Admission: RE | Admit: 2021-03-07 | Discharge: 2021-03-07 | Disposition: A | Discharge status: Home / Self Care | Payer: Medicare Other | Source: Ambulatory Visit | Attending: Family Medicine | Admitting: Family Medicine

## 2021-03-07 DIAGNOSIS — D62 Acute posthemorrhagic anemia: Secondary | ICD-10-CM | POA: Insufficient documentation

## 2021-03-07 LAB — PREPARE RBC (CROSSMATCH)

## 2021-03-07 MED ORDER — SODIUM CHLORIDE 0.9 % IV SOLN: 10.0000 mL/h | Freq: Once | INTRAVENOUS | Status: DC

## 2021-03-08 LAB — BPAM RBC
Blood Product Expiration Date: 202206252359
ISSUE DATE / TIME: 202206160916
Unit Type and Rh: 7300

## 2021-03-08 LAB — TYPE AND SCREEN
ABO/RH(D): AB POS
Antibody Screen: NEGATIVE
Unit division: 0

## 2021-03-08 LAB — URINE CULTURE: Culture: NO GROWTH

## 2021-03-12 ENCOUNTER — Ambulatory Visit: Payer: Medicare Other | Admitting: Urgent Care

## 2021-03-12 ENCOUNTER — Encounter
Admission: RE | Disposition: A | Discharge status: Home / Self Care | Payer: Self-pay | Source: Home / Self Care | Attending: Urology

## 2021-03-12 ENCOUNTER — Ambulatory Visit
Admission: RE | Admit: 2021-03-12 | Discharge: 2021-03-12 | Disposition: A | Discharge status: Home / Self Care | Payer: Medicare Other | Attending: Urology | Admitting: Urology

## 2021-03-12 ENCOUNTER — Encounter: Payer: Self-pay | Admitting: Urology

## 2021-03-12 DIAGNOSIS — C679 Malignant neoplasm of bladder, unspecified: Secondary | ICD-10-CM | POA: Diagnosis not present

## 2021-03-12 DIAGNOSIS — E039 Hypothyroidism, unspecified: Secondary | ICD-10-CM | POA: Diagnosis not present

## 2021-03-12 DIAGNOSIS — Z888 Allergy status to other drugs, medicaments and biological substances status: Secondary | ICD-10-CM | POA: Insufficient documentation

## 2021-03-12 DIAGNOSIS — Z881 Allergy status to other antibiotic agents status: Secondary | ICD-10-CM | POA: Insufficient documentation

## 2021-03-12 DIAGNOSIS — Z79890 Hormone replacement therapy (postmenopausal): Secondary | ICD-10-CM | POA: Diagnosis not present

## 2021-03-12 DIAGNOSIS — Z882 Allergy status to sulfonamides status: Secondary | ICD-10-CM | POA: Diagnosis not present

## 2021-03-12 DIAGNOSIS — R319 Hematuria, unspecified: Secondary | ICD-10-CM | POA: Diagnosis not present

## 2021-03-12 DIAGNOSIS — D494 Neoplasm of unspecified behavior of bladder: Secondary | ICD-10-CM

## 2021-03-12 DIAGNOSIS — Z8546 Personal history of malignant neoplasm of prostate: Secondary | ICD-10-CM | POA: Insufficient documentation

## 2021-03-12 DIAGNOSIS — Z7982 Long term (current) use of aspirin: Secondary | ICD-10-CM | POA: Insufficient documentation

## 2021-03-12 DIAGNOSIS — N3020 Other chronic cystitis without hematuria: Secondary | ICD-10-CM | POA: Insufficient documentation

## 2021-03-12 DIAGNOSIS — Z801 Family history of malignant neoplasm of trachea, bronchus and lung: Secondary | ICD-10-CM | POA: Insufficient documentation

## 2021-03-12 DIAGNOSIS — D649 Anemia, unspecified: Secondary | ICD-10-CM | POA: Insufficient documentation

## 2021-03-12 DIAGNOSIS — N3289 Other specified disorders of bladder: Secondary | ICD-10-CM | POA: Diagnosis not present

## 2021-03-12 DIAGNOSIS — Z8614 Personal history of Methicillin resistant Staphylococcus aureus infection: Secondary | ICD-10-CM | POA: Insufficient documentation

## 2021-03-12 DIAGNOSIS — Z79899 Other long term (current) drug therapy: Secondary | ICD-10-CM | POA: Diagnosis not present

## 2021-03-12 HISTORY — DX: Presence of urogenital implants: Z96.0

## 2021-03-12 HISTORY — DX: Obstructive sleep apnea (adult) (pediatric): G47.33

## 2021-03-12 HISTORY — DX: Other forms of dyspnea: R06.09

## 2021-03-12 HISTORY — DX: Calculus of kidney: N20.0

## 2021-03-12 HISTORY — DX: Gastro-esophageal reflux disease without esophagitis: K21.9

## 2021-03-12 HISTORY — DX: Personal history of other diseases of the circulatory system: Z86.79

## 2021-03-12 HISTORY — DX: Atherosclerosis of aorta: I70.0

## 2021-03-12 HISTORY — PX: TRANSURETHRAL RESECTION OF BLADDER TUMOR: SHX2575

## 2021-03-12 HISTORY — DX: Cyst of kidney, acquired: N28.1

## 2021-03-12 HISTORY — DX: Dyspnea, unspecified: R06.00

## 2021-03-12 LAB — HEMOGLOBIN AND HEMATOCRIT, BLOOD
HCT: 28.5 % — ABNORMAL LOW (ref 39.0–52.0)
Hemoglobin: 8.8 g/dL — ABNORMAL LOW (ref 13.0–17.0)

## 2021-03-12 SURGERY — TURBT (TRANSURETHRAL RESECTION OF BLADDER TUMOR)
Anesthesia: General

## 2021-03-12 MED ORDER — CEFAZOLIN SODIUM-DEXTROSE 2-4 GM/100ML-% IV SOLN
2.0000 g | INTRAVENOUS | Status: AC
  Administered 2021-03-12: 2 g via INTRAVENOUS

## 2021-03-12 MED ORDER — ACETAMINOPHEN 10 MG/ML IV SOLN
INTRAVENOUS | Status: DC | PRN
  Administered 2021-03-12: 1000 mg via INTRAVENOUS

## 2021-03-12 MED ORDER — GLYCOPYRROLATE 0.2 MG/ML IJ SOLN
INTRAMUSCULAR | Status: DC | PRN
  Administered 2021-03-12: .2 mg via INTRAVENOUS

## 2021-03-12 MED ORDER — CEFAZOLIN SODIUM-DEXTROSE 2-4 GM/100ML-% IV SOLN
INTRAVENOUS | Status: AC
  Filled 2021-03-12: qty 100

## 2021-03-12 MED ORDER — MEPERIDINE HCL 25 MG/ML IJ SOLN: 6.2500 mg | INTRAMUSCULAR | Status: DC | PRN

## 2021-03-12 MED ORDER — FENTANYL CITRATE (PF) 100 MCG/2ML IJ SOLN
INTRAMUSCULAR | Status: AC
  Administered 2021-03-12: 25 ug via INTRAVENOUS
  Filled 2021-03-12: qty 2

## 2021-03-12 MED ORDER — LIDOCAINE HCL (CARDIAC) PF 100 MG/5ML IV SOSY
PREFILLED_SYRINGE | INTRAVENOUS | Status: DC | PRN
  Administered 2021-03-12: 60 mg via INTRAVENOUS

## 2021-03-12 MED ORDER — ONDANSETRON HCL 4 MG/2ML IJ SOLN: 4.0000 mg | Freq: Once | INTRAMUSCULAR | Status: DC | PRN

## 2021-03-12 MED ORDER — FENTANYL CITRATE (PF) 100 MCG/2ML IJ SOLN
25.0000 ug | INTRAMUSCULAR | Status: DC | PRN
  Administered 2021-03-12: 25 ug via INTRAVENOUS

## 2021-03-12 MED ORDER — FAMOTIDINE 20 MG PO TABS
ORAL_TABLET | ORAL | Status: AC
  Administered 2021-03-12: 20 mg via ORAL
  Filled 2021-03-12: qty 1

## 2021-03-12 MED ORDER — PROPOFOL 10 MG/ML IV BOLUS
INTRAVENOUS | Status: DC | PRN
  Administered 2021-03-12: 100 mg via INTRAVENOUS

## 2021-03-12 MED ORDER — DEXAMETHASONE SODIUM PHOSPHATE 10 MG/ML IJ SOLN
INTRAMUSCULAR | Status: DC | PRN
  Administered 2021-03-12: 10 mg via INTRAVENOUS

## 2021-03-12 MED ORDER — ACETAMINOPHEN 10 MG/ML IV SOLN
INTRAVENOUS | Status: AC
  Filled 2021-03-12: qty 100

## 2021-03-12 MED ORDER — CHLORHEXIDINE GLUCONATE 0.12 % MT SOLN: 15.0000 mL | Freq: Once | OROMUCOSAL | Status: AC

## 2021-03-12 MED ORDER — LACTATED RINGERS IV SOLN: INTRAVENOUS | Status: DC

## 2021-03-12 MED ORDER — CHLORHEXIDINE GLUCONATE 0.12 % MT SOLN
OROMUCOSAL | Status: AC
  Administered 2021-03-12: 15 mL via OROMUCOSAL
  Filled 2021-03-12: qty 15

## 2021-03-12 MED ORDER — PROPOFOL 500 MG/50ML IV EMUL
INTRAVENOUS | Status: AC
  Filled 2021-03-12: qty 100

## 2021-03-12 MED ORDER — EPHEDRINE SULFATE 50 MG/ML IJ SOLN
INTRAMUSCULAR | Status: DC | PRN
  Administered 2021-03-12: 5 mg via INTRAVENOUS

## 2021-03-12 MED ORDER — FAMOTIDINE 20 MG PO TABS: 20.0000 mg | ORAL_TABLET | Freq: Once | ORAL | Status: AC

## 2021-03-12 MED ORDER — PHENYLEPHRINE HCL (PRESSORS) 10 MG/ML IV SOLN
INTRAVENOUS | Status: AC
  Filled 2021-03-12: qty 1

## 2021-03-12 MED ORDER — ONDANSETRON HCL 4 MG/2ML IJ SOLN
INTRAMUSCULAR | Status: DC | PRN
  Administered 2021-03-12: 4 mg via INTRAVENOUS

## 2021-03-12 MED ORDER — ORAL CARE MOUTH RINSE: 15.0000 mL | Freq: Once | OROMUCOSAL | Status: AC

## 2021-03-12 MED ORDER — FENTANYL CITRATE (PF) 100 MCG/2ML IJ SOLN
INTRAMUSCULAR | Status: DC | PRN
  Administered 2021-03-12 (×3): 25 ug via INTRAVENOUS

## 2021-03-12 MED ORDER — FENTANYL CITRATE (PF) 100 MCG/2ML IJ SOLN
INTRAMUSCULAR | Status: AC
  Filled 2021-03-12: qty 2

## 2021-03-12 MED ORDER — OXYCODONE HCL 5 MG PO TABS: 5.0000 mg | ORAL_TABLET | Freq: Once | ORAL | Status: DC | PRN

## 2021-03-12 MED ORDER — OXYCODONE HCL 5 MG/5ML PO SOLN: 5.0000 mg | Freq: Once | ORAL | Status: DC | PRN

## 2021-03-12 SURGICAL SUPPLY — 27 items
BAG DRAIN CYSTO-URO LG1000N (MISCELLANEOUS) ×2 IMPLANT
BAG DRN RND TRDRP ANRFLXCHMBR (UROLOGICAL SUPPLIES) ×1
BAG URINE DRAIN 2000ML AR STRL (UROLOGICAL SUPPLIES) ×2 IMPLANT
CATH COUDE FOLEY 2W 5CC 16FR (CATHETERS) ×1 IMPLANT
CATH FOLEY 2WAY 18X30 (CATHETERS) IMPLANT
CATH FOLEY 2WAY SIL 16X30 (CATHETERS) IMPLANT
CATH FOLEY 2WAY SIL 18X30 (CATHETERS)
DRAPE UTILITY 15X26 TOWEL STRL (DRAPES) ×2 IMPLANT
DRSG TELFA 4X3 1S NADH ST (GAUZE/BANDAGES/DRESSINGS) ×2 IMPLANT
ELECT LOOP 22F BIPOLAR SML (ELECTROSURGICAL) ×2
ELECT REM PT RETURN 9FT ADLT (ELECTROSURGICAL) ×2
ELECTRODE LOOP 22F BIPOLAR SML (ELECTROSURGICAL) IMPLANT
ELECTRODE REM PT RTRN 9FT ADLT (ELECTROSURGICAL) IMPLANT
GLOVE SURG UNDER POLY LF SZ7.5 (GLOVE) ×2 IMPLANT
GOWN STRL REUS W/ TWL LRG LVL3 (GOWN DISPOSABLE) ×1 IMPLANT
GOWN STRL REUS W/TWL LRG LVL3 (GOWN DISPOSABLE) ×2
GOWN STRL REUS W/TWL XL LVL4 (GOWN DISPOSABLE) ×2 IMPLANT
IV NS IRRIG 3000ML ARTHROMATIC (IV SOLUTION) ×4 IMPLANT
KIT TURNOVER CYSTO (KITS) ×2 IMPLANT
LOOP CUT BIPOLAR 24F LRG (ELECTROSURGICAL) IMPLANT
MANIFOLD NEPTUNE II (INSTRUMENTS) ×2 IMPLANT
PACK CYSTO AR (MISCELLANEOUS) ×2 IMPLANT
SET IRRIG Y TYPE TUR BLADDER L (SET/KITS/TRAYS/PACK) ×2 IMPLANT
SURGILUBE 2OZ TUBE FLIPTOP (MISCELLANEOUS) ×2 IMPLANT
SYR TOOMEY IRRIG 70ML (MISCELLANEOUS) ×2
SYRINGE TOOMEY IRRIG 70ML (MISCELLANEOUS) ×1 IMPLANT
WATER STERILE IRR 1000ML POUR (IV SOLUTION) ×2 IMPLANT

## 2021-03-12 NOTE — Anesthesia Procedure Notes (Signed)
Procedure Name: LMA Insertion Date/Time: 03/12/2021 7:33 AM Performed by: Kelton Pillar, CRNA Pre-anesthesia Checklist: Patient identified, Emergency Drugs available, Suction available and Patient being monitored Patient Re-evaluated:Patient Re-evaluated prior to induction Oxygen Delivery Method: Circle system utilized Preoxygenation: Pre-oxygenation with 100% oxygen Induction Type: IV induction Ventilation: Mask ventilation without difficulty LMA: LMA inserted LMA Size: 3.5 Placement Confirmation: positive ETCO2, CO2 detector and breath sounds checked- equal and bilateral Tube secured with: Tape Dental Injury: Teeth and Oropharynx as per pre-operative assessment

## 2021-03-12 NOTE — Op Note (Signed)
Preoperative diagnosis: Bladder tumor (2-5 cm)  Postoperative diagnosis:  Bladder tumor (2-5 cm)  Procedure:  Cystoscopy Transurethral resection of bladder tumor (2-5)   Surgeon: Nicki Reaper C. Rael Tilly, M.D.  Anesthesia: General  Complications: None  Intraoperative findings:  Cystoscopy-urethra normal, without stricture, prostate surgically absent.  Ureteral orifices with clear efflux and clear of any lesions The following bladder mucosal abnormalities were identified: 2 cm keratinized lesion inflammatory changes left lateral wall 1 cm keratinized lesion with inflammatory change mid posterior wall 15 mm keratinized lesion with inflammatory change right lateral wall 1 cm keratinized lesion with inflammatory changes left bladder base   EBL: Minimal  Specimens: The above lesions were sent separately      Indication: Roger Cook is a patient with a history of radical prostatectomy in the 1980s with postop radiation therapy.  Recently with intermittent gross hematuria.  CT urogram without upper tract abnormalities office cystoscopy remarkable for a left lateral wall lesion.  After reviewing the management options for treatment, he elected to proceed with the above surgical procedure(s). We have discussed the potential benefits and risks of the procedure, side effects of the proposed treatment, the likelihood of the patient achieving the goals of the procedure, and any potential problems that might occur during the procedure or recuperation. Informed consent has been obtained.  Description of procedure:  The patient was taken to the operating room and general anesthesia was induced.  The patient was placed in the dorsal lithotomy position, prepped and draped in the usual sterile fashion, and preoperative antibiotics were administered. A preoperative time-out was performed.   A 24 French resectoscope sheath with obturator was lubricated and passed per urethra and advanced into the  bladder.  The [facility] resectoscope with loop was then placed into the sheath.  Panendoscopy was performed with findings as described above.  Using loop cautery resection, the entire lesions were resected down to superficial muscle and removed for permanent pathologic analysis.  After each lesion was resected the specimen was removed and sent separately  Hemostasis was then achieved with the bipolar cautery and the bladder was emptied and reinspected with no further bleeding noted at the end of the procedure.    The bladder was then emptied and the procedure ended.  A 16 French Foley catheter was placed with return of clear effluent upon irrigation.  The patient appeared to tolerate the procedure well and without complications.  After anesthetic reversal he was transported to the PACU in stable condition.  Plan: Catheter will be removed in PACU if urine clear-pink He will be contacted with the pathology results Office follow-up scheduled approximately 1 month   [name] C. Bernardo Heater,  MD

## 2021-03-12 NOTE — Anesthesia Postprocedure Evaluation (Signed)
Anesthesia Post Note  Patient: TAYTUM WHELLER  Procedure(s) Performed: TRANSURETHRAL RESECTION OF BLADDER TUMOR (TURBT)  Patient location during evaluation: PACU Anesthesia Type: General Level of consciousness: awake and alert and oriented Pain management: pain level controlled Vital Signs Assessment: post-procedure vital signs reviewed and stable Respiratory status: spontaneous breathing, nonlabored ventilation and respiratory function stable Cardiovascular status: blood pressure returned to baseline and stable Postop Assessment: no signs of nausea or vomiting Anesthetic complications: no   No notable events documented.   Last Vitals:  Vitals:   03/12/21 0915 03/12/21 0926  BP: (!) 170/60 (!) 175/57  Pulse: (!) 57 63  Resp: 18 14  Temp: (!) 36.2 C (!) 36.1 C  SpO2: 96% 96%    Last Pain:  Vitals:   03/12/21 0926  TempSrc: Temporal  PainSc: 0-No pain                 Jazziel Fitzsimmons

## 2021-03-12 NOTE — Transfer of Care (Signed)
Immediate Anesthesia Transfer of Care Note  Patient: Roger Cook  Procedure(s) Performed: TRANSURETHRAL RESECTION OF BLADDER TUMOR (TURBT)  Patient Location: PACU  Anesthesia Type:General  Level of Consciousness: awake, drowsy and patient cooperative  Airway & Oxygen Therapy: Patient Spontanous Breathing and Patient connected to face mask oxygen  Post-op Assessment: Report given to RN and Post -op Vital signs reviewed and stable  Post vital signs: Reviewed and stable  Last Vitals:  Vitals Value Taken Time  BP 155/72 03/12/21 0839  Temp    Pulse 66 03/12/21 0840  Resp 16 03/12/21 0840  SpO2 100 % 03/12/21 0840  Vitals shown include unvalidated device data.  Last Pain:  Vitals:   03/12/21 0639  TempSrc: Temporal  PainSc: 0-No pain         Complications: No notable events documented.

## 2021-03-12 NOTE — Anesthesia Preprocedure Evaluation (Signed)
Anesthesia Evaluation  Patient identified by MRN, date of birth, ID band Patient awake    Reviewed: Allergy & Precautions, NPO status , Patient's Chart, lab work & pertinent test results  History of Anesthesia Complications Negative for: history of anesthetic complications  Airway Mallampati: III  TM Distance: >3 FB Neck ROM: Full    Dental no notable dental hx.    Pulmonary sleep apnea and Continuous Positive Airway Pressure Ventilation , neg COPD, former smoker,    breath sounds clear to auscultation- rhonchi (-) wheezing      Cardiovascular hypertension, Pt. on medications (-) CAD, (-) Past MI, (-) Cardiac Stents and (-) CABG  Rhythm:Regular Rate:Normal - Systolic murmurs and - Diastolic murmurs NM stress test 11/03/19: Regional wall motion: reveals normal myocardial thickening and wall  motion.  The overall quality of the study is good.   Artifacts noted: no  Left ventricular cavity: normal.     Neuro/Psych neg Seizures negative neurological ROS  negative psych ROS   GI/Hepatic Neg liver ROS, GERD  ,  Endo/Other  neg diabetesHypothyroidism   Renal/GU Renal disease: hx of nephrolithiasis.     Musculoskeletal negative musculoskeletal ROS (+)   Abdominal (+) - obese,   Peds  Hematology  (+) anemia ,   Anesthesia Other Findings Past Medical History: No date: Anemia No date: Aortic atherosclerosis (HCC) No date: Bell palsy No date: Bilateral renal cysts No date: Bladder tumor No date: CAD (coronary artery disease) No date: DOE (dyspnea on exertion) No date: GERD (gastroesophageal reflux disease) No date: History of angina No date: History of penile implant No date: History of prostate cancer No date: Hyperlipidemia No date: Hypertension No date: Hypothyroidism No date: Lipoma 02/2011: MRSA bacteremia No date: Nephrolithiasis No date: OSA on CPAP No date: Pneumonia     Comment:  as a teenager    Reproductive/Obstetrics                             Anesthesia Physical Anesthesia Plan  ASA: 3  Anesthesia Plan: General   Post-op Pain Management:    Induction: Intravenous  PONV Risk Score and Plan: 1 and Ondansetron and Dexamethasone  Airway Management Planned: LMA  Additional Equipment:   Intra-op Plan:   Post-operative Plan:   Informed Consent: I have reviewed the patients History and Physical, chart, labs and discussed the procedure including the risks, benefits and alternatives for the proposed anesthesia with the patient or authorized representative who has indicated his/her understanding and acceptance.     Dental advisory given  Plan Discussed with: CRNA and Anesthesiologist  Anesthesia Plan Comments:         Anesthesia Quick Evaluation

## 2021-03-12 NOTE — Interval H&P Note (Signed)
History and Physical Interval Note:  CV: RRR Lungs: Clear  03/12/2021 7:17 AM  Roger Cook  has presented today for surgery, with the diagnosis of Bladder Tumor.  The various methods of treatment have been discussed with the patient and family. After consideration of risks, benefits and other options for treatment, the patient has consented to  Procedure(s): TRANSURETHRAL RESECTION OF BLADDER TUMOR (TURBT) (N/A) as a surgical intervention.  The patient's history has been reviewed, patient examined, no change in status, stable for surgery.  I have reviewed the patient's chart and labs.  Questions were answered to the patient's satisfaction.     [place]

## 2021-03-12 NOTE — Discharge Instructions (Addendum)
AMBULATORY SURGERY  DISCHARGE INSTRUCTIONS   The drugs that you were given will stay in your system until tomorrow so for the next 24 hours you should not:  Drive an automobile Make any legal decisions Drink any alcoholic beverage   You may resume regular meals tomorrow.  Today it is better to start with liquids and gradually work up to solid foods.  You may eat anything you prefer, but it is better to start with liquids, then soup and crackers, and gradually work up to solid foods.   Please notify your doctor immediately if you have any unusual bleeding, trouble breathing, redness and pain at the surgery site, drainage, fever, or pain not relieved by medication.     Your post-operative visit with Dr.                                       is: Date:                        Time:    Please call to schedule your post-operative visit.  Additional Instructions:    Transurethral Resection of Bladder Tumor (TURBT) or Bladder Biopsy   Definition:  Transurethral Resection of the Bladder Tumor is a surgical procedure used to diagnose and remove tumors within the bladder. T  General instructions:     Your recent bladder surgery requires very little post hospital care but some definite precautions.  Despite the fact that no skin incisions were used, the area around the bladder incisions are raw and covered with scabs to promote healing and prevent bleeding. Certain precautions are needed to insure that the scabs are not disturbed over the next 2-4 weeks while the healing proceeds.  Because the raw surface inside your bladder and the irritating effects of urine you may expect frequency of urination and/or urgency (a stronger desire to urinate) and perhaps even getting up at night more often. This will usually resolve or improve slowly over the healing period. You may see some blood in your urine over the first 6 weeks. Do not be alarmed, even if the urine was clear for a while. Get off  your feet and drink lots of fluids until clearing occurs. If you start to pass clots or don't improve call us.  Diet:  You may return to your normal diet immediately. Because of the raw surface of your bladder, alcohol, spicy foods, foods high in acid and drinks with caffeine may cause irritation or frequency and should be used in moderation. To keep your urine flowing freely and avoid constipation, drink plenty of fluids during the day (8-10 glasses). Tip: Avoid cranberry juice because it is very acidic.  Activity:  Your physical activity doesn't need to be restricted. However, if you are very active, you may see some blood in the urine. We suggest that you reduce your activity under the circumstances until the bleeding has stopped.  Bowels:  It is important to keep your bowels regular during the postoperative period. Straining with bowel movements can cause bleeding. A bowel movement every other day is reasonable. Use a mild laxative if needed, such as milk of magnesia 2-3 tablespoons, or 2 Dulcolax tablets. Call if you continue to have problems. If you had been taking narcotics for pain, before, during or after your surgery, you may be constipated. Take a laxative if necessary.    Medication:  You should resume your pre-surgery medications unless told not to. In addition you may be given an antibiotic to prevent or treat infection. Antibiotics are not always necessary. All medication should be taken as prescribed until the bottles are finished unless you are having an unusual reaction to one of the drugs.  You may resume aspirin 03/13/2021 if urine is clear-pink   [facility] [address] [place], [place] [address] [phone]

## 2021-03-13 ENCOUNTER — Telehealth: Payer: Self-pay

## 2021-03-13 NOTE — Telephone Encounter (Signed)
Patient called stating that he is having blood in his urine post TURBT 03/12/21. He was reassured that this is to be expected post op and my continue for a week or so. He was encouraged to drink plenty of water. Food color reference was given as example for blood in urine. If he starts to pass thick ketchup like blood/urine or develops fever, chills, nausea or vomiting he should contact the office. Otherwise keep follow up as scheduled. He verbalized understanding

## 2021-03-18 ENCOUNTER — Other Ambulatory Visit: Payer: Self-pay | Admitting: Family Medicine

## 2021-03-18 DIAGNOSIS — R413 Other amnesia: Secondary | ICD-10-CM

## 2021-03-20 DIAGNOSIS — N329 Bladder disorder, unspecified: Secondary | ICD-10-CM | POA: Diagnosis not present

## 2021-03-26 LAB — SURGICAL PATHOLOGY

## 2021-03-28 ENCOUNTER — Telehealth: Payer: Self-pay | Admitting: *Deleted

## 2021-03-28 NOTE — Telephone Encounter (Signed)
-----   Message from Abbie Sons, MD sent at 03/28/2021  7:15 AM EDT ----- Please let patient know his bladder pathology slides were sent to [facility] for a second opinion and the final result shows no cancer but benign inflammation.  Keep scheduled follow-up later this month.

## 2021-03-28 NOTE — Telephone Encounter (Signed)
Patient returned call and was advised of message from Dr. Bernardo Heater.  He was pleased and plans to keep his follow up appointment.

## 2021-04-08 ENCOUNTER — Encounter: Payer: Self-pay | Admitting: Emergency Medicine

## 2021-04-08 ENCOUNTER — Emergency Department
Admission: EM | Admit: 2021-04-08 | Discharge: 2021-04-08 | Disposition: A | Discharge status: Home / Self Care | Payer: Medicare Other | Attending: Emergency Medicine | Admitting: Emergency Medicine

## 2021-04-08 ENCOUNTER — Emergency Department: Payer: Medicare Other

## 2021-04-08 ENCOUNTER — Other Ambulatory Visit: Payer: Self-pay

## 2021-04-08 DIAGNOSIS — I2510 Atherosclerotic heart disease of native coronary artery without angina pectoris: Secondary | ICD-10-CM | POA: Insufficient documentation

## 2021-04-08 DIAGNOSIS — Z79899 Other long term (current) drug therapy: Secondary | ICD-10-CM | POA: Insufficient documentation

## 2021-04-08 DIAGNOSIS — E039 Hypothyroidism, unspecified: Secondary | ICD-10-CM | POA: Insufficient documentation

## 2021-04-08 DIAGNOSIS — I10 Essential (primary) hypertension: Secondary | ICD-10-CM | POA: Insufficient documentation

## 2021-04-08 DIAGNOSIS — Z7982 Long term (current) use of aspirin: Secondary | ICD-10-CM | POA: Insufficient documentation

## 2021-04-08 DIAGNOSIS — Z8546 Personal history of malignant neoplasm of prostate: Secondary | ICD-10-CM | POA: Insufficient documentation

## 2021-04-08 DIAGNOSIS — I700 Atherosclerosis of aorta: Secondary | ICD-10-CM | POA: Diagnosis not present

## 2021-04-08 DIAGNOSIS — R55 Syncope and collapse: Secondary | ICD-10-CM | POA: Diagnosis not present

## 2021-04-08 DIAGNOSIS — I200 Unstable angina: Secondary | ICD-10-CM | POA: Insufficient documentation

## 2021-04-08 DIAGNOSIS — Z87891 Personal history of nicotine dependence: Secondary | ICD-10-CM | POA: Insufficient documentation

## 2021-04-08 DIAGNOSIS — R61 Generalized hyperhidrosis: Secondary | ICD-10-CM | POA: Diagnosis not present

## 2021-04-08 DIAGNOSIS — R918 Other nonspecific abnormal finding of lung field: Secondary | ICD-10-CM | POA: Diagnosis not present

## 2021-04-08 DIAGNOSIS — W19XXXA Unspecified fall, initial encounter: Secondary | ICD-10-CM | POA: Diagnosis not present

## 2021-04-08 DIAGNOSIS — R4020 Unspecified coma: Secondary | ICD-10-CM | POA: Diagnosis not present

## 2021-04-08 DIAGNOSIS — I959 Hypotension, unspecified: Secondary | ICD-10-CM | POA: Diagnosis not present

## 2021-04-08 LAB — COMPREHENSIVE METABOLIC PANEL
ALT: 22 U/L (ref 0–44)
AST: 30 U/L (ref 15–41)
Albumin: 3.3 g/dL — ABNORMAL LOW (ref 3.5–5.0)
Alkaline Phosphatase: 48 U/L (ref 38–126)
Anion gap: 7 (ref 5–15)
BUN: 28 mg/dL — ABNORMAL HIGH (ref 8–23)
CO2: 24 mmol/L (ref 22–32)
Calcium: 8.3 mg/dL — ABNORMAL LOW (ref 8.9–10.3)
Chloride: 106 mmol/L (ref 98–111)
Creatinine, Ser: 1.59 mg/dL — ABNORMAL HIGH (ref 0.61–1.24)
GFR, Estimated: 41 mL/min — ABNORMAL LOW (ref >60)
Glucose, Bld: 98 mg/dL (ref 70–99)
Potassium: 4.4 mmol/L (ref 3.5–5.1)
Sodium: 137 mmol/L (ref 135–145)
Total Bilirubin: 0.5 mg/dL (ref 0.3–1.2)
Total Protein: 6.6 g/dL (ref 6.5–8.1)

## 2021-04-08 LAB — CBC WITH DIFFERENTIAL/PLATELET
Abs Immature Granulocytes: 0.03 10*3/uL (ref 0.00–0.07)
Basophils Absolute: 0 10*3/uL (ref 0.0–0.1)
Basophils Relative: 0 %
Eosinophils Absolute: 0.1 10*3/uL (ref 0.0–0.5)
Eosinophils Relative: 2 %
HCT: 25.4 % — ABNORMAL LOW (ref 39.0–52.0)
Hemoglobin: 8 g/dL — ABNORMAL LOW (ref 13.0–17.0)
Immature Granulocytes: 0 %
Lymphocytes Relative: 25 %
Lymphs Abs: 1.7 10*3/uL (ref 0.7–4.0)
MCH: 26.1 pg (ref 26.0–34.0)
MCHC: 31.5 g/dL (ref 30.0–36.0)
MCV: 83 fL (ref 80.0–100.0)
Monocytes Absolute: 0.8 10*3/uL (ref 0.1–1.0)
Monocytes Relative: 11 %
Neutro Abs: 4.4 10*3/uL (ref 1.7–7.7)
Neutrophils Relative %: 62 %
Platelets: 264 10*3/uL (ref 150–400)
RBC: 3.06 MIL/uL — ABNORMAL LOW (ref 4.22–5.81)
RDW: 17.4 % — ABNORMAL HIGH (ref 11.5–15.5)
WBC: 7.1 10*3/uL (ref 4.0–10.5)
nRBC: 0 % (ref 0.0–0.2)

## 2021-04-08 LAB — TROPONIN I (HIGH SENSITIVITY): Troponin I (High Sensitivity): 4 ng/L (ref <18)

## 2021-04-08 LAB — BRAIN NATRIURETIC PEPTIDE: B Natriuretic Peptide: 97.7 pg/mL (ref 0.0–100.0)

## 2021-04-08 MED ORDER — SODIUM CHLORIDE 0.9 % IV BOLUS
500.0000 mL | Freq: Once | INTRAVENOUS | Status: AC
  Administered 2021-04-08: 500 mL via INTRAVENOUS

## 2021-04-08 NOTE — ED Triage Notes (Signed)
Pt ems from church. Pt was standing and had a syncopal or near syncopal event. Per ems pt was caught and did not hit floor. Pt A/O here.

## 2021-04-08 NOTE — ED Provider Notes (Addendum)
[facility] Emergency Department Provider Note   ____________________________________________   Event Date/Time   First MD Initiated Contact with Patient 04/08/21 1331     (approximate)  I have reviewed the triage vital signs and the nursing notes.   HISTORY  Chief Complaint Loss of Consciousness    HPI Roger Cook is a 87 y.o. male with below stated past medical history who presents via EMS after syncopal episode at church.  Patient states that he was at a funeral just prior to arrival and felt that he was getting lightheaded before kneeling down and laying down on the ground.  Patient denies any loss of consciousness but does state that he got extremely lightheaded.  Per EMS, patient initially looked rather gaunt and had some lower blood pressure measurements however patient significantly improved after 500 cc of crystalloid was given and patient currently has no complaints.  Patient currently denies any vision changes, tinnitus, difficulty speaking, facial droop, sore throat, chest pain, shortness of breath, abdominal pain, nausea/vomiting/diarrhea, dysuria, or weakness/numbness/paresthesias in any extremity          Past Medical History:  Diagnosis Date   Anemia    Aortic atherosclerosis (HCC)    Bell palsy    Bilateral renal cysts    Bladder tumor    CAD (coronary artery disease)    DOE (dyspnea on exertion)    GERD (gastroesophageal reflux disease)    History of angina    History of penile implant    History of prostate cancer    Hyperlipidemia    Hypertension    Hypothyroidism    Lipoma    MRSA bacteremia 02/2011   Nephrolithiasis    OSA on CPAP    Pneumonia    as a teenager    Patient Active Problem List   Diagnosis Date Noted   Angina pectoris ([place]) 09/18/2020   Memory problem 09/18/2020   Renal hematoma, right, subsequent encounter 04/25/2018   Localized osteoporosis of spine 12/23/2017   Constipation 11/06/2016   Bell's  palsy 02/26/2015   Nephrogenic adenoma of bladder 76/72/0947   Folliculitis 09/62/8366   Personal history of methicillin resistant Staphylococcus aureus 02/26/2015   Fatty tumor 02/26/2015   Syncope and collapse 02/26/2015   Fungal infection of nail 02/26/2015   H/O malignant neoplasm of prostate 02/26/2015   Altered blood in stool 10/18/2012   Arteriosclerosis of coronary artery 01/05/2009   Severe obstructive sleep apnea 01/05/2009   Hypothyroidism 01/05/2009   Essential hypertension 09/22/1998    Past Surgical History:  Procedure Laterality Date   APPENDECTOMY  2005   CARDIAC CATHETERIZATION N/A 08/13/1990   75% pLCx, 75% mLCx, 25% mLAD, 75% D2, 50% right renal artery; Location: [facility]; Surgeon: Doristine Bosworth, MD   CATARACT EXTRACTION  2005   also had a macular hole in 2005   CATARACT EXTRACTION  2008   COLONOSCOPY     2003, 2014   ESOPHAGOGASTRODUODENOSCOPY     2004, 2014   HAND SURGERY Left 06/26/2011   Malignancy removed from left hand   [place]   penile inplant  1984   polyp of rectum  2003   [place]   Abdominal, had to have radiation treatment with the procedure   [place] TUMOR N/A 03/12/2021   Procedure: TRANSURETHRAL RESECTION OF BLADDER TUMOR (TURBT);  Surgeon: John Giovanni  C, MD;  Location: [facility] ORS;  Service: Urology;  Laterality: N/A;    Prior to Admission medications   Medication Sig Start Date End Date Taking? Authorizing Provider  ARTIFICIAL TEAR SOLUTION OP Place 1 drop into both eyes 2 (two) times daily as needed (dry eyes).    [provider]  aspirin 81 MG tablet Take 81 mg by mouth daily.  01/05/09   [provider]  brimonidine (ALPHAGAN) 0.2 % ophthalmic solution Place 1 drop into the left eye 2 (two) times daily. 01/25/21   [provider]  donepezil (ARICEPT) 10 MG tablet TAKE 1 TABLET BY MOUTH  AT BEDTIME 03/18/21   [name], MD  fluticasone (FLONASE) 50 MCG/ACT nasal spray PLACE 1-2 SPRAYS IN EACH NOSTRIL EVERY DAY AS NEEDED Patient taking differently: Place 2 sprays into both nostrils daily as needed for allergies. 10/03/20   [name], MD  isosorbide mononitrate (IMDUR) 60 MG 24 hr tablet Take 60 mg by mouth daily.    [provider]  levothyroxine (SYNTHROID) 88 MCG tablet TAKE 1 TABLET BY MOUTH DAILY Patient taking differently: Take 88 mcg by mouth daily before breakfast. 05/07/20   [name], MD  metoprolol succinate (TOPROL-XL) 25 MG 24 hr tablet Take 12.5 mg by mouth daily. 01/25/21   [provider]  MULTIPLE VITAMIN PO Take 1 tablet by mouth daily.    [provider]  NIFEdipine (PROCARDIA XL/NIFEDICAL XL) 60 MG 24 hr tablet TAKE ONE TABLET EVERY DAY Patient taking differently: Take 60 mg by mouth at bedtime. 04/09/20   [name], MD  NON FORMULARY CPAP (Free Text) - Historical Medication  As directed  Started 22-Sep-1994 Active 09/22/1994   [provider]  pravastatin (PRAVACHOL) 40 MG tablet TAKE 1 TABLET BY MOUTH DAILY Patient taking differently: Take 40 mg by mouth at bedtime. 11/19/20   [name], MD  PREVIDENT 5000 DRY MOUTH 1.1 % GEL dental gel Place 1 application onto teeth 2 (two) times daily. 02/19/21   [provider]    Allergies Levofloxacin, Povidone iodine, Sulfa antibiotics, and Benadryl [diphenhydramine]  Family History  Problem Relation Age of Onset   Lung cancer Mother    Heart disease Father     Social History Social History   Tobacco Use   Smoking status: Former    Packs/day: 1.00    Years: 16.00    Pack years: 16.00    Types: Cigarettes    Quit date: 1964    Years since quitting: 58.5   Smokeless tobacco: Never  Vaping Use   Vaping Use: Never used  Substance Use Topics   Alcohol use: Yes    Alcohol/week: 2.0 standard drinks    Types: 2 Glasses of wine per week    Drug use: No    Review of Systems Constitutional: No fever/chills Eyes: No visual changes. ENT: No sore throat. Cardiovascular: Denies chest pain. Respiratory: Denies shortness of breath. Gastrointestinal: No abdominal pain.  No nausea, no vomiting.  No diarrhea. Genitourinary: Negative for dysuria. Musculoskeletal: Negative for acute arthralgias Skin: Negative for rash. Neurological: Negative for headaches, weakness/numbness/paresthesias in any extremity Psychiatric: Negative for suicidal ideation/homicidal ideation   ____________________________________________   PHYSICAL EXAM:  VITAL SIGNS: ED Triage Vitals  Enc Vitals Group     BP 04/08/21 1339 (!) 126/55     Pulse Rate 04/08/21 1339 63     Resp 04/08/21 1339 16     Temp 04/08/21 1339 97.6 F (36.4 C)  Temp Source 04/08/21 1339 Oral     SpO2 04/08/21 1339 99 %     Weight 04/08/21 1340 158 lb 11.7 oz (72 kg)     Height --      Head Circumference --      Peak Flow --      Pain Score 04/08/21 1340 0     Pain Loc --      Pain Edu? --      Excl. in [place]? --    Constitutional: Alert and oriented. Well appearing and in no acute distress. Eyes: Conjunctivae are normal. PERRL. Head: Atraumatic. Nose: No congestion/rhinnorhea. Mouth/Throat: Mucous membranes are moist. Neck: No stridor Cardiovascular: Grossly normal heart sounds.  Good peripheral circulation. Respiratory: Normal respiratory effort.  No retractions. Gastrointestinal: Soft and nontender. No distention. Musculoskeletal: No obvious deformities Neurologic:  Normal speech and language. No gross focal neurologic deficits are appreciated. Skin:  Skin is warm and dry. No rash noted. Psychiatric: Mood and affect are normal. Speech and behavior are normal.  ____________________________________________   LABS (all labs ordered are listed, but only abnormal results are displayed)  Labs Reviewed  CBC WITH DIFFERENTIAL/PLATELET - Abnormal; Notable for the  following components:      Result Value   RBC 3.06 (*)    Hemoglobin 8.0 (*)    HCT 25.4 (*)    RDW 17.4 (*)    All other components within normal limits  COMPREHENSIVE METABOLIC PANEL  BRAIN NATRIURETIC PEPTIDE  TROPONIN I (HIGH SENSITIVITY)   ____________________________________________  EKG  ED ECG REPORT I, Naaman Plummer, the attending physician, personally viewed and interpreted this ECG.  Date: 04/08/2021 EKG Time: 1340 Rate: 66 Rhythm: normal sinus rhythm QRS Axis: normal Intervals: normal ST/T Wave abnormalities: normal Narrative Interpretation: no evidence of acute ischemia   PROCEDURES  Procedure(s) performed (including Critical Care):  Procedures   ____________________________________________   INITIAL IMPRESSION / ASSESSMENT AND PLAN / ED COURSE  As part of my medical decision making, I reviewed the following data within the electronic medical record, if available:  Nursing notes reviewed and incorporated, Labs reviewed, EKG interpreted, Old chart reviewed, Radiograph reviewed and Notes from prior ED visits reviewed and incorporated        Patient presents with complaints of syncope/presyncope ED Workup:  CBC, BMP, Troponin, BNP, ECG, CXR Differential diagnosis includes HF, ICH, seizure, stroke, HOCM, ACS, aortic dissection, malignant arrhythmia, or GI bleed. Findings: No evidence of acute laboratory abnormalities.  Troponin negative x1 EKG: No e/o STEMI. No evidence of Brugadas sign, delta wave, epsilon wave, significantly prolonged QTc, or malignant arrhythmia.  Disposition: Discharge home with PCP follow-up      ____________________________________________   FINAL CLINICAL IMPRESSION(S) / ED DIAGNOSES  Final diagnoses:  Near syncope     ED Discharge Orders     None        Note:  This document was prepared using Dragon voice recognition software and may include unintentional dictation errors.    Naaman Plummer,  MD 04/08/21 1426    Naaman Plummer, MD 04/08/21 1451

## 2021-04-09 ENCOUNTER — Encounter: Payer: Self-pay | Admitting: Family Medicine

## 2021-04-09 ENCOUNTER — Ambulatory Visit (INDEPENDENT_AMBULATORY_CARE_PROVIDER_SITE_OTHER): Payer: Medicare Other | Admitting: Family Medicine

## 2021-04-09 VITALS — BP 115/43 | HR 57 | Wt 151.0 lb

## 2021-04-09 DIAGNOSIS — I209 Angina pectoris, unspecified: Secondary | ICD-10-CM | POA: Diagnosis not present

## 2021-04-09 DIAGNOSIS — F0390 Unspecified dementia without behavioral disturbance: Secondary | ICD-10-CM

## 2021-04-09 DIAGNOSIS — I10 Essential (primary) hypertension: Secondary | ICD-10-CM | POA: Diagnosis not present

## 2021-04-09 MED ORDER — NIFEDIPINE ER OSMOTIC RELEASE 30 MG PO TB24
30.0000 mg | ORAL_TABLET | Freq: Every day | ORAL | 1 refills | Status: DC
Start: 2021-04-09 — End: 2021-06-11

## 2021-04-09 NOTE — Progress Notes (Signed)
Established patient visit   Patient: Roger Cook   DOB: 1926-10-24   87 y.o. Male  MRN: [id number] Visit Date: 04/09/2021  Today's healthcare provider: Lelon Huh, MD   Chief Complaint  Patient presents with   Follow-up   Subjective    HPI  Follow up ER visit  Patient was seen in ER for near syncope on 04/08/2021.      ER work up showed no evidence of acute laboratory abnormalities.Troponin negative x1      EKG: No e/o STEMI. No evidence of Brugadas sign, delta wave, epsilon wave, significantly prolonged QTc, or malignant arrhythmia. Patient was discharged home. He reports excellent compliance with treatment. He reports this condition is Improved.  -----------------------------------------------------------------------------------------   Follow up for Dementia:  The patient was last seen for this 3 months ago. Changes made at last visit include adding Namenda.  He reports excellent compliance with treatment. He feels that condition is Improved. He is not having side effects.   -----------------------------------------------------------------------------------------     Medications: Outpatient Medications Prior to Visit  Medication Sig   Apoaequorin (PREVAGEN) 10 MG CAPS Take by mouth.   ARTIFICIAL TEAR SOLUTION OP Place 1 drop into both eyes 2 (two) times daily as needed (dry eyes).   aspirin 81 MG tablet Take 81 mg by mouth daily.    brimonidine (ALPHAGAN) 0.2 % ophthalmic solution Place 1 drop into the left eye 2 (two) times daily.   Calcium Carbonate-Vit D-Min (CALCIUM 1200 PO) Take by mouth.   donepezil (ARICEPT) 10 MG tablet TAKE 1 TABLET BY MOUTH AT BEDTIME   fluticasone (FLONASE) 50 MCG/ACT nasal spray PLACE 1-2 SPRAYS IN EACH NOSTRIL EVERY DAY AS NEEDED (Patient taking differently: Place 2 sprays into both nostrils daily as needed for allergies.)   isosorbide mononitrate (IMDUR) 60 MG 24 hr tablet Take 60 mg by mouth daily.   levothyroxine  (SYNTHROID) 88 MCG tablet TAKE 1 TABLET BY MOUTH DAILY (Patient taking differently: Take 88 mcg by mouth daily before breakfast.)   metoprolol succinate (TOPROL-XL) 25 MG 24 hr tablet Take 12.5 mg by mouth daily.   MULTIPLE VITAMIN PO Take 1 tablet by mouth daily.   NIFEdipine (PROCARDIA XL/NIFEDICAL XL) 60 MG 24 hr tablet TAKE ONE TABLET EVERY DAY (Patient taking differently: Take 60 mg by mouth at bedtime.)   NON FORMULARY CPAP (Free Text) - Historical Medication  As directed  Started 22-Sep-1994 Active   pravastatin (PRAVACHOL) 40 MG tablet TAKE 1 TABLET BY MOUTH DAILY (Patient taking differently: Take 40 mg by mouth at bedtime.)   PREVIDENT 5000 DRY MOUTH 1.1 % GEL dental gel Place 1 application onto teeth 2 (two) times daily.   No facility-administered medications prior to visit.    Review of Systems  Constitutional: Negative.   Respiratory: Negative.    Cardiovascular: Negative.   Gastrointestinal: Negative.   Neurological:  Negative for dizziness, light-headedness and headaches.      Objective    BP (!) 115/43 (BP Location: Right Arm, Patient Position: Sitting, Cuff Size: Normal)   Pulse (!) 57   Wt 151 lb (68.5 kg)   SpO2 100%   BMI 23.65 kg/m     Physical Exam   General: Appearance:    Well developed, well nourished male in no acute distress  Eyes:    PERRL, conjunctiva/corneas clear, EOM's intact       Lungs:     Clear to auscultation bilaterally, respirations unlabored  Heart:    Bradycardic. Normal  rhythm. No murmurs, rubs, or gallops.    MS:   All extremities are intact.    Neurologic:   Awake, alert, oriented. Knows month and day of week. Not sure of date. Guessed 2021 as year. No apparent focal neurological defect.         Assessment & Plan     1. Dementia without behavioral disturbance, unspecified dementia type ([place]) Doing well with addition of Namenda to Aricept. Knows the day of week which he did not know at last visit. Is much closer to knowing accurate  date compared to last visit. Is following questions and conversation better than he did at last visit. Will continue current medications.   2. Essential hypertension Is a bit hypotensive which may have contributed to recent syncopal spell. Reduce from 60mg  to  NIFEdipine (PROCARDIA-XL/NIFEDICAL-XL) 30 MG 24 hr tablet; Take 1 tablet (30 mg total) by mouth daily.  Dispense: 90 tablet; Refill: 1  Future Appointments  Date Time Provider [place]  04/11/2021  1:30 PM Abbie Sons, MD [id number] None  06/11/2021 11:00 AM [name], MD [id number]        The entirety of the information documented in the History of Present Illness, Review of Systems and Physical Exam were personally obtained by me. Portions of this information were initially documented by the CMA and reviewed by me for thoroughness and accuracy.     Lelon Huh, MD  [facility] [phone] (phone) [phone] (fax)  [place]

## 2021-04-09 NOTE — Patient Instructions (Signed)
Please review the attached list of medications and notify my office if there are any errors.   We are changing the dose of nifedipine to 30mg  due to your blood pressure being a little low

## 2021-04-11 ENCOUNTER — Ambulatory Visit (INDEPENDENT_AMBULATORY_CARE_PROVIDER_SITE_OTHER): Payer: Medicare Other | Admitting: Urology

## 2021-04-11 ENCOUNTER — Other Ambulatory Visit: Payer: Self-pay

## 2021-04-11 ENCOUNTER — Encounter: Payer: Self-pay | Admitting: Urology

## 2021-04-11 VITALS — BP 124/58 | HR 67 | Ht 71.0 in | Wt 150.0 lb

## 2021-04-11 DIAGNOSIS — Z09 Encounter for follow-up examination after completed treatment for conditions other than malignant neoplasm: Secondary | ICD-10-CM

## 2021-04-13 ENCOUNTER — Encounter: Payer: Self-pay | Admitting: Urology

## 2021-04-13 NOTE — Progress Notes (Signed)
04/11/2021 3:29 PM   Roger Cook 1927-06-18 [id number]  Referring provider: [name], MD [address] [place],  [place] [address]  Chief Complaint  Patient presents with   Other    HPI: 87 y.o. male presents for postop follow-up.  Prior history of radical prostatectomy with postop radiation in the 1980s Developed recurrent gross hematuria and CT urogram showed no upper tract abnormalities Office cystoscopy remarkable for a left lateral wall bladder lesion Underwent cystoscopy under anesthesia and TURBT 03/12/2021 with findings of 4 calcified/keratinized lesions measuring between 1-2 cm All were resected and the pathology was sent out to [facility] returned benign urothelium with reactive epithelial changes with acute/chronic inflammation and areas of necrosis and calcification He presently is doing well and denies recurrent gross hematuria   PMH: Past Medical History:  Diagnosis Date   Anemia    Aortic atherosclerosis (HCC)    Bell palsy    Bilateral renal cysts    Bladder tumor    CAD (coronary artery disease)    DOE (dyspnea on exertion)    GERD (gastroesophageal reflux disease)    History of angina    History of penile implant    History of prostate cancer    Hyperlipidemia    Hypertension    Hypothyroidism    Lipoma    MRSA bacteremia 02/2011   Nephrolithiasis    OSA on CPAP    Pneumonia    as a teenager    Surgical History: Past Surgical History:  Procedure Laterality Date   APPENDECTOMY  2005   CARDIAC CATHETERIZATION N/A 08/13/1990   75% pLCx, 75% mLCx, 25% mLAD, 75% D2, 50% right renal artery; Location: [facility]; Surgeon: Doristine Bosworth, MD   CATARACT EXTRACTION  2005   also had a macular hole in 2005   CATARACT EXTRACTION  2008   COLONOSCOPY     2003, 2014   ESOPHAGOGASTRODUODENOSCOPY     2004, 2014   HAND SURGERY Left 06/26/2011   Malignancy removed from left hand   [place]   penile inplant  1984   polyp of rectum  2003   [place]   Abdominal, had to have radiation treatment with the procedure   [place] TUMOR N/A 03/12/2021   Procedure: TRANSURETHRAL RESECTION OF BLADDER TUMOR (TURBT);  Surgeon: Abbie Sons, MD;  Location: [facility];  Service: Urology;  Laterality: N/A;    Home Medications:  Allergies as of 04/11/2021       Reactions   Levofloxacin Swelling   lips swelling   Povidone Iodine    Unknown, can not remember   Sulfa Antibiotics    Unknown, can not remember   Benadryl [diphenhydramine] Rash        Medication List        Accurate as of April 11, 2021 11:59 PM. If you have any questions, ask your nurse or doctor.          ARTIFICIAL TEAR SOLUTION OP Place 1 drop into both eyes 2 (two) times daily as needed (dry eyes).   aspirin 81 MG tablet Take 81 mg by mouth daily.   brimonidine 0.2 % ophthalmic solution Commonly known as: ALPHAGAN Place 1 drop into the left eye 2 (two) times daily.   CALCIUM 1200 PO Take by mouth.   donepezil 10 MG tablet  Commonly known as: ARICEPT TAKE 1 TABLET BY MOUTH AT BEDTIME   fluticasone 50 MCG/ACT nasal spray Commonly known as: FLONASE PLACE 1-2 SPRAYS IN EACH NOSTRIL EVERY DAY AS NEEDED What changed: See the new instructions.   isosorbide mononitrate 60 MG 24 hr tablet Commonly known as: IMDUR Take 60 mg by mouth daily.   levothyroxine 88 MCG tablet Commonly known as: SYNTHROID TAKE 1 TABLET BY MOUTH DAILY What changed: when to take this   metoprolol succinate 25 MG 24 hr tablet Commonly known as: TOPROL-XL Take 12.5 mg by mouth daily.   MULTIPLE VITAMIN PO Take 1 tablet by mouth daily.   NIFEdipine 30 MG 24 hr tablet Commonly known as: PROCARDIA-XL/NIFEDICAL-XL Take 1 tablet (30 mg total) by mouth daily.   NON FORMULARY CPAP (Free Text) - Historical Medication  As directed   Started 22-Sep-1994 Active   pravastatin 40 MG tablet Commonly known as: PRAVACHOL TAKE 1 TABLET BY MOUTH DAILY What changed: when to take this   Prevagen 10 MG Caps Generic drug: Apoaequorin Take by mouth.   PreviDent 5000 Dry Mouth 1.1 % Gel dental gel Generic drug: sodium fluoride Place 1 application onto teeth 2 (two) times daily.        Allergies:  Allergies  Allergen Reactions   Levofloxacin Swelling    lips swelling   Povidone Iodine     Unknown, can not remember   Sulfa Antibiotics     Unknown, can not remember   Benadryl [Diphenhydramine] Rash    Family History: Family History  Problem Relation Age of Onset   Lung cancer Mother    Heart disease Father     Social History:  reports that he quit smoking about 58 years ago. His smoking use included cigarettes. He has a 16.00 pack-year smoking history. He has never used smokeless tobacco. He reports current alcohol use of about 2.0 standard drinks of alcohol per week. He reports that he does not use drugs.   Physical Exam: BP (!) 124/58   Pulse 67   Ht '5\' 11"'$  (1.803 m)   Wt 150 lb (68 kg)   BMI 20.92 kg/m   Constitutional:  Alert and oriented, No acute distress. HEENT: [place] AT, moist mucus membranes.  Trachea midline, no masses. Cardiovascular: No clubbing, cyanosis, or edema. Respiratory: Normal respiratory effort, no increased work of breathing.   Assessment & Plan:   Status post resection of inflammatory bladder lesions No complaints today and hematuria has resolved Follow-up 1 year instructed to call earlier for recurrent gross hematuria   Abbie Sons, MD  [place] [address] [place], [place] [address] [phone]

## 2021-04-16 ENCOUNTER — Ambulatory Visit: Payer: Self-pay | Admitting: *Deleted

## 2021-04-16 NOTE — Telephone Encounter (Signed)
Reason for Disposition . MILD-MODERATE diarrhea (e.g., 1-6 times / day more than normal)  Answer Assessment - Initial Assessment Questions 1. DIARRHEA SEVERITY: "How bad is the diarrhea?" "How many more stools have you had in the past 24 hours than normal?"    - NO DIARRHEA (SCALE 0)   - MILD (SCALE 1-3): Few loose or mushy BMs; increase of 1-3 stools over normal daily number of stools; mild increase in ostomy output.   -  MODERATE (SCALE 4-7): Increase of 4-6 stools daily over normal; moderate increase in ostomy output. * SEVERE (SCALE 8-10; OR 'WORST POSSIBLE'): Increase of 7 or more stools daily over normal; moderate increase in ostomy output; incontinence.     moderate 2. ONSET: "When did the diarrhea begin?"      Last night 3. BM CONSISTENCY: "How loose or watery is the diarrhea?"      loose 4. VOMITING: "Are you also vomiting?" If Yes, ask: "How many times in the past 24 hours?"      no 5. ABDOMINAL PAIN: "Are you having any abdominal pain?" If Yes, ask: "What does it feel like?" (e.g., crampy, dull, intermittent, constant)      no 6. ABDOMINAL PAIN SEVERITY: If present, ask: "How bad is the pain?"  (e.g., Scale 1-10; mild, moderate, or severe)   - MILD (1-3): doesn't interfere with normal activities, abdomen soft and not tender to touch    - MODERATE (4-7): interferes with normal activities or awakens from sleep, abdomen tender to touch    - SEVERE (8-10): excruciating pain, doubled over, unable to do any normal activities       none 7. ORAL INTAKE: If vomiting, "Have you been able to drink liquids?" "How much liquids have you had in the past 24 hours?"     no 8. HYDRATION: "Any signs of dehydration?" (e.g., dry mouth [not just dry lips], too weak to stand, dizziness, new weight loss) "When did you last urinate?"     Patient is trying to stay hydrated- 8 glasses of water yesterday 9. EXPOSURE: "Have you traveled to a foreign country recently?" "Have you been exposed to anyone with  diarrhea?" "Could you have eaten any food that was spoiled?"     no 10. ANTIBIOTIC USE: "Are you taking antibiotics now or have you taken antibiotics in the past 2 months?"       no 11. OTHER SYMPTOMS: "Do you have any other symptoms?" (e.g., fever, blood in stool)       no 12. PREGNANCY: "Is there any chance you are pregnant?" "When was your last menstrual period?"       N/a  Protocols used: [facility]

## 2021-04-16 NOTE — Telephone Encounter (Signed)
Patient is calling to report he started having diarrhea last night- he is calling to ask what to use for that. Advised patient per protocol- he will continue to hydrate, try bland diet, use OTC- will clear with pharmacist to make sure OTC medication is safe with Rx. Patient to call back if symptoms do not improve. Also informed patient that diarrhea can be symptom of COVID and advised home testing. Patient to call back if test returns a positive result.

## 2021-04-22 ENCOUNTER — Telehealth: Payer: Self-pay

## 2021-04-22 DIAGNOSIS — R197 Diarrhea, unspecified: Secondary | ICD-10-CM

## 2021-04-22 NOTE — Telephone Encounter (Signed)
Need more info please. He was just to here to follow up on Aricept that was prescribed to help with memory which he thought was working.

## 2021-04-22 NOTE — Telephone Encounter (Signed)
LMTCB 04/22/2021.  PEC please get more info when Ms. Wellborn calls back.   Thanks,   -Mickel Baas

## 2021-04-22 NOTE — Telephone Encounter (Signed)
Ms. Edwina Barth reports that pt has been having issues with diarrhea since his bladder surgery 03/12/2021.  He has had several accidents as well.  She denies bloody black/tarry stools.  I advised her that Mr. Yeley would need an office visit to discuss his symptoms (He did not mention it at his last visit).  She states she will talk to her step mother about it and see if they can get him to come in sooner than 06/11/2021.    Thanks,   -Mickel Baas

## 2021-04-22 NOTE — Telephone Encounter (Signed)
Copied from [place] [id number]. Topic: General - Other >> Apr 22, 2021 10:53 AM Loma [place] wrote: Reason for CRM: PT daughter is very concerned about her father. Her step- mother is at her wits end and there are changes with pt that daughter feels as if she must step-up. States something is really going on with her father and she would like Dr Caryn Section to reach out as they do not know what to do... Call Alton Revere at [phone]. She will be free all day if someone could reach out to her. Father just had and appt less than 2 wks ago but is not forthcoming and do not even know what he told Dr Caryn Section. FU with pt daughter at [phone]

## 2021-04-22 NOTE — Telephone Encounter (Signed)
He may have developed c.diff. he needs to get stool studies done. Please advise and leave order at lab

## 2021-04-23 NOTE — Telephone Encounter (Signed)
Ms. Edwina Barth states she is going to come by this afternoon to pick up the supplies.   Thanks,   -Mickel Baas

## 2021-05-29 ENCOUNTER — Other Ambulatory Visit: Payer: Self-pay | Admitting: Family Medicine

## 2021-05-29 DIAGNOSIS — F0390 Unspecified dementia without behavioral disturbance: Secondary | ICD-10-CM

## 2021-05-29 NOTE — Telephone Encounter (Signed)
Requested medications are due for refill today.  Unknown  Requested medications are on the active medications list.  no  Last refill. 12/18/2020  Future visit scheduled.   yes  Notes to clinic.  Per rx request medication was d/c'd 01/24/2021.

## 2021-06-03 DIAGNOSIS — H40052 Ocular hypertension, left eye: Secondary | ICD-10-CM | POA: Diagnosis not present

## 2021-06-11 ENCOUNTER — Ambulatory Visit (INDEPENDENT_AMBULATORY_CARE_PROVIDER_SITE_OTHER): Payer: Medicare Other | Admitting: Family Medicine

## 2021-06-11 ENCOUNTER — Other Ambulatory Visit: Payer: Self-pay

## 2021-06-11 ENCOUNTER — Encounter: Payer: Self-pay | Admitting: Family Medicine

## 2021-06-11 VITALS — BP 104/44 | HR 57 | Temp 97.5°F | Resp 16 | Wt 155.8 lb

## 2021-06-11 DIAGNOSIS — I209 Angina pectoris, unspecified: Secondary | ICD-10-CM | POA: Diagnosis not present

## 2021-06-11 DIAGNOSIS — I10 Essential (primary) hypertension: Secondary | ICD-10-CM | POA: Diagnosis not present

## 2021-06-11 DIAGNOSIS — F0390 Unspecified dementia without behavioral disturbance: Secondary | ICD-10-CM

## 2021-06-11 NOTE — Patient Instructions (Signed)
Please review the attached list of medications and notify my office if there are any errors.   Go ahead and stop taking nifedipine since your blood pressure is still low

## 2021-06-11 NOTE — Progress Notes (Signed)
Established patient visit   Patient: Roger Cook   DOB: [birth year]   87 y.o. Male  MRN: [id number] Visit Date: 06/11/2021  Today's healthcare provider: Lelon Huh, MD   Chief Complaint  Patient presents with   Hypertension   Subjective    HPI  Hypertension, follow-up  BP Readings from Last 3 Encounters:  06/11/21 (!) 109/51  04/11/21 (!) 124/58  04/09/21 (!) 115/43   Wt Readings from Last 3 Encounters:  06/11/21 155 lb 12.8 oz (70.7 kg)  04/11/21 150 lb (68 kg)  04/09/21 151 lb (68.5 kg)     He was last seen for hypertension on 04/09/2021.   BP at that visit was 115/43. Management since that visit includes reducing the dose of nifedipine from 60mg  to 30mg    He reports good compliance with treatment. He is not having side effects.  He is following a Regular diet. He is not exercising. He does not smoke.  Use of agents associated with hypertension: NSAIDS and thyroid hormones.   Outside blood pressures are not checked. Symptoms: No chest pain No chest pressure  No palpitations No syncope  No dyspnea No orthopnea  No paroxysmal nocturnal dyspnea No lower extremity edema   Pertinent labs: Lab Results  Component Value Date   CHOL 146 06/09/2019   HDL 42 06/09/2019   LDLCALC 83 06/09/2019   TRIG 116 06/09/2019   CHOLHDL 3.5 06/09/2019   Lab Results  Component Value Date   NA 137 04/08/2021   K 4.4 04/08/2021   CREATININE 1.59 (H) 04/08/2021   GFRNONAA 41 (L) 04/08/2021   GFRAA 51 (L) 06/05/2020   GLUCOSE 98 04/08/2021     The ASCVD Risk score (Arnett DK, et al., 2019) failed to calculate for the following reasons:   The 2019 ASCVD risk score is only valid for ages 88 to 91   ---------------------------------------------------------------------------------------------------     Medications: Outpatient Medications Prior to Visit  Medication Sig   Apoaequorin (PREVAGEN) 10 MG CAPS Take by mouth.   ARTIFICIAL TEAR SOLUTION OP Place 1 drop  into both eyes 2 (two) times daily as needed (dry eyes).   aspirin 81 MG tablet Take 81 mg by mouth daily.    brimonidine (ALPHAGAN) 0.2 % ophthalmic solution Place 1 drop into the left eye 2 (two) times daily.   Calcium Carbonate-Vit D-Min (CALCIUM 1200 PO) Take by mouth.   donepezil (ARICEPT) 10 MG tablet TAKE 1 TABLET BY MOUTH AT BEDTIME   fluticasone (FLONASE) 50 MCG/ACT nasal spray PLACE 1-2 SPRAYS IN EACH NOSTRIL EVERY DAY AS NEEDED (Patient taking differently: Place 2 sprays into both nostrils daily as needed for allergies.)   isosorbide mononitrate (IMDUR) 60 MG 24 hr tablet Take 60 mg by mouth daily.   levothyroxine (SYNTHROID) 88 MCG tablet TAKE 1 TABLET BY MOUTH DAILY (Patient taking differently: Take 88 mcg by mouth daily before breakfast.)   memantine (NAMENDA) 10 MG tablet Take 1 tablet (10 mg total) by mouth 2 (two) times daily.   metoprolol succinate (TOPROL-XL) 25 MG 24 hr tablet Take 12.5 mg by mouth daily.   MULTIPLE VITAMIN PO Take 1 tablet by mouth daily.   NIFEdipine (PROCARDIA-XL/NIFEDICAL-XL) 30 MG 24 hr tablet Take 1 tablet (30 mg total) by mouth daily.   NON FORMULARY CPAP (Free Text) - Historical Medication  As directed  Started 22-Sep-1994 Active   pravastatin (PRAVACHOL) 40 MG tablet TAKE 1 TABLET BY MOUTH DAILY (Patient taking differently: Take 40 mg by mouth  at bedtime.)   PREVIDENT 5000 DRY MOUTH 1.1 % GEL dental gel Place 1 application onto teeth 2 (two) times daily.   No facility-administered medications prior to visit.    Review of Systems  Constitutional:  Negative for appetite change, chills and fever.  Respiratory:  Negative for chest tightness, shortness of breath and wheezing.   Cardiovascular:  Negative for chest pain and palpitations.  Gastrointestinal:  Negative for abdominal pain, nausea and vomiting.      Objective    BP (!) 104/44   Pulse (!) 57   Temp (!) 97.5 F (36.4 C) (Temporal)   Resp 16   Wt 155 lb 12.8 oz (70.7 kg)   BMI 21.73  kg/m  {Show previous vital signs (optional):23777}  Physical Exam  General appearance: Well developed, well nourished male, cooperative and in no acute distress Head: Normocephalic, without obvious abnormality, atraumatic Respiratory: Respirations even and unlabored, normal respiratory rate Extremities: All extremities are intact.  Skin: Skin color, texture, turgor normal. No rashes seen  Psych: Appropriate mood and affect. Neurologic: Mental status: Alert, oriented to person, place, and time, thought content appropriate.   No results found for any visits on 06/11/21.  Assessment & Plan     1. Essential hypertension Remains hypotension since reducing nifedipine to 30mg . Continue metoprolol and Isosorbide. Stop nifedipine. Follow up for BP check in about 6 weeks.   2. Dementia without behavioral disturbance, unspecified dementia type ([place]) Doing well with Namenda and Aricept. He doesn't think he has any memory problems lately and today is oriented to time whereas he was not prior to starting medications.       The entirety of the information documented in the History of Present Illness, Review of Systems and Physical Exam were personally obtained by me. Portions of this information were initially documented by the CMA and reviewed by me for thoroughness and accuracy.     Lelon Huh, MD  [facility] [phone] (phone) [phone] (fax)  [place]

## 2021-06-20 ENCOUNTER — Other Ambulatory Visit: Payer: Self-pay | Admitting: Family Medicine

## 2021-06-20 DIAGNOSIS — R413 Other amnesia: Secondary | ICD-10-CM

## 2021-06-25 DIAGNOSIS — X32XXXA Exposure to sunlight, initial encounter: Secondary | ICD-10-CM | POA: Diagnosis not present

## 2021-06-25 DIAGNOSIS — D2261 Melanocytic nevi of right upper limb, including shoulder: Secondary | ICD-10-CM | POA: Diagnosis not present

## 2021-06-25 DIAGNOSIS — D2262 Melanocytic nevi of left upper limb, including shoulder: Secondary | ICD-10-CM | POA: Diagnosis not present

## 2021-06-25 DIAGNOSIS — L570 Actinic keratosis: Secondary | ICD-10-CM | POA: Diagnosis not present

## 2021-06-25 DIAGNOSIS — D225 Melanocytic nevi of trunk: Secondary | ICD-10-CM | POA: Diagnosis not present

## 2021-06-25 DIAGNOSIS — D2271 Melanocytic nevi of right lower limb, including hip: Secondary | ICD-10-CM | POA: Diagnosis not present

## 2021-06-25 DIAGNOSIS — Z85828 Personal history of other malignant neoplasm of skin: Secondary | ICD-10-CM | POA: Diagnosis not present

## 2021-06-28 DIAGNOSIS — Z23 Encounter for immunization: Secondary | ICD-10-CM | POA: Diagnosis not present

## 2021-07-01 ENCOUNTER — Other Ambulatory Visit: Payer: Self-pay | Admitting: Family Medicine

## 2021-07-01 DIAGNOSIS — I209 Angina pectoris, unspecified: Secondary | ICD-10-CM

## 2021-07-01 NOTE — Telephone Encounter (Signed)
Requested medications are due for refill today yes  Requested medications are on the active medication list yes  Last refill 03/26/21  Last visit 06/11/21  Future visit scheduled 07/23/21  Notes to clinic Historical Provider, please assess.

## 2021-07-11 ENCOUNTER — Other Ambulatory Visit: Payer: Self-pay | Admitting: Family Medicine

## 2021-07-11 DIAGNOSIS — I10 Essential (primary) hypertension: Secondary | ICD-10-CM

## 2021-07-11 NOTE — Telephone Encounter (Signed)
Requested medications are due for refill today NO  Requested medications are on the active medication list NO  Last refill 04/09/21  Last visit 06/11/21  Future visit scheduled 07/23/21  Notes to clinic Not on current med list, please assess.  Requested Prescriptions  Pending Prescriptions Disp Refills   NIFEdipine (PROCARDIA-XL/NIFEDICAL-XL) 30 MG 24 hr tablet [Pharmacy Med Name: NIFEDIPINE ER OSMOTIC RELEASE 30 MG] 90 tablet 1    Sig: TAKE 1 TABLET BY MOUTH DAILY.     Cardiovascular:  Calcium Channel Blockers Failed - 07/11/2021 10:26 AM      Failed - Last BP in normal range    BP Readings from Last 1 Encounters:  06/11/21 (!) 104/44          Passed - Valid encounter within last 6 months    Recent Outpatient Visits           1 month ago Essential hypertension   [facility] [name], MD   3 months ago Essential hypertension   [facility] [name], MD   6 months ago Dementia without behavioral disturbance, unspecified dementia type [facility])   [facility] [name], MD   9 months ago Angina pectoris [facility])   [facility] [name], MD   10 months ago Angina pectoris [facility])   [facility] [name], MD       Future Appointments             In 1 week Fisher, Kirstie Peri, MD [facility], [place]   In 9 months Stoioff, Ronda Fairly, [place]

## 2021-07-23 ENCOUNTER — Encounter: Payer: Self-pay | Admitting: Family Medicine

## 2021-07-23 ENCOUNTER — Ambulatory Visit (INDEPENDENT_AMBULATORY_CARE_PROVIDER_SITE_OTHER): Payer: Medicare Other | Admitting: Family Medicine

## 2021-07-23 ENCOUNTER — Other Ambulatory Visit: Payer: Self-pay

## 2021-07-23 VITALS — BP 107/43 | HR 63 | Temp 98.1°F | Resp 18 | Wt 157.0 lb

## 2021-07-23 DIAGNOSIS — I209 Angina pectoris, unspecified: Secondary | ICD-10-CM

## 2021-07-23 DIAGNOSIS — E039 Hypothyroidism, unspecified: Secondary | ICD-10-CM | POA: Diagnosis not present

## 2021-07-23 DIAGNOSIS — I10 Essential (primary) hypertension: Secondary | ICD-10-CM

## 2021-07-23 DIAGNOSIS — N183 Chronic kidney disease, stage 3 unspecified: Secondary | ICD-10-CM

## 2021-07-23 NOTE — Progress Notes (Signed)
Established patient visit   Patient: Roger Cook   DOB: 1927-01-15   87 y.o. Male  MRN: [id number] Visit Date: 07/23/2021  Today's healthcare provider: Lelon Huh, MD   Chief Complaint  Patient presents with   Hypertension   Subjective    HPI  Hypertension, follow-up  BP Readings from Last 3 Encounters:  07/23/21 (!) 107/43  06/11/21 (!) 104/44  04/11/21 (!) 124/58   Wt Readings from Last 3 Encounters:  07/23/21 157 lb (71.2 kg)  06/11/21 155 lb 12.8 oz (70.7 kg)  04/11/21 150 lb (68 kg)     He was last seen for hypertension on 06/11/2021.   BP at that visit was 104/44. Management since that visit includes advising patient to continue metoprolol and Isosorbide. Stop nifedipine. Follow up for BP check in about 6 weeks.  He reports fair compliance with treatment (patient is unsure if he made the recommended changes after last office visit.). He is not having side effects.  He is following a Regular diet. He is not exercising. He does not smoke.  Use of agents associated with hypertension: NSAIDS and thyroid hormones.   Outside blood pressures are not checked. Symptoms: No chest pain No chest pressure  No palpitations No syncope  No dyspnea No orthopnea  No paroxysmal nocturnal dyspnea No lower extremity edema   Pertinent labs: Lab Results  Component Value Date   CHOL 146 06/09/2019   HDL 42 06/09/2019   LDLCALC 83 06/09/2019   TRIG 116 06/09/2019   CHOLHDL 3.5 06/09/2019   Lab Results  Component Value Date   NA 137 04/08/2021   K 4.4 04/08/2021   CREATININE 1.59 (H) 04/08/2021   GFRNONAA 41 (L) 04/08/2021   GLUCOSE 98 04/08/2021   TSH 2.690 06/05/2020     The ASCVD Risk score (Arnett DK, et al., 2019) failed to calculate for the following reasons:   The 2019 ASCVD risk score is only valid for ages 28 to 61   ---------------------------------------------------------------------------------------------------     Medications: Outpatient  Medications Prior to Visit  Medication Sig   Apoaequorin (PREVAGEN) 10 MG CAPS Take by mouth.   ARTIFICIAL TEAR SOLUTION OP Place 1 drop into both eyes 2 (two) times daily as needed (dry eyes).   aspirin 81 MG tablet Take 81 mg by mouth daily.    brimonidine (ALPHAGAN) 0.2 % ophthalmic solution Place 1 drop into the left eye 2 (two) times daily.   Calcium Carbonate-Vit D-Min (CALCIUM 1200 PO) Take by mouth.   donepezil (ARICEPT) 10 MG tablet TAKE 1 TABLET BY MOUTH AT BEDTIME   fluticasone (FLONASE) 50 MCG/ACT nasal spray PLACE 1-2 SPRAYS IN EACH NOSTRIL EVERY DAY AS NEEDED (Patient taking differently: Place 2 sprays into both nostrils daily as needed for allergies.)   isosorbide mononitrate (IMDUR) 60 MG 24 hr tablet Take 1 tablet (60 mg total) by mouth daily.   levothyroxine (SYNTHROID) 88 MCG tablet TAKE 1 TABLET BY MOUTH DAILY (Patient taking differently: Take 88 mcg by mouth daily before breakfast.)   memantine (NAMENDA) 10 MG tablet Take 1 tablet (10 mg total) by mouth 2 (two) times daily.   metoprolol succinate (TOPROL-XL) 25 MG 24 hr tablet Take 12.5 mg by mouth daily.   MULTIPLE VITAMIN PO Take 1 tablet by mouth daily.   NON FORMULARY CPAP (Free Text) - Historical Medication  As directed  Started 22-Sep-1994 Active   pravastatin (PRAVACHOL) 40 MG tablet TAKE 1 TABLET BY MOUTH DAILY (Patient taking differently:  Take 40 mg by mouth at bedtime.)   PREVIDENT 5000 DRY MOUTH 1.1 % GEL dental gel Place 1 application onto teeth 2 (two) times daily.   No facility-administered medications prior to visit.    Review of Systems  Constitutional:  Negative for appetite change, chills and fever.  Respiratory:  Negative for chest tightness, shortness of breath and wheezing.   Cardiovascular:  Negative for chest pain and palpitations.  Gastrointestinal:  Negative for abdominal pain, nausea and vomiting.       Objective    BP (!) 107/43 (BP Location: Right Arm, Patient Position: Sitting, Cuff Size:  Normal)   Pulse 63   Temp 98.1 F (36.7 C) (Oral)   Resp 18   Wt 157 lb (71.2 kg)   SpO2 100% Comment: room air  BMI 21.90 kg/m  {Show previous vital signs (optional):23777}  Physical Exam   General: Appearance:    Well developed, well nourished male in no acute distress  Eyes:    PERRL, conjunctiva/corneas clear, EOM's intact       Lungs:     Clear to auscultation bilaterally, respirations unlabored  Heart:    Normal heart rate. Normal rhythm. No murmurs, rubs, or gallops.    MS:   All extremities are intact.    Neurologic:   Awake, alert, oriented x 3. No apparent focal neurological defect.          Assessment & Plan     1. Essential hypertension BP remains low, dispense history shows that nifedipine has been dispensed once since his last visit when it was supposed to have been discontinued. Reviewed his medication list in detail today and clearly instructed him not to take nifedipine.   2. Hypothyroidism, unspecified type  - TSH - T4, free  3. Stage 3 chronic kidney disease, unspecified whether stage 3a or 3b CKD (HCC)  - Comprehensive metabolic panel - CBC - VITAMIN D 25 Hydroxy (Vit-D Deficiency, Fractures)        The entirety of the information documented in the History of Present Illness, Review of Systems and Physical Exam were personally obtained by me. Portions of this information were initially documented by the CMA and reviewed by me for thoroughness and accuracy.     Lelon Huh, MD  [facility] [phone] (phone) [phone] (fax)  [place]

## 2021-07-23 NOTE — Patient Instructions (Addendum)
Please review the attached list of medications and notify my office if there are any errors.   Please go to the lab draw station in [address] on the second floor of [facility] . Normal hours are 8:00am to 11:30am and 1:00pm to 4:00pm Monday through Friday   Make sure that you are NOT taking Nifedipine. Nifedipine is a blood pressure medication that you do NOT need to take anymore

## 2021-07-24 ENCOUNTER — Telehealth: Payer: Self-pay

## 2021-07-24 LAB — COMPREHENSIVE METABOLIC PANEL WITH GFR
ALT: 13 IU/L (ref 0–44)
AST: 20 IU/L (ref 0–40)
Albumin/Globulin Ratio: 1.7 (ref 1.2–2.2)
Albumin: 4.1 g/dL (ref 3.5–4.6)
Alkaline Phosphatase: 62 IU/L (ref 44–121)
BUN/Creatinine Ratio: 17 (ref 10–24)
BUN: 26 mg/dL (ref 10–36)
Bilirubin Total: <0.2 mg/dL (ref 0.0–1.2)
CO2: 22 mmol/L (ref 20–29)
Calcium: 8.7 mg/dL (ref 8.6–10.2)
Chloride: 102 mmol/L (ref 96–106)
Creatinine, Ser: 1.56 mg/dL — ABNORMAL HIGH (ref 0.76–1.27)
Globulin, Total: 2.4 g/dL (ref 1.5–4.5)
Glucose: 122 mg/dL — ABNORMAL HIGH (ref 70–99)
Potassium: 4.7 mmol/L (ref 3.5–5.2)
Sodium: 137 mmol/L (ref 134–144)
Total Protein: 6.5 g/dL (ref 6.0–8.5)
eGFR: 41 mL/min/1.73 — ABNORMAL LOW (ref >59)

## 2021-07-24 LAB — CBC
Hematocrit: 20.5 % — ABNORMAL LOW (ref 37.5–51.0)
Hemoglobin: 6.1 g/dL — CL (ref 13.0–17.7)
MCH: 23.3 pg — ABNORMAL LOW (ref 26.6–33.0)
MCHC: 29.8 g/dL — ABNORMAL LOW (ref 31.5–35.7)
MCV: 78 fL — ABNORMAL LOW (ref 79–97)
Platelets: 276 10*3/uL (ref 150–450)
RBC: 2.62 x10E6/uL — CL (ref 4.14–5.80)
RDW: 14.5 % (ref 11.6–15.4)
WBC: 7.1 10*3/uL (ref 3.4–10.8)

## 2021-07-24 LAB — T4, FREE: Free T4: 1.21 ng/dL (ref 0.82–1.77)

## 2021-07-24 LAB — TSH: TSH: 2.61 u[IU]/mL (ref 0.450–4.500)

## 2021-07-24 LAB — VITAMIN D 25 HYDROXY (VIT D DEFICIENCY, FRACTURES): Vit D, 25-Hydroxy: 42.8 ng/mL (ref 30.0–100.0)

## 2021-07-24 NOTE — Telephone Encounter (Signed)
Pt advised.  He states he is not taking iron but will start.  He is also going to come by and pick up an OC Light.   Thanks,   -Roger Cook

## 2021-07-24 NOTE — Telephone Encounter (Signed)
-----   Message from [name], MD sent at 07/24/2021  8:22 AM EDT ----- Patient has very very low blood cell count. This is probably due to blood in urine.  Is he taking an iron supplement? If not then he needs to start taking OTC iron sulfate 325mg  once every day.  Also needs to do OC-lyte to make sure he is not losing blood from stool.

## 2021-07-26 ENCOUNTER — Other Ambulatory Visit: Payer: Self-pay | Admitting: Family Medicine

## 2021-08-02 DIAGNOSIS — Z23 Encounter for immunization: Secondary | ICD-10-CM | POA: Diagnosis not present

## 2021-08-27 ENCOUNTER — Other Ambulatory Visit: Payer: Self-pay

## 2021-08-27 ENCOUNTER — Other Ambulatory Visit: Payer: Self-pay | Admitting: Family Medicine

## 2021-08-27 NOTE — Telephone Encounter (Signed)
Requested medication (s) are due for refill today: yes  Requested medication (s) are on the active medication list yes   Last refill: 11/19/20  #90  1 refill  Future visit scheduled yes 11/26/21  Notes to clinic:  failed due to labs. Please review  Requested Prescriptions  Pending Prescriptions Disp Refills   pravastatin (PRAVACHOL) 40 MG tablet [Pharmacy Med Name: PRAVASTATIN SODIUM 40 MG TAB] 90 tablet 1    Sig: TAKE 1 TABLET BY MOUTH DAILY     Cardiovascular:  Antilipid - Statins Failed - 08/27/2021 12:30 PM      Failed - Total Cholesterol in normal range and within 360 days    Cholesterol, Total  Date Value Ref Range Status  06/09/2019 146 100 - 199 mg/dL Final          Failed - LDL in normal range and within 360 days    LDL Chol Calc (NIH)  Date Value Ref Range Status  06/09/2019 83 0 - 99 mg/dL Final          Failed - HDL in normal range and within 360 days    HDL  Date Value Ref Range Status  06/09/2019 42 >39 mg/dL Final          Failed - Triglycerides in normal range and within 360 days    Triglycerides  Date Value Ref Range Status  06/09/2019 116 0 - 149 mg/dL Final          Passed - Patient is not pregnant      Passed - Valid encounter within last 12 months    Recent Outpatient Visits           1 month ago Essential hypertension   [place], Donald E, MD   2 months ago Essential hypertension   [facility] [name], MD   4 months ago Essential hypertension   [facility] [name], MD   8 months ago Dementia without behavioral disturbance, unspecified dementia type [facility])   [facility] [name], MD   11 months ago Angina pectoris [facility])   [place], Kirstie Peri, MD       Future Appointments             In 7 months Cerulean, Ronda Fairly, MD [place]

## 2021-09-24 ENCOUNTER — Other Ambulatory Visit: Payer: Self-pay | Admitting: Family Medicine

## 2021-09-26 ENCOUNTER — Telehealth: Payer: Self-pay

## 2021-09-26 NOTE — Telephone Encounter (Signed)
Copied from [place] [id number]. Topic: General - Other >> Sep 26, 2021  2:34 PM Pawlus, Brayton Layman A wrote: Reason for CRM: Pts daughter, Earney Hamburg, requested a call back to go over some information regarding the pt, did not give specific details.

## 2021-09-27 NOTE — Telephone Encounter (Signed)
FYI...   I spoke with Roger Cook she is concern her dad's memory is getting worse.  However he is still able to drive some (and does not get lost).  She states her step mom does not know he has dementia yet.  Roger Cook was waiting on Rondy to tell her.)  I advised her it would be best if she Roger Cook) tells her step mom about his dementia diagnosis so they can work out a plan of care for the future.  She agreed and will discuss this with her step mom.  She is also going to call back and schedule an appointment so both her and her step mom can be with him at the next appointment.     Thanks,   -Mickel Baas

## 2021-09-27 NOTE — Telephone Encounter (Signed)
Daughter Mardene Celeste calling back again to speak w/ Dr Maralyn Sago nurse.  She said there are some things she would like to go over. Today after 3 pm, or anytime Monday. [phone]

## 2021-09-30 ENCOUNTER — Telehealth: Payer: Self-pay

## 2021-09-30 NOTE — Telephone Encounter (Signed)
Copied from [place] [id number]. Topic: Appointment Scheduling - Scheduling Inquiry for Clinic >> Sep 30, 2021 11:39 AM Alanda Slim E wrote: Reason for CRM: Pts wife called and wants to come in office and see Dr. Caryn Section to have "a discussion" / pts wife asked if she can bring in the pt tomorrow or Thursday to see Dr. Caryn Section and if he can fit him in or change a virtual slot / pts wife has another appt on Wednesday and they will be out of town on Friday /please advise

## 2021-10-02 NOTE — Telephone Encounter (Signed)
He can have one of the acute visit slots Friday.

## 2021-10-02 NOTE — Telephone Encounter (Addendum)
Tried returning patient's wife Pamala Hurry) call. Left message to call back. OK for [facility] triage to advise and offer appointment. [facility] may need to reach out to office about appointment.

## 2021-10-03 NOTE — Telephone Encounter (Signed)
I returned Roger Cook's call and offered an appointment for Friday. Roger Cook states that they are not able to come in on Fridays. Roger Cook is requesting an appointment in the afternoon, Monday-Thursday. There is a 1:20pm same day slot on 10/08/2021 on hold. Can I offer that appointment time to them? Roger Cook wants to bring Roger Cook in to discuss his Dementia diagnosis and safety. Roger Cook doesn't believe anything is wrong and Roger Cook would like Dr. Caryn Section to talk to Roger Cook in person. Please advise.

## 2021-10-03 NOTE — Telephone Encounter (Signed)
That's fine, he can have the blocked same day slot.

## 2021-10-03 NOTE — Telephone Encounter (Signed)
Patients daughter Erlene Quan called in needing Dr Sabino Snipes nurse to calle her in order to get patient scheduled for an office visit sometimes soon because waiting till March 29 would be too late and due to patients Dx of dementia he is needing to be seen. Please call [name] ay Ph# [phone]

## 2021-10-03 NOTE — Telephone Encounter (Signed)
Patient daughter Chong Sicilian advised. Appointment scheduled.

## 2021-10-08 ENCOUNTER — Ambulatory Visit (INDEPENDENT_AMBULATORY_CARE_PROVIDER_SITE_OTHER): Payer: Medicare Other | Admitting: Family Medicine

## 2021-10-08 ENCOUNTER — Encounter: Payer: Self-pay | Admitting: Family Medicine

## 2021-10-08 ENCOUNTER — Other Ambulatory Visit: Payer: Self-pay

## 2021-10-08 VITALS — BP 128/42 | HR 63 | Temp 97.8°F | Resp 16 | Wt 158.0 lb

## 2021-10-08 DIAGNOSIS — F03B0 Unspecified dementia, moderate, without behavioral disturbance, psychotic disturbance, mood disturbance, and anxiety: Secondary | ICD-10-CM

## 2021-10-08 NOTE — Patient Instructions (Signed)
.   Please review the attached list of medications and notify my office if there are any errors.   . Please bring all of your medications to every appointment so we can make sure that our medication list is the same as yours.   

## 2021-10-08 NOTE — Progress Notes (Signed)
Established patient visit   Patient: Roger Cook   DOB: [birth year]   88 y.o. Male  MRN: [id number] Visit Date: 10/08/2021  Today's healthcare provider: Lelon Huh, MD   Chief Complaint  Patient presents with   Dementia   Subjective    HPI  Follow up for dementia:  The patient was last seen for this 4 months ago. Changes made at last visit include continuing Namenda and Aricept. He has been taking Aricept since January 2021 and Namenda since March of 2022.   He reports good compliance with treatment. He feels that condition is Unchanged. He is not having side effects.  Patient is here with his wife to discuss the diagnosis of Dementia. Patients son called the office requesting this appointment. His wife is concerned that he often doesn't remember things 5 minutes later. She states that the patient doesn't think he has dementia.  -----------------------------------------------------------------------------------------   Medications: Outpatient Medications Prior to Visit  Medication Sig   Apoaequorin (PREVAGEN) 10 MG CAPS Take by mouth.   ARTIFICIAL TEAR SOLUTION OP Place 1 drop into both eyes 2 (two) times daily as needed (dry eyes).   aspirin 81 MG tablet Take 81 mg by mouth daily.    brimonidine (ALPHAGAN) 0.2 % ophthalmic solution Place 1 drop into the left eye 2 (two) times daily.   Calcium Carbonate-Vit D-Min (CALCIUM 1200 PO) Take by mouth.   donepezil (ARICEPT) 10 MG tablet TAKE 1 TABLET BY MOUTH AT BEDTIME   fluticasone (FLONASE) 50 MCG/ACT nasal spray Place 2 sprays into both nostrils daily as needed for allergies.   isosorbide mononitrate (IMDUR) 60 MG 24 hr tablet Take 1 tablet (60 mg total) by mouth daily.   levothyroxine (SYNTHROID) 88 MCG tablet Take 1 tablet (88 mcg total) by mouth daily before breakfast.   memantine (NAMENDA) 10 MG tablet Take 1 tablet (10 mg total) by mouth 2 (two) times daily.   metoprolol succinate (TOPROL-XL) 25 MG 24 hr tablet  Take 12.5 mg by mouth daily.   MULTIPLE VITAMIN PO Take 1 tablet by mouth daily.   NON FORMULARY CPAP (Free Text) - Historical Medication  As directed  Started 22-Sep-1994 Active   pravastatin (PRAVACHOL) 40 MG tablet TAKE 1 TABLET BY MOUTH DAILY   PREVIDENT 5000 DRY MOUTH 1.1 % GEL dental gel Place 1 application onto teeth 2 (two) times daily.   No facility-administered medications prior to visit.    Review of Systems  Constitutional:  Negative for appetite change, chills and fever.  Respiratory:  Negative for chest tightness, shortness of breath and wheezing.   Cardiovascular:  Negative for chest pain and palpitations.  Gastrointestinal:  Negative for abdominal pain, nausea and vomiting.      Objective    BP (!) 136/42 (BP Location: Left Arm, Patient Position: Sitting, Cuff Size: Normal)    Pulse 63    Temp 97.8 F (36.6 C) (Oral)    Resp 16    Wt 158 lb (71.7 kg)    SpO2 100% Comment: room air   BMI 22.04 kg/m  {Show previous vital signs (optional):23777} Today's Vitals   10/08/21 1307 10/08/21 1313  BP: (!) 132/38 (!) 136/42  Pulse: 63   Resp: 16   Temp: 97.8 F (36.6 C)   TempSrc: Oral   SpO2: 100%   Weight: 158 lb (71.7 kg)    Body mass index is 22.04 kg/m.    Physical Exam   General: Appearance:    Well developed,  well nourished male in no acute distress  Eyes:    PERRL, conjunctiva/corneas clear, EOM's intact       Lungs:     Clear to auscultation bilaterally, respirations unlabored  Heart:    Normal heart rate. Normal rhythm. No murmurs, rubs, or gallops.    MS:   All extremities are intact.    Neurologic:   Awake, alert, oriented x 2. Knows month and day of the week, but not date or year.         Assessment & Plan     1. Moderate dementia without behavioral disturbance, psychotic disturbance, mood disturbance, or anxiety, unspecified dementia type  Family is concerned that patient doesn't think he has dementia. He is aware that he is being prescribed  Namenda and Aricept to help with his memory and explained to him that his poor memory is dementia which he accepts. He initially seem to level off with addition of Namenda, but wife feels he is progressing again. Continue current medications for now.  - Ambulatory referral to Neurology   Future Appointments  Date Time Provider [place]  11/26/2021  2:00 PM [name], MD [facility]  04/14/2022  1:00 PM Stoioff, Ronda Fairly, MD [facility] None        The entirety of the information documented in the History of Present Illness, Review of Systems and Physical Exam were personally obtained by me. Portions of this information were initially documented by the CMA and reviewed by me for thoroughness and accuracy.     Lelon Huh, MD  [facility] [phone] (phone) [phone] (fax)  [place]

## 2021-11-18 DIAGNOSIS — Z7689 Persons encountering health services in other specified circumstances: Secondary | ICD-10-CM | POA: Diagnosis not present

## 2021-11-18 DIAGNOSIS — R413 Other amnesia: Secondary | ICD-10-CM | POA: Diagnosis not present

## 2021-11-18 DIAGNOSIS — F39 Unspecified mood [affective] disorder: Secondary | ICD-10-CM | POA: Diagnosis not present

## 2021-11-19 ENCOUNTER — Other Ambulatory Visit: Payer: Self-pay | Admitting: Family Medicine

## 2021-11-19 DIAGNOSIS — F0390 Unspecified dementia without behavioral disturbance: Secondary | ICD-10-CM

## 2021-11-23 ENCOUNTER — Other Ambulatory Visit: Payer: Self-pay | Admitting: Family Medicine

## 2021-11-26 ENCOUNTER — Encounter: Payer: Medicare Other | Admitting: Family Medicine

## 2021-12-23 ENCOUNTER — Ambulatory Visit (INDEPENDENT_AMBULATORY_CARE_PROVIDER_SITE_OTHER): Payer: Medicare Other | Admitting: Family Medicine

## 2021-12-23 ENCOUNTER — Encounter: Payer: Self-pay | Admitting: Family Medicine

## 2021-12-23 VITALS — BP 118/40 | HR 82 | Temp 97.6°F | Resp 12 | Ht 66.0 in | Wt 156.0 lb

## 2021-12-23 DIAGNOSIS — R152 Fecal urgency: Secondary | ICD-10-CM

## 2021-12-23 DIAGNOSIS — I10 Essential (primary) hypertension: Secondary | ICD-10-CM

## 2021-12-23 DIAGNOSIS — M79605 Pain in left leg: Secondary | ICD-10-CM | POA: Diagnosis not present

## 2021-12-23 DIAGNOSIS — F0390 Unspecified dementia without behavioral disturbance: Secondary | ICD-10-CM

## 2021-12-23 DIAGNOSIS — I25119 Atherosclerotic heart disease of native coronary artery with unspecified angina pectoris: Secondary | ICD-10-CM

## 2021-12-23 DIAGNOSIS — Z0000 Encounter for general adult medical examination without abnormal findings: Secondary | ICD-10-CM

## 2021-12-23 DIAGNOSIS — E039 Hypothyroidism, unspecified: Secondary | ICD-10-CM

## 2021-12-23 DIAGNOSIS — R159 Full incontinence of feces: Secondary | ICD-10-CM | POA: Diagnosis not present

## 2021-12-23 DIAGNOSIS — I2510 Atherosclerotic heart disease of native coronary artery without angina pectoris: Secondary | ICD-10-CM | POA: Diagnosis not present

## 2021-12-23 NOTE — Patient Instructions (Signed)
Please review the attached list of medications and notify my office if there are any errors.  ? ?Please bring all of your medications to every appointment so we can make sure that our medication list is the same as yours.  ? ?You can apply a heat pad to your left thigh occasional to help heal up.  ?

## 2021-12-23 NOTE — Progress Notes (Signed)
? ? ?I,Roger Cook,acting as a scribe for Roger Huh, MD.,have documented all relevant documentation on the behalf of Roger Huh, MD,as directed by  Roger Huh, MD while in the presence of Roger Huh, MD.  ? ? ?Annual Wellness Visit ? ?  ? ?Patient: Roger Cook, Male    DOB: September 04, 1927, 88 y.o.   MRN: [id number] ?Visit Date: 12/23/2021 ? ?Today's Provider: Lelon Huh, MD  ? ?Chief Complaint  ?Patient presents with  ? Medicare Wellness  ? Hypertension  ? Hypothyroidism  ? ?Subjective  ?  ?Roger Cook is a 88 y.o. male who presents today for his Annual Wellness Visit. ?He reports consuming a general diet. The patient does not participate in regular exercise at present. He generally feels fairly well. He reports sleeping fairly well. He does have additional problems to discuss today (pain in left anterior leg for the last two weeks and occasional chest pain). He is followed by Dr. Clayborn Bigness for CAD with stable angina on Imdur.  ? ?He also reports occasional trouble controlling bowels. Will have sudden urge to have a bowel movement and is unable to make it to to bathroom in time.  ? ?HPI ?Hypertension, follow-up ? ?BP Readings from Last 3 Encounters:  ?12/23/21 (!) 118/40  ?10/08/21 (!) 128/42  ?07/23/21 (!) 107/43  ? Wt Readings from Last 3 Encounters:  ?12/23/21 156 lb (70.8 kg)  ?10/08/21 158 lb (71.7 kg)  ?07/23/21 157 lb (71.2 kg)  ?  ? ?He was last seen for hypertension 5 months ago.  ?BP at that visit was 107/43. Management since that visit includes instructing him not to take nifedipine.  ? ?He reports good compliance with treatment. ?He is not having side effects.  ?He is following a Regular diet. ?He is not exercising. ?He does not smoke. ? ?Use of agents associated with hypertension: none.  ? ?Outside blood pressures are not checked. ?Symptoms: ?Yes chest pain (occurs at least once a week) No chest pressure  ?No palpitations No syncope  ?No dyspnea No orthopnea  ?No paroxysmal  nocturnal dyspnea No lower extremity edema  ? ?Pertinent labs ?Lab Results  ?Component Value Date  ? CHOL 146 06/09/2019  ? HDL 42 06/09/2019  ? [place] 83 06/09/2019  ? TRIG 116 06/09/2019  ? CHOLHDL 3.5 06/09/2019  ? Lab Results  ?Component Value Date  ? NA 137 07/23/2021  ? K 4.7 07/23/2021  ? CREATININE 1.56 (H) 07/23/2021  ? EGFR 41 (L) 07/23/2021  ? GLUCOSE 122 (H) 07/23/2021  ? TSH 2.610 07/23/2021  ?  ? ?The ASCVD Risk score (Arnett DK, et al., 2019) failed to calculate for the following reasons: ?  The 2019 ASCVD risk score is only valid for ages 46 to 70 ? ?---------------------------------------------------------------------------------------------------  ? ?Hypothyroid, follow-up ? ?Lab Results  ?Component Value Date  ? TSH 2.610 07/23/2021  ? TSH 2.690 06/05/2020  ? TSH 2.920 06/09/2019  ? FREET4 1.21 07/23/2021  ? T4TOTAL 6.4 02/05/2017  ? T4TOTAL 6.2 11/07/2016  ? ? ?Wt Readings from Last 3 Encounters:  ?12/23/21 156 lb (70.8 kg)  ?10/08/21 158 lb (71.7 kg)  ?07/23/21 157 lb (71.2 kg)  ? ? ?He was last seen for hypothyroid 5 months ago.  ?Management since that visit includes continue same dose of medication. ?He reports good compliance with treatment. ?He is not having side effects.  ? ?Symptoms: ?No change in energy level No constipation  ?No diarrhea No heat / cold intolerance  ?No nervousness No palpitations  ?  No weight changes   ? ?-----------------------------------------------------------------------------------------  ? ? ?Medications: ?Outpatient Medications Prior to Visit  ?Medication Sig  ? Apoaequorin (PREVAGEN) 10 MG CAPS Take by mouth.  ? ARTIFICIAL TEAR SOLUTION OP Place 1 drop into both eyes 2 (two) times daily as needed (dry eyes).  ? aspirin 81 MG tablet Take 81 mg by mouth daily.   ? brimonidine (ALPHAGAN) 0.2 % ophthalmic solution Place 1 drop into the left eye 2 (two) times daily.  ? Calcium Carbonate-Vit D-Min (CALCIUM 1200 PO) Take by mouth.  ? donepezil (ARICEPT) 10 MG tablet  TAKE 1 TABLET BY MOUTH AT BEDTIME  ? fluticasone (FLONASE) 50 MCG/ACT nasal spray Place 2 sprays into both nostrils daily as needed for allergies.  ? isosorbide mononitrate (IMDUR) 60 MG 24 hr tablet Take 1 tablet (60 mg total) by mouth daily.  ? levothyroxine (SYNTHROID) 88 MCG tablet Take 1 tablet (88 mcg total) by mouth daily before breakfast.  ? memantine (NAMENDA) 10 MG tablet TAKE 1 TABLET BY MOUTH TWO TIMES DAILY  ? metoprolol succinate (TOPROL-XL) 25 MG 24 hr tablet Take 12.5 mg by mouth daily.  ? MULTIPLE VITAMIN PO Take 1 tablet by mouth daily.  ? NON FORMULARY CPAP (Free Text) - Historical Medication  As directed  Started 22-Sep-1994 Active  ? pravastatin (PRAVACHOL) 40 MG tablet TAKE 1 TABLET BY MOUTH DAILY  ? PREVIDENT 5000 DRY MOUTH 1.1 % GEL dental gel Place 1 application onto teeth 2 (two) times daily.  ? ?No facility-administered medications prior to visit.  ?  ?Allergies  ?Allergen Reactions  ? Levofloxacin Swelling  ?  lips swelling  ? Povidone Iodine   ?  Unknown, can not remember  ? Sulfa Antibiotics   ?  Unknown, can not remember  ? Benadryl [Diphenhydramine] Rash  ? ? ?Patient Care Team: ?[name], MD as PCP - General (Family Medicine) ?Birder Robson, MD as Referring Physician (Ophthalmology) ?Stoioff, Ronda Fairly, MD (Urology) ?Dasher, Rayvon Char, MD (Dermatology) ? ?Review of Systems  ?Constitutional:  Negative for appetite change, chills, fatigue and fever.  ?HENT:  Negative for congestion, ear pain, hearing loss, nosebleeds and trouble swallowing.   ?Eyes:  Negative for pain and visual disturbance.  ?Respiratory:  Negative for cough, chest tightness, shortness of breath and wheezing.   ?Cardiovascular:  Positive for chest pain. Negative for palpitations and leg swelling.  ?Gastrointestinal:  Negative for abdominal pain, blood in stool, constipation, diarrhea, nausea and vomiting.  ?Endocrine: Negative for polydipsia, polyphagia and polyuria.  ?Genitourinary:  Negative for dysuria and  flank pain.  ?Musculoskeletal:  Negative for arthralgias, back pain, joint swelling, myalgias and neck stiffness.  ?     Left leg pain  ?Skin:  Negative for color change, rash and wound.  ?Neurological:  Negative for dizziness, tremors, seizures, speech difficulty, weakness, light-headedness and headaches.  ?Psychiatric/Behavioral:  Negative for behavioral problems, confusion, decreased concentration, dysphoric mood and sleep disturbance. The patient is not nervous/anxious.   ?All other systems reviewed and are negative. ? ?  ?  ? Objective  ?  ?Vitals: BP (!) 118/40 (BP Location: Right Arm, Patient Position: Sitting, Cuff Size: Normal)   Pulse 82   Temp 97.6 ?F (36.4 ?C) (Oral)   Resp 12   Ht $R'5\' 6"'md$  (1.676 m)   Wt 156 lb (70.8 kg)   SpO2 100% Comment: room air  BMI 25.18 kg/m?  ?  ?Today's Vitals  ? 12/23/21 1438 12/23/21 1444  ?BP: (!) 132/38 (!) 118/40  ?Pulse:  82   ?Resp: 12   ?Temp: 97.6 ?F (36.4 ?C)   ?TempSrc: Oral   ?SpO2: 100%   ?Weight: 156 lb (70.8 kg)   ?Height: _0  (1.676 m)   ? ?Body mass index is 25.18 kg/m?.  ? ? ?Physical Exam ? ? ?General: Appearance:     ?Well developed, well nourished male in no acute distress  ?Eyes:    PERRL, conjunctiva/corneas clear, EOM's intact       ?Lungs:     Clear to auscultation bilaterally, respirations unlabored  ?Heart:    Normal heart rate. Normal rhythm. No murmurs, rubs, or gallops.    ?MS:   All extremities are intact.  Minimal tenderness left anterior thigh. No gross deformities.   ?Neurologic:   Awake, alert, oriented x 2. No apparent focal neurological defect.   ?   ?  ? ?Most recent functional status assessment: ? ?  12/23/2021  ?  2:57 PM  ?In your present state of health, do you have any difficulty performing the following activities:  ?Hearing? 0  ?Vision? 0  ?Difficulty concentrating or making decisions? 0  ?Walking or climbing stairs? 0  ?Dressing or bathing? 0  ?Doing errands, shopping? 0  ? ?Most recent fall risk assessment: ? ?  12/23/2021  ?   2:57 PM  ?Fall Risk   ?Falls in the past year? 0  ?Number falls in past yr: 0  ?Injury with Fall? 0  ?Risk for fall due to : No Fall Risks  ?Follow up Falls evaluation completed  ? ? Most recent depression screenings:

## 2021-12-24 ENCOUNTER — Inpatient Hospital Stay
Admission: EM | Admit: 2021-12-24 | Discharge: 2021-12-27 | DRG: 379 | Disposition: A | Discharge status: Home / Self Care | Payer: Medicare Other | Attending: Internal Medicine | Admitting: Internal Medicine

## 2021-12-24 ENCOUNTER — Other Ambulatory Visit: Payer: Self-pay

## 2021-12-24 ENCOUNTER — Telehealth: Payer: Self-pay | Admitting: Family Medicine

## 2021-12-24 DIAGNOSIS — K449 Diaphragmatic hernia without obstruction or gangrene: Secondary | ICD-10-CM | POA: Diagnosis present

## 2021-12-24 DIAGNOSIS — K5521 Angiodysplasia of colon with hemorrhage: Secondary | ICD-10-CM | POA: Diagnosis present

## 2021-12-24 DIAGNOSIS — Z8546 Personal history of malignant neoplasm of prostate: Secondary | ICD-10-CM | POA: Diagnosis not present

## 2021-12-24 DIAGNOSIS — G4733 Obstructive sleep apnea (adult) (pediatric): Secondary | ICD-10-CM | POA: Diagnosis not present

## 2021-12-24 DIAGNOSIS — Z79899 Other long term (current) drug therapy: Secondary | ICD-10-CM | POA: Diagnosis not present

## 2021-12-24 DIAGNOSIS — K219 Gastro-esophageal reflux disease without esophagitis: Secondary | ICD-10-CM | POA: Diagnosis present

## 2021-12-24 DIAGNOSIS — Z87891 Personal history of nicotine dependence: Secondary | ICD-10-CM | POA: Diagnosis not present

## 2021-12-24 DIAGNOSIS — I700 Atherosclerosis of aorta: Secondary | ICD-10-CM | POA: Diagnosis present

## 2021-12-24 DIAGNOSIS — E039 Hypothyroidism, unspecified: Secondary | ICD-10-CM | POA: Diagnosis present

## 2021-12-24 DIAGNOSIS — F0390 Unspecified dementia without behavioral disturbance: Secondary | ICD-10-CM | POA: Diagnosis present

## 2021-12-24 DIAGNOSIS — Z79890 Hormone replacement therapy (postmenopausal): Secondary | ICD-10-CM

## 2021-12-24 DIAGNOSIS — Z7982 Long term (current) use of aspirin: Secondary | ICD-10-CM | POA: Diagnosis not present

## 2021-12-24 DIAGNOSIS — K5520 Angiodysplasia of colon without hemorrhage: Secondary | ICD-10-CM

## 2021-12-24 DIAGNOSIS — Z801 Family history of malignant neoplasm of trachea, bronchus and lung: Secondary | ICD-10-CM | POA: Diagnosis not present

## 2021-12-24 DIAGNOSIS — D649 Anemia, unspecified: Secondary | ICD-10-CM | POA: Diagnosis not present

## 2021-12-24 DIAGNOSIS — Z881 Allergy status to other antibiotic agents status: Secondary | ICD-10-CM

## 2021-12-24 DIAGNOSIS — I2510 Atherosclerotic heart disease of native coronary artery without angina pectoris: Secondary | ICD-10-CM | POA: Diagnosis present

## 2021-12-24 DIAGNOSIS — I129 Hypertensive chronic kidney disease with stage 1 through stage 4 chronic kidney disease, or unspecified chronic kidney disease: Secondary | ICD-10-CM | POA: Diagnosis present

## 2021-12-24 DIAGNOSIS — Z8249 Family history of ischemic heart disease and other diseases of the circulatory system: Secondary | ICD-10-CM | POA: Diagnosis not present

## 2021-12-24 DIAGNOSIS — D500 Iron deficiency anemia secondary to blood loss (chronic): Secondary | ICD-10-CM | POA: Diagnosis present

## 2021-12-24 DIAGNOSIS — Z888 Allergy status to other drugs, medicaments and biological substances status: Secondary | ICD-10-CM | POA: Diagnosis not present

## 2021-12-24 DIAGNOSIS — K2980 Duodenitis without bleeding: Secondary | ICD-10-CM | POA: Diagnosis present

## 2021-12-24 DIAGNOSIS — E785 Hyperlipidemia, unspecified: Secondary | ICD-10-CM | POA: Diagnosis not present

## 2021-12-24 DIAGNOSIS — K5730 Diverticulosis of large intestine without perforation or abscess without bleeding: Secondary | ICD-10-CM | POA: Diagnosis present

## 2021-12-24 DIAGNOSIS — N1831 Chronic kidney disease, stage 3a: Secondary | ICD-10-CM | POA: Diagnosis not present

## 2021-12-24 DIAGNOSIS — Z882 Allergy status to sulfonamides status: Secondary | ICD-10-CM | POA: Diagnosis not present

## 2021-12-24 LAB — COMPREHENSIVE METABOLIC PANEL
ALT: 12 IU/L (ref 0–44)
ALT: 13 U/L (ref 0–44)
AST: 15 IU/L (ref 0–40)
AST: 23 U/L (ref 15–41)
Albumin/Globulin Ratio: 1.9 (ref 1.2–2.2)
Albumin: 3.9 g/dL (ref 3.5–4.6)
Albumin: 3.3 g/dL — ABNORMAL LOW (ref 3.5–5.0)
Alkaline Phosphatase: 65 IU/L (ref 44–121)
Alkaline Phosphatase: 51 U/L (ref 38–126)
Anion gap: 9 (ref 5–15)
BUN/Creatinine Ratio: 15 (ref 10–24)
BUN: 23 mg/dL (ref 10–36)
BUN: 24 mg/dL — ABNORMAL HIGH (ref 8–23)
Bilirubin Total: <0.2 mg/dL (ref 0.0–1.2)
CO2: 20 mmol/L (ref 20–29)
CO2: 20 mmol/L — ABNORMAL LOW (ref 22–32)
Calcium: 8.7 mg/dL (ref 8.6–10.2)
Calcium: 8.4 mg/dL — ABNORMAL LOW (ref 8.9–10.3)
Chloride: 106 mmol/L (ref 96–106)
Chloride: 110 mmol/L (ref 98–111)
Creatinine, Ser: 1.52 mg/dL — ABNORMAL HIGH (ref 0.76–1.27)
Creatinine, Ser: 1.56 mg/dL — ABNORMAL HIGH (ref 0.61–1.24)
GFR, Estimated: 41 mL/min — ABNORMAL LOW (ref >60)
Globulin, Total: 2.1 g/dL (ref 1.5–4.5)
Glucose, Bld: 154 mg/dL — ABNORMAL HIGH (ref 70–99)
Glucose: 142 mg/dL — ABNORMAL HIGH (ref 70–99)
Potassium: 4.9 mmol/L (ref 3.5–5.2)
Potassium: 4.6 mmol/L (ref 3.5–5.1)
Sodium: 142 mmol/L (ref 134–144)
Sodium: 139 mmol/L (ref 135–145)
Total Bilirubin: 0.4 mg/dL (ref 0.3–1.2)
Total Protein: 6 g/dL (ref 6.0–8.5)
Total Protein: 6.5 g/dL (ref 6.5–8.1)
eGFR: 43 mL/min/{1.73_m2} — ABNORMAL LOW (ref >59)

## 2021-12-24 LAB — CBC
HCT: 17.1 % — ABNORMAL LOW (ref 39.0–52.0)
Hematocrit: 17 % — CL (ref 37.5–51.0)
Hemoglobin: 4.6 g/dL — CL (ref 13.0–17.7)
Hemoglobin: 4.4 g/dL — CL (ref 13.0–17.0)
MCH: 18.9 pg — ABNORMAL LOW (ref 26.6–33.0)
MCH: 18.3 pg — ABNORMAL LOW (ref 26.0–34.0)
MCHC: 27.1 g/dL — ABNORMAL LOW (ref 31.5–35.7)
MCHC: 25.7 g/dL — ABNORMAL LOW (ref 30.0–36.0)
MCV: 70 fL — ABNORMAL LOW (ref 79–97)
MCV: 71.3 fL — ABNORMAL LOW (ref 80.0–100.0)
Platelets: 294 10*3/uL (ref 150–450)
Platelets: 276 10*3/uL (ref 150–400)
RBC: 2.44 x10E6/uL — CL (ref 4.14–5.80)
RBC: 2.4 MIL/uL — ABNORMAL LOW (ref 4.22–5.81)
RDW: 17.2 % — ABNORMAL HIGH (ref 11.6–15.4)
RDW: 18.4 % — ABNORMAL HIGH (ref 11.5–15.5)
WBC: 7.3 10*3/uL (ref 3.4–10.8)
WBC: 6.2 10*3/uL (ref 4.0–10.5)
nRBC: 0 % (ref 0.0–0.2)

## 2021-12-24 LAB — HEMOGLOBIN AND HEMATOCRIT, BLOOD
HCT: 27.6 % — ABNORMAL LOW (ref 39.0–52.0)
Hemoglobin: 8.3 g/dL — ABNORMAL LOW (ref 13.0–17.0)

## 2021-12-24 LAB — LIPID PANEL
Chol/HDL Ratio: 4.4 ratio (ref 0.0–5.0)
Cholesterol, Total: 124 mg/dL (ref 100–199)
HDL: 28 mg/dL — ABNORMAL LOW (ref >39)
LDL Chol Calc (NIH): 55 mg/dL (ref 0–99)
Triglycerides: 261 mg/dL — ABNORMAL HIGH (ref 0–149)
VLDL Cholesterol Cal: 41 mg/dL — ABNORMAL HIGH (ref 5–40)

## 2021-12-24 LAB — TSH
TSH: 0.629 u[IU]/mL (ref 0.450–4.500)
TSH: 0.685 u[IU]/mL (ref 0.350–4.500)

## 2021-12-24 LAB — IRON AND TIBC
Iron: 129 ug/dL (ref 45–182)
Saturation Ratios: 28 % (ref 17.9–39.5)
TIBC: 455 ug/dL — ABNORMAL HIGH (ref 250–450)
UIBC: 326 ug/dL

## 2021-12-24 LAB — FERRITIN: Ferritin: 4 ng/mL — ABNORMAL LOW (ref 24–336)

## 2021-12-24 LAB — PREPARE RBC (CROSSMATCH)

## 2021-12-24 MED ORDER — SODIUM FLUORIDE 1.1 % DT GEL: 1.0000 "application " | Freq: Two times a day (BID) | DENTAL | Status: DC

## 2021-12-24 MED ORDER — ADULT MULTIVITAMIN W/MINERALS CH
1.0000 | ORAL_TABLET | Freq: Every day | ORAL | Status: DC
  Administered 2021-12-24 – 2021-12-27 (×3): 1 via ORAL
  Filled 2021-12-24 (×3): qty 1

## 2021-12-24 MED ORDER — SODIUM CHLORIDE 0.9% IV SOLUTION
Freq: Once | INTRAVENOUS | Status: AC
  Filled 2021-12-24: qty 250

## 2021-12-24 MED ORDER — PRAVASTATIN SODIUM 20 MG PO TABS
40.0000 mg | ORAL_TABLET | Freq: Every day | ORAL | Status: DC
  Administered 2021-12-24 – 2021-12-27 (×3): 40 mg via ORAL
  Filled 2021-12-24 (×3): qty 2

## 2021-12-24 MED ORDER — ACETAMINOPHEN 325 MG PO TABS: 650.0000 mg | ORAL_TABLET | Freq: Four times a day (QID) | ORAL | Status: DC | PRN

## 2021-12-24 MED ORDER — ONDANSETRON HCL 4 MG/2ML IJ SOLN: 4.0000 mg | Freq: Four times a day (QID) | INTRAMUSCULAR | Status: DC | PRN

## 2021-12-24 MED ORDER — ALBUTEROL SULFATE (2.5 MG/3ML) 0.083% IN NEBU: 2.5000 mg | INHALATION_SOLUTION | RESPIRATORY_TRACT | Status: DC | PRN

## 2021-12-24 MED ORDER — DONEPEZIL HCL 5 MG PO TABS
10.0000 mg | ORAL_TABLET | Freq: Every day | ORAL | Status: DC
  Administered 2021-12-24 – 2021-12-26 (×3): 10 mg via ORAL
  Filled 2021-12-24 (×3): qty 2

## 2021-12-24 MED ORDER — ACETAMINOPHEN 650 MG RE SUPP
650.0000 mg | Freq: Four times a day (QID) | RECTAL | Status: DC | PRN
Start: 2021-12-24 — End: 2021-12-27

## 2021-12-24 MED ORDER — METOPROLOL SUCCINATE ER 25 MG PO TB24
12.5000 mg | ORAL_TABLET | Freq: Every day | ORAL | Status: DC
  Administered 2021-12-25 – 2021-12-27 (×2): 12.5 mg via ORAL
  Filled 2021-12-24 (×2): qty 1

## 2021-12-24 MED ORDER — SODIUM CHLORIDE 0.9 % IV SOLN
10.0000 mL/h | Freq: Once | INTRAVENOUS | Status: AC
Start: 2021-12-24 — End: 2021-12-24
  Administered 2021-12-24: 10 mL/h via INTRAVENOUS

## 2021-12-24 MED ORDER — MEMANTINE HCL 5 MG PO TABS
10.0000 mg | ORAL_TABLET | Freq: Two times a day (BID) | ORAL | Status: DC
  Administered 2021-12-24 – 2021-12-27 (×5): 10 mg via ORAL
  Filled 2021-12-24 (×5): qty 2

## 2021-12-24 MED ORDER — ISOSORBIDE MONONITRATE ER 30 MG PO TB24
60.0000 mg | ORAL_TABLET | Freq: Every day | ORAL | Status: DC
Start: 2021-12-25 — End: 2021-12-27
  Administered 2021-12-25 – 2021-12-27 (×2): 60 mg via ORAL
  Filled 2021-12-24 (×2): qty 2

## 2021-12-24 MED ORDER — BRIMONIDINE TARTRATE 0.2 % OP SOLN
1.0000 [drp] | Freq: Two times a day (BID) | OPHTHALMIC | Status: DC
  Administered 2021-12-24 – 2021-12-27 (×6): 1 [drp] via OPHTHALMIC
  Filled 2021-12-24: qty 5

## 2021-12-24 MED ORDER — ONDANSETRON HCL 4 MG PO TABS: 4.0000 mg | ORAL_TABLET | Freq: Four times a day (QID) | ORAL | Status: DC | PRN

## 2021-12-24 MED ORDER — LEVOTHYROXINE SODIUM 88 MCG PO TABS
88.0000 ug | ORAL_TABLET | Freq: Every day | ORAL | Status: DC
  Administered 2021-12-25 – 2021-12-27 (×3): 88 ug via ORAL
  Filled 2021-12-24 (×3): qty 1

## 2021-12-24 MED ORDER — PANTOPRAZOLE SODIUM 40 MG IV SOLR
40.0000 mg | Freq: Two times a day (BID) | INTRAVENOUS | Status: DC
  Administered 2021-12-24 – 2021-12-27 (×6): 40 mg via INTRAVENOUS
  Filled 2021-12-24 (×6): qty 10

## 2021-12-24 NOTE — Telephone Encounter (Signed)
Pt returned call. I asked pt about a nurse calling him about a critical lab level. He states he did not receive a call about that. I asked him how he was feeling and he said he was having no symptoms. Heart rate normal, not dizzy when stands up. Advised pt to have wife drive him to the ED because he likely needs a blood transfusion. Pt states his wife can drive him and they will go to [facility] ED now. ?

## 2021-12-24 NOTE — H&P (Signed)
?History and Physical  ? ? ?Roger Cook IPJ:[id number] DOB: Aug 05, 1927 DOA: 12/24/2021 ? ?PCP: [name], MD  ?Patient coming from: home ? ?I have personally briefly reviewed patient's old medical records in [place] ? ?Chief Complaint: abn labs, noting low hgb 4.6 ? ?HPI: Roger Cook is a 88 y.o. male  with medical history significant of HTN, hypothyroidism, Dementia w/o behavioral disturbance, CAD  followed by dr Clayborn Bigness, GERD, OSA on CPAP who presents to ED sent in by pcp for low hgb on routine labs of 4.6. Of note patient has had low hgb in the past and diagnosed with IDA and  at that time plan was to start patient  on iron. Patient currently denies any dark stools, blood in stools or other types of bleeding.  He notes no sob/ chest pain/ palpitations, presyncope, n/v/d or abdominal pain. He wife adds to history and noted he has had decrease activity over the last 2-3 weeks. She also notes that he has loose stools. Patient states he has two bowel movements per day and that they are loose. He again denies any black stools, or blood.  ?Of note patient initially did not agree with having Gi evaluation but on further discussion has agreed to have GI evaluation. ? ?ED Course:  ?Vitals : afeb, bp 146/48, hr 83, rr 18 sat 97% on ra  ?Labs:  wbc 6.2, hgb 4.4, mcv 71 plt 276 ?NA139, K 4.6, bicarb 20, gly 154, cr 1.56 at baseline ? ?Transfuse 2 PRBC in ED ?Review of Systems: As per HPI otherwise 10 point review of systems negative.  ? ?Past Medical History:  ?Diagnosis Date  ? Anemia   ? Aortic atherosclerosis ([place])   ? Bell palsy   ? Bilateral renal cysts   ? Bladder tumor   ? CAD (coronary artery disease)   ? DOE (dyspnea on exertion)   ? GERD (gastroesophageal reflux disease)   ? History of angina   ? History of penile implant   ? History of prostate cancer   ? Hyperlipidemia   ? Hypertension   ? Hypothyroidism   ? Lipoma   ? MRSA bacteremia 02/2011  ? Nephrolithiasis   ? OSA on CPAP   ? Pneumonia    ? as a teenager  ? ? ?Past Surgical History:  ?Procedure Laterality Date  ? APPENDECTOMY  2005  ? CARDIAC CATHETERIZATION N/A 08/13/1990  ? 75% pLCx, 75% mLCx, 25% mLAD, 75% D2, 50% right renal artery; Location: [facility]; Surgeon: Doristine Bosworth, MD  ? CATARACT EXTRACTION  2005  ? also had a macular hole in 2005  ? CATARACT EXTRACTION  2008  ? COLONOSCOPY    ? 2003, 2014  ? ESOPHAGOGASTRODUODENOSCOPY    ? 2004, 2014  ? HAND SURGERY Left 06/26/2011  ? Malignancy removed from left hand  ? NASAL SEPTUM SURGERY  1986  ? NASAL SINUS SURGERY  1990  ? penile inplant  1984  ? polyp of rectum  2003  ? [place]  ? Abdominal, had to have radiation treatment with the procedure  ? PROSTATE SURGERY    ? TONSILLECTOMY    ? TRANSURETHRAL RESECTION OF BLADDER TUMOR N/A 03/12/2021  ? Procedure: TRANSURETHRAL RESECTION OF BLADDER TUMOR (TURBT);  Surgeon: Abbie Sons, MD;  Location: [facility] OR[facility];  Service: Urology;  Laterality: N/A;  ? ? ? reports that he quit smoking about 59 years ago. His smoking use included cigarettes. He has a 16.00 pack-year smoking history.  He has never used smokeless tobacco. He reports current alcohol use of about 2.0 standard drinks per week. He reports that he does not use drugs. ? ?Allergies  ?Allergen Reactions  ? Levofloxacin Swelling  ?  lips swelling  ? Povidone Iodine   ?  Unknown, can not remember  ? Sulfa Antibiotics   ?  Unknown, can not remember  ? Benadryl [Diphenhydramine] Rash  ? ? ?Family History  ?Problem Relation Age of Onset  ? Lung cancer Mother   ? Heart disease Father   ? ? ?Prior to Admission medications   ?Medication Sig Start Date End Date Taking? Authorizing Provider  ?Apoaequorin (PREVAGEN) 10 MG CAPS Take by mouth.   Yes [provider]  ?ARTIFICIAL TEAR SOLUTION OP Place 1 drop into both eyes 2 (two) times daily as needed (dry eyes).   Yes [provider]  ?aspirin 81 MG tablet Take 81 mg by mouth daily.  01/05/09  Yes [provider]   ?brimonidine (ALPHAGAN) 0.2 % ophthalmic solution Place 1 drop into the left eye 2 (two) times daily. 01/25/21  Yes [provider]  ?Calcium Carbonate-Vit D-Min (CALCIUM 1200 PO) Take by mouth.   Yes [provider]  ?donepezil (ARICEPT) 10 MG tablet TAKE 1 TABLET BY MOUTH AT BEDTIME 06/20/21  Yes [name], MD  ?fluticasone (FLONASE) 50 MCG/ACT nasal spray Place 2 sprays into both nostrils daily as needed for allergies. 09/25/21  Yes [name], MD  ?isosorbide mononitrate (IMDUR) 60 MG 24 hr tablet Take 1 tablet (60 mg total) by mouth daily. 07/02/21  Yes [name], MD  ?levothyroxine (SYNTHROID) 88 MCG tablet Take 1 tablet (88 mcg total) by mouth daily before breakfast. 07/26/21  Yes [name], MD  ?memantine (NAMENDA) 10 MG tablet TAKE 1 TABLET BY MOUTH TWO TIMES DAILY 11/19/21  Yes [name], MD  ?metoprolol succinate (TOPROL-XL) 25 MG 24 hr tablet Take 12.5 mg by mouth daily. 01/25/21  Yes [provider]  ?MULTIPLE VITAMIN PO Take 1 tablet by mouth daily.   Yes [provider]  ?pravastatin (PRAVACHOL) 40 MG tablet TAKE 1 TABLET BY MOUTH DAILY 11/24/21  Yes [name], MD  ?PREVIDENT 5000 DRY MOUTH 1.1 % GEL dental gel Place 1 application onto teeth 2 (two) times daily. 02/19/21  Yes [provider]  ?NIFEdipine (PROCARDIA-XL/NIFEDICAL-XL) 30 MG 24 hr tablet Take 30 mg by mouth daily. ?Patient not taking: Reported on 12/24/2021 07/11/21   [provider]  ?NON FORMULARY CPAP (Free Text) - Historical Medication  As directed  Started 22-Sep-1994 Active 09/22/1994   [provider]  ? ? ?Physical Exam: ?Vitals:  ? 12/24/21 1145 12/24/21 1145 12/24/21 1146 12/24/21 1348  ?BP:  (!) 146/48  (!) 145/53  ?Pulse: 83 83  65  ?Resp:  18  17  ?Temp: 97.7 ?F (36.5 ?C) 97.7 ?F (36.5 ?C)  97.9 ?F (36.6 ?C)  ?TempSrc: Oral Oral  Oral  ?SpO2:  97%  100%  ?Weight:   68 kg   ?Height:   '5\' 6"'$  (1.676 m)   ? ? ? ?Vitals:  ? 12/24/21 1145  12/24/21 1145 12/24/21 1146 12/24/21 1348  ?BP:  (!) 146/48  (!) 145/53  ?Pulse: 83 83  65  ?Resp:  18  17  ?Temp: 97.7 ?F (36.5 ?C) 97.7 ?F (36.5 ?C)  97.9 ?F (36.6 ?C)  ?TempSrc: Oral Oral  Oral  ?SpO2:  97%  100%  ?Weight:   68  kg   ?Height:   '5\' 6"'$  (1.676 m)   ?Constitutional: NAD, calm, comfortable (pink does not appear pale) ?Eyes: PERRL, lids and conjunctivae normal ?ENMT: Mucous membranes are moist. Posterior pharynx clear of any exudate or lesions.Normal dentition.  ?Neck: normal, supple, no masses, no thyromegaly ?Respiratory: clear to auscultation bilaterally, no wheezing, no crackles. Normal respiratory effort. No accessory muscle use.  ?Cardiovascular: Regular rate and rhythm, no murmurs / rubs / gallops. +trace extremity edema. 2+ pedal pulses. No carotid bruits.  ?Abdomen: no tenderness, no masses palpated. No hepatosplenomegaly. Bowel sounds positive.  ?Musculoskeletal: no clubbing / cyanosis. No joint deformity upper and lower extremities. Good ROM, no contractures. Normal muscle tone.  ?Skin: no rashes, lesions, ulcers. No induration ?Neurologic: CN 2-12 grossly intact. Sensation intact,  Strength 5/5 in all 4.  ?Psychiatric: Normal judgment and insight. Alert and oriented x 3. Normal mood.  ? ? ?Labs on Admission: I have personally reviewed following labs and imaging studies ? ?CBC: ?Recent Labs  ?Lab 12/23/21 ?1532 12/24/21 ?1148  ?WBC 7.3 6.2  ?HGB 4.6* 4.4*  ?HCT 17.0* 17.1*  ?MCV 70* 71.3*  ?PLT 294 276  ? ?Basic Metabolic Panel: ?Recent Labs  ?Lab 12/23/21 ?1532 12/24/21 ?1148  ?NA 142 139  ?K 4.9 4.6  ?CL 106 110  ?CO2 20 20*  ?GLUCOSE 142* 154*  ?BUN 23 24*  ?CREATININE 1.52* 1.56*  ?CALCIUM 8.7 8.4*  ? ?GFR: ?Estimated Creatinine Clearance: 27.3 mL/min (A) (by C-G formula based on SCr of 1.56 mg/dL (H)). ?Liver Function Tests: ?Recent Labs  ?Lab 12/23/21 ?1532 12/24/21 ?1148  ?AST 15 23  ?ALT 12 13  ?ALKPHOS 65 51  ?BILITOT <0.2 0.4  ?PROT 6.0 6.5  ?ALBUMIN 3.9 3.3*  ? ?No results for  input(s): LIPASE, AMYLASE in the last 168 hours. ?No results for input(s): AMMONIA in the last 168 hours. ?Coagulation Profile: ?No results for input(s): INR, PROTIME in the last 168 hours. ?Cardiac Enzymes: ?No results

## 2021-12-24 NOTE — Telephone Encounter (Signed)
On call RN called with critical lab value of Hgb 4.6.  She spoke to patient and he was asymptomatic, but she advised that he proceed to the ED given this significantly low hemoglobin. ? ?FYI to PCP ?

## 2021-12-24 NOTE — ED Notes (Signed)
Informed RN bed assigned 

## 2021-12-24 NOTE — ED Triage Notes (Addendum)
Patient to the ER via POV, reports having labs drawn yesterday and having a hemoglobin result of 4.6. ? ?Patient pale in appearance. Denies GI bleeding or other bleeding. No falls. Denies blood thinner usage. No dizziness.  ?

## 2021-12-24 NOTE — ED Provider Notes (Signed)
? ?[facility] ?Provider Note ? ? ? Event Date/Time  ? First MD Initiated Contact with Patient 12/24/21 1224   ?  (approximate) ? ? ?History  ? ?Chief Complaint ?Abnormal Labs ? ?HPI ? ?Roger Cook is a 88 y.o. male with past medical history of hypertension, CAD, CKD, and dementia who presents to the ED for abnormal labs.  Patient reports that he was sent to the ED for further evaluation after being told by his PCP that his hemoglobin is low.  Patient states that he feels fine and denies any chest pain, shortness of breath, lightheadedness, or weakness.  Daughter reports that he has dealt with low hemoglobin in the past and required transfusion sometime last year.  Patient does not take any blood thinners and states he has not noticed any blood in his stool or dark tarry stool.  He has not noticed any blood in his urine either.  Patient apparently was found to have low hemoglobin back in November and was prescribed iron at that time, however he is unsure whether he is taking this. ?  ? ? ?Physical Exam  ? ?Triage Vital Signs: ?ED Triage Vitals  ?Enc Vitals Group  ?   BP 12/24/21 1145 (!) 146/48  ?   Pulse Rate 12/24/21 1145 83  ?   Resp 12/24/21 1145 18  ?   Temp 12/24/21 1145 97.7 ?F (36.5 ?C)  ?   Temp Source 12/24/21 1145 Oral  ?   SpO2 12/24/21 1145 97 %  ?   Weight 12/24/21 1146 150 lb (68 kg)  ?   Height 12/24/21 1146 '5\' 6"'$  (1.676 m)  ?   Head Circumference --   ?   Peak Flow --   ?   Pain Score 12/24/21 1146 0  ?   Pain Loc --   ?   Pain Edu? --   ?   Excl. in [place]? --   ? ? ?Most recent vital signs: ?Vitals:  ? 12/24/21 1145 12/24/21 1145  ?BP:  (!) 146/48  ?Pulse: 83 83  ?Resp:  18  ?Temp: 97.7 ?F (36.5 ?C) 97.7 ?F (36.5 ?C)  ?SpO2:  97%  ? ? ?Constitutional: Alert and oriented. ?Eyes: Conjunctivae are normal. ?Head: Atraumatic. ?Nose: No congestion/rhinnorhea. ?Mouth/Throat: Mucous membranes are moist.  ?Cardiovascular: Normal rate, regular rhythm. Grossly normal heart sounds.  2+  radial pulses bilaterally. ?Respiratory: Normal respiratory effort.  No retractions. Lungs CTAB. ?Gastrointestinal: Soft and nontender. No distention.  Rectal exam with light brown, faintly guaiac positive stool. ?Musculoskeletal: No lower extremity tenderness nor edema.  ?Neurologic:  Normal speech and language. No gross focal neurologic deficits are appreciated. ? ? ? ?ED Results / Procedures / Treatments  ? ?Labs ?(all labs ordered are listed, but only abnormal results are displayed) ?Labs Reviewed  ?COMPREHENSIVE METABOLIC PANEL - Abnormal; Notable for the following components:  ?    Result Value  ? CO2 20 (*)   ? Glucose, Bld 154 (*)   ? BUN 24 (*)   ? Creatinine, Ser 1.56 (*)   ? Calcium 8.4 (*)   ? Albumin 3.3 (*)   ? GFR, Estimated 41 (*)   ? All other components within normal limits  ?CBC - Abnormal; Notable for the following components:  ? RBC 2.40 (*)   ? Hemoglobin 4.4 (*)   ? HCT 17.1 (*)   ? MCV 71.3 (*)   ? MCH 18.3 (*)   ? MCHC 25.7 (*)   ?  RDW 18.4 (*)   ? All other components within normal limits  ?POC OCCULT BLOOD, ED  ?TYPE AND SCREEN  ?PREPARE RBC (CROSSMATCH)  ? ? ? ?PROCEDURES: ? ?Critical Care performed: Yes, see critical care procedure note(s) ? ?.Critical Care ?Performed by: Blake Divine, MD ?Authorized by: Blake Divine, MD  ? ?Critical care provider statement:  ?  Critical care time (minutes):  30 ?  Critical care time was exclusive of:  Separately billable procedures and treating other patients and teaching time ?  Critical care was necessary to treat or prevent imminent or life-threatening deterioration of the following conditions:  Circulatory failure ?  Critical care was time spent personally by me on the following activities:  Development of treatment plan with patient or surrogate, discussions with consultants, evaluation of patient's response to treatment, examination of patient, ordering and review of laboratory studies, ordering and review of radiographic studies, ordering  and performing treatments and interventions, pulse oximetry, re-evaluation of patient's condition and review of old charts ?  I assumed direction of critical care for this patient from another provider in my specialty: no   ?  Care discussed with: admitting provider   ? ? ?MEDICATIONS ORDERED IN ED: ?Medications  ?0.9 %  sodium chloride infusion (has no administration in time range)  ? ? ? ?IMPRESSION / MDM / ASSESSMENT AND PLAN / ED COURSE  ?I reviewed the triage vital signs and the nursing notes. ?             ?               ? ?88 y.o. male with past medical history of hypertension, CAD, CKD, and dementia who presents to the ED for low hemoglobin noted on outpatient labs. ? ?Differential diagnosis includes, but is not limited to, GI bleed, hematuria, iron deficiency anemia, and malignancy. ? ?Patient nontoxic-appearing and in no acute distress, vital signs are unremarkable.  Patient does have faintly guaiac positive stool, however any bleeding appears likely to be slow and chronic given gradual downtrending hemoglobin from November.  We will transfuse 2 units PRBCs for now, additional labs are reassuring with stable chronic kidney disease and no acute electrolyte abnormality.  He would benefit from admission for observation due to severe anemia and to ensure his hemoglobin stabilizes.  Case discussed with hospitalist for admission. ? ?  ? ? ?FINAL CLINICAL IMPRESSION(S) / ED DIAGNOSES  ? ?Final diagnoses:  ?Anemia, unspecified type  ? ? ? ?Rx / DC Orders  ? ?ED Discharge Orders   ? ? None  ? ?  ? ? ? ?Note:  This document was prepared using Dragon voice recognition software and may include unintentional dictation errors. ?  ?Blake Divine, MD ?12/24/21 1323 ? ?

## 2021-12-24 NOTE — ED Notes (Signed)
MD Charna Archer informed of hgb 4.4 ?

## 2021-12-25 DIAGNOSIS — D649 Anemia, unspecified: Secondary | ICD-10-CM | POA: Diagnosis not present

## 2021-12-25 LAB — TYPE AND SCREEN
ABO/RH(D): AB POS
Antibody Screen: NEGATIVE
Unit division: 0
Unit division: 0
Unit division: 0

## 2021-12-25 LAB — BASIC METABOLIC PANEL
Anion gap: 2 — ABNORMAL LOW (ref 5–15)
BUN: 19 mg/dL (ref 8–23)
CO2: 22 mmol/L (ref 22–32)
Calcium: 8.1 mg/dL — ABNORMAL LOW (ref 8.9–10.3)
Chloride: 115 mmol/L — ABNORMAL HIGH (ref 98–111)
Creatinine, Ser: 1.29 mg/dL — ABNORMAL HIGH (ref 0.61–1.24)
GFR, Estimated: 52 mL/min — ABNORMAL LOW (ref >60)
Glucose, Bld: 102 mg/dL — ABNORMAL HIGH (ref 70–99)
Potassium: 4.2 mmol/L (ref 3.5–5.1)
Sodium: 139 mmol/L (ref 135–145)

## 2021-12-25 LAB — BPAM RBC
Blood Product Expiration Date: 202304152359
Blood Product Expiration Date: 202304162359
Blood Product Expiration Date: 202305042359
ISSUE DATE / TIME: 202304041353
ISSUE DATE / TIME: 202304041609
ISSUE DATE / TIME: 202304042034
Unit Type and Rh: 1700
Unit Type and Rh: 600
Unit Type and Rh: 6200

## 2021-12-25 LAB — FOLATE: Folate: 42 ng/mL (ref >5.9)

## 2021-12-25 LAB — CBC
HCT: 28.7 % — ABNORMAL LOW (ref 39.0–52.0)
Hemoglobin: 9 g/dL — ABNORMAL LOW (ref 13.0–17.0)
MCH: 23.7 pg — ABNORMAL LOW (ref 26.0–34.0)
MCHC: 31.4 g/dL (ref 30.0–36.0)
MCV: 75.7 fL — ABNORMAL LOW (ref 80.0–100.0)
Platelets: 197 10*3/uL (ref 150–400)
RBC: 3.79 MIL/uL — ABNORMAL LOW (ref 4.22–5.81)
RDW: 19.9 % — ABNORMAL HIGH (ref 11.5–15.5)
WBC: 7 10*3/uL (ref 4.0–10.5)
nRBC: 0 % (ref 0.0–0.2)

## 2021-12-25 LAB — VITAMIN B12: Vitamin B-12: 440 pg/mL (ref 180–914)

## 2021-12-25 MED ORDER — SODIUM CHLORIDE 0.9 % IV SOLN: INTRAVENOUS | Status: DC

## 2021-12-25 MED ORDER — PEG 3350-KCL-NA BICARB-NACL 420 G PO SOLR
4000.0000 mL | Freq: Once | ORAL | Status: AC
  Administered 2021-12-25: 4000 mL via ORAL
  Filled 2021-12-25: qty 4000

## 2021-12-25 NOTE — Progress Notes (Signed)
[place] [facility] ? ? ?PATIENT NAME: Roger Cook   ? ?MR#:  [id number] ? ?DATE OF BIRTH:  July 28, 1927 ? ?SUBJECTIVE:  ? ?patient came in after he was informed by PCP that his hemoglobin was low. He denied any symptoms of shortness of breath excessive sleepiness any dark colored stools or any issues with active bleeding.  ?Status post three units of blood transfusion. No family at bedside ?VITALS:  ?Blood pressure (!) 166/60, pulse (!) 59, temperature 98.1 ?F (36.7 ?C), resp. rate 17, height '5\' 6"'$  (1.676 m), weight 68 kg, SpO2 96 %. ? ?PHYSICAL EXAMINATION:  ? ?GENERAL:  88 y.o.-year-old patient lying in the bed with no acute distress.  ?LUNGS: Normal breath sounds bilaterally, no wheezing, rales, rhonchi.  ?CARDIOVASCULAR: S1, S2 normal. No murmurs, rubs, or gallops.  ?ABDOMEN: Soft, nontender, nondistended. Bowel sounds present.  ?EXTREMITIES: No  edema b/l.    ?NEUROLOGIC: nonfocal  patient is alert and awake ?SKIN: No obvious rash, lesion, or ulcer.  ? ?LABORATORY PANEL:  ?CBC ?Recent Labs  ?Lab 12/25/21 ?0408  ?WBC 7.0  ?HGB 9.0*  ?HCT 28.7*  ?PLT 197  ? ? ?Chemistries  ?Recent Labs  ?Lab 12/24/21 ?1148 12/25/21 ?0408  ?NA 139 139  ?K 4.6 4.2  ?CL 110 115*  ?CO2 20* 22  ?GLUCOSE 154* 102*  ?BUN 24* 19  ?CREATININE 1.56* 1.29*  ?CALCIUM 8.4* 8.1*  ?AST 23  --   ?ALT 13  --   ?ALKPHOS 51  --   ?BILITOT 0.4  --   ? ?Assessment and Plan ? ? Roger Cook is a 88 y.o. male  with medical history significant of HTN, hypothyroidism, Dementia w/o behavioral disturbance, CAD  followed by Roger Cook, GERD, OSA on CPAP who presents to ED sent in by pcp for low hgb on routine labs of 4.6. ? ?Severe microcytic anemia-- no active bleeding ?-- minimal symptoms only decreased activity. Denies shortness of breath or chest pain.-- History of iron deficiency anemia hemoglobin around six in November 2020 ?-- patient came in with hemoglobin of 4.6--- three unit blood transfusion-- 8.3-- 9.0 ?--  colonoscopy in 2014 showed Colonic angiectasias and polpys ?-- seen by G.I. Patient agreeable for upper endoscopy and colonoscopy which will be done tomorrow. ? ?Hypertension ?-- stable ? ?Hypothyroidism ?-- continue Synthroid ? ?Jerrye Bushy ?-- continue PPI ? ?CAD ?-- no active issues ? ?Procedures: ?Family communication :wife Roger Cook ?Consults :GI Roger Cook ?CODE STATUS: FULL ?DVT Prophylaxis :SCD ?Level of care: Med-Surg ?Status is: Inpatient ?Remains inpatient appropriate because: awaiting G.I. luminal evaluation ?  ? ?TOTAL TIME TAKING CARE OF THIS PATIENT: 25 minutes.  ?>50% time spent on counselling and coordination of care ? ?Note: This dictation was prepared with Dragon dictation along with smaller phrase technology. Any transcriptional errors that result from this process are unintentional. ? ?Fritzi Mandes M.D  ? ? ?[facility]  ? ?CC: ?Primary care physician; Roger Sons, MD  ?

## 2021-12-25 NOTE — Plan of Care (Signed)

## 2021-12-25 NOTE — Consult Note (Signed)
?  ?Roger Cook , MD ?[address] [place], [place], [place], [address] ?[address], [place], [place], [place], [address] ?Phone: [phone]  ?Fax: [phone] ? Consultation ? ?Referring Provider:     Dr Marcello Moores ?Primary Care Physician:  [name], MD ?Primary Gastroenterologist:  None         ?Reason for Consultation:     Anemia ? ?Date of Admission:  12/24/2021 ?Date of Consultation:  12/25/2021 ?       ? HPI:   ?Roger Cook is a 88 y.o. male who was seen previously back in 2014 by Dr. Vira Agar for iron deficiency anemia not seen subsequently presented to the emergency room on 12/24/2021 with hemoglobin of 4.6 g on a routine lab check.  Denies any overt blood loss. ?Denies any abdominal pain.  Denies any change in bowel habits.  Denies any black-colored stools.  Denies any change in shape of his stools.  He has been taking Excedrin 1 tablet daily for many years.  No other GI complaints.  Denies any nosebleeds. ? ?Looking back at his labs he has had a microcytic anemia for at least 10 months if not longer hemoglobin in the 7 g range.  Even back in 2014 hemoglobin was 9.2 g.  In 2021 hemoglobin was 8.4 g with MCV of 85 and in November 2022 hemoglobin was 6.1 g with an MCV of 78. ? ?12/24/2021 ferritin of 4.  No B12 or folate recently checked.  TSH normal.  No recent B12 folate levels checked.  If urine analysis last checked 9 months back showed small amount of blood in the urine.  The patient carries a diagnosis of dementia but is well-functioning.  He has had a colonoscopy in 2013.  Showed internal hemorrhoids and a few nonbleeding colonic angiectasia's that were documented. ?Past Medical History:  ?Diagnosis Date  ? Anemia   ? Aortic atherosclerosis ([place])   ? Bell palsy   ? Bilateral renal cysts   ? Bladder tumor   ? CAD (coronary artery disease)   ? DOE (dyspnea on exertion)   ? GERD (gastroesophageal reflux disease)   ? History of angina   ? History of penile implant   ? History of prostate cancer   ?  Hyperlipidemia   ? Hypertension   ? Hypothyroidism   ? Lipoma   ? MRSA bacteremia 02/2011  ? Nephrolithiasis   ? OSA on CPAP   ? Pneumonia   ? as a teenager  ? ? ?Past Surgical History:  ?Procedure Laterality Date  ? APPENDECTOMY  2005  ? CARDIAC CATHETERIZATION N/A 08/13/1990  ? 75% pLCx, 75% mLCx, 25% mLAD, 75% D2, 50% right renal artery; Location: [facility]; Surgeon: Doristine Bosworth, MD  ? CATARACT EXTRACTION  2005  ? also had a macular hole in 2005  ? CATARACT EXTRACTION  2008  ? COLONOSCOPY    ? 2003, 2014  ? ESOPHAGOGASTRODUODENOSCOPY    ? 2004, 2014  ? HAND SURGERY Left 06/26/2011  ? Malignancy removed from left hand  ? NASAL SEPTUM SURGERY  1986  ? NASAL SINUS SURGERY  1990  ? penile inplant  1984  ? polyp of rectum  2003  ? [place]  ? Abdominal, had to have radiation treatment with the procedure  ? PROSTATE SURGERY    ? TONSILLECTOMY    ? TRANSURETHRAL RESECTION OF BLADDER TUMOR N/A 03/12/2021  ? Procedure: TRANSURETHRAL RESECTION OF BLADDER TUMOR (TURBT);  Surgeon: Abbie Sons, MD;  Location: [facility] ORS;  Service: Urology;  Laterality: N/A;  ? ? ?Prior to Admission medications   ?Medication Sig Start Date End Date Taking? Authorizing Provider  ?Apoaequorin (PREVAGEN) 10 MG CAPS Take by mouth.   Yes [provider]  ?ARTIFICIAL TEAR SOLUTION OP Place 1 drop into both eyes 2 (two) times daily as needed (dry eyes).   Yes [provider]  ?aspirin 81 MG tablet Take 81 mg by mouth daily.  01/05/09  Yes [provider]  ?brimonidine (ALPHAGAN) 0.2 % ophthalmic solution Place 1 drop into the left eye 2 (two) times daily. 01/25/21  Yes [provider]  ?Calcium Carbonate-Vit D-Min (CALCIUM 1200 PO) Take by mouth.   Yes [provider]  ?donepezil (ARICEPT) 10 MG tablet TAKE 1 TABLET BY MOUTH AT BEDTIME 06/20/21  Yes [name], MD  ?fluticasone (FLONASE) 50 MCG/ACT nasal spray Place 2 sprays into both nostrils daily as needed for allergies. 09/25/21  Yes  [name], MD  ?isosorbide mononitrate (IMDUR) 60 MG 24 hr tablet Take 1 tablet (60 mg total) by mouth daily. 07/02/21  Yes [name], MD  ?levothyroxine (SYNTHROID) 88 MCG tablet Take 1 tablet (88 mcg total) by mouth daily before breakfast. 07/26/21  Yes [name], MD  ?memantine (NAMENDA) 10 MG tablet TAKE 1 TABLET BY MOUTH TWO TIMES DAILY 11/19/21  Yes [name], MD  ?metoprolol succinate (TOPROL-XL) 25 MG 24 hr tablet Take 12.5 mg by mouth daily. 01/25/21  Yes [provider]  ?MULTIPLE VITAMIN PO Take 1 tablet by mouth daily.   Yes [provider]  ?pravastatin (PRAVACHOL) 40 MG tablet TAKE 1 TABLET BY MOUTH DAILY 11/24/21  Yes [name], MD  ?PREVIDENT 5000 DRY MOUTH 1.1 % GEL dental gel Place 1 application onto teeth 2 (two) times daily. 02/19/21  Yes [provider]  ?NIFEdipine (PROCARDIA-XL/NIFEDICAL-XL) 30 MG 24 hr tablet Take 30 mg by mouth daily. ?Patient not taking: Reported on 12/24/2021 07/11/21   [provider]  ?NON FORMULARY CPAP (Free Text) - Historical Medication  As directed  Started 22-Sep-1994 Active 09/22/1994   [provider]  ? ? ?Family History  ?Problem Relation Age of Onset  ? Lung cancer Mother   ? Heart disease Father   ?  ? ?Social History  ? ?Tobacco Use  ? Smoking status: Former  ?  Packs/day: 1.00  ?  Years: 16.00  ?  Pack years: 16.00  ?  Types: Cigarettes  ?  Quit date: 1964  ?  Years since quitting: 59.2  ? Smokeless tobacco: Never  ?Vaping Use  ? Vaping Use: Never used  ?Substance Use Topics  ? Alcohol use: Yes  ?  Alcohol/week: 2.0 standard drinks  ?  Types: 2 Glasses of wine per week  ? Drug use: No  ? ? ?Allergies as of 12/24/2021 - Review Complete 12/24/2021  ?Allergen Reaction Noted  ? Levofloxacin Swelling 02/26/2015  ? Povidone iodine  02/26/2015  ? Sulfa antibiotics  01/28/2018  ? Benadryl [diphenhydramine] Rash 08/10/2016  ? ? ?Review of Systems:    ?All systems reviewed and negative except where  noted in HPI. ? ? Physical Exam:  ?Vital signs in last 24 hours: ?Temp:  [97.7 ?F (36.5 ?C)-98.7 ?F (37.1 ?C)] 98.3 ?F (36.8 ?C) (04/05 7412) ?Pulse Rate:  [63-91] 78 (04/05 0812) ?Resp:  [16-18] 17 (04/05 8786) ?BP: (99-172)/(48-95) 166/67 (04/05 7672) ?SpO2:  [95 %-100 %] 95 % (04/05 0812) ?Weight:  [  68 kg] 68 kg (04/04 1146) ?  ?General:   Pleasant, cooperative in NAD ?Head:  Normocephalic and atraumatic. ?Eyes:   No icterus.   Conjunctiva pink. PERRLA. ?Ears:  Normal auditory acuity. ?Neck:  Supple; no masses or thyroidomegaly ?Lungs: Respirations even and unlabored. Lungs clear to auscultation bilaterally.   No wheezes, crackles, or rhonchi.  ?Heart:  Regular rate and rhythm;  Without murmur, clicks, rubs or gallops ?Abdomen:  Soft, nondistended, nontender. Normal bowel sounds. No appreciable masses or hepatomegaly.  No rebound or guarding.  ?Neurologic:  Alert and oriented x3;  grossly normal neurologically. ?Skin:  Intact without significant lesions or rashes. ?Cervical Nodes:  No significant cervical adenopathy. ?Psych:  Alert and cooperative. Normal affect. ? ?LAB RESULTS: ?Recent Labs  ?  12/23/21 ?1532 12/24/21 ?1148 12/24/21 ?1832 12/25/21 ?0408  ?WBC 7.3 6.2  --  7.0  ?HGB 4.6* 4.4* 8.3* 9.0*  ?HCT 17.0* 17.1* 27.6* 28.7*  ?PLT 294 276  --  197  ? ?BMET ?Recent Labs  ?  12/23/21 ?1532 12/24/21 ?1148 12/25/21 ?0408  ?NA 142 139 139  ?K 4.9 4.6 4.2  ?CL 106 110 115*  ?CO2 20 20* 22  ?GLUCOSE 142* 154* 102*  ?BUN 23 24* 19  ?CREATININE 1.52* 1.56* 1.29*  ?CALCIUM 8.7 8.4* 8.1*  ? ?LFT ?Recent Labs  ?  12/24/21 ?1148  ?PROT 6.5  ?ALBUMIN 3.3*  ?AST 23  ?ALT 13  ?ALKPHOS 51  ?BILITOT 0.4  ? ?PT/INR ?No results for input(s): LABPROT, INR in the last 72 hours. ? ?STUDIES: ?No results found. ? ? ? Impression / Plan:  ? ?Roger Cook is a 88 y.o. y/o male presents emergency room with an incidental finding of hemoglobin of 4.9 g on routine labs.  Looking back at his lab work he has had anemia for more than a  few years.  Likely iron deficiency but I cannot see any old iron labs being checked.  Presently he has severe iron deficiency very likely due to a combination of chronic NSAID use as well as possible bleeding

## 2021-12-25 NOTE — TOC Progression Note (Signed)
Transition of Care (TOC) - Progression Note  ? ? ?Patient Details  ?Name: Roger Cook ?MRN: [id number] ?Date of Birth: [birth year] ? ?Transition of Care (TOC) CM/SW Contact  ?Conception Oms, RN ?Phone Number: ?12/25/2021, 8:52 AM ? ?Clinical Narrative:   Patient has a HX of dementia, He lives at home with family. TOC to monitor and assist with DC planning and needs ? ? ? ?  ?  ? ?Expected Discharge Plan and Services ?  ?  ?  ?  ?  ?                ?  ?  ?  ?  ?  ?  ?  ?  ?  ?  ? ? ?Social Determinants of Health (SDOH) Interventions ?  ? ?Readmission Risk Interventions ?   ? View : No data to display.  ?  ?  ?  ? ? ?

## 2021-12-26 ENCOUNTER — Encounter: Payer: Self-pay | Admitting: Internal Medicine

## 2021-12-26 ENCOUNTER — Encounter
Admission: EM | Disposition: A | Discharge status: Home / Self Care | Payer: Self-pay | Source: Home / Self Care | Attending: Internal Medicine

## 2021-12-26 ENCOUNTER — Inpatient Hospital Stay: Payer: Medicare Other | Admitting: Anesthesiology

## 2021-12-26 ENCOUNTER — Other Ambulatory Visit: Payer: Self-pay

## 2021-12-26 DIAGNOSIS — K5520 Angiodysplasia of colon without hemorrhage: Secondary | ICD-10-CM

## 2021-12-26 DIAGNOSIS — D500 Iron deficiency anemia secondary to blood loss (chronic): Secondary | ICD-10-CM

## 2021-12-26 DIAGNOSIS — D649 Anemia, unspecified: Secondary | ICD-10-CM | POA: Diagnosis not present

## 2021-12-26 HISTORY — PX: COLONOSCOPY WITH PROPOFOL: SHX5780

## 2021-12-26 HISTORY — PX: ESOPHAGOGASTRODUODENOSCOPY (EGD) WITH PROPOFOL: SHX5813

## 2021-12-26 LAB — HEMOGLOBIN: Hemoglobin: 9.7 g/dL — ABNORMAL LOW (ref 13.0–17.0)

## 2021-12-26 SURGERY — ESOPHAGOGASTRODUODENOSCOPY (EGD) WITH PROPOFOL
Anesthesia: General

## 2021-12-26 MED ORDER — PROPOFOL 500 MG/50ML IV EMUL
INTRAVENOUS | Status: DC | PRN
  Administered 2021-12-26: 150 ug/kg/min via INTRAVENOUS

## 2021-12-26 MED ORDER — EPHEDRINE SULFATE (PRESSORS) 50 MG/ML IJ SOLN
INTRAMUSCULAR | Status: DC | PRN
  Administered 2021-12-26: 10 mg via INTRAVENOUS
  Administered 2021-12-26: 5 mg via INTRAVENOUS

## 2021-12-26 MED ORDER — PROPOFOL 10 MG/ML IV BOLUS
INTRAVENOUS | Status: DC | PRN
  Administered 2021-12-26: 60 mg via INTRAVENOUS

## 2021-12-26 MED ORDER — LIDOCAINE HCL (CARDIAC) PF 100 MG/5ML IV SOSY
PREFILLED_SYRINGE | INTRAVENOUS | Status: DC | PRN
  Administered 2021-12-26: 100 mg via INTRAVENOUS

## 2021-12-26 MED ORDER — PHENYLEPHRINE 40 MCG/ML (10ML) SYRINGE FOR IV PUSH (FOR BLOOD PRESSURE SUPPORT)
PREFILLED_SYRINGE | INTRAVENOUS | Status: DC | PRN
  Administered 2021-12-26 (×2): 80 ug via INTRAVENOUS

## 2021-12-26 NOTE — Transfer of Care (Signed)
Immediate Anesthesia Transfer of Care Note ? ?Patient: Roger Cook ? ?Procedure(s) Performed: ESOPHAGOGASTRODUODENOSCOPY (EGD) WITH PROPOFOL ?COLONOSCOPY WITH PROPOFOL ? ?Patient Location: Endoscopy Unit ? ?Anesthesia Type:General ? ?Level of Consciousness: drowsy ? ?Airway & Oxygen Therapy: Patient Spontanous Breathing ? ?Post-op Assessment: Report given to RN and Post -op Vital signs reviewed and stable ? ?Post vital signs: Reviewed and stable ? ?Last Vitals:  ?Vitals Value Taken Time  ?BP 148/40 12/26/21 1254  ?Temp    ?Pulse 78 12/26/21 1254  ?Resp 19 12/26/21 1254  ?SpO2 98 % 12/26/21 1254  ?Vitals shown include unvalidated device data. ? ?Last Pain:  ?Vitals:  ? 12/26/21 1147  ?TempSrc: Temporal  ?PainSc: 0-No pain  ?   ? ?  ? ?Complications: No notable events documented. ?

## 2021-12-26 NOTE — Anesthesia Preprocedure Evaluation (Addendum)
Anesthesia Evaluation  ?Patient identified by MRN, date of birth, ID band ?Patient awake ? ?General Assessment Comment:A&Ox3 ? ?Reviewed: ?Allergy & Precautions, NPO status , Patient's Chart, lab work & pertinent test results ? ?History of Anesthesia Complications ?Negative for: history of anesthetic complications ? ?Airway ?Mallampati: III ? ?TM Distance: >3 FB ?Neck ROM: Full ? ? ? Dental ?no notable dental hx. ? ?  ?Pulmonary ?sleep apnea and Continuous Positive Airway Pressure Ventilation , neg COPD, former smoker,  ?  ?breath sounds clear to auscultation- rhonchi ?(-) wheezing ? ? ? ? ? Cardiovascular ?Exercise Tolerance: Poor ?hypertension, Pt. on medications ?+ CAD and + DOE  ?(-) Past MI, (-) Cardiac Stents and (-) CABG  ?Rhythm:Regular Rate:Normal ?- Systolic murmurs and - Diastolic murmurs ?NM stress test 11/03/19: ?Regional wall motion: ?reveals normal myocardial thickening and wall  ?motion.  ?The overall quality of the study is good. ?  ?Artifacts noted: no  ?Left ventricular cavity: normal.  ? ?  ?Neuro/Psych ?neg Seizures PSYCHIATRIC DISORDERS Dementia negative neurological ROS ?   ? GI/Hepatic ?Neg liver ROS, GERD  ,  ?Endo/Other  ?neg diabetesHypothyroidism  ? Renal/GU ?Renal disease: hx of nephrolithiasis.  ? ?  ?Musculoskeletal ?negative musculoskeletal ROS ?(+)  ? Abdominal ?Normal abdominal exam  (+) - obese,   ?Peds ? Hematology ? ?(+) Blood dyscrasia, anemia ,   ?Anesthesia Other Findings ?Pt had Severe microcytic anemia with SOB now s/p 3 units prbcs. ? ? ?Past Medical History: ?No date: Anemia ?No date: Aortic atherosclerosis ([place]) ?No date: Bell palsy ?No date: Bilateral renal cysts ?No date: Bladder tumor ?No date: CAD (coronary artery disease) ?No date: DOE (dyspnea on exertion) ?No date: GERD (gastroesophageal reflux disease) ?No date: History of angina ?No date: History of penile implant ?No date: History of prostate cancer ?No date: Hyperlipidemia ?No  date: Hypertension ?No date: Hypothyroidism ?No date: Lipoma ?02/2011: MRSA bacteremia ?No date: Nephrolithiasis ?No date: OSA on CPAP ?No date: Pneumonia ?    Comment:  as a teenager ? ? Reproductive/Obstetrics ? ?  ? ? ? ? ? ? ? ? ? ? ? ? ? ?  ?  ? ? ? ? ? ? ?Anesthesia Physical ? ?Anesthesia Plan ? ?ASA: 3 ? ?Anesthesia Plan: General  ? ?Post-op Pain Management: Minimal or no pain anticipated  ? ?Induction: Intravenous ? ?PONV Risk Score and Plan: 1 and Propofol infusion, TIVA and Treatment may vary due to age or medical condition ? ?Airway Management Planned: Natural Airway ? ?Additional Equipment:  ? ?Intra-op Plan:  ? ?Post-operative Plan:  ? ?Informed Consent: I have reviewed the patients History and Physical, chart, labs and discussed the procedure including the risks, benefits and alternatives for the proposed anesthesia with the patient or authorized representative who has indicated his/her understanding and acceptance.  ? ? ? ?Dental advisory given ? ?Plan Discussed with: CRNA and Anesthesiologist ? ?Anesthesia Plan Comments:   ? ? ? ? ? ?Anesthesia Quick Evaluation ? ?

## 2021-12-26 NOTE — Op Note (Signed)
[facility] ?Gastroenterology ?Patient Name: Roger Cook ?Procedure Date: 12/26/2021 11:19 AM ?MRN: [id number] ?Account #: [id number] ?Date of Birth: 1927/04/16 ?Admit Type: Inpatient ?Age: 88 ?Room: [facility] ENDO ROOM 3 ?Gender: Male ?Note Status: Finalized ?Instrument Name: Colonscope [id number] ?Procedure:             Colonoscopy ?Indications:           Iron deficiency anemia ?Providers:             Lucilla Lame MD, MD ?Referring MD:          Kirstie Peri. Caryn Section, MD (Referring MD) ?Medicines:             Propofol per Anesthesia ?Complications:         No immediate complications. ?Procedure:             Pre-Anesthesia Assessment: ?                       - Prior to the procedure, a History and Physical was  ?                       performed, and patient medications and allergies were  ?                       reviewed. The patient's tolerance of previous  ?                       anesthesia was also reviewed. The risks and benefits  ?                       of the procedure and the sedation options and risks  ?                       were discussed with the patient. All questions were  ?                       answered, and informed consent was obtained. Prior  ?                       Anticoagulants: The patient has taken no previous  ?                       anticoagulant or antiplatelet agents. ASA Grade  ?                       Assessment: II - A patient with mild systemic disease.  ?                       After reviewing the risks and benefits, the patient  ?                       was deemed in satisfactory condition to undergo the  ?                       procedure. ?                       After obtaining informed consent, the colonoscope was  ?  passed under direct vision. Throughout the procedure,  ?                       the patient's blood pressure, pulse, and oxygen  ?                       saturations were monitored continuously. The  ?                       Colonoscope was  introduced through the anus and  ?                       advanced to the the cecum, identified by appendiceal  ?                       orifice and ileocecal valve. The colonoscopy was  ?                       performed without difficulty. The patient tolerated  ?                       the procedure well. The quality of the bowel  ?                       preparation was good. ?Findings: ?     The perianal and digital rectal examinations were normal. ?     Multiple medium-sized patchy angiodysplastic lesions without bleeding  ?     were found in the cecum. Vaporization for tissue destruction using argon  ?     plasma at 2 liters/minute and 50 watts was successful. ?     Multiple small-mouthed diverticula were found in the sigmoid colon. ?Impression:            - Multiple non-bleeding colonic angiodysplastic  ?                       lesions. Treated with argon plasma coagulation (APC). ?                       - Diverticulosis in the sigmoid colon. ?                       - No specimens collected. ?Recommendation:        - Discharge patient to home. ?                       - Resume previous diet. ?                       - Continue present medications. ?                       - Follow Hb. If continues to drop then may need a  ?                       capsule endoscopy but the AVM's may have been the  ?                       cause of his anemia. ?Procedure Code(s):     --- Professional --- ?  [phone], Colonoscopy, flexible; with ablation of  ?                       tumor(s), polyp(s), or other lesion(s) (includes pre-  ?                       and post-dilation and guide wire passage, when  ?                       performed) ?Diagnosis Code(s):     --- Professional --- ?                       D50.9, Iron deficiency anemia, unspecified ?                       K55.20, Angiodysplasia of colon without hemorrhage ?CPT copyright 2019 American Medical Association. All rights reserved. ?The codes documented in  this report are preliminary and upon coder review may  ?be revised to meet current compliance requirements. ?Lucilla Lame MD, MD ?12/26/2021 12:54:30 PM ?This report has been signed electronically. ?Number of Addenda: 0 ?Note Initiated On: 12/26/2021 11:19 AM ?Scope Withdrawal Time: 0 hours 9 minutes 15 seconds  ?Total Procedure Duration: 0 hours 16 minutes 55 seconds  ?Estimated Blood Loss:  Estimated blood loss: none. ?     [facility] ?

## 2021-12-26 NOTE — Progress Notes (Signed)
[place] at [facility] ? ? ?PATIENT NAME: Roger Cook   ? ?MR#:  [id number] ? ?DATE OF BIRTH:  Sep 19, 1927 ? ?SUBJECTIVE:  ? ?patient came in after he was informed by PCP that his hemoglobin was low. He denied any symptoms of shortness of breath excessive sleepiness any dark colored stools or any issues with active bleeding.  ?Status post three units of blood transfusion. No family at bedside ? ?Underwent prep last nite ?VITALS:  ?Blood pressure (!) 148/40, pulse 79, temperature (!) 96.9 ?F (36.1 ?C), temperature source Temporal, resp. rate (!) 23, height '5\' 6"'$  (1.676 m), weight 68.2 kg, SpO2 98 %. ? ?PHYSICAL EXAMINATION:  ? ?GENERAL:  88 y.o.-year-old patient lying in the bed with no acute distress.  ?LUNGS: Normal breath sounds bilaterally, no wheezing, rales, rhonchi.  ?CARDIOVASCULAR: S1, S2 normal. No murmurs, rubs, or gallops.  ?ABDOMEN: Soft, nontender, nondistended. Bowel sounds present.  ?EXTREMITIES: No  edema b/l.    ?NEUROLOGIC: nonfocal  patient is alert and awake ?SKIN: No obvious rash, lesion, or ulcer.  ? ?LABORATORY PANEL:  ?CBC ?Recent Labs  ?Lab 12/25/21 ?0408 12/26/21 ?0615  ?WBC 7.0  --   ?HGB 9.0* 9.7*  ?HCT 28.7*  --   ?PLT 197  --   ? ? ? ?Chemistries  ?Recent Labs  ?Lab 12/24/21 ?1148 12/25/21 ?0408  ?NA 139 139  ?K 4.6 4.2  ?CL 110 115*  ?CO2 20* 22  ?GLUCOSE 154* 102*  ?BUN 24* 19  ?CREATININE 1.56* 1.29*  ?CALCIUM 8.4* 8.1*  ?AST 23  --   ?ALT 13  --   ?ALKPHOS 51  --   ?BILITOT 0.4  --   ? ? ?Assessment and Plan ? ? Roger Cook is a 88 y.o. male  with medical history significant of HTN, hypothyroidism, Dementia w/o behavioral disturbance, CAD  followed by Roger Cook, GERD, OSA on CPAP who presents to ED sent in by pcp for low hgb on routine labs of 4.6. ? ?Severe microcytic anemia-- no active bleeding ?-- minimal symptoms only decreased activity. Denies shortness of breath or chest pain.-- History of iron deficiency anemia hemoglobin around six in  November 2020 ?-- patient came in with hemoglobin of 4.6--- three unit blood transfusion-- 8.3-- 9.0 ?-- colonoscopy in 2014 showed Colonic angiectasias and polpys ?-- seen by [facility]  ?-- upper endoscopy - Small hiatal hernia. ?                       - Normal stomach. ?                       - Duodenitis. ?colonoscopy - Multiple non-bleeding colonic angiodysplastic  ?                       lesions. Treated with argon plasma coagulation    (APC).- Diverticulosis in the sigmoid colon ? ?Hypertension ?-- stable ? ?Hypothyroidism ?-- continue Synthroid ? ?Jerrye Bushy ?-- continue PPI ? ?CAD ?-- no active issues ? ?Procedures:EGD/colonoscopy ?Family communication :wife Roger Cook ?Consults :GI Roger Cook ?CODE STATUS: FULL ?DVT Prophylaxis :SCD ?Level of care: Med-Surg ?Status is: Inpatient ?Remains inpatient appropriate because: anticipate d/c later today or tomorrow  ? ?TOTAL TIME TAKING CARE OF THIS PATIENT: 25 minutes.  ?>50% time spent on counselling and coordination of care ? ?Note: This dictation was prepared with Dragon dictation along with smaller phrase technology. Any transcriptional errors that result  from this process are unintentional. ? ?Fritzi Mandes M.D  ? ? ?[facility]  ? ?CC: ?Primary care physician; Roger Sons, MD  ?

## 2021-12-26 NOTE — Op Note (Signed)
[facility] ?Gastroenterology ?Patient Name: Roger Cook ?Procedure Date: 12/26/2021 11:20 AM ?MRN: [id number] ?Account #: [id number] ?Date of Birth: [birth year] ?Admit Type: Inpatient ?Age: 88 ?Room: [facility] ENDO ROOM 3 ?Gender: Male ?Note Status: Finalized ?Instrument Name: Upper Endoscope [id number] ?Procedure:             Upper GI endoscopy ?Indications:           Iron deficiency anemia ?Providers:             Lucilla Lame MD, MD ?Referring MD:          Kirstie Peri. Caryn Section, MD (Referring MD) ?Medicines:             Propofol per Anesthesia ?Complications:         No immediate complications. ?Procedure:             Pre-Anesthesia Assessment: ?                       - Prior to the procedure, a History and Physical was  ?                       performed, and patient medications and allergies were  ?                       reviewed. The patient's tolerance of previous  ?                       anesthesia was also reviewed. The risks and benefits  ?                       of the procedure and the sedation options and risks  ?                       were discussed with the patient. All questions were  ?                       answered, and informed consent was obtained. Prior  ?                       Anticoagulants: The patient has taken no previous  ?                       anticoagulant or antiplatelet agents. ASA Grade  ?                       Assessment: II - A patient with mild systemic disease.  ?                       After reviewing the risks and benefits, the patient  ?                       was deemed in satisfactory condition to undergo the  ?                       procedure. ?                       After obtaining informed consent, the endoscope was  ?  passed under direct vision. Throughout the procedure,  ?                       the patient's blood pressure, pulse, and oxygen  ?                       saturations were monitored continuously. The Endoscope  ?                       was  introduced through the mouth, and advanced to the  ?                       second part of duodenum. The upper GI endoscopy was  ?                       accomplished without difficulty. The patient tolerated  ?                       the procedure well. ?Findings: ?     A small hiatal hernia was present. ?     The stomach was normal. ?     Scattered mild inflammation characterized by erythema was found in the  ?     first portion of the duodenum. ?Impression:            - Small hiatal hernia. ?                       - Normal stomach. ?                       - Duodenitis. ?                       - No specimens collected. ?Recommendation:        - Discharge patient to home. ?                       - Resume previous diet. ?                       - Continue present medications. ?                       - Await pathology results. ?                       - Perform a colonoscopy today. ?Procedure Code(s):     --- Professional --- ?                       [phone], Esophagogastroduodenoscopy, flexible,  ?                       transoral; diagnostic, including collection of  ?                       specimen(s) by brushing or washing, when performed  ?                       (separate procedure) ?Diagnosis Code(s):     --- Professional --- ?  D50.9, Iron deficiency anemia, unspecified ?                       K29.80, Duodenitis without bleeding ?CPT copyright 2019 American Medical Association. All rights reserved. ?The codes documented in this report are preliminary and upon coder review may  ?be revised to meet current compliance requirements. ?Lucilla Lame MD, MD ?12/26/2021 12:31:33 PM ?This report has been signed electronically. ?Number of Addenda: 0 ?Note Initiated On: 12/26/2021 11:20 AM ?Estimated Blood Loss:  Estimated blood loss: none. ?     [facility] ?

## 2021-12-26 NOTE — Anesthesia Procedure Notes (Signed)
Date/Time: 12/26/2021 12:24 PM ?Performed by: Lily Peer, Damany Eastman, CRNA ?Pre-anesthesia Checklist: Emergency Drugs available, Suction available, Patient being monitored, Timeout performed and Patient identified ?Patient Re-evaluated:Patient Re-evaluated prior to induction ?Oxygen Delivery Method: Simple face mask ?Induction Type: IV induction ? ? ? ? ?

## 2021-12-27 DIAGNOSIS — D649 Anemia, unspecified: Secondary | ICD-10-CM | POA: Diagnosis not present

## 2021-12-27 LAB — CELIAC DISEASE PANEL
Endomysial Ab, IgA: NEGATIVE
IgA: 254 mg/dL (ref 61–437)
Tissue Transglutaminase Ab, IgA: <2 U/mL (ref 0–3)

## 2021-12-27 NOTE — Plan of Care (Signed)

## 2021-12-27 NOTE — Discharge Summary (Signed)
?Physician Discharge Summary ?  ?Patient: Roger Cook MRN: [id number] DOB: 10/11/1926  ?Admit date:     12/24/2021  ?Discharge date: 12/27/21  ?Discharge Physician: Fritzi Mandes  ? ?PCP: [name], MD  ? ?Recommendations at discharge:  ? ?patient to follow-up with PCP Dr. Caryn Section in two weeks ?follow-up G.I. Dr. Allen Norris as needed ? ?Discharge Diagnoses: ?Microcytic Anemia due to Slow GI bleed from Colonic AVM ? ?Hospital Course: ? ? Roger Cook is a 88 y.o. male  with medical history significant of HTN, hypothyroidism, Dementia w/o behavioral disturbance, CAD  followed by dr Clayborn Bigness, GERD, OSA on CPAP who presents to ED sent in by pcp for low hgb on routine labs of 4.6. ?  ?Severe microcytic anemia-- no active bleeding ?-- minimal symptoms only decreased activity. Denies shortness of breath or chest pain. ?-- History of iron deficiency anemia hemoglobin around 6.0 in November 2020 ?-- patient came in with hemoglobin of 4.6--- three unit blood transfusion-- 8.3-- 9.0--9.7 ?-- colonoscopy in 2014 showed Colonic angiectasias and polpys ?-- seen by G.I[facility]  ?-- upper endoscopy - Small hiatal hernia. ?                       - Normal stomach. ?                       - Duodenitis. ?colonoscopy - Multiple non-bleeding colonic angiodysplastic  ?                       lesions. Treated with argon plasma coagulation    (APC).- Diverticulosis in the sigmoid colon ?-- hemodynamically stable. No other symptoms ?  ?Hypertension ?-- stable ?  ?Hypothyroidism ?-- continue Synthroid ?  ?Jerrye Bushy ?-- continue PPI ?  ?CAD ?-- no active issues ? ?okay to discharge from [facility].I[facility] standpoint. ?  ?Procedures:EGD/colonoscopy ?Family communication :wife Pamala Hurry and daughter in the room ?Consults :GI Dr Vicente Males ?CODE STATUS: FULL ?DVT Prophylaxis :SCD ? ?  ? ? ?Disposition: Home ?Diet recommendation:  ?Discharge Diet Orders (From admission, onward)  ? ?  Start     Ordered  ? 12/27/21 0000  Diet - low sodium heart healthy       ? 12/27/21 1030  ? ?  ?   ? ?  ? ?Cardiac diet ?DISCHARGE MEDICATION: ?Allergies as of 12/27/2021   ? ?   Reactions  ? Levofloxacin Swelling  ? lips swelling  ? Povidone Iodine   ? Unknown, can not remember  ? Sulfa Antibiotics   ? Unknown, can not remember  ? Benadryl [diphenhydramine] Rash  ? ?  ? ?  ?Medication List  ?  ? ?TAKE these medications   ? ?ARTIFICIAL TEAR SOLUTION OP ?Place 1 drop into both eyes 2 (two) times daily as needed (dry eyes). ?  ?aspirin 81 MG tablet ?Take 81 mg by mouth daily. ?  ?brimonidine 0.2 % ophthalmic solution ?Commonly known as: ALPHAGAN ?Place 1 drop into the left eye 2 (two) times daily. ?  ?CALCIUM 1200 PO ?Take by mouth. ?  ?donepezil 10 MG tablet ?Commonly known as: ARICEPT ?TAKE 1 TABLET BY MOUTH AT BEDTIME ?  ?fluticasone 50 MCG/ACT nasal spray ?Commonly known as: FLONASE ?Place 2 sprays into both nostrils daily as needed for allergies. ?  ?isosorbide mononitrate 60 MG 24 hr tablet ?Commonly known as: IMDUR ?Take 1 tablet (60 mg total) by mouth daily. ?  ?levothyroxine 88 MCG tablet ?  Commonly known as: SYNTHROID ?Take 1 tablet (88 mcg total) by mouth daily before breakfast. ?  ?memantine 10 MG tablet ?Commonly known as: NAMENDA ?TAKE 1 TABLET BY MOUTH TWO TIMES DAILY ?  ?metoprolol succinate 25 MG 24 hr tablet ?Commonly known as: TOPROL-XL ?Take 12.5 mg by mouth daily. ?  ?MULTIPLE VITAMIN PO ?Take 1 tablet by mouth daily. ?  ?NIFEdipine 30 MG 24 hr tablet ?Commonly known as: PROCARDIA-XL/NIFEDICAL-XL ?Take 30 mg by mouth daily. ?  ?NON FORMULARY ?CPAP (Free Text) - Historical Medication  As directed  Started 22-Sep-1994 Active ?  ?pravastatin 40 MG tablet ?Commonly known as: PRAVACHOL ?TAKE 1 TABLET BY MOUTH DAILY ?  ?Prevagen 10 MG Caps ?Generic drug: Apoaequorin ?Take by mouth. ?  ?PreviDent 5000 Dry Mouth 1.1 % Gel dental gel ?Generic drug: sodium fluoride ?Place 1 application onto teeth 2 (two) times daily. ?  ? ?  ? ? Follow-up Information   ? ? [name], MD. Call in 1 week(s).    ?Specialty: Family Medicine ?Why: hospital f/u ?Contact information: ?[address] ?[address] ?[place] [address] ?[phone] ? ? ?  ?  ? ?  ?  ? ?  ? ?Discharge Exam: ?Filed Weights  ? 12/24/21 1146 12/26/21 0500 12/27/21 0500  ?Weight: 68 kg 68.2 kg 68.8 kg  ? ? ? ?Condition at discharge:  ? ?The results of significant diagnostics from this hospitalization (including imaging, microbiology, ancillary and laboratory) are listed below for reference.  ? ?Imaging Studies: ?No results found. ? ?Microbiology: ?Results for orders placed or performed during the hospital encounter of 03/06/21  ?Urine Culture     Status: None  ? Collection Time: 03/06/21  2:15 PM  ? Specimen: Urine, Clean Catch  ?Result Value Ref Range Status  ? Specimen Description   Final  ?  URINE, CLEAN CATCH ?Performed at [facility], [address], [place], [place] [address] ?  ? Special Requests   Final  ?  NONE ?Performed at [facility], [address], [place], [place] [address] ?  ? Culture   Final  ?  NO GROWTH ?Performed at [facility], [place] [address], [place], [place] [address] ?  ? Report Status 03/08/2021 FINAL  Final  ? ? ?Labs: ?CBC: ?Recent Labs  ?Lab 12/23/21 ?1532 12/24/21 ?1148 12/24/21 ?1832 12/25/21 ?0408 12/26/21 ?0615  ?WBC 7.3 6.2  --  7.0  --   ?HGB 4.6* 4.4* 8.3* 9.0* 9.7*  ?HCT 17.0* 17.1* 27.6* 28.7*  --   ?MCV 70* 71.3*  --  75.7*  --   ?PLT 294 276  --  197  --   ? ?Basic Metabolic Panel: ?Recent Labs  ?Lab 12/23/21 ?1532 12/24/21 ?1148 12/25/21 ?0408  ?NA 142 139 139  ?K 4.9 4.6 4.2  ?CL 106 110 115*  ?CO2 20 20* 22  ?GLUCOSE 142* 154* 102*  ?BUN 23 24* 19  ?CREATININE 1.52* 1.56* 1.29*  ?CALCIUM 8.7 8.4* 8.1*  ? ?Liver Function Tests: ?Recent Labs  ?Lab 12/23/21 ?1532 12/24/21 ?1148  ?AST 15 23  ?ALT 12 13  ?ALKPHOS 65 51  ?BILITOT <0.2 0.4  ?PROT 6.0 6.5  ?ALBUMIN 3.9 3.3*  ? ?Discharge time spent: greater than 30 minutes. ? ?Signed: ?Fritzi Mandes, MD ?[facility] ?12/27/2021 ?

## 2021-12-27 NOTE — Progress Notes (Signed)
Discharge instructions discussed to patient and relatives, IV Removed, External catheter removed. Belongings returned to patient.  ?

## 2021-12-28 NOTE — Anesthesia Postprocedure Evaluation (Signed)
Anesthesia Post Note ? ?Patient: Roger Cook ? ?Procedure(s) Performed: ESOPHAGOGASTRODUODENOSCOPY (EGD) WITH PROPOFOL ?COLONOSCOPY WITH PROPOFOL ? ?Patient location during evaluation: Endoscopy ?Anesthesia Type: General ?Level of consciousness: awake and alert ?Pain management: pain level controlled ?Vital Signs Assessment: post-procedure vital signs reviewed and stable ?Respiratory status: spontaneous breathing, nonlabored ventilation and respiratory function stable ?Cardiovascular status: blood pressure returned to baseline and stable ?Postop Assessment: no apparent nausea or vomiting ?Anesthetic complications: no ? ? ?No notable events documented. ? ? ?Last Vitals:  ?Vitals:  ? 12/27/21 0539 12/27/21 0843  ?BP: (!) 179/60 (!) 161/59  ?Pulse: 87 82  ?Resp: 16 16  ?Temp: 36.9 ?C 36.7 ?C  ?SpO2: 97% 95%  ?  ?Last Pain:  ?Vitals:  ? 12/27/21 1000  ?TempSrc:   ?PainSc: 0-No pain  ? ? ?  ?  ?  ?  ?  ?  ? ?[place] Ouch ? ? ? ? ?

## 2021-12-30 ENCOUNTER — Telehealth: Payer: Self-pay

## 2021-12-30 ENCOUNTER — Encounter: Payer: Self-pay | Admitting: Gastroenterology

## 2021-12-30 NOTE — Telephone Encounter (Signed)
Transition Care Management Follow-up Telephone Call ?Date of discharge and from where:TCM [facility] 12-27-21 Dx: microcytic anemia  ?How have you been since you were released from the hospital? Doing fine  ?Any questions or concerns? No ? ?Items Reviewed: ?Did the pt receive and understand the discharge instructions provided? Yes  ?Medications obtained and verified? Yes  ?Other? No  ?Any new allergies since your discharge? No  ?Dietary orders reviewed? Yes ?Do you have support at home? Yes  ? ?Home Care and Equipment/Supplies: ?Were home health services ordered? no ?If so, what is the name of the agency? na  ?Has the agency set up a time to come to the patient's home? not applicable ?Were any new equipment or medical supplies ordered?  No ?What is the name of the medical supply agency? na ?Were you able to get the supplies/equipment? not applicable ?Do you have any questions related to the use of the equipment or supplies? No ? ?Functional Questionnaire: (I = Independent and D = Dependent) ?ADLs: I ? ?Bathing/Dressing- I ? ?Meal Prep- I ? ?Eating- I ? ?Maintaining continence- I ? ?Transferring/Ambulation- I ? ?Managing Meds- I ? ?Follow up appointments reviewed: ? ?PCP Hospital f/u appt confirmed? Yes  Scheduled to see Dr Caryn Section on 01-07-22 @ 1120am. ?[facility] f/u appt confirmed? No  . ?Are transportation arrangements needed? No  ?If their condition worsens, is the pt aware to call PCP or go to the Emergency Dept.? Yes ?Was the patient provided with contact information for the PCP's office or ED? Yes ?Was to pt encouraged to call back with questions or concerns? Yes  ?

## 2022-01-07 ENCOUNTER — Ambulatory Visit (INDEPENDENT_AMBULATORY_CARE_PROVIDER_SITE_OTHER): Payer: Medicare Other | Admitting: Family Medicine

## 2022-01-07 ENCOUNTER — Encounter: Payer: Self-pay | Admitting: Family Medicine

## 2022-01-07 VITALS — BP 132/52 | HR 70 | Temp 98.6°F | Resp 12 | Wt 149.6 lb

## 2022-01-07 DIAGNOSIS — R152 Fecal urgency: Secondary | ICD-10-CM

## 2022-01-07 DIAGNOSIS — D500 Iron deficiency anemia secondary to blood loss (chronic): Secondary | ICD-10-CM

## 2022-01-07 DIAGNOSIS — R159 Full incontinence of feces: Secondary | ICD-10-CM

## 2022-01-07 DIAGNOSIS — I25119 Atherosclerotic heart disease of native coronary artery with unspecified angina pectoris: Secondary | ICD-10-CM

## 2022-01-07 NOTE — Progress Notes (Signed)
?  ?I,Jana Robinson,acting as a scribe for Lelon Huh, MD.,have documented all relevant documentation on the behalf of Lelon Huh, MD,as directed by  Lelon Huh, MD while in the presence of Lelon Huh, MD. ? ? ?Established patient visit ? ? ?Patient: Roger Cook   DOB: 11-17-1926   88 y.o. Male  MRN: [id number] ?Visit Date: 01/07/2022 ? ?Today's healthcare provider: Lelon Huh, MD  ? ?Chief Complaint  ?Patient presents with  ? Follow-up  ?  ED   ? ?Subjective  ?  ?HPI ?HPI   ? ? Follow-up   ? Additional comments: ED  ? ?  ?  ?Last edited by Alanson Puls, CMA on 01/07/2022 11:41 AM.  ?  ?  ?Follow up Hospitalization ? ?Patient was admitted to [facility] on 12-24-21 and discharged on 12-27-21. ?He was treated for Microcytic Anemia due to colonic AVMs which were coagulated.  ?Treatment for this included: view chart. ?Telephone follow up was done on 4 10 23. ?He reports good compliance with treatment. ?He reports this condition is improved. ? ?-----------------------------------------------------------------------------------------  ?His wife also reports that he has had trouble with fecal incontinence, having a few loose stools most days. No blood in stool, no abdominal pains.  ? ? ? ?Medications: ?Outpatient Medications Prior to Visit  ?Medication Sig  ? Apoaequorin (PREVAGEN) 10 MG CAPS Take by mouth.  ? ARTIFICIAL TEAR SOLUTION OP Place 1 drop into both eyes 2 (two) times daily as needed (dry eyes).  ? aspirin 81 MG tablet Take 81 mg by mouth daily.   ? brimonidine (ALPHAGAN) 0.2 % ophthalmic solution Place 1 drop into the left eye 2 (two) times daily.  ? Calcium Carbonate-Vit D-Min (CALCIUM 1200 PO) Take by mouth.  ? donepezil (ARICEPT) 10 MG tablet TAKE 1 TABLET BY MOUTH AT BEDTIME  ? fluticasone (FLONASE) 50 MCG/ACT nasal spray Place 2 sprays into both nostrils daily as needed for allergies.  ? isosorbide mononitrate (IMDUR) 60 MG 24 hr tablet Take 1 tablet (60 mg total) by mouth daily.  ?  levothyroxine (SYNTHROID) 88 MCG tablet Take 1 tablet (88 mcg total) by mouth daily before breakfast.  ? memantine (NAMENDA) 10 MG tablet TAKE 1 TABLET BY MOUTH TWO TIMES DAILY  ? metoprolol succinate (TOPROL-XL) 25 MG 24 hr tablet Take 12.5 mg by mouth daily.  ? MULTIPLE VITAMIN PO Take 1 tablet by mouth daily.  ? NIFEdipine (PROCARDIA-XL/NIFEDICAL-XL) 30 MG 24 hr tablet Take 30 mg by mouth daily.  ? NON FORMULARY CPAP (Free Text) - Historical Medication  As directed  Started 22-Sep-1994 Active  ? pravastatin (PRAVACHOL) 40 MG tablet TAKE 1 TABLET BY MOUTH DAILY  ? PREVIDENT 5000 DRY MOUTH 1.1 % GEL dental gel Place 1 application onto teeth 2 (two) times daily.  ? ?No facility-administered medications prior to visit.  ? ? ? ? ?  Objective  ?  ?BP (!) 132/52 (BP Location: Right Arm, Patient Position: Sitting, Cuff Size: Normal)   Pulse 70   Temp 98.6 ?F (37 ?C) (Oral)   Resp 12   Wt 149 lb 9.6 oz (67.9 kg)   SpO2 100%   BMI 24.15 kg/m?  ? ? ?Physical Exam  ? ? ?General: Appearance:    Well developed, well nourished male in no acute distress  ?Eyes:    PERRL, conjunctiva/corneas clear, EOM's intact       ?Lungs:     Clear to auscultation bilaterally, respirations unlabored  ?Heart:    Normal heart rate. Normal rhythm.  No murmurs, rubs, or gallops.    ?MS:   All extremities are intact.    ?Neurologic:   Awake, alert, oriented x 3. No apparent focal neurological defect.   ?   ?  ? Assessment & Plan  ?  ? ?1. Iron deficiency anemia due to chronic blood loss ?Doing well post transfusion and coagulation of colonic AVMs.  ?- CBC ?- Ferritin ? ?2. Incontinence of feces with fecal urgency ?Partially due to loose stools, Try OTC fiber to create a more formed stool. Consider adding daily Imodium or Lomotil ?   ? ?The entirety of the information documented in the History of Present Illness, Review of Systems and Physical Exam were personally obtained by me. Portions of this information were initially documented by the CMA  and reviewed by me for thoroughness and accuracy.   ? ? ?Lelon Huh, MD  ?[facility] ?[phone] (phone) ?[phone] (fax) ? ?[facility] ?

## 2022-01-07 NOTE — Patient Instructions (Signed)
Please review the attached list of medications and notify my office if there are any errors.   Please go to the lab draw station in [address] on the second floor of [facility]. Normal hours are 8:00am to 11:30am and 1:00pm to 4:00pm Monday through Friday  

## 2022-01-09 DIAGNOSIS — D500 Iron deficiency anemia secondary to blood loss (chronic): Secondary | ICD-10-CM | POA: Diagnosis not present

## 2022-01-10 LAB — FERRITIN: Ferritin: 36 ng/mL (ref 30–400)

## 2022-01-10 LAB — CBC
Hematocrit: 34.4 % — ABNORMAL LOW (ref 37.5–51.0)
Hemoglobin: 10.2 g/dL — ABNORMAL LOW (ref 13.0–17.7)
MCH: 24.1 pg — ABNORMAL LOW (ref 26.6–33.0)
MCHC: 29.7 g/dL — ABNORMAL LOW (ref 31.5–35.7)
MCV: 81 fL (ref 79–97)
Platelets: 388 10*3/uL (ref 150–450)
RBC: 4.24 x10E6/uL (ref 4.14–5.80)
RDW: 23.8 % — ABNORMAL HIGH (ref 11.6–15.4)
WBC: 5.7 10*3/uL (ref 3.4–10.8)

## 2022-01-13 ENCOUNTER — Telehealth: Payer: Self-pay

## 2022-01-13 NOTE — Telephone Encounter (Signed)
Reason for CRM: Patient daughter Kae Heller called in to inquire of Dr Caryn Section about what it was that he told the patient at his visit on 01/07/22. Per [name] patient or wife does not remember what medication he can use to help offset the loose stools. Please call [name] at Ph# [phone] ?

## 2022-01-14 NOTE — Telephone Encounter (Signed)
Left detailed message advising as Try OTC fiber to create a more formed stool. Consider adding daily Imodium or Lomotil as per last ov note.  ?

## 2022-03-10 ENCOUNTER — Emergency Department: Payer: Medicare Other

## 2022-03-10 ENCOUNTER — Other Ambulatory Visit: Payer: Self-pay

## 2022-03-10 ENCOUNTER — Emergency Department
Admission: EM | Admit: 2022-03-10 | Discharge: 2022-03-10 | Disposition: A | Discharge status: Home / Self Care | Payer: Medicare Other | Attending: Emergency Medicine | Admitting: Emergency Medicine

## 2022-03-10 DIAGNOSIS — G319 Degenerative disease of nervous system, unspecified: Secondary | ICD-10-CM | POA: Diagnosis not present

## 2022-03-10 DIAGNOSIS — E039 Hypothyroidism, unspecified: Secondary | ICD-10-CM | POA: Insufficient documentation

## 2022-03-10 DIAGNOSIS — Z8546 Personal history of malignant neoplasm of prostate: Secondary | ICD-10-CM | POA: Diagnosis not present

## 2022-03-10 DIAGNOSIS — I6782 Cerebral ischemia: Secondary | ICD-10-CM | POA: Diagnosis not present

## 2022-03-10 DIAGNOSIS — R001 Bradycardia, unspecified: Secondary | ICD-10-CM | POA: Diagnosis not present

## 2022-03-10 DIAGNOSIS — I10 Essential (primary) hypertension: Secondary | ICD-10-CM | POA: Insufficient documentation

## 2022-03-10 DIAGNOSIS — R531 Weakness: Secondary | ICD-10-CM | POA: Diagnosis not present

## 2022-03-10 DIAGNOSIS — R4182 Altered mental status, unspecified: Secondary | ICD-10-CM | POA: Insufficient documentation

## 2022-03-10 DIAGNOSIS — I700 Atherosclerosis of aorta: Secondary | ICD-10-CM | POA: Diagnosis not present

## 2022-03-10 LAB — CBC WITH DIFFERENTIAL/PLATELET
Abs Immature Granulocytes: 0.01 10*3/uL (ref 0.00–0.07)
Basophils Absolute: 0 10*3/uL (ref 0.0–0.1)
Basophils Relative: 1 %
Eosinophils Absolute: 0.1 10*3/uL (ref 0.0–0.5)
Eosinophils Relative: 2 %
HCT: 33 % — ABNORMAL LOW (ref 39.0–52.0)
Hemoglobin: 9.8 g/dL — ABNORMAL LOW (ref 13.0–17.0)
Immature Granulocytes: 0 %
Lymphocytes Relative: 28 %
Lymphs Abs: 1.6 10*3/uL (ref 0.7–4.0)
MCH: 26.3 pg (ref 26.0–34.0)
MCHC: 29.7 g/dL — ABNORMAL LOW (ref 30.0–36.0)
MCV: 88.7 fL (ref 80.0–100.0)
Monocytes Absolute: 0.5 10*3/uL (ref 0.1–1.0)
Monocytes Relative: 9 %
Neutro Abs: 3.3 10*3/uL (ref 1.7–7.7)
Neutrophils Relative %: 60 %
Platelets: 231 10*3/uL (ref 150–400)
RBC: 3.72 MIL/uL — ABNORMAL LOW (ref 4.22–5.81)
RDW: 19.9 % — ABNORMAL HIGH (ref 11.5–15.5)
WBC: 5.6 10*3/uL (ref 4.0–10.5)
nRBC: 0 % (ref 0.0–0.2)

## 2022-03-10 LAB — COMPREHENSIVE METABOLIC PANEL
ALT: 18 U/L (ref 0–44)
AST: 23 U/L (ref 15–41)
Albumin: 3.8 g/dL (ref 3.5–5.0)
Alkaline Phosphatase: 55 U/L (ref 38–126)
Anion gap: 6 (ref 5–15)
BUN: 24 mg/dL — ABNORMAL HIGH (ref 8–23)
CO2: 25 mmol/L (ref 22–32)
Calcium: 9.1 mg/dL (ref 8.9–10.3)
Chloride: 108 mmol/L (ref 98–111)
Creatinine, Ser: 1.19 mg/dL (ref 0.61–1.24)
GFR, Estimated: 57 mL/min — ABNORMAL LOW (ref >60)
Glucose, Bld: 126 mg/dL — ABNORMAL HIGH (ref 70–99)
Potassium: 4.5 mmol/L (ref 3.5–5.1)
Sodium: 139 mmol/L (ref 135–145)
Total Bilirubin: 0.6 mg/dL (ref 0.3–1.2)
Total Protein: 7.3 g/dL (ref 6.5–8.1)

## 2022-03-10 LAB — URINALYSIS, ROUTINE W REFLEX MICROSCOPIC
Bilirubin Urine: NEGATIVE
Glucose, UA: NEGATIVE mg/dL
Hgb urine dipstick: NEGATIVE
Ketones, ur: NEGATIVE mg/dL
Leukocytes,Ua: NEGATIVE
Nitrite: NEGATIVE
Protein, ur: 30 mg/dL — AB
Specific Gravity, Urine: 1.012 (ref 1.005–1.030)
Squamous Epithelial / HPF: NONE SEEN (ref 0–5)
pH: 7 (ref 5.0–8.0)

## 2022-03-10 NOTE — ED Provider Triage Note (Signed)
Emergency Medicine Provider Triage Evaluation Note  Roger Cook , a 88 y.o. male  was evaluated in triage.  Wife complains of altered mental status.  Review of Systems  Positive: confusion Negative: Denies chest pain or shortness of breath, N, V or D.  Physical Exam  BP (!) 187/50 (BP Location: Right Arm)   Pulse (!) 46   Temp 97.8 F (36.6 C) (Oral)   Resp 20   Ht '5\' 6"'$  (1.676 m)   Wt 68 kg   SpO2 96%   BMI 24.21 kg/m  Gen:   Awake, no distress  Talkative. Resp:  Normal effort  MSK:   Moves extremities without difficulty  Other:    Medical Decision Making  Medically screening exam initiated at 12:54 PM.  Appropriate orders placed.  [name] was informed that the remainder of the evaluation will be completed by another provider, this initial triage assessment does not replace that evaluation, and the importance of remaining in the ED until their evaluation is complete.     Johnn Hai, PA-C 03/10/22 1303

## 2022-03-10 NOTE — ED Provider Notes (Signed)
[facility] Provider Note    Event Date/Time   First MD Initiated Contact with Patient 03/10/22 1648     (approximate)   History   Altered Mental Status   HPI  Roger Cook is a 88 y.o. male with history of Bell's palsy,, hypertension, prostate cancer, hypothyroidism and recent blood transfusion 2 months ago presents emergency department with altered mental status.  His wife states that he went to [facility] on Saturday and was leaning against the car, someone had to help him into the car and he drove home.  She had to help him out of the car.  States he acted a little confused.  States his color has been altered.  States she is more pale similar to when he had to have a blood transfusion.  States she is concerned he might of had a stroke on Saturday.  Patient is complaining of weakness.      Physical Exam   Triage Vital Signs: ED Triage Vitals  Enc Vitals Group     BP 03/10/22 1248 (!) 187/50     Pulse Rate 03/10/22 1248 (!) 46     Resp 03/10/22 1248 20     Temp 03/10/22 1248 97.8 F (36.6 C)     Temp Source 03/10/22 1248 Oral     SpO2 03/10/22 1248 96 %     Weight 03/10/22 1249 150 lb (68 kg)     Height 03/10/22 1249 '5\' 6"'$  (1.676 m)     Head Circumference --      Peak Flow --      Pain Score 03/10/22 1248 0     Pain Loc --      Pain Edu? --      Excl. in [place]? --     Most recent vital signs: Vitals:   03/10/22 1830 03/10/22 1900  BP: (!) 189/54 (!) 188/49  Pulse: (!) 53 (!) 52  Resp: 17 20  Temp:    SpO2: 100% 98%     General: Awake, no distress.   CV:  Good peripheral perfusion. regular rate and  rhythm Resp:  Normal effort. Lungs CTA Abd:  No distention.   Other:  Patient does appear to be pale, does have left-sided facial palsy typical of Bell's palsy   ED Results / Procedures / Treatments   Labs (all labs ordered are listed, but only abnormal results are displayed) Labs Reviewed  CBC WITH DIFFERENTIAL/PLATELET - Abnormal;  Notable for the following components:      Result Value   RBC 3.72 (*)    Hemoglobin 9.8 (*)    HCT 33.0 (*)    MCHC 29.7 (*)    RDW 19.9 (*)    All other components within normal limits  COMPREHENSIVE METABOLIC PANEL - Abnormal; Notable for the following components:   Glucose, Bld 126 (*)    BUN 24 (*)    GFR, Estimated 57 (*)    All other components within normal limits  URINALYSIS, ROUTINE W REFLEX MICROSCOPIC - Abnormal; Notable for the following components:   Color, Urine STRAW (*)    APPearance CLEAR (*)    Protein, ur 30 (*)    Bacteria, UA RARE (*)    All other components within normal limits     EKG  EKG shows bradycardia   RADIOLOGY CT of the head, chest x-ray    PROCEDURES:   Procedures   MEDICATIONS ORDERED IN ED: Medications - No data to display   IMPRESSION /  MDM / ASSESSMENT AND PLAN / ED COURSE  I reviewed the triage vital signs and the nursing notes.                              Differential diagnosis includes, but is not limited to, TIA, CVA, AMS, dementia, hypoxia, anemia  Patient's presentation is most consistent with acute presentation with potential threat to life or bodily function.   Due to patient's altered mental status order CT of the head, chest x-ray ordered, labs ordered  Labs are reassuring, CBC and metabolic panel and urinalysis appear to be in the patient's normal trend.  Chest x-ray interpreted by me as being negative  CT of the head interpreted by me as being negative for any acute abnormality  Shared decision making with the family we did discuss an MRI.  Patient and his family do not want to do MRI at this time.  He states he feels fine and would like to go home.  His wife states he is back to baseline.  Other family members in the room agree with treatment plan.  He is to follow-up with his regular doctor.  Return emergency department worsening.  Discharged in stable condition.  Do not feel that he needs admission as all  test are normal      FINAL CLINICAL IMPRESSION(S) / ED DIAGNOSES   Final diagnoses:  Altered mental status, unspecified altered mental status type     Rx / DC Orders   ED Discharge Orders     None        Note:  This document was prepared using Dragon voice recognition software and may include unintentional dictation errors.    Versie Starks, PA-C 03/10/22 Clarene Critchley, MD 03/10/22 2250

## 2022-03-10 NOTE — Discharge Instructions (Signed)
Follow-up with your regular doctor if not improving 3 days.  Return emergency department worsening. 

## 2022-03-10 NOTE — ED Triage Notes (Signed)
Pt presents to ED with c/o having possible AMS. Wife states pt was at [facility] the other day where he had a episode where he became more confused, pt did not get evaluated at that time.   Pt does have HX of dementia. Wife states she is concerned for possible TIA or needing a blood transfusion. Wife states recently admitted and had to have blood transfusion, wife states pt looks pale. Pt is orientated to person and place, wife states this is his baseline. Pt denies injury or trauma.

## 2022-03-11 ENCOUNTER — Ambulatory Visit: Payer: Self-pay | Admitting: *Deleted

## 2022-03-11 NOTE — Telephone Encounter (Signed)
Summary: discuss medication   Patient's daughter requesting a cb to discuss patient's current medication list. I could not locate daughter on dpr       Called patient's daughter, Roger Cook listed on DPR regarding medications for patient. Reviewed current medication list and noted procardia XL 30 mg on patient current list of medications and daughter reports it was not listed on AVS from recent visit to ED. Would like to confirm is patient to continue to take. Metoprolol ( toprol XL) 25 mg on current list to take 12.5 mg daily and would like to confirm that is the dose for patient to take. Recommended daughter to call specialist that ordered medications listed as historical to confirm if patient is to continue to take. Daughter reports patient seen in ED over weekend for weakness , trouble walking and slurred speech. Daughter not with patient now. Appt scheduled to review sx and medication with PCP on 03/14/22. Please advise regarding medications. Recommended if sx worsen take patient back to ED.     Reason for Disposition  [1] Caller has NON-URGENT medicine question about med that PCP prescribed AND [2] triager unable to answer question    Some medication by historical provider  Answer Assessment - Initial Assessment Questions 1. NAME of MEDICATION: "What medicine are you calling about?"     Patient's daughter, on DPR wanted to review medications patient is currently taking vs AVS given from recent hospital visit in ED. 2. QUESTION: "What is your question?" (e.g., double dose of medicine, side effect)     Procardia XL 30 mg not on AVS is patient to continue to take. Metoprolol (toprol XL) 25 mg ordered to take 12.5 mg daily and if that is correct. 3. PRESCRIBING HCP: "Who prescribed it?" Reason: if prescribed by specialist, call should be referred to that group.     Historical medications 4. SYMPTOMS: "Do you have any symptoms?"     Na daughter not with patient at this time  5. SEVERITY: If  symptoms are present, ask "Are they mild, moderate or severe?"     na 6. PREGNANCY:  "Is there any chance that you are pregnant?" "When was your last menstrual period?"     na  Protocols used: Medication Question Call-A-AH

## 2022-03-12 NOTE — Telephone Encounter (Signed)
FYI

## 2022-03-14 ENCOUNTER — Ambulatory Visit: Payer: Medicare Other | Admitting: Family Medicine

## 2022-03-26 ENCOUNTER — Ambulatory Visit (INDEPENDENT_AMBULATORY_CARE_PROVIDER_SITE_OTHER): Payer: Medicare Other

## 2022-03-26 ENCOUNTER — Ambulatory Visit (INDEPENDENT_AMBULATORY_CARE_PROVIDER_SITE_OTHER): Payer: Medicare Other | Admitting: Family Medicine

## 2022-03-26 ENCOUNTER — Encounter: Payer: Self-pay | Admitting: Family Medicine

## 2022-03-26 VITALS — BP 145/44 | HR 47 | Temp 97.5°F | Resp 16 | Wt 150.0 lb

## 2022-03-26 DIAGNOSIS — D500 Iron deficiency anemia secondary to blood loss (chronic): Secondary | ICD-10-CM

## 2022-03-26 DIAGNOSIS — I25119 Atherosclerotic heart disease of native coronary artery with unspecified angina pectoris: Secondary | ICD-10-CM | POA: Diagnosis not present

## 2022-03-26 DIAGNOSIS — G459 Transient cerebral ischemic attack, unspecified: Secondary | ICD-10-CM | POA: Diagnosis not present

## 2022-03-26 DIAGNOSIS — R55 Syncope and collapse: Secondary | ICD-10-CM | POA: Diagnosis not present

## 2022-03-26 DIAGNOSIS — R001 Bradycardia, unspecified: Secondary | ICD-10-CM | POA: Diagnosis not present

## 2022-03-26 NOTE — Progress Notes (Signed)
I,[name],acting as a scribe for Lelon Huh, MD.,have documented all relevant documentation on the behalf of Lelon Huh, MD,as directed by  Lelon Huh, MD while in the presence of Lelon Huh, MD.    Established patient visit   Patient: Roger Cook   DOB: Oct 22, 1926   88 y.o. Male  MRN: [id number] Visit Date: 03/26/2022  Today's healthcare provider: Lelon Huh, MD   Chief Complaint  Patient presents with   Weakness   Follow-up   Subjective    Weakness Associated symptoms include weakness. Pertinent negatives include no abdominal pain, chest pain, chills, fever, nausea or vomiting.    Follow up ER visit  Patient was seen in ER for confusion on 03/10/2022. He was treated for altered mental status. During ER visit labs were ordered, chest x ray and head CT revealing atrophy and chronic microvascular ischemic changes, but no acute changes.  Patient declined having an MRI since he was back to baseline. Patient was advised to follow-up with his regular doctor.   He reports good compliance with treatment. He reports this condition is Unchanged. Patient's wife reports that he has spells of confusion and dysphasia. He has had 4-5 of these since his ER visit. Is mostly back to baseline today, but his wife reports he has been more sleepy for the last few months.   He was noted to have bradycardia with heart rate of 49 at the ER. Also noted to have gradually worsening of anemia on labs from ER.  -----------------------------------------------------------------------------------------   Medications: Outpatient Medications Prior to Visit  Medication Sig   Apoaequorin (PREVAGEN) 10 MG CAPS Take by mouth.   ARTIFICIAL TEAR SOLUTION OP Place 1 drop into both eyes 2 (two) times daily as needed (dry eyes).   aspirin 81 MG tablet Take 81 mg by mouth daily.    brimonidine (ALPHAGAN) 0.2 % ophthalmic solution Place 1 drop into the left eye 2 (two) times daily.    Calcium Carbonate-Vit D-Min (CALCIUM 1200 PO) Take by mouth.   donepezil (ARICEPT) 10 MG tablet TAKE 1 TABLET BY MOUTH AT BEDTIME   fluticasone (FLONASE) 50 MCG/ACT nasal spray Place 2 sprays into both nostrils daily as needed for allergies.   isosorbide mononitrate (IMDUR) 60 MG 24 hr tablet Take 1 tablet (60 mg total) by mouth daily.   levothyroxine (SYNTHROID) 88 MCG tablet Take 1 tablet (88 mcg total) by mouth daily before breakfast.   memantine (NAMENDA) 10 MG tablet TAKE 1 TABLET BY MOUTH TWO TIMES DAILY   metoprolol succinate (TOPROL-XL) 25 MG 24 hr tablet Take 12.5 mg by mouth daily.   MULTIPLE VITAMIN PO Take 1 tablet by mouth daily.   NIFEdipine (PROCARDIA-XL/NIFEDICAL-XL) 30 MG 24 hr tablet Take 30 mg by mouth daily.   NON FORMULARY CPAP (Free Text) - Historical Medication  As directed  Started 22-Sep-1994 Active   pravastatin (PRAVACHOL) 40 MG tablet TAKE 1 TABLET BY MOUTH DAILY   PREVIDENT 5000 DRY MOUTH 1.1 % GEL dental gel Place 1 application onto teeth 2 (two) times daily.   No facility-administered medications prior to visit.    Review of Systems  Constitutional:  Negative for appetite change, chills and fever.  Respiratory:  Negative for chest tightness, shortness of breath and wheezing.   Cardiovascular:  Negative for chest pain and palpitations.  Gastrointestinal:  Negative for abdominal pain, nausea and vomiting.  Neurological:  Positive for speech difficulty (slurred speech) and weakness.  Psychiatric/Behavioral:  Positive for confusion.  Objective    BP (!) 145/44 (BP Location: Right Arm, Patient Position: Sitting, Cuff Size: Normal)   Pulse (!) 47   Temp (!) 97.5 F (36.4 C) (Oral)   Resp 16   Wt 150 lb (68 kg)   SpO2 98% Comment: room air  BMI 24.21 kg/m    Physical Exam    General: Appearance:    Well developed, well nourished male in no acute distress  Eyes:    PERRL, conjunctiva/corneas clear, EOM's intact       Lungs:     Clear to  auscultation bilaterally, respirations unlabored  Heart:    Bradycardic. Normal rhythm. No murmurs, rubs, or gallops.    MS:   All extremities are intact.    Neurologic:   Awake, alert, oriented x 1. No apparent focal neurological defect.         Assessment & Plan     1. Bradycardia  2. Near syncope  - LONG TERM MONITOR (3-14 DAYS); Future  3. TIA (transient ischemic attack)  - MR Angiogram Head Wo Contrast; Future  4. Iron deficiency anemia due to chronic blood loss  - CBC - Iron, TIBC and Ferritin Panel      The entirety of the information documented in the History of Present Illness, Review of Systems and Physical Exam were personally obtained by me. Portions of this information were initially documented by the CMA and reviewed by me for thoroughness and accuracy.     Lelon Huh, MD  [facility] [phone] (phone) [phone] (fax)  [place]

## 2022-03-27 LAB — CBC
Hematocrit: 31.4 % — ABNORMAL LOW (ref 37.5–51.0)
Hemoglobin: 9.9 g/dL — ABNORMAL LOW (ref 13.0–17.7)
MCH: 27.3 pg (ref 26.6–33.0)
MCHC: 31.5 g/dL (ref 31.5–35.7)
MCV: 87 fL (ref 79–97)
Platelets: 261 10*3/uL (ref 150–450)
RBC: 3.62 x10E6/uL — ABNORMAL LOW (ref 4.14–5.80)
RDW: 16.3 % — ABNORMAL HIGH (ref 11.6–15.4)
WBC: 7.1 10*3/uL (ref 3.4–10.8)

## 2022-03-27 LAB — IRON,TIBC AND FERRITIN PANEL
Ferritin: 12 ng/mL — ABNORMAL LOW (ref 30–400)
Iron Saturation: 10 % — ABNORMAL LOW (ref 15–55)
Iron: 38 ug/dL (ref 38–169)
Total Iron Binding Capacity: 388 ug/dL (ref 250–450)
UIBC: 350 ug/dL — ABNORMAL HIGH (ref 111–343)

## 2022-03-30 DIAGNOSIS — R55 Syncope and collapse: Secondary | ICD-10-CM

## 2022-04-03 ENCOUNTER — Other Ambulatory Visit: Payer: Self-pay | Admitting: Family Medicine

## 2022-04-03 DIAGNOSIS — R413 Other amnesia: Secondary | ICD-10-CM

## 2022-04-03 NOTE — Telephone Encounter (Signed)
Refilled 99/29/2022 #90 4 refills - should last until 08/2022. Requested Prescriptions  Pending Prescriptions Disp Refills  . donepezil (ARICEPT) 10 MG tablet [Pharmacy Med Name: DONEPEZIL HCL 10 MG TAB] 90 tablet 4    Sig: TAKE 1 TABLET BY MOUTH AT BEDTIME     Neurology:  Alzheimer's Agents Passed - 04/03/2022  1:31 PM      Passed - Valid encounter within last 6 months    Recent Outpatient Visits          1 week ago Bradycardia   [facility] [name], MD   2 months ago Iron deficiency anemia due to chronic blood loss   [place], MD   3 months ago Encounter for [employer] annual wellness exam   [facility] [name], MD   5 months ago Moderate dementia without behavioral disturbance, psychotic disturbance, mood disturbance, or anxiety, unspecified dementia type   [facility] [name], MD   8 months ago Essential hypertension   [place], Kirstie Peri, MD      Future Appointments            In 1 week Stoioff, Ronda Fairly, MD [place]   In 1 month Fisher, Kirstie Peri, MD [facility], [place]

## 2022-04-10 DIAGNOSIS — G459 Transient cerebral ischemic attack, unspecified: Secondary | ICD-10-CM | POA: Diagnosis not present

## 2022-04-14 ENCOUNTER — Ambulatory Visit: Payer: Self-pay | Admitting: Urology

## 2022-04-14 ENCOUNTER — Encounter: Payer: Self-pay | Admitting: Urology

## 2022-04-14 DIAGNOSIS — R55 Syncope and collapse: Secondary | ICD-10-CM | POA: Diagnosis not present

## 2022-04-18 ENCOUNTER — Inpatient Hospital Stay: Admission: RE | Admit: 2022-04-18 | Payer: Medicare Other | Source: Ambulatory Visit

## 2022-04-21 ENCOUNTER — Telehealth: Payer: Self-pay

## 2022-04-21 NOTE — Telephone Encounter (Signed)
Copied from [place] [id number]. Topic: General - Other >> Apr 21, 2022 11:10 AM Penni Bombard wrote: Reason for CRM: Pt's wife called in asking what the results were from his MRI.  CB@  [phone]

## 2022-04-21 NOTE — Telephone Encounter (Signed)
Spoke with pt's wife who was confused about date of head CT which was 03-10-22 and ordered by Ashok Cordia, PA-C. Wife advised of result.

## 2022-04-24 ENCOUNTER — Telehealth: Payer: Self-pay | Admitting: *Deleted

## 2022-04-24 ENCOUNTER — Inpatient Hospital Stay: Admission: RE | Admit: 2022-04-24 | Payer: Medicare Other | Source: Ambulatory Visit

## 2022-04-24 NOTE — Telephone Encounter (Signed)
Pt daughter is calling to see if [facility] can be contacted to get the results for MRI. Please advise CB- [phone]

## 2022-04-24 NOTE — Telephone Encounter (Signed)
Calling for MRI results- Patient had MRI in [place] (open) within last 2 weeks- does not have exact date. They have not received results. The original appointment was not in [place] due to request for open MRI- daughter states appointment was scheduled by [facility]. I did not see result in chart- call sent to office for follow up. Advised will have office call her back: Kae Heller [phone]

## 2022-04-24 NOTE — Telephone Encounter (Signed)
Pt's daughter called requesting to speak with the office regarding missing fax. They may have the wrong fax number. She is asking for a call back today  [facility] has sent over a fax today.

## 2022-04-25 NOTE — Telephone Encounter (Signed)
Please advise 

## 2022-04-25 NOTE — Telephone Encounter (Signed)
Pts daughter would like someone to call [facility] to get the MRI results if they havent been received / pt stated they faxed it on 7.21.23 after the MRI was done / please call and advise pt / she doesn't want to keep going back and forth and would really like a call from Dr. Maralyn Sago nurse only

## 2022-04-25 NOTE — Telephone Encounter (Signed)
Patient's daughter was advised in office earlier today.

## 2022-05-06 ENCOUNTER — Telehealth: Payer: Self-pay

## 2022-05-06 DIAGNOSIS — F02B0 Dementia in other diseases classified elsewhere, moderate, without behavioral disturbance, psychotic disturbance, mood disturbance, and anxiety: Secondary | ICD-10-CM

## 2022-05-06 NOTE — Telephone Encounter (Signed)
I returned patients daughter, [name]'s call. She states she came up to our office on 07/4 and spoke with a clinical staff person about the imaging test results. [name] states that that clinical staff person told her that Dr. Caryn Section wanted to refer patient to a neurologist as the next step. Chong Sicilian says she has not heard back from anyone about this referral. I didn't see any notations about a neurology referral. Please review and advise if neurology referral  was supposed to be placed.

## 2022-05-06 NOTE — Addendum Note (Signed)
Addended by: [name] on: 05/06/2022 08:11 PM   Modules accepted: Orders

## 2022-05-06 NOTE — Telephone Encounter (Signed)
Referral was placed back on April, but I don't think neurology office was able to get in touch with him. Have placed a new referral. Call us if they don't hear from the neurology office within a week.

## 2022-05-06 NOTE — Telephone Encounter (Signed)
Copied from [place] [id number]. Topic: Referral - Status >> May 06, 2022  2:28 PM Leilani Able wrote: Reason for CRM: Roger Cook pt daughter states on 8/3 when she was in the office that Dr Caryn Section was to do a referral to neurology and she is wanting to know why it was not done. She wants Dr Maralyn Sago nurse to call her back and talk thru seemed upset so I apologized and mentioned it does show MRI on 8/3 canceled and states (hospitalized) and stateted that may have been why he was waiting and she said that was incorrect he was not hospitalized then, just FYI specific wanted Dr Maralyn Sago on site nurse. [phone]

## 2022-05-07 NOTE — Telephone Encounter (Signed)
Patient's daughter Roger Cook advised and verbalized understanding. Roger Cook prefers that Neurology contact her with the appointment since patient may not remember what this is about. Please contact [name] with Neurology appt.   Kae Heller [phone]

## 2022-05-07 NOTE — Addendum Note (Signed)
Addended by: Randal Buba on: 05/07/2022 01:10 PM   Modules accepted: Orders

## 2022-05-12 ENCOUNTER — Ambulatory Visit: Payer: Medicare Other | Admitting: Family Medicine

## 2022-05-12 NOTE — Progress Notes (Deleted)
      Established patient visit   Patient: Roger Cook   DOB: 08-06-1927   88 y.o. Male  MRN: [id number] Visit Date: 05/12/2022  Today's healthcare provider: Lelon Huh, MD   No chief complaint on file.  Subjective    Follow up for Hypothyroidism   The patient was last seen for this 4 months ago. Changes made at last visit include: Continue current medication.  He reports {excellent/good/fair/poor:19665} compliance with treatment. He feels that condition is {improved/worse/unchanged:3041574}. He {is/is not:21021397} having side effects. ***  -----------------------------------------------------------------------------------------  Follow up for Iron deficiency anemia due to chronic blood loss  The patient was last seen for this 4 months ago. Changes made at last visit include: Start OTC ferrous sulfate 325 once daily.  He reports {excellent/good/fair/poor:19665} compliance with treatment. He feels that condition is {improved/worse/unchanged:3041574}. He {is/is not:21021397} having side effects. ***  -----------------------------------------------------------------------------------------   Medications: Outpatient Medications Prior to Visit  Medication Sig   Apoaequorin (PREVAGEN) 10 MG CAPS Take by mouth.   ARTIFICIAL TEAR SOLUTION OP Place 1 drop into both eyes 2 (two) times daily as needed (dry eyes).   aspirin 81 MG tablet Take 81 mg by mouth daily.    brimonidine (ALPHAGAN) 0.2 % ophthalmic solution Place 1 drop into the left eye 2 (two) times daily.   Calcium Carbonate-Vit D-Min (CALCIUM 1200 PO) Take by mouth.   donepezil (ARICEPT) 10 MG tablet TAKE 1 TABLET BY MOUTH AT BEDTIME   fluticasone (FLONASE) 50 MCG/ACT nasal spray Place 2 sprays into both nostrils daily as needed for allergies.   isosorbide mononitrate (IMDUR) 60 MG 24 hr tablet Take 1 tablet (60 mg total) by mouth daily.   levothyroxine (SYNTHROID) 88 MCG tablet Take 1 tablet (88 mcg total) by  mouth daily before breakfast.   memantine (NAMENDA) 10 MG tablet TAKE 1 TABLET BY MOUTH TWO TIMES DAILY   metoprolol succinate (TOPROL-XL) 25 MG 24 hr tablet Take 12.5 mg by mouth daily.   MULTIPLE VITAMIN PO Take 1 tablet by mouth daily.   NIFEdipine (PROCARDIA-XL/NIFEDICAL-XL) 30 MG 24 hr tablet Take 30 mg by mouth daily.   NON FORMULARY CPAP (Free Text) - Historical Medication  As directed  Started 22-Sep-1994 Active   pravastatin (PRAVACHOL) 40 MG tablet TAKE 1 TABLET BY MOUTH DAILY   PREVIDENT 5000 DRY MOUTH 1.1 % GEL dental gel Place 1 application onto teeth 2 (two) times daily.   No facility-administered medications prior to visit.    Review of Systems  {Labs  Heme  Chem  Endocrine  Serology  Results Review (optional):23779}   Objective    There were no vitals taken for this visit. {Show previous vital signs (optional):23777}  Physical Exam  ***  No results found for any visits on 05/12/22.  Assessment & Plan     ***  No follow-ups on file.      {provider attestation***:1}   Lelon Huh, MD  [facility] [phone] (phone) [phone] (fax)  [place]

## 2022-06-19 DIAGNOSIS — R413 Other amnesia: Secondary | ICD-10-CM | POA: Diagnosis not present

## 2022-06-19 DIAGNOSIS — F39 Unspecified mood [affective] disorder: Secondary | ICD-10-CM | POA: Diagnosis not present

## 2022-06-23 ENCOUNTER — Ambulatory Visit: Payer: Medicare Other | Admitting: Family Medicine

## 2022-06-23 NOTE — Progress Notes (Deleted)
Established patient visit   Patient: Roger Cook   DOB: Mar 04, 1927   88 y.o. Male  MRN: [id number] Visit Date: 06/23/2022  Today's healthcare provider: Lelon Huh, MD   No chief complaint on file.  Subjective    HPI  Hypothyroid, follow-up  Lab Results  Component Value Date   TSH 0.685 12/24/2021   TSH 0.629 12/23/2021   TSH 2.610 07/23/2021   FREET4 1.21 07/23/2021   T4TOTAL 6.4 02/05/2017   T4TOTAL 6.2 11/07/2016    Wt Readings from Last 3 Encounters:  03/26/22 150 lb (68 kg)  03/10/22 150 lb (68 kg)  01/07/22 149 lb 9.6 oz (67.9 kg)    He was last seen for hypothyroid 6 months ago.  Management since that visit includes no changes. He reports {excellent/good/fair/poor:19665} compliance with treatment. He {is/is not:21021397} having side effects. {document side effects if present:1}  Symptoms: {Yes/No:20286} change in energy level {Yes/No:20286} constipation  {Yes/No:20286} diarrhea {Yes/No:20286} heat / cold intolerance  {Yes/No:20286} nervousness {Yes/No:20286} palpitations  {Yes/No:20286} weight changes    -----------------------------------------------------------------------------------------  Follow up for iron deficiency anemia  The patient was last seen for this 3 months ago. Changes made at last visit include take OTC ferrous sulfate '325mg'$  once a day.  He reports {excellent/good/fair/poor:19665} compliance with treatment. He feels that condition is {improved/worse/unchanged:3041574}. He {is/is not:21021397} having side effects. ***  -----------------------------------------------------------------------------------------   Medications: Outpatient Medications Prior to Visit  Medication Sig   Apoaequorin (PREVAGEN) 10 MG CAPS Take by mouth.   ARTIFICIAL TEAR SOLUTION OP Place 1 drop into both eyes 2 (two) times daily as needed (dry eyes).   aspirin 81 MG tablet Take 81 mg by mouth daily.    brimonidine (ALPHAGAN) 0.2 % ophthalmic  solution Place 1 drop into the left eye 2 (two) times daily.   Calcium Carbonate-Vit D-Min (CALCIUM 1200 PO) Take by mouth.   donepezil (ARICEPT) 10 MG tablet TAKE 1 TABLET BY MOUTH AT BEDTIME   fluticasone (FLONASE) 50 MCG/ACT nasal spray Place 2 sprays into both nostrils daily as needed for allergies.   isosorbide mononitrate (IMDUR) 60 MG 24 hr tablet Take 1 tablet (60 mg total) by mouth daily.   levothyroxine (SYNTHROID) 88 MCG tablet Take 1 tablet (88 mcg total) by mouth daily before breakfast.   memantine (NAMENDA) 10 MG tablet TAKE 1 TABLET BY MOUTH TWO TIMES DAILY   metoprolol succinate (TOPROL-XL) 25 MG 24 hr tablet Take 12.5 mg by mouth daily.   MULTIPLE VITAMIN PO Take 1 tablet by mouth daily.   NIFEdipine (PROCARDIA-XL/NIFEDICAL-XL) 30 MG 24 hr tablet Take 30 mg by mouth daily.   NON FORMULARY CPAP (Free Text) - Historical Medication  As directed  Started 22-Sep-1994 Active   pravastatin (PRAVACHOL) 40 MG tablet TAKE 1 TABLET BY MOUTH DAILY   PREVIDENT 5000 DRY MOUTH 1.1 % GEL dental gel Place 1 application onto teeth 2 (two) times daily.   No facility-administered medications prior to visit.    Review of Systems  {Labs  Heme  Chem  Endocrine  Serology  Results Review (optional):23779}   Objective    There were no vitals taken for this visit. {Show previous vital signs (optional):23777}  Physical Exam  ***  No results found for any visits on 06/23/22.  Assessment & Plan     ***  No follow-ups on file.      {provider attestation***:1}   Lelon Huh, MD  [facility] [phone] (phone) [phone] (fax)  [place]

## 2022-06-24 ENCOUNTER — Ambulatory Visit: Payer: Medicare Other | Admitting: Physician Assistant

## 2022-06-24 DIAGNOSIS — D303 Benign neoplasm of bladder: Secondary | ICD-10-CM

## 2022-06-26 DIAGNOSIS — L570 Actinic keratosis: Secondary | ICD-10-CM | POA: Diagnosis not present

## 2022-06-26 DIAGNOSIS — Z85828 Personal history of other malignant neoplasm of skin: Secondary | ICD-10-CM | POA: Diagnosis not present

## 2022-06-26 DIAGNOSIS — D2261 Melanocytic nevi of right upper limb, including shoulder: Secondary | ICD-10-CM | POA: Diagnosis not present

## 2022-06-26 DIAGNOSIS — D2262 Melanocytic nevi of left upper limb, including shoulder: Secondary | ICD-10-CM | POA: Diagnosis not present

## 2022-06-26 DIAGNOSIS — D2271 Melanocytic nevi of right lower limb, including hip: Secondary | ICD-10-CM | POA: Diagnosis not present

## 2022-06-30 ENCOUNTER — Ambulatory Visit (INDEPENDENT_AMBULATORY_CARE_PROVIDER_SITE_OTHER): Payer: Medicare Other | Admitting: Family Medicine

## 2022-06-30 VITALS — BP 154/46 | HR 63 | Temp 97.7°F | Wt 152.0 lb

## 2022-06-30 DIAGNOSIS — I25119 Atherosclerotic heart disease of native coronary artery with unspecified angina pectoris: Secondary | ICD-10-CM

## 2022-06-30 DIAGNOSIS — R399 Unspecified symptoms and signs involving the genitourinary system: Secondary | ICD-10-CM | POA: Diagnosis not present

## 2022-06-30 DIAGNOSIS — N481 Balanitis: Secondary | ICD-10-CM | POA: Diagnosis not present

## 2022-06-30 DIAGNOSIS — Z960 Presence of urogenital implants: Secondary | ICD-10-CM

## 2022-06-30 MED ORDER — AMOXICILLIN-POT CLAVULANATE 875-125 MG PO TABS: 1.0000 | ORAL_TABLET | Freq: Two times a day (BID) | ORAL | 0 refills | Status: DC

## 2022-06-30 MED ORDER — KETOCONAZOLE 2 % EX CREA: TOPICAL_CREAM | CUTANEOUS | 0 refills | Status: DC

## 2022-06-30 NOTE — Progress Notes (Signed)
Established patient visit   Patient: Roger Cook   DOB: 06/05/1927   88 y.o. Male  MRN: [id number] Visit Date: 06/30/2022  Today's healthcare provider: Lelon Huh, MD   No chief complaint on file.  Subjective    HPI  Patient is a 88 year old male who presents today for evaluation of possible urinary tract infection.  Patient and wife are both contributing to information today.   Patient states he has been having penile pain for 4-6 weeks.  Patient denies burning or frequency but wife states he does urinate more frequently that she thinks he should.  They deny blood in the urine but wife states she has noticed odor to the urine, stains on clothing and thinks there may be drainage.  Patient denies any redness, swelling or sores of the penis.   Patient has attempted to collect a urine specimen but has so far been unable.   Medications: Outpatient Medications Prior to Visit  Medication Sig   Apoaequorin (PREVAGEN) 10 MG CAPS Take by mouth.   ARTIFICIAL TEAR SOLUTION OP Place 1 drop into both eyes 2 (two) times daily as needed (dry eyes).   aspirin 81 MG tablet Take 81 mg by mouth daily.    brimonidine (ALPHAGAN) 0.2 % ophthalmic solution Place 1 drop into the left eye 2 (two) times daily.   Calcium Carbonate-Vit D-Min (CALCIUM 1200 PO) Take by mouth.   donepezil (ARICEPT) 10 MG tablet TAKE 1 TABLET BY MOUTH AT BEDTIME   fluticasone (FLONASE) 50 MCG/ACT nasal spray Place 2 sprays into both nostrils daily as needed for allergies.   isosorbide mononitrate (IMDUR) 60 MG 24 hr tablet Take 1 tablet (60 mg total) by mouth daily.   levothyroxine (SYNTHROID) 88 MCG tablet Take 1 tablet (88 mcg total) by mouth daily before breakfast.   memantine (NAMENDA) 10 MG tablet TAKE 1 TABLET BY MOUTH TWO TIMES DAILY   metoprolol succinate (TOPROL-XL) 25 MG 24 hr tablet Take 12.5 mg by mouth daily.   MULTIPLE VITAMIN PO Take 1 tablet by mouth daily.   NIFEdipine (PROCARDIA-XL/NIFEDICAL-XL) 30 MG  24 hr tablet Take 30 mg by mouth daily.   NON FORMULARY CPAP (Free Text) - Historical Medication  As directed  Started 22-Sep-1994 Active   pravastatin (PRAVACHOL) 40 MG tablet TAKE 1 TABLET BY MOUTH DAILY   PREVIDENT 5000 DRY MOUTH 1.1 % GEL dental gel Place 1 application onto teeth 2 (two) times daily.   No facility-administered medications prior to visit.        Objective    BP (!) 177/48 (BP Location: Right Arm, Patient Position: Sitting, Cuff Size: Normal)   Pulse 63   Temp 97.7 F (36.5 C) (Oral)   Wt 152 lb (68.9 kg)   SpO2 99%   BMI 24.53 kg/m  Vitals:   06/30/22 1550 06/30/22 1551  BP: (!) 177/48 (!) 154/46  Pulse: 63   Temp: 97.7 F (36.5 C)   TempSrc: Oral   SpO2: 99%   Weight: 152 lb (68.9 kg)      Physical Exam  Diffusely inflamed slightly swollen, slightly tender penile glans. Dribbling urine. No purulent discharge.   Assessment & Plan     1. Balanitis  - amoxicillin-clavulanate (AUGMENTIN) 875-125 MG tablet; Take 1 tablet by mouth 2 (two) times daily.  Dispense: 20 tablet; Refill: 0 - ketoconazole (NIZORAL) 2 % cream; Apply to glans of penis daily  Dispense: 30 g; Refill: 0  2. History of penile implant  Consider referral to urology if balanitis not resolving quickley.   3. Urinary tract infection symptoms He was unable to collect sample for urinalysis today.       The entirety of the information documented in the History of Present Illness, Review of Systems and Physical Exam were personally obtained by me. Portions of this information were initially documented by the CMA and reviewed by me for thoroughness and accuracy.     Lelon Huh, MD  [facility] [phone] (phone) [phone] (fax)  [place]

## 2022-07-01 ENCOUNTER — Other Ambulatory Visit: Payer: Self-pay

## 2022-07-01 DIAGNOSIS — R399 Unspecified symptoms and signs involving the genitourinary system: Secondary | ICD-10-CM

## 2022-07-01 DIAGNOSIS — N481 Balanitis: Secondary | ICD-10-CM | POA: Diagnosis not present

## 2022-07-02 DIAGNOSIS — Z23 Encounter for immunization: Secondary | ICD-10-CM | POA: Diagnosis not present

## 2022-07-03 DIAGNOSIS — R4182 Altered mental status, unspecified: Secondary | ICD-10-CM | POA: Insufficient documentation

## 2022-07-03 DIAGNOSIS — F0390 Unspecified dementia without behavioral disturbance: Secondary | ICD-10-CM | POA: Diagnosis not present

## 2022-07-03 DIAGNOSIS — I639 Cerebral infarction, unspecified: Secondary | ICD-10-CM | POA: Insufficient documentation

## 2022-07-03 DIAGNOSIS — R29810 Facial weakness: Secondary | ICD-10-CM | POA: Diagnosis not present

## 2022-07-03 DIAGNOSIS — H5789 Other specified disorders of eye and adnexa: Secondary | ICD-10-CM | POA: Diagnosis not present

## 2022-07-03 DIAGNOSIS — Z20822 Contact with and (suspected) exposure to covid-19: Secondary | ICD-10-CM | POA: Insufficient documentation

## 2022-07-03 DIAGNOSIS — E860 Dehydration: Secondary | ICD-10-CM | POA: Diagnosis not present

## 2022-07-03 DIAGNOSIS — R82998 Other abnormal findings in urine: Secondary | ICD-10-CM | POA: Diagnosis present

## 2022-07-03 LAB — URINE CULTURE

## 2022-07-03 LAB — SPECIMEN STATUS REPORT

## 2022-07-04 ENCOUNTER — Other Ambulatory Visit: Payer: Self-pay

## 2022-07-04 ENCOUNTER — Encounter: Payer: Self-pay | Admitting: Emergency Medicine

## 2022-07-04 ENCOUNTER — Emergency Department
Admission: EM | Admit: 2022-07-04 | Discharge: 2022-07-04 | Disposition: A | Discharge status: Home / Self Care | Payer: Medicare Other | Attending: Student in an Organized Health Care Education/Training Program | Admitting: Student in an Organized Health Care Education/Training Program

## 2022-07-04 ENCOUNTER — Emergency Department: Payer: Medicare Other

## 2022-07-04 DIAGNOSIS — E860 Dehydration: Secondary | ICD-10-CM | POA: Diagnosis not present

## 2022-07-04 LAB — COMPREHENSIVE METABOLIC PANEL
ALT: 20 U/L (ref 0–44)
AST: 25 U/L (ref 15–41)
Albumin: 3.8 g/dL (ref 3.5–5.0)
Alkaline Phosphatase: 52 U/L (ref 38–126)
Anion gap: 10 (ref 5–15)
BUN: 27 mg/dL — ABNORMAL HIGH (ref 8–23)
CO2: 22 mmol/L (ref 22–32)
Calcium: 9 mg/dL (ref 8.9–10.3)
Chloride: 108 mmol/L (ref 98–111)
Creatinine, Ser: 1.37 mg/dL — ABNORMAL HIGH (ref 0.61–1.24)
GFR, Estimated: 48 mL/min — ABNORMAL LOW (ref >60)
Glucose, Bld: 155 mg/dL — ABNORMAL HIGH (ref 70–99)
Potassium: 4.5 mmol/L (ref 3.5–5.1)
Sodium: 140 mmol/L (ref 135–145)
Total Bilirubin: 0.5 mg/dL (ref 0.3–1.2)
Total Protein: 7.2 g/dL (ref 6.5–8.1)

## 2022-07-04 LAB — URINALYSIS, MICROSCOPIC (REFLEX)
Bacteria, UA: NONE SEEN
RBC / HPF: >50 RBC/hpf (ref 0–5)

## 2022-07-04 LAB — CBC
HCT: 36.3 % — ABNORMAL LOW (ref 39.0–52.0)
Hemoglobin: 11.5 g/dL — ABNORMAL LOW (ref 13.0–17.0)
MCH: 30.6 pg (ref 26.0–34.0)
MCHC: 31.7 g/dL (ref 30.0–36.0)
MCV: 96.5 fL (ref 80.0–100.0)
Platelets: 194 10*3/uL (ref 150–400)
RBC: 3.76 MIL/uL — ABNORMAL LOW (ref 4.22–5.81)
RDW: 14.3 % (ref 11.5–15.5)
WBC: 11.1 10*3/uL — ABNORMAL HIGH (ref 4.0–10.5)
nRBC: 0 % (ref 0.0–0.2)

## 2022-07-04 LAB — PROTIME-INR
INR: 1.1 (ref 0.8–1.2)
Prothrombin Time: 14 seconds (ref 11.4–15.2)

## 2022-07-04 LAB — URINALYSIS, ROUTINE W REFLEX MICROSCOPIC
Bilirubin Urine: NEGATIVE
Glucose, UA: NEGATIVE mg/dL
Ketones, ur: NEGATIVE mg/dL
Nitrite: NEGATIVE
Protein, ur: 100 mg/dL — AB
Specific Gravity, Urine: >1.03 — ABNORMAL HIGH (ref 1.005–1.030)
pH: 5.5 (ref 5.0–8.0)

## 2022-07-04 LAB — CBG MONITORING, ED: Glucose-Capillary: 148 mg/dL — ABNORMAL HIGH (ref 70–99)

## 2022-07-04 LAB — DIFFERENTIAL
Abs Immature Granulocytes: 0.03 10*3/uL (ref 0.00–0.07)
Basophils Absolute: 0 10*3/uL (ref 0.0–0.1)
Basophils Relative: 0 %
Eosinophils Absolute: 0.2 10*3/uL (ref 0.0–0.5)
Eosinophils Relative: 1 %
Immature Granulocytes: 0 %
Lymphocytes Relative: 3 %
Lymphs Abs: 0.4 10*3/uL — ABNORMAL LOW (ref 0.7–4.0)
Monocytes Absolute: 0.9 10*3/uL (ref 0.1–1.0)
Monocytes Relative: 9 %
Neutro Abs: 9.6 10*3/uL — ABNORMAL HIGH (ref 1.7–7.7)
Neutrophils Relative %: 87 %

## 2022-07-04 LAB — SARS CORONAVIRUS 2 BY RT PCR: SARS Coronavirus 2 by RT PCR: NEGATIVE

## 2022-07-04 LAB — ETHANOL: Alcohol, Ethyl (B): <10 mg/dL (ref <10)

## 2022-07-04 LAB — APTT: aPTT: 30 seconds (ref 24–36)

## 2022-07-04 MED ORDER — SODIUM CHLORIDE 0.9 % IV BOLUS
500.0000 mL | Freq: Once | INTRAVENOUS | Status: AC
  Administered 2022-07-04: 500 mL via INTRAVENOUS

## 2022-07-04 NOTE — ED Provider Notes (Signed)
[facility] Provider Note    Event Date/Time   First MD Initiated Contact with Patient 07/04/22 [id number]     (approximate)   History   Altered Mental Status   HPI  Roger Cook is a 88 y.o. male with a history of dementia presents to the ER for evaluation of increasing confusion and episodes of staring.  Family reports that this is happening more frequently.  Was recently treated for UTI but urine culture was negative.  Had recent follow-up with neurology.  Denies any weakness denies any pain.  No nausea or vomiting.  No fevers.  No cough or shortness of breath.  Family members report the patient just seems like he is getting increasing more confused.  They have no concern for his safety or not seeking placement.  He also reported some concern for right eye swelling last night which is what prompted him to come to the ER but this is since resolved.  Triage nurse documented noticing left facial weakness which on further discussion with family this is chronic patient has a history of Bell's palsy.     Physical Exam   Triage Vital Signs: ED Triage Vitals [07/04/22 0055]  Enc Vitals Group     BP (!) 154/68     Pulse Rate 67     Resp 18     Temp 98 F (36.7 C)     Temp Source Oral     SpO2 98 %     Weight 151 lb 14.4 oz (68.9 kg)     Height '5\' 6"'$  (1.676 m)     Head Circumference      Peak Flow      Pain Score 0     Pain Loc      Pain Edu?      Excl. in [place]?     Most recent vital signs: Vitals:   07/04/22 0550 07/04/22 0810  BP: (!) 147/62 (!) 185/53  Pulse: 73 70  Resp: 16 18  Temp: 98.7 F (37.1 C)   SpO2: 96% 100%     Constitutional: Alert  Eyes: Conjunctivae are normal.  Head: Atraumatic. Nose: No congestion/rhinnorhea. Mouth/Throat: Mucous membranes are moist.   Neck: Painless ROM.  Cardiovascular:   Good peripheral circulation. Respiratory: Normal respiratory effort.  No retractions.  Gastrointestinal: Soft and nontender.   Musculoskeletal:  no deformity Neurologic: Trace left-sided facial droop consistent with previously diagnosed Bell's palsy.  Remainder of cranial nerves are intact.  Moving all extremities.  Sensation intact throughout. Skin:  Skin is warm, dry and intact. No rash noted. Psychiatric: Mood and affect are normal. Speech and behavior are normal.    ED Results / Procedures / Treatments   Labs (all labs ordered are listed, but only abnormal results are displayed) Labs Reviewed  CBC - Abnormal; Notable for the following components:      Result Value   WBC 11.1 (*)    RBC 3.76 (*)    Hemoglobin 11.5 (*)    HCT 36.3 (*)    All other components within normal limits  DIFFERENTIAL - Abnormal; Notable for the following components:   Neutro Abs 9.6 (*)    Lymphs Abs 0.4 (*)    All other components within normal limits  COMPREHENSIVE METABOLIC PANEL - Abnormal; Notable for the following components:   Glucose, Bld 155 (*)    BUN 27 (*)    Creatinine, Ser 1.37 (*)    GFR, Estimated 48 (*)  All other components within normal limits  URINALYSIS, ROUTINE W REFLEX MICROSCOPIC - Abnormal; Notable for the following components:   Specific Gravity, Urine >1.030 (*)    Hgb urine dipstick LARGE (*)    Protein, ur 100 (*)    Leukocytes,Ua SMALL (*)    All other components within normal limits  CBG MONITORING, ED - Abnormal; Notable for the following components:   Glucose-Capillary 148 (*)    All other components within normal limits  SARS CORONAVIRUS 2 BY RT PCR  PROTIME-INR  APTT  ETHANOL  URINALYSIS, MICROSCOPIC (REFLEX)  I-STAT CREATININE, ED     EKG  ED ECG REPORT I, Merlyn Lot, the attending physician, personally viewed and interpreted this ECG.   Date: 07/04/2022  EKG Time: 1:10  Rate: 70  Rhythm: sinus  Axis: normal  Intervals:normal intervals  ST&T Change: no stemi, no depressions    RADIOLOGY Please see ED Course for my review and interpretation.  I personally  reviewed all radiographic images ordered to evaluate for the above acute complaints and reviewed radiology reports and findings.  These findings were personally discussed with the patient.  Please see medical record for radiology report.    PROCEDURES:  Critical Care performed:   Procedures   MEDICATIONS ORDERED IN ED: Medications  sodium chloride 0.9 % bolus 500 mL (500 mLs Intravenous New Bag/Given 07/04/22 1610)     IMPRESSION / MDM / ASSESSMENT AND PLAN / ED COURSE  I reviewed the triage vital signs and the nursing notes.                              Differential diagnosis includes, but is not limited to, Dehydration, sepsis, pna, uti, hypoglycemia, cva, drug effect, withdrawal, encephalitis  Patient presenting to the ER for evaluation of symptoms as described above.  Base on symptoms, risk factors and considered above differential, this presenting complaint could reflect a potentially life-threatening illness therefore the patient will be placed on continuous pulse oximetry and telemetry for monitoring.  Laboratory evaluation will be sent to evaluate for the above complaints.  CT imaging ordered the above differential on my review and interpretation does not show any evidence of subdural hematoma or mass.  Per radiology report no acute findings.  His blood work does show mild dehydration urinalysis not consistent with UTI just had recent culture which was negative.  He is not have any symptoms of dysuria.  We will give IV fluids I do think that he is a little bit dehydrated.  His abdominal exam is soft and benign.  He is tolerating p.o.  Discussed above differential possible etiologies of his symptoms and additional diagnostic considerations that could be performed here in the ER in the hospital including MRI further observation and monitoring and he will consider placement.  Family declining this.  States they feel comfortable with him going home and will discuss these findings with  neurology.  We discussed strict return precautions.    FINAL CLINICAL IMPRESSION(S) / ED DIAGNOSES   Final diagnoses:  Dehydration     Rx / DC Orders   ED Discharge Orders     None        Note:  This document was prepared using Dragon voice recognition software and may include unintentional dictation errors.    Merlyn Lot, MD 07/04/22 [phone]

## 2022-07-04 NOTE — ED Triage Notes (Signed)
Pt presents via POV with complaints of AMS - per Spouse the pt has Dementia and he "would sit and stare at me". She states that he would follow commands but he isn't at his baseline. Pt has a visible left sided facial droop that started around 12pm yesterday. LKW 12pm on 07/03/22. Denies CP or SOB.

## 2022-07-07 ENCOUNTER — Ambulatory Visit: Payer: Medicare Other | Admitting: Family Medicine

## 2022-07-09 ENCOUNTER — Ambulatory Visit (INDEPENDENT_AMBULATORY_CARE_PROVIDER_SITE_OTHER): Payer: Medicare Other | Admitting: Physician Assistant

## 2022-07-09 ENCOUNTER — Encounter: Payer: Self-pay | Admitting: Physician Assistant

## 2022-07-09 VITALS — BP 167/71 | HR 55 | Ht 66.0 in | Wt 153.1 lb

## 2022-07-09 DIAGNOSIS — I10 Essential (primary) hypertension: Secondary | ICD-10-CM

## 2022-07-09 DIAGNOSIS — N179 Acute kidney failure, unspecified: Secondary | ICD-10-CM

## 2022-07-09 DIAGNOSIS — R739 Hyperglycemia, unspecified: Secondary | ICD-10-CM | POA: Diagnosis not present

## 2022-07-09 NOTE — Assessment & Plan Note (Signed)
Historically, not just on this last ED visit Will check a1c, no history for comparison

## 2022-07-09 NOTE — Assessment & Plan Note (Signed)
Diastolic hypotension appears stable for patient Systolic hypertension improved from ED visit  Advised pt to continue to monitor at home

## 2022-07-09 NOTE — Progress Notes (Unsigned)
      I,Sha'taria Tyson,acting as a Education administrator for [employer], PA-C.,have documented all relevant documentation on the behalf of Mikey Kirschner, PA-C,as directed by  Mikey Kirschner, PA-C while in the presence of Mikey Kirschner, PA-C.  Established patient visit   Patient: Roger Cook   DOB: [birth year]   88 y.o. Male  MRN: [id number] Visit Date: 07/09/2022  Today's healthcare provider: Mikey Kirschner, PA-C   No chief complaint on file.  Subjective    HPI  Follow up Hospitalization  Patient was admitted to [facility] on 07/04/22 and discharged on 07/04/22. He was treated for dehydration. Treatment for this included blood work, IV fluids. Discuss findings with neuro. Telephone follow up was done on n/a He reports excellent compliance with treatment. He reports this condition is resolved.  -Patient has not spoken with neuro since leaving ED at this moment ----------------------------------------------------------------------------------------- -   Medications: Outpatient Medications Prior to Visit  Medication Sig   amoxicillin-clavulanate (AUGMENTIN) 875-125 MG tablet Take 1 tablet by mouth 2 (two) times daily.   Apoaequorin (PREVAGEN) 10 MG CAPS Take by mouth.   ARTIFICIAL TEAR SOLUTION OP Place 1 drop into both eyes 2 (two) times daily as needed (dry eyes).   aspirin 81 MG tablet Take 81 mg by mouth daily.    brimonidine (ALPHAGAN) 0.2 % ophthalmic solution Place 1 drop into the left eye 2 (two) times daily.   Calcium Carbonate-Vit D-Min (CALCIUM 1200 PO) Take by mouth.   donepezil (ARICEPT) 10 MG tablet TAKE 1 TABLET BY MOUTH AT BEDTIME   fluticasone (FLONASE) 50 MCG/ACT nasal spray Place 2 sprays into both nostrils daily as needed for allergies.   isosorbide mononitrate (IMDUR) 60 MG 24 hr tablet Take 1 tablet (60 mg total) by mouth daily.   ketoconazole (NIZORAL) 2 % cream Apply to glans of penis daily   levothyroxine (SYNTHROID) 88 MCG tablet Take 1 tablet (88 mcg total) by  mouth daily before breakfast.   memantine (NAMENDA) 10 MG tablet TAKE 1 TABLET BY MOUTH TWO TIMES DAILY   metoprolol succinate (TOPROL-XL) 25 MG 24 hr tablet Take 12.5 mg by mouth daily.   MULTIPLE VITAMIN PO Take 1 tablet by mouth daily.   NIFEdipine (PROCARDIA-XL/NIFEDICAL-XL) 30 MG 24 hr tablet Take 30 mg by mouth daily.   NON FORMULARY CPAP (Free Text) - Historical Medication  As directed  Started 22-Sep-1994 Active   pravastatin (PRAVACHOL) 40 MG tablet TAKE 1 TABLET BY MOUTH DAILY   PREVIDENT 5000 DRY MOUTH 1.1 % GEL dental gel Place 1 application onto teeth 2 (two) times daily.   No facility-administered medications prior to visit.    Review of Systems    Objective    Blood pressure (!) 168/44, pulse (!) 55, height '5\' 6"'$  (1.676 m), weight 153 lb 1.6 oz (69.4 kg), SpO2 100 %.   Physical Exam  ***  No results found for any visits on 07/09/22.  Assessment & Plan     ***  Repeat labs 2 weeks Continue hydrating  Any changes ed vs visit   No follow-ups on file.      I, Mikey Kirschner, PA-C have reviewed all documentation for this visit. The documentation on  07/09/2022  for the exam, diagnosis, procedures, and orders are all accurate and complete.  Mikey Kirschner, PA-C [facility] [address] [place], [place], [address] Office: [phone] Fax: [place]

## 2022-07-10 ENCOUNTER — Encounter: Payer: Self-pay | Admitting: Physician Assistant

## 2022-07-10 DIAGNOSIS — N179 Acute kidney failure, unspecified: Secondary | ICD-10-CM | POA: Insufficient documentation

## 2022-07-10 NOTE — Assessment & Plan Note (Addendum)
Advised to continue w/ hydration and will repeat bmp in 2 weeks from ED date

## 2022-07-14 IMAGING — CT CT HEAD W/O CM
4 series · 16 of 47 positions shown, 18 images · non-contrast
Comparison: Chest cyst February 25, 2011.

CLINICAL DATA: [AGE] male presents for evaluation of altered
mental status.



[Series 2: head bone · axial · 0.39mm/px · z∈[-40,-12]mm · 3 of 73 slices shown]
[im 8/73  bone]
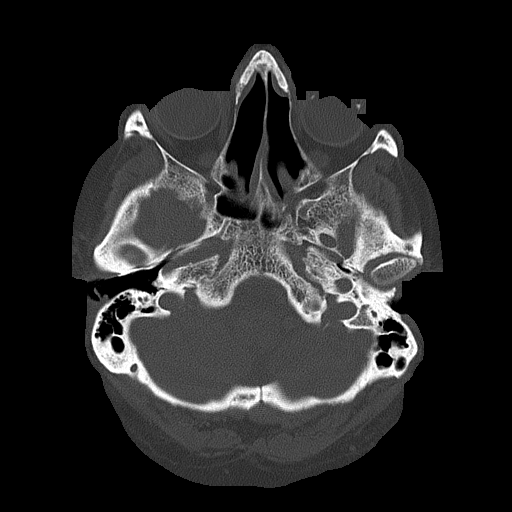
[im 15/73  bone]
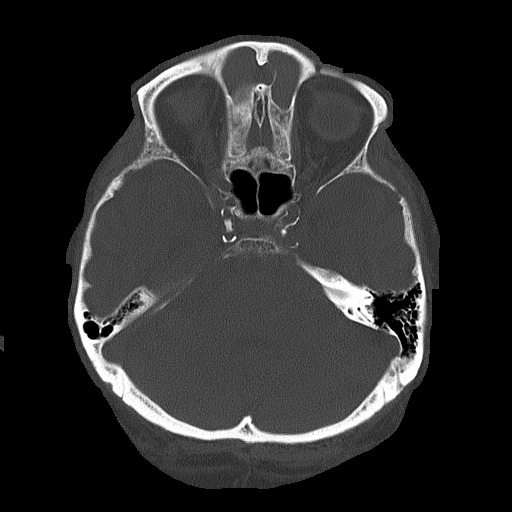
[im 22/73  bone]
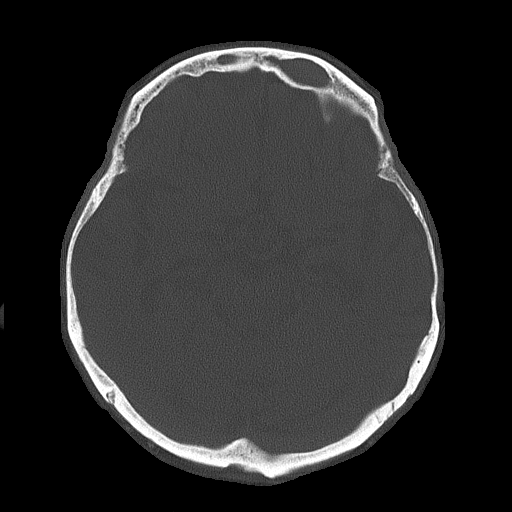

[Series 3: head wo · axial · 0.39mm/px · z∈[-39,+71]mm · 7 of 30 slices shown, 9 images]
[im 4/30  brain]
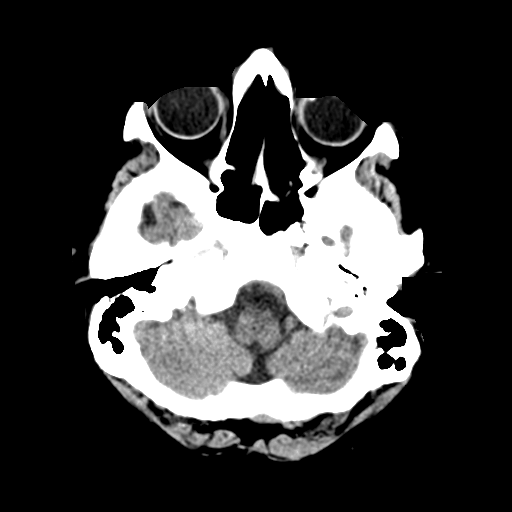
[im 4/30  bone]
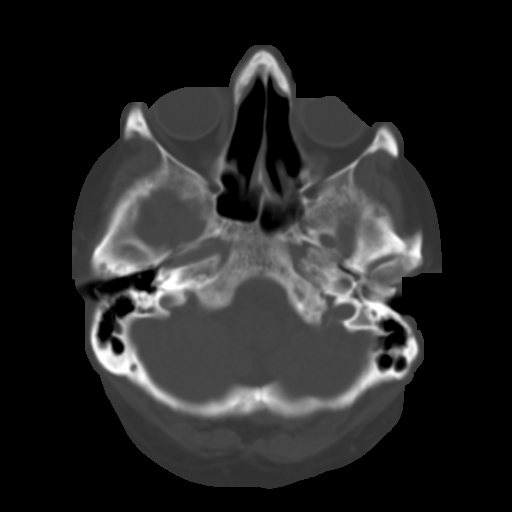
[im 8/30  brain]
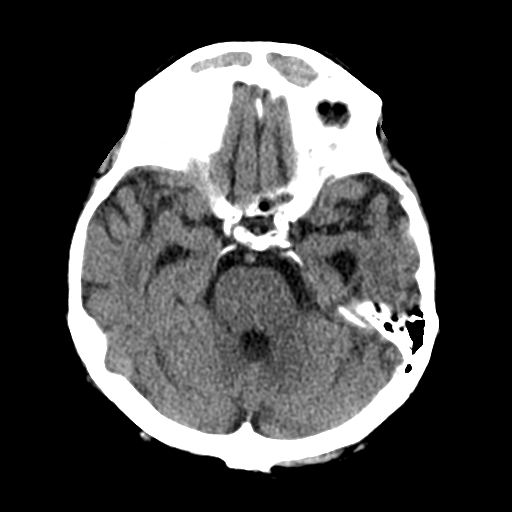
[im 11/30  brain]
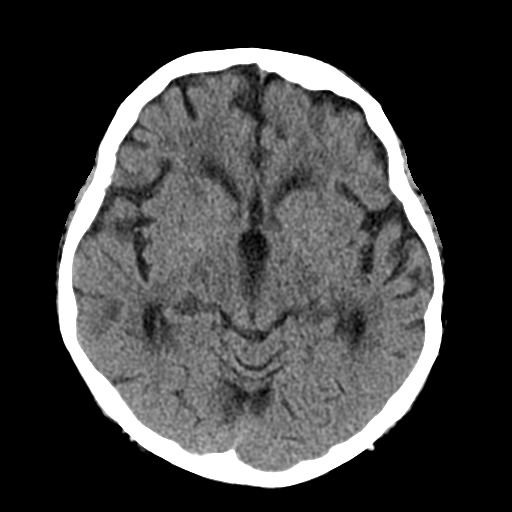
[im 15/30  brain]
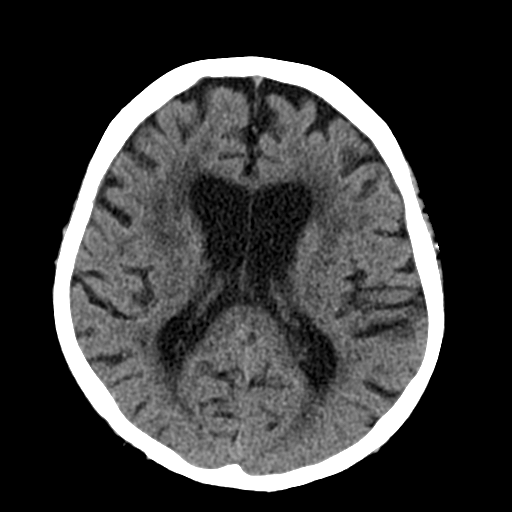
[im 19/30  brain]
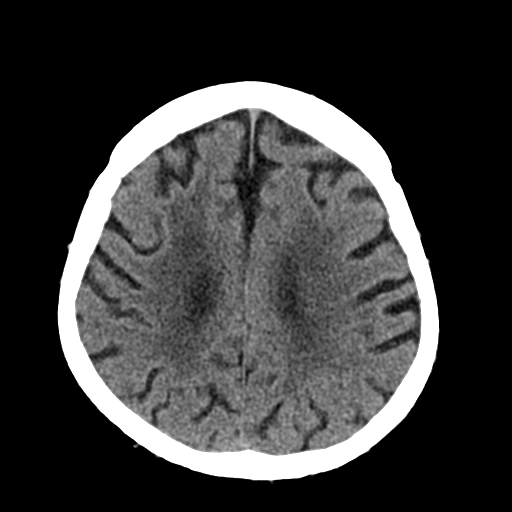
[im 19/30  bone]
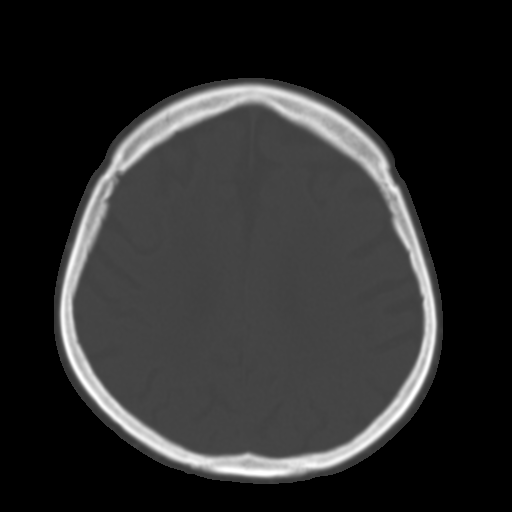
[im 22/30  brain]
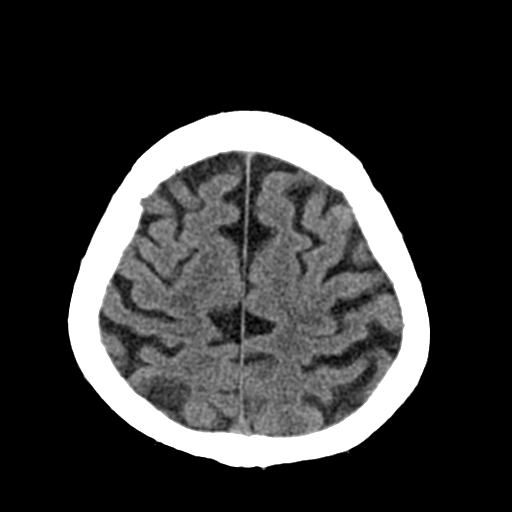
[im 26/30  brain]
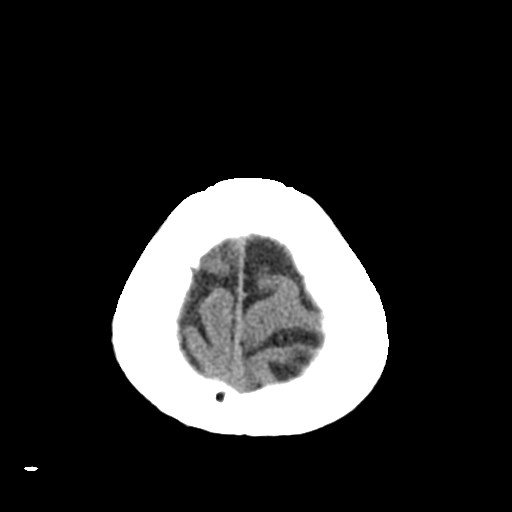

[Series 4: coronal soft tissue · coronal · 0.30mm/px · 3 of 61 slices shown]
[im 21/61  brain]
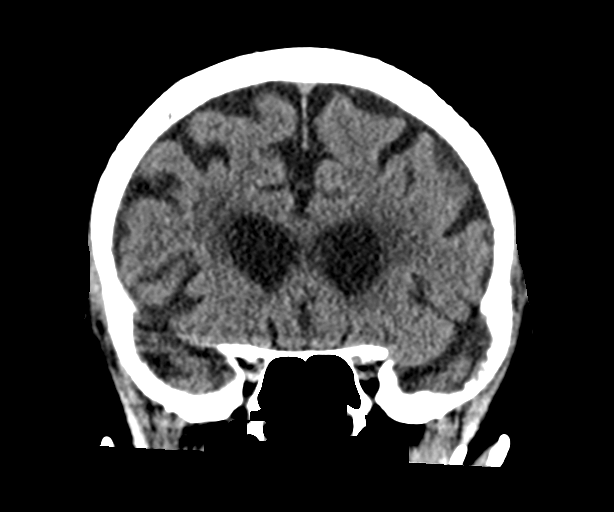
[im 27/61  brain]
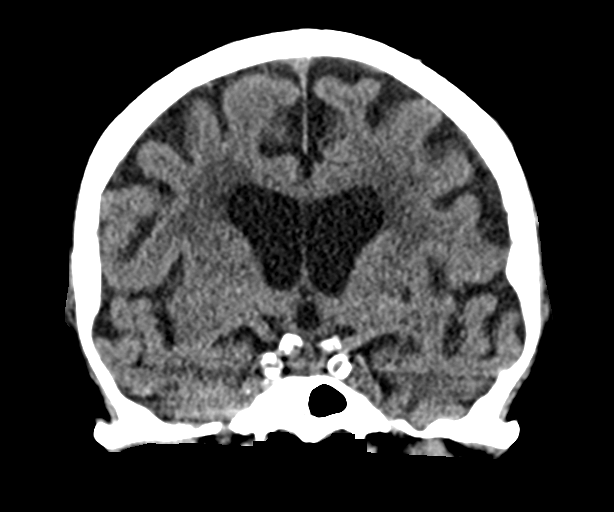
[im 34/61  brain]
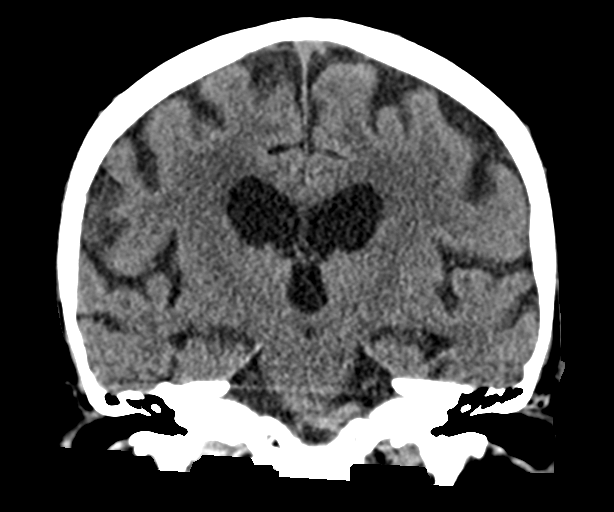

[Series 5: sagittal soft tissue · sagittal · 0.30mm/px · 3 of 63 slices shown]
[im 21/63  brain]
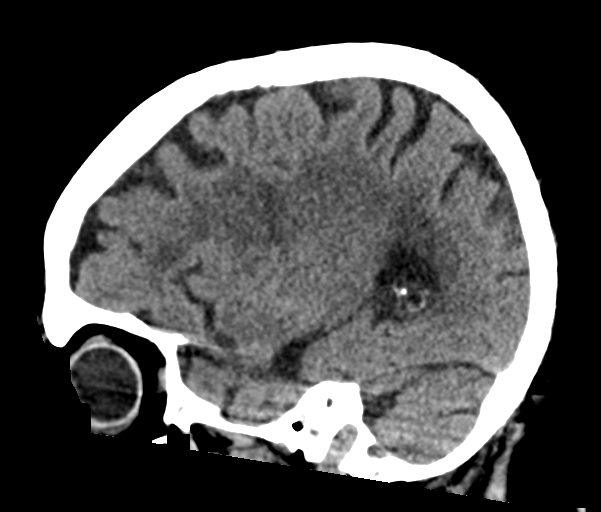
[im 32/63  brain]
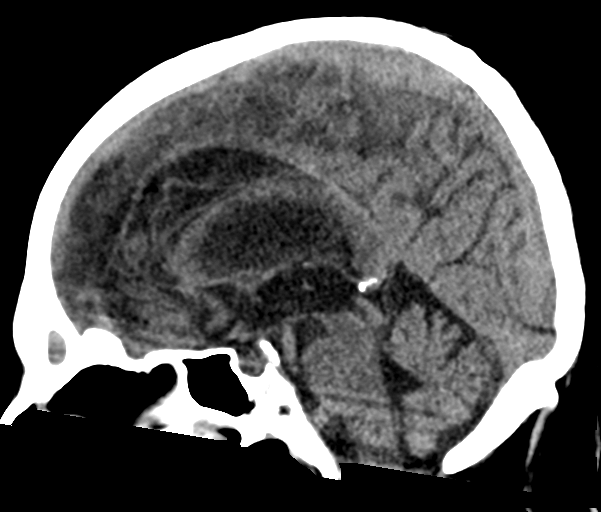
[im 42/63  brain]
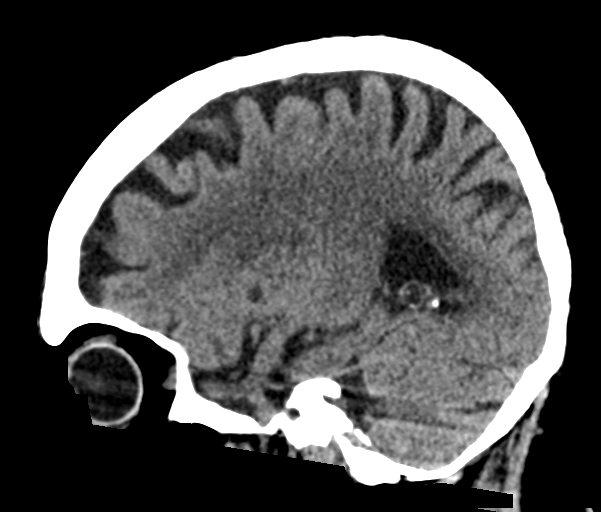

[16 of 47 positions shown; findings below may reference images not displayed]

FINDINGS: Brain: No evidence of acute infarction, hemorrhage, hydrocephalus,
extra-axial collection or mass lesion/mass effect. Signs of atrophy
and chronic microvascular ischemic change without substantial
change.

Vascular: No hyperdense vessel or unexpected calcification.

Skull: Normal. Negative for fracture or focal lesion.

Sinuses/Orbits: Signs of maxillary antrostomy with chronic complete
opacification of bilateral frontal sinuses. No air-fluid levels.
Findings are unchanged since [DATE].

Other: Mastoid air cells are clear.
IMPRESSION: 1. No acute intracranial abnormality.
2. Signs of atrophy and chronic microvascular ischemic change.

## 2022-07-14 IMAGING — DX DG CHEST 1V PORT
1 series · 1 of 1 positions shown · non-contrast
Comparison: April 08, 2021

CLINICAL DATA: Altered mental status.

EXAM:
PORTABLE CHEST 1 VIEW

[chest ap]
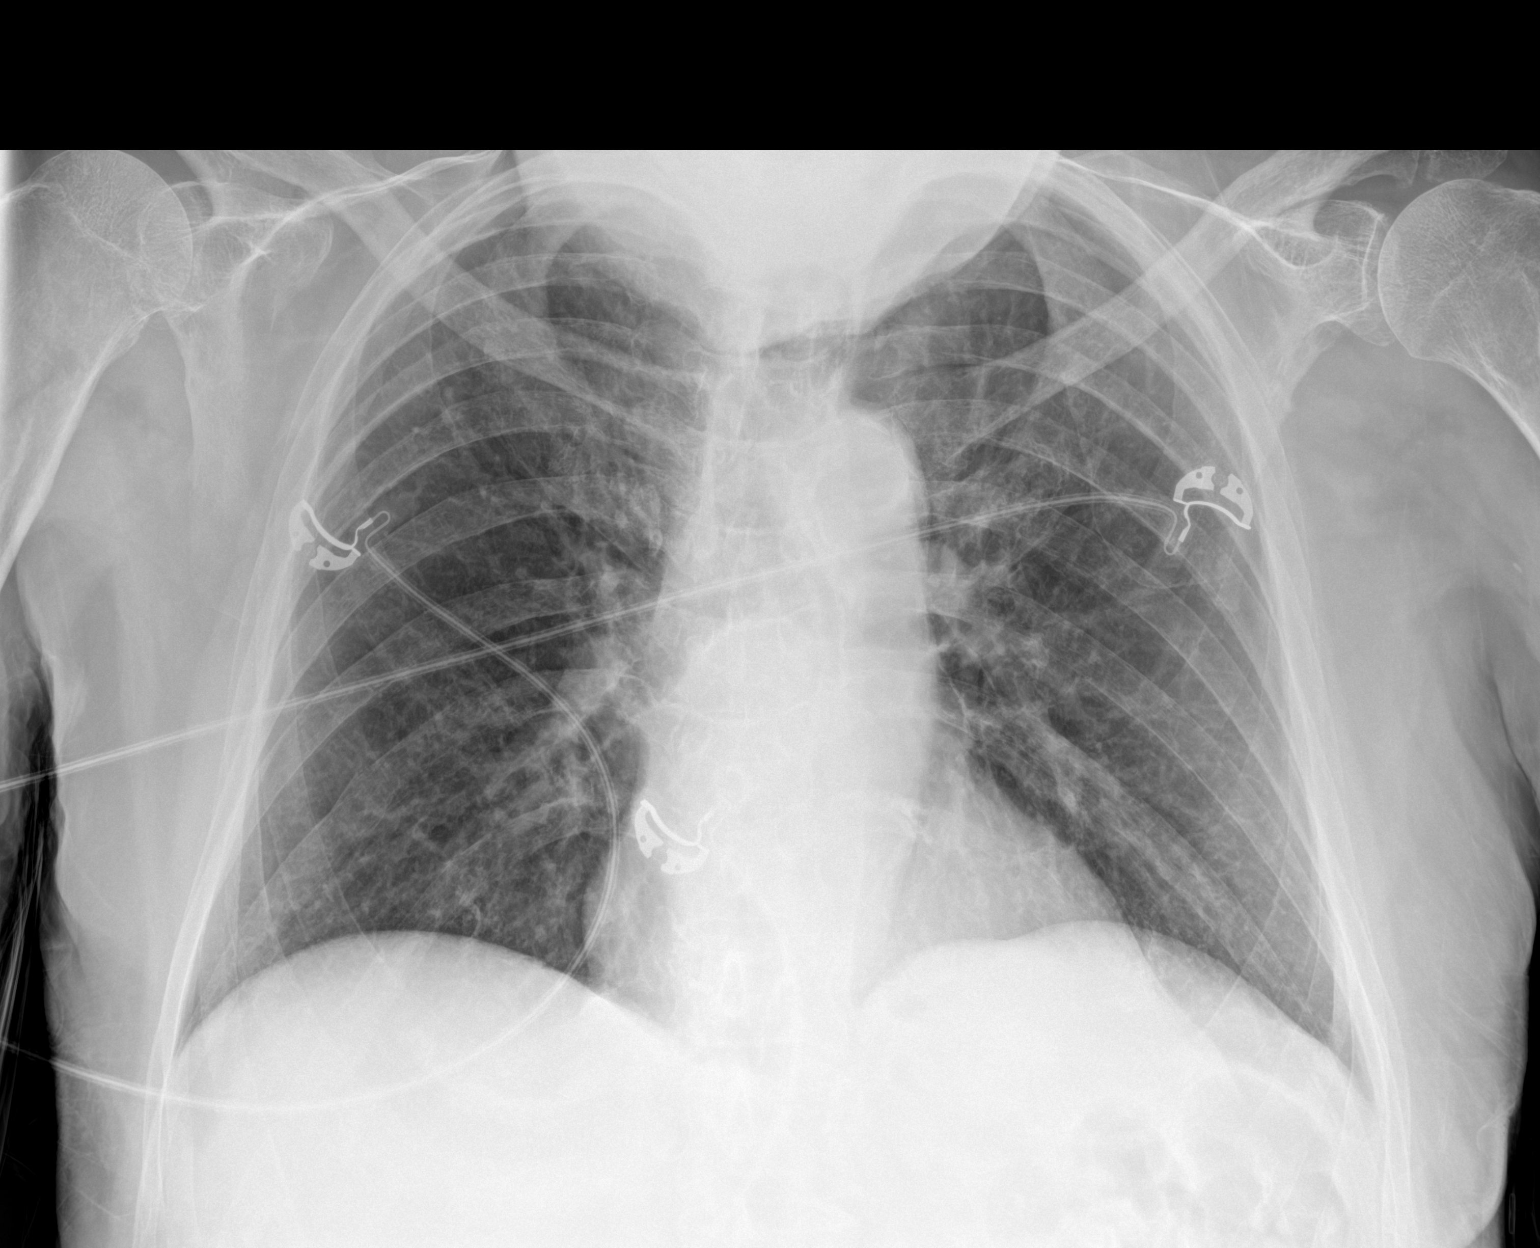

[1 of 1 positions shown; findings below may reference images not displayed]

FINDINGS: The heart size and mediastinal contours are within normal limits.
There is mild to moderate severity calcification of the aortic arch.
Mildly decreased lung volumes are noted. Both lungs are clear. The
visualized skeletal structures are unremarkable.
IMPRESSION: No active cardiopulmonary disease.

## 2022-07-22 ENCOUNTER — Telehealth: Payer: Self-pay

## 2022-07-22 NOTE — Telephone Encounter (Signed)
Copied from [place] [id number]. Topic: General - Inquiry >> Jul 22, 2022  1:57 PM Erskine Squibb wrote: Reason for CRM: The patients spouse called in stating the provider wanted the patient to have updated bmp blood work done within the next 2 weeks. Please assist patient further with scheduling

## 2022-07-23 NOTE — Telephone Encounter (Signed)
Lab orders were placed during last ov on 07/09/2022 with Mikey Kirschner, PA-C. Per her office visit note:   Advised to continue w/ hydration and will repeat bmp in 2 weeks from ED date    I returned Mrs. Streed's call and advised her that patient could come in to have labs done. Mrs. Dicioccio plans to bring patient by the lab tomorrow to have blood work done. Lab order printed and placed up front at [address].

## 2022-07-23 NOTE — Telephone Encounter (Signed)
Noted ty

## 2022-07-24 DIAGNOSIS — N179 Acute kidney failure, unspecified: Secondary | ICD-10-CM | POA: Diagnosis not present

## 2022-07-24 DIAGNOSIS — R739 Hyperglycemia, unspecified: Secondary | ICD-10-CM | POA: Diagnosis not present

## 2022-07-25 LAB — BASIC METABOLIC PANEL
BUN/Creatinine Ratio: 16 (ref 10–24)
BUN: 22 mg/dL (ref 10–36)
CO2: 24 mmol/L (ref 20–29)
Calcium: 9.2 mg/dL (ref 8.6–10.2)
Chloride: 106 mmol/L (ref 96–106)
Creatinine, Ser: 1.39 mg/dL — ABNORMAL HIGH (ref 0.76–1.27)
Glucose: 114 mg/dL — ABNORMAL HIGH (ref 70–99)
Potassium: 5.1 mmol/L (ref 3.5–5.2)
Sodium: 143 mmol/L (ref 134–144)
eGFR: 47 mL/min/{1.73_m2} — ABNORMAL LOW (ref >59)

## 2022-07-25 LAB — HEMOGLOBIN A1C
Est. average glucose Bld gHb Est-mCnc: 128 mg/dL
Hgb A1c MFr Bld: 6.1 % — ABNORMAL HIGH (ref 4.8–5.6)

## 2022-08-12 DIAGNOSIS — Z23 Encounter for immunization: Secondary | ICD-10-CM | POA: Diagnosis not present

## 2022-10-07 ENCOUNTER — Other Ambulatory Visit: Payer: Self-pay | Admitting: Family Medicine

## 2022-10-07 DIAGNOSIS — N481 Balanitis: Secondary | ICD-10-CM

## 2022-10-07 NOTE — Telephone Encounter (Signed)
Requested medication (s) are due for refill today - yes  Requested medication (s) are on the active medication list -yes  Future visit scheduled -yes  Last refill: 06/30/22 30g  Notes to clinic: non delegated Rx  Requested Prescriptions  Pending Prescriptions Disp Refills   ketoconazole (NIZORAL) 2 % cream [Pharmacy Med Name: KETOCONAZOLE 2% TOP CREAM GM] 30 g 0    Sig: Apply to glans of penis daily     Not Delegated - Over the Counter: OTC 2 Failed - 10/07/2022  1:41 PM      Failed - This refill cannot be delegated      Passed - Valid encounter within last 12 months    Recent Outpatient Visits           3 months ago AKI (acute kidney injury) ([place])   [place] Mikey Kirschner, PA-C   3 months ago [place], Kirstie Peri, MD   6 months ago Bradycardia   [facility] [name], MD   9 months ago Iron deficiency anemia due to chronic blood loss   [facility] [name], MD   9 months ago Encounter for [employer] annual wellness exam   [place], MD       Future Appointments             In 6 days [place], Kirstie Peri, MD [facility], [place]   In 3 weeks Caryn Section, Kirstie Peri, MD [facility], [facility]               Requested Prescriptions  Pending Prescriptions Disp Refills   ketoconazole (NIZORAL) 2 % cream [Pharmacy Med Name: KETOCONAZOLE 2% TOP CREAM GM] 30 g 0    Sig: Apply to glans of penis daily     Not Delegated - Over the Counter: OTC 2 Failed - 10/07/2022  1:41 PM      Failed - This refill cannot be delegated      Passed - Valid encounter within last 12 months    Recent Outpatient Visits           3 months ago AKI (acute kidney injury) [facility])   [place] Mikey Kirschner, PA-C   3 months ago [place], Donald E, MD   6 months ago Bradycardia   [facility] [name], MD   9 months ago Iron deficiency anemia due to chronic blood loss   [facility] [name], MD   9 months ago Encounter for [employer] annual wellness exam   [place], MD       Future Appointments             In 6 days Fisher, Kirstie Peri, MD [facility], [place]   In 3 weeks Caryn Section, Kirstie Peri, MD [facility], [place]

## 2022-10-13 ENCOUNTER — Ambulatory Visit (INDEPENDENT_AMBULATORY_CARE_PROVIDER_SITE_OTHER): Payer: Medicare Other | Admitting: Family Medicine

## 2022-10-13 ENCOUNTER — Ambulatory Visit
Admission: RE | Admit: 2022-10-13 | Discharge: 2022-10-13 | Disposition: A | Discharge status: Home / Self Care | Payer: Medicare Other | Source: Ambulatory Visit | Attending: Family Medicine | Admitting: Family Medicine

## 2022-10-13 ENCOUNTER — Ambulatory Visit
Admission: RE | Admit: 2022-10-13 | Discharge: 2022-10-13 | Disposition: A | Discharge status: Home / Self Care | Payer: Medicare Other | Attending: Family Medicine | Admitting: Family Medicine

## 2022-10-13 ENCOUNTER — Encounter: Payer: Self-pay | Admitting: Family Medicine

## 2022-10-13 VITALS — BP 158/56 | HR 66 | Temp 97.6°F | Wt 161.0 lb

## 2022-10-13 DIAGNOSIS — M85862 Other specified disorders of bone density and structure, left lower leg: Secondary | ICD-10-CM | POA: Diagnosis not present

## 2022-10-13 DIAGNOSIS — M79605 Pain in left leg: Secondary | ICD-10-CM | POA: Diagnosis not present

## 2022-10-13 DIAGNOSIS — M25562 Pain in left knee: Secondary | ICD-10-CM | POA: Diagnosis not present

## 2022-10-13 DIAGNOSIS — G8929 Other chronic pain: Secondary | ICD-10-CM | POA: Insufficient documentation

## 2022-10-13 NOTE — Progress Notes (Signed)
Established patient visit   Patient: Roger Cook   DOB: 11/01/1926   89 y.o. Male  MRN: [id number] Visit Date: 10/13/2022  Today's healthcare provider: Lelon Huh, MD   HPI   Pt stated--pain radiated to the left knee and ankle-3 months Last edited by Elta Guadeloupe, CMA on 10/13/2022  2:07 PM.      Subjective    HPI  Patient with dementia presents today with his wife complaining of persistent pain from his left knee down to his ankle. His wife states he has been having pain for the last 3 months. Is not aware of any injuries. Takes occasional Tylenol which provides brief improvement. He is able to bear weight and walk without being affected by pain, although he does walk with shuffle.   Medications: Outpatient Medications Prior to Visit  Medication Sig   Apoaequorin (PREVAGEN) 10 MG CAPS Take by mouth.   ARTIFICIAL TEAR SOLUTION OP Place 1 drop into both eyes 2 (two) times daily as needed (dry eyes).   aspirin 81 MG tablet Take 81 mg by mouth daily.    brimonidine (ALPHAGAN) 0.2 % ophthalmic solution Place 1 drop into the left eye 2 (two) times daily.   Calcium Carbonate-Vit D-Min (CALCIUM 1200 PO) Take by mouth.   donepezil (ARICEPT) 10 MG tablet TAKE 1 TABLET BY MOUTH AT BEDTIME   fluticasone (FLONASE) 50 MCG/ACT nasal spray Place 2 sprays into both nostrils daily as needed for allergies.   isosorbide mononitrate (IMDUR) 60 MG 24 hr tablet Take 1 tablet (60 mg total) by mouth daily.   ketoconazole (NIZORAL) 2 % cream APPLY TO GLANS OF PENIS DAILY   levothyroxine (SYNTHROID) 88 MCG tablet Take 1 tablet (88 mcg total) by mouth daily before breakfast.   memantine (NAMENDA) 10 MG tablet TAKE 1 TABLET BY MOUTH TWO TIMES DAILY   metoprolol succinate (TOPROL-XL) 25 MG 24 hr tablet Take 12.5 mg by mouth daily.   MULTIPLE VITAMIN PO Take 1 tablet by mouth daily.   NIFEdipine (PROCARDIA-XL/NIFEDICAL-XL) 30 MG 24 hr tablet Take 30 mg by mouth daily.   NON FORMULARY CPAP (Free  Text) - Historical Medication  As directed  Started 22-Sep-1994 Active   pravastatin (PRAVACHOL) 40 MG tablet TAKE 1 TABLET BY MOUTH DAILY   PREVIDENT 5000 DRY MOUTH 1.1 % GEL dental gel Place 1 application onto teeth 2 (two) times daily.   [DISCONTINUED] amoxicillin-clavulanate (AUGMENTIN) 875-125 MG tablet Take 1 tablet by mouth 2 (two) times daily.   No facility-administered medications prior to visit.       Objective    BP (!) 158/56   Pulse 66   Temp 97.6 F (36.4 C)   Wt 161 lb (73 kg)   SpO2 100%   BMI 25.99 kg/m    Physical Exam   Diffusely tender around left lower leg from knee to ankle, anteriorly and posteriorly. No cords. No masses. No other lesions appreciated. FROM of hips and ankles without pain.    Assessment & Plan     1. Chronic pain of left knee  - DG Knee Complete 4 Views Left; Future  2. Left leg pain  - DG Tibia/Fibula Left; Future      The entirety of the information documented in the History of Present Illness, Review of Systems and Physical Exam were personally obtained by me. Portions of this information were initially documented by the CMA and reviewed by me for thoroughness and accuracy.     Elenore Rota  Caryn Section, [place] [phone] (phone) [phone] (fax)  [place]

## 2022-10-17 ENCOUNTER — Other Ambulatory Visit: Payer: Self-pay | Admitting: Family Medicine

## 2022-10-17 DIAGNOSIS — I209 Angina pectoris, unspecified: Secondary | ICD-10-CM

## 2022-10-20 NOTE — Telephone Encounter (Signed)
Requested Prescriptions  Pending Prescriptions Disp Refills   isosorbide mononitrate (IMDUR) 60 MG 24 hr tablet [Pharmacy Med Name: ISOSORBIDE MONONITRATE ER 60 MG TAB] 90 tablet 3    Sig: TAKE ONE TABLET EVERY DAY     Cardiovascular:  Nitrates Failed - 10/17/2022  6:40 PM      Failed - Last BP in normal range    BP Readings from Last 1 Encounters:  10/13/22 (!) 158/56         Passed - Last Heart Rate in normal range    Pulse Readings from Last 1 Encounters:  10/13/22 66         Passed - Valid encounter within last 12 months    Recent Outpatient Visits           1 week ago Chronic pain of left knee   [facility] [name], MD   3 months ago AKI (acute kidney injury) [facility])   [place] Mikey Kirschner, PA-C   3 months ago [place], Donald E, MD   6 months ago Bradycardia   [facility] [name], MD   9 months ago Iron deficiency anemia due to chronic blood loss   [place], MD       Future Appointments             In 2 weeks [place], Kirstie Peri, MD [facility], [facility]

## 2022-10-28 ENCOUNTER — Other Ambulatory Visit: Payer: Self-pay | Admitting: Family Medicine

## 2022-11-03 ENCOUNTER — Ambulatory Visit: Payer: Medicare Other | Admitting: Family Medicine

## 2022-11-03 NOTE — Progress Notes (Deleted)
   Roger Cook,acting as a scribe for Lelon Huh, MD.,have documented all relevant documentation on the behalf of Lelon Huh, MD,as directed by  Lelon Huh, MD while in the presence of Lelon Huh, MD.     Established patient visit   Patient: Roger Cook   DOB: 10-14-1926   89 y.o. Male  MRN: [id number] Visit Date: 11/03/2022  Today's healthcare provider: Lelon Huh, MD   No chief complaint on file.  Subjective    HPI  Patient is a 89 year old male who presents for evaluation of leg pain.  Medications: Outpatient Medications Prior to Visit  Medication Sig   Apoaequorin (PREVAGEN) 10 MG CAPS Take by mouth.   ARTIFICIAL TEAR SOLUTION OP Place 1 drop into both eyes 2 (two) times daily as needed (dry eyes).   aspirin 81 MG tablet Take 81 mg by mouth daily.    brimonidine (ALPHAGAN) 0.2 % ophthalmic solution Place 1 drop into the left eye 2 (two) times daily.   Calcium Carbonate-Vit D-Min (CALCIUM 1200 PO) Take by mouth.   donepezil (ARICEPT) 10 MG tablet TAKE 1 TABLET BY MOUTH AT BEDTIME   fluticasone (FLONASE) 50 MCG/ACT nasal spray Place 2 sprays into both nostrils daily as needed for allergies.   isosorbide mononitrate (IMDUR) 60 MG 24 hr tablet TAKE ONE TABLET EVERY DAY   ketoconazole (NIZORAL) 2 % cream APPLY TO GLANS OF PENIS DAILY   levothyroxine (SYNTHROID) 88 MCG tablet TAKE 1 TABLET EVERY DAY ON EMPTY STOMACHWITH A GLASS OF WATER AT LEAST 30-60 MINBEFORE BREAKFAST   memantine (NAMENDA) 10 MG tablet TAKE 1 TABLET BY MOUTH TWO TIMES DAILY   metoprolol succinate (TOPROL-XL) 25 MG 24 hr tablet Take 12.5 mg by mouth daily.   MULTIPLE VITAMIN PO Take 1 tablet by mouth daily.   NIFEdipine (PROCARDIA-XL/NIFEDICAL-XL) 30 MG 24 hr tablet Take 30 mg by mouth daily.   NON FORMULARY CPAP (Free Text) - Historical Medication  As directed  Started 22-Sep-1994 Active   pravastatin (PRAVACHOL) 40 MG tablet TAKE 1 TABLET BY MOUTH DAILY   PREVIDENT 5000 DRY MOUTH 1.1 %  GEL dental gel Place 1 application onto teeth 2 (two) times daily.   No facility-administered medications prior to visit.    Review of Systems  {Labs  Heme  Chem  Endocrine  Serology  Results Review (optional):23779}   Objective    There were no vitals taken for this visit. {Show previous vital signs (optional):23777}  Physical Exam  ***  No results found for any visits on 11/03/22.  Assessment & Plan     ***  No follow-ups on file.      {provider attestation***:1}   Lelon Huh, MD  [place] [phone] (phone) [phone] (fax)  [place]

## 2022-11-13 ENCOUNTER — Inpatient Hospital Stay
Admission: EM | Admit: 2022-11-13 | Discharge: 2022-11-19 | DRG: 378 | Disposition: A | Discharge status: Home Health | Payer: Medicare Other | Attending: Internal Medicine | Admitting: Internal Medicine

## 2022-11-13 ENCOUNTER — Other Ambulatory Visit: Payer: Self-pay

## 2022-11-13 ENCOUNTER — Emergency Department: Payer: Medicare Other

## 2022-11-13 DIAGNOSIS — D62 Acute posthemorrhagic anemia: Secondary | ICD-10-CM | POA: Diagnosis present

## 2022-11-13 DIAGNOSIS — R531 Weakness: Secondary | ICD-10-CM | POA: Diagnosis not present

## 2022-11-13 DIAGNOSIS — A419 Sepsis, unspecified organism: Secondary | ICD-10-CM | POA: Diagnosis not present

## 2022-11-13 DIAGNOSIS — Z79899 Other long term (current) drug therapy: Secondary | ICD-10-CM | POA: Diagnosis not present

## 2022-11-13 DIAGNOSIS — Z801 Family history of malignant neoplasm of trachea, bronchus and lung: Secondary | ICD-10-CM

## 2022-11-13 DIAGNOSIS — Z882 Allergy status to sulfonamides status: Secondary | ICD-10-CM | POA: Diagnosis not present

## 2022-11-13 DIAGNOSIS — Z9849 Cataract extraction status, unspecified eye: Secondary | ICD-10-CM

## 2022-11-13 DIAGNOSIS — R7989 Other specified abnormal findings of blood chemistry: Secondary | ICD-10-CM | POA: Diagnosis present

## 2022-11-13 DIAGNOSIS — R0689 Other abnormalities of breathing: Secondary | ICD-10-CM | POA: Diagnosis not present

## 2022-11-13 DIAGNOSIS — W010XXA Fall on same level from slipping, tripping and stumbling without subsequent striking against object, initial encounter: Secondary | ICD-10-CM | POA: Diagnosis present

## 2022-11-13 DIAGNOSIS — Z888 Allergy status to other drugs, medicaments and biological substances status: Secondary | ICD-10-CM

## 2022-11-13 DIAGNOSIS — G4730 Sleep apnea, unspecified: Secondary | ICD-10-CM | POA: Diagnosis present

## 2022-11-13 DIAGNOSIS — E538 Deficiency of other specified B group vitamins: Secondary | ICD-10-CM | POA: Diagnosis not present

## 2022-11-13 DIAGNOSIS — I2510 Atherosclerotic heart disease of native coronary artery without angina pectoris: Secondary | ICD-10-CM | POA: Diagnosis present

## 2022-11-13 DIAGNOSIS — I700 Atherosclerosis of aorta: Secondary | ICD-10-CM | POA: Diagnosis present

## 2022-11-13 DIAGNOSIS — Z1152 Encounter for screening for COVID-19: Secondary | ICD-10-CM

## 2022-11-13 DIAGNOSIS — I959 Hypotension, unspecified: Secondary | ICD-10-CM | POA: Diagnosis not present

## 2022-11-13 DIAGNOSIS — G4733 Obstructive sleep apnea (adult) (pediatric): Secondary | ICD-10-CM | POA: Diagnosis not present

## 2022-11-13 DIAGNOSIS — M25562 Pain in left knee: Secondary | ICD-10-CM | POA: Diagnosis not present

## 2022-11-13 DIAGNOSIS — E039 Hypothyroidism, unspecified: Secondary | ICD-10-CM | POA: Diagnosis present

## 2022-11-13 DIAGNOSIS — Z8546 Personal history of malignant neoplasm of prostate: Secondary | ICD-10-CM

## 2022-11-13 DIAGNOSIS — Z9079 Acquired absence of other genital organ(s): Secondary | ICD-10-CM

## 2022-11-13 DIAGNOSIS — K219 Gastro-esophageal reflux disease without esophagitis: Secondary | ICD-10-CM | POA: Diagnosis present

## 2022-11-13 DIAGNOSIS — I10 Essential (primary) hypertension: Secondary | ICD-10-CM | POA: Diagnosis present

## 2022-11-13 DIAGNOSIS — Z7982 Long term (current) use of aspirin: Secondary | ICD-10-CM

## 2022-11-13 DIAGNOSIS — Z79890 Hormone replacement therapy (postmenopausal): Secondary | ICD-10-CM

## 2022-11-13 DIAGNOSIS — Z8249 Family history of ischemic heart disease and other diseases of the circulatory system: Secondary | ICD-10-CM

## 2022-11-13 DIAGNOSIS — Z8673 Personal history of transient ischemic attack (TIA), and cerebral infarction without residual deficits: Secondary | ICD-10-CM | POA: Diagnosis not present

## 2022-11-13 DIAGNOSIS — Y92003 Bedroom of unspecified non-institutional (private) residence as the place of occurrence of the external cause: Secondary | ICD-10-CM

## 2022-11-13 DIAGNOSIS — R7881 Bacteremia: Secondary | ICD-10-CM | POA: Diagnosis not present

## 2022-11-13 DIAGNOSIS — D509 Iron deficiency anemia, unspecified: Secondary | ICD-10-CM | POA: Diagnosis not present

## 2022-11-13 DIAGNOSIS — Z87891 Personal history of nicotine dependence: Secondary | ICD-10-CM

## 2022-11-13 DIAGNOSIS — G510 Bell's palsy: Secondary | ICD-10-CM | POA: Diagnosis present

## 2022-11-13 DIAGNOSIS — E785 Hyperlipidemia, unspecified: Secondary | ICD-10-CM | POA: Diagnosis not present

## 2022-11-13 DIAGNOSIS — K922 Gastrointestinal hemorrhage, unspecified: Principal | ICD-10-CM | POA: Diagnosis present

## 2022-11-13 DIAGNOSIS — R4182 Altered mental status, unspecified: Secondary | ICD-10-CM | POA: Diagnosis not present

## 2022-11-13 DIAGNOSIS — F0390 Unspecified dementia without behavioral disturbance: Secondary | ICD-10-CM | POA: Diagnosis present

## 2022-11-13 DIAGNOSIS — Z960 Presence of urogenital implants: Secondary | ICD-10-CM | POA: Diagnosis present

## 2022-11-13 DIAGNOSIS — Z923 Personal history of irradiation: Secondary | ICD-10-CM

## 2022-11-13 DIAGNOSIS — K5900 Constipation, unspecified: Secondary | ICD-10-CM | POA: Diagnosis present

## 2022-11-13 DIAGNOSIS — D649 Anemia, unspecified: Secondary | ICD-10-CM | POA: Diagnosis present

## 2022-11-13 DIAGNOSIS — Z7401 Bed confinement status: Secondary | ICD-10-CM | POA: Diagnosis not present

## 2022-11-13 DIAGNOSIS — W19XXXA Unspecified fall, initial encounter: Principal | ICD-10-CM

## 2022-11-13 DIAGNOSIS — F03C0 Unspecified dementia, severe, without behavioral disturbance, psychotic disturbance, mood disturbance, and anxiety: Secondary | ICD-10-CM | POA: Diagnosis present

## 2022-11-13 DIAGNOSIS — R404 Transient alteration of awareness: Secondary | ICD-10-CM | POA: Diagnosis not present

## 2022-11-13 DIAGNOSIS — S80212A Abrasion, left knee, initial encounter: Secondary | ICD-10-CM | POA: Diagnosis not present

## 2022-11-13 DIAGNOSIS — B9689 Other specified bacterial agents as the cause of diseases classified elsewhere: Secondary | ICD-10-CM | POA: Diagnosis present

## 2022-11-13 DIAGNOSIS — R410 Disorientation, unspecified: Secondary | ICD-10-CM | POA: Diagnosis not present

## 2022-11-13 LAB — COMPREHENSIVE METABOLIC PANEL WITH GFR
ALT: 15 U/L (ref 0–44)
AST: 23 U/L (ref 15–41)
Albumin: 3.3 g/dL — ABNORMAL LOW (ref 3.5–5.0)
Alkaline Phosphatase: 68 U/L (ref 38–126)
Anion gap: 11 (ref 5–15)
BUN: 25 mg/dL — ABNORMAL HIGH (ref 8–23)
CO2: 20 mmol/L — ABNORMAL LOW (ref 22–32)
Calcium: 8.6 mg/dL — ABNORMAL LOW (ref 8.9–10.3)
Chloride: 107 mmol/L (ref 98–111)
Creatinine, Ser: 1.43 mg/dL — ABNORMAL HIGH (ref 0.61–1.24)
GFR, Estimated: 46 mL/min — ABNORMAL LOW
Glucose, Bld: 182 mg/dL — ABNORMAL HIGH (ref 70–99)
Potassium: 4.7 mmol/L (ref 3.5–5.1)
Sodium: 138 mmol/L (ref 135–145)
Total Bilirubin: 0.6 mg/dL (ref 0.3–1.2)
Total Protein: 6.6 g/dL (ref 6.5–8.1)

## 2022-11-13 LAB — CBC WITH DIFFERENTIAL/PLATELET
Abs Immature Granulocytes: 0.02 K/uL (ref 0.00–0.07)
Basophils Absolute: 0.1 K/uL (ref 0.0–0.1)
Basophils Relative: 1 %
Eosinophils Absolute: 0.1 K/uL (ref 0.0–0.5)
Eosinophils Relative: 2 %
HCT: 20.9 % — ABNORMAL LOW (ref 39.0–52.0)
Hemoglobin: 6 g/dL — ABNORMAL LOW (ref 13.0–17.0)
Immature Granulocytes: 0 %
Lymphocytes Relative: 18 %
Lymphs Abs: 1.3 K/uL (ref 0.7–4.0)
MCH: 23.4 pg — ABNORMAL LOW (ref 26.0–34.0)
MCHC: 28.7 g/dL — ABNORMAL LOW (ref 30.0–36.0)
MCV: 81.6 fL (ref 80.0–100.0)
Monocytes Absolute: 0.7 K/uL (ref 0.1–1.0)
Monocytes Relative: 11 %
Neutro Abs: 4.8 K/uL (ref 1.7–7.7)
Neutrophils Relative %: 68 %
Platelets: 278 K/uL (ref 150–400)
RBC: 2.56 MIL/uL — ABNORMAL LOW (ref 4.22–5.81)
RDW: 15.5 % (ref 11.5–15.5)
WBC: 7.1 K/uL (ref 4.0–10.5)
nRBC: 0 % (ref 0.0–0.2)

## 2022-11-13 LAB — PROTIME-INR
INR: 1.2 (ref 0.8–1.2)
Prothrombin Time: 14.9 s (ref 11.4–15.2)

## 2022-11-13 LAB — RETICULOCYTES
Immature Retic Fract: 19.9 % — ABNORMAL HIGH (ref 2.3–15.9)
RBC.: 2.89 MIL/uL — ABNORMAL LOW (ref 4.22–5.81)
Retic Count, Absolute: 35.5 10*3/uL (ref 19.0–186.0)
Retic Ct Pct: 1.2 % (ref 0.4–3.1)

## 2022-11-13 LAB — IRON AND TIBC
Iron: 78 ug/dL (ref 45–182)
Saturation Ratios: 20 % (ref 17.9–39.5)
TIBC: 400 ug/dL (ref 250–450)
UIBC: 322 ug/dL

## 2022-11-13 LAB — RESP PANEL BY RT-PCR (RSV, FLU A&B, COVID)  RVPGX2
Influenza A by PCR: NEGATIVE
Influenza B by PCR: NEGATIVE
Resp Syncytial Virus by PCR: NEGATIVE
SARS Coronavirus 2 by RT PCR: NEGATIVE

## 2022-11-13 LAB — FOLATE: Folate: 13.5 ng/mL (ref >5.9)

## 2022-11-13 LAB — LACTIC ACID, PLASMA
Lactic Acid, Venous: 3.9 mmol/L (ref 0.5–1.9)
Lactic Acid, Venous: 2.4 mmol/L (ref 0.5–1.9)

## 2022-11-13 LAB — APTT: aPTT: 27 s (ref 24–36)

## 2022-11-13 LAB — FERRITIN: Ferritin: 5 ng/mL — ABNORMAL LOW (ref 24–336)

## 2022-11-13 LAB — PREPARE RBC (CROSSMATCH)

## 2022-11-13 MED ORDER — PANTOPRAZOLE INFUSION (NEW) - SIMPLE MED
8.0000 mg/h | INTRAVENOUS | Status: AC
  Administered 2022-11-13 – 2022-11-16 (×7): 8 mg/h via INTRAVENOUS
  Filled 2022-11-13 (×8): qty 100

## 2022-11-13 MED ORDER — PANTOPRAZOLE SODIUM 40 MG IV SOLR
40.0000 mg | Freq: Once | INTRAVENOUS | Status: AC
  Administered 2022-11-13: 40 mg via INTRAVENOUS
  Filled 2022-11-13: qty 10

## 2022-11-13 MED ORDER — LEVOTHYROXINE SODIUM 88 MCG PO TABS
88.0000 ug | ORAL_TABLET | Freq: Every day | ORAL | Status: DC
  Administered 2022-11-16 – 2022-11-19 (×4): 88 ug via ORAL
  Filled 2022-11-13 (×6): qty 1

## 2022-11-13 MED ORDER — SENNOSIDES-DOCUSATE SODIUM 8.6-50 MG PO TABS: 1.0000 | ORAL_TABLET | Freq: Every evening | ORAL | Status: DC | PRN

## 2022-11-13 MED ORDER — LACTATED RINGERS IV SOLN: INTRAVENOUS | Status: DC

## 2022-11-13 MED ORDER — NIFEDIPINE ER OSMOTIC RELEASE 30 MG PO TB24
30.0000 mg | ORAL_TABLET | Freq: Every day | ORAL | Status: DC
  Administered 2022-11-14 – 2022-11-19 (×5): 30 mg via ORAL
  Filled 2022-11-13 (×8): qty 1

## 2022-11-13 MED ORDER — ONDANSETRON HCL 4 MG PO TABS: 4.0000 mg | ORAL_TABLET | Freq: Four times a day (QID) | ORAL | Status: AC | PRN

## 2022-11-13 MED ORDER — BRIMONIDINE TARTRATE 0.2 % OP SOLN
1.0000 [drp] | Freq: Two times a day (BID) | OPHTHALMIC | Status: DC
  Administered 2022-11-14 – 2022-11-19 (×11): 1 [drp] via OPHTHALMIC
  Filled 2022-11-13: qty 5

## 2022-11-13 MED ORDER — MEMANTINE HCL 5 MG PO TABS
10.0000 mg | ORAL_TABLET | Freq: Two times a day (BID) | ORAL | Status: DC
  Administered 2022-11-14 – 2022-11-19 (×9): 10 mg via ORAL
  Filled 2022-11-13 (×11): qty 2

## 2022-11-13 MED ORDER — FLUTICASONE PROPIONATE 50 MCG/ACT NA SUSP: 2.0000 | Freq: Every day | NASAL | Status: DC | PRN

## 2022-11-13 MED ORDER — PRAVASTATIN SODIUM 40 MG PO TABS
40.0000 mg | ORAL_TABLET | Freq: Every day | ORAL | Status: DC
  Administered 2022-11-14 – 2022-11-18 (×5): 40 mg via ORAL
  Filled 2022-11-13 (×5): qty 1

## 2022-11-13 MED ORDER — ACETAMINOPHEN 325 MG PO TABS: 650.0000 mg | ORAL_TABLET | Freq: Four times a day (QID) | ORAL | Status: AC | PRN

## 2022-11-13 MED ORDER — PANTOPRAZOLE SODIUM 40 MG IV SOLR
40.0000 mg | Freq: Two times a day (BID) | INTRAVENOUS | Status: DC
  Administered 2022-11-17 – 2022-11-19 (×5): 40 mg via INTRAVENOUS
  Filled 2022-11-13 (×5): qty 10

## 2022-11-13 MED ORDER — SODIUM CHLORIDE 0.9 % IV SOLN
2.0000 g | INTRAVENOUS | Status: DC
  Administered 2022-11-13 – 2022-11-15 (×3): 2 g via INTRAVENOUS
  Filled 2022-11-13 (×3): qty 20

## 2022-11-13 MED ORDER — ONDANSETRON HCL 4 MG/2ML IJ SOLN: 4.0000 mg | Freq: Four times a day (QID) | INTRAMUSCULAR | Status: AC | PRN

## 2022-11-13 MED ORDER — ACETAMINOPHEN 650 MG RE SUPP: 650.0000 mg | Freq: Four times a day (QID) | RECTAL | Status: AC | PRN

## 2022-11-13 MED ORDER — SODIUM CHLORIDE 0.9 % IV SOLN: 10.0000 mL/h | Freq: Once | INTRAVENOUS | Status: DC

## 2022-11-13 MED ORDER — DONEPEZIL HCL 5 MG PO TABS
10.0000 mg | ORAL_TABLET | Freq: Every day | ORAL | Status: DC
  Administered 2022-11-15 – 2022-11-18 (×4): 10 mg via ORAL
  Filled 2022-11-13 (×6): qty 2

## 2022-11-13 MED ORDER — METOPROLOL SUCCINATE ER 25 MG PO TB24
12.5000 mg | ORAL_TABLET | Freq: Every day | ORAL | Status: DC
  Administered 2022-11-14 – 2022-11-19 (×5): 12.5 mg via ORAL
  Filled 2022-11-13 (×5): qty 1

## 2022-11-13 MED ORDER — ISOSORBIDE MONONITRATE ER 60 MG PO TB24
60.0000 mg | ORAL_TABLET | Freq: Every day | ORAL | Status: DC
  Administered 2022-11-14 – 2022-11-19 (×5): 60 mg via ORAL
  Filled 2022-11-13 (×5): qty 1

## 2022-11-13 NOTE — Sepsis Progress Note (Signed)
Sepsis protocol monitored by eLink ?

## 2022-11-13 NOTE — Assessment & Plan Note (Addendum)
No behavioral disturbances reported. - Continue Namenda and Aricept

## 2022-11-13 NOTE — Plan of Care (Signed)

## 2022-11-13 NOTE — Progress Notes (Signed)
CODE SEPSIS - PHARMACY COMMUNICATION  **Broad Spectrum Antibiotics should be administered within 1 hour of Sepsis diagnosis**  Time Code Sepsis Called/Page Received: 1426  Antibiotics Ordered: ceftriaxone  Time of 1st antibiotic administration: 1444  Additional action taken by pharmacy: N/A     Alison Murray ,PharmD Clinical Pharmacist  11/13/2022  3:34 PM

## 2022-11-13 NOTE — Assessment & Plan Note (Addendum)
Most likely due to GI bleeding and severe anemia. Unlikely infectious etiology. Lactate trend: 3.9 >> 2.4 Trend lactate until normalized.

## 2022-11-13 NOTE — Assessment & Plan Note (Addendum)
See Symptomatic Anemia

## 2022-11-13 NOTE — Assessment & Plan Note (Addendum)
-   CPAP nightly ordered 

## 2022-11-13 NOTE — ED Triage Notes (Signed)
Wife called EMS reporting patient had unwitnessed fall at home; complaining of pain to left leg. Patient disoriented to time at baseline.

## 2022-11-13 NOTE — Assessment & Plan Note (Addendum)
Severe acute on chronic anemia Suspect secondary to upper GI bleed. Hemodynamically stable Initial Hbg 6.0.  Transfused 1 unit pRBC's on admission. Repeat Hbg was 6.5.  2nd unit pRBC's transfused. Hbg this AM stable 7.9 >> 8.3 this afternoon. Anemia panel - no iron deficiency, normal folate, very mildly low B12 176 (ref range 180-914) --GI consulted  --EGD this AM was unremarkable --Trend Hbg and transfuse if < 8.0 given CAD hx - Protonix gtt.

## 2022-11-13 NOTE — Progress Notes (Signed)
Please update patient that this room is a shared room with 1 bathroom

## 2022-11-13 NOTE — Hospital Course (Addendum)
[name] is a 89 year old male with history of dementia, hypertension, hyperlipidemia, hypothyroid, history of TIA, history of prostate cancer, Bell's palsy, who presented to the ED on 11/13/2022 for evaluation after having an unwitnessed fall at home.  Initial vitals in the ED showed temperature of 97.4, respiration rate of 24, heart rate of 71, blood pressure 161/53, SpO2 of 100% on room air. Initial labs most notable for anemia with Hbg 6.0, Cr 1.43, Lactic acid 3.9.  Treated in the ED with Protonix 40 mg IV one-time dose, ceftriaxone 2 g IV, LR infusion.  Typed and screened and 1 unit of PRBC were ordered for transfusion.  Admitted to hospitalist service with gastroenterology consulted.  GI plans for EGD once Hbg > 7, hopefully tomorrow 2/24.

## 2022-11-13 NOTE — ED Provider Notes (Signed)
[facility] Provider Note   Event Date/Time   First MD Initiated Contact with Patient 11/13/22 1326     (approximate) History  Fall  HPI Roger Cook is a 89 y.o. male with a past medical history of severe dementia, hypertension, and hypothyroidism who presents after an unwitnessed fall resulting in left knee pain.  Patient's caregiver at bedside states that he has been worsening in terms of mental status over the last few weeks with significant worsening over the last few days.  Caregiver also states the patient is disoriented at this time to his baseline ROS: Unable to assess   Physical Exam  Triage Vital Signs: ED Triage Vitals [11/13/22 1351]  Enc Vitals Group     BP (!) 161/53     Pulse Rate 71     Resp (!) 24     Temp (!) 97.4 F (36.3 C)     Temp Source Oral     SpO2 100 %     Weight      Height      Head Circumference      Peak Flow      Pain Score      Pain Loc      Pain Edu?      Excl. in [place]?    Most recent vital signs: Vitals:   11/13/22 1500 11/13/22 1530  BP: (!) 146/68 (!) 136/38  Pulse: 80 77  Resp: (!) 32 (!) 26  Temp:    SpO2: 100% 100%   General: Awake, oriented x2. CV:  Good peripheral perfusion.  Resp:  Normal effort.  Abd:  No distention.  Other:  Elderly Caucasian male laying in bed in no acute distress.  There is an area of abrasion overlying the left knee ED Results / Procedures / Treatments  Labs (all labs ordered are listed, but only abnormal results are displayed) Labs Reviewed  LACTIC ACID, PLASMA - Abnormal; Notable for the following components:      Result Value   Lactic Acid, Venous 3.9 (*)    All other components within normal limits  COMPREHENSIVE METABOLIC PANEL - Abnormal; Notable for the following components:   CO2 20 (*)    Glucose, Bld 182 (*)    BUN 25 (*)    Creatinine, Ser 1.43 (*)    Calcium 8.6 (*)    Albumin 3.3 (*)    GFR, Estimated 46 (*)    All other components within normal limits   CBC WITH DIFFERENTIAL/PLATELET - Abnormal; Notable for the following components:   RBC 2.56 (*)    Hemoglobin 6.0 (*)    HCT 20.9 (*)    MCH 23.4 (*)    MCHC 28.7 (*)    All other components within normal limits  RESP PANEL BY RT-PCR (RSV, FLU A&B, COVID)  RVPGX2  CULTURE, BLOOD (ROUTINE X 2)  CULTURE, BLOOD (ROUTINE X 2)  PROTIME-INR  APTT  LACTIC ACID, PLASMA  URINALYSIS, W/ REFLEX TO CULTURE (INFECTION SUSPECTED)  TYPE AND SCREEN  PREPARE RBC (CROSSMATCH)   EKG ED ECG REPORT I, Naaman Plummer, the attending physician, personally viewed and interpreted this ECG. Date: 11/13/2022 EKG Time: 1356 Rate: 72 Rhythm: normal sinus rhythm QRS Axis: normal Intervals: normal ST/T Wave abnormalities: normal Narrative Interpretation: no evidence of acute ischemia RADIOLOGY ED MD interpretation: X-ray of the left knee interpreted independently by me does not show any evidence of acute abnormalities  One-view portable chest x-ray interpreted independently by me and  shows diffusely increased interstitial markings bilaterally  CT of the head without contrast interpreted by me shows no evidence of acute abnormalities including no intracerebral hemorrhage, obvious masses, or significant edema -Agree with radiology assessment Official radiology report(s): DG Knee Complete 4 Views Left  Result Date: 11/13/2022 CLINICAL DATA:  Left knee pain, fall EXAM: LEFT KNEE - COMPLETE 4+ VIEW COMPARISON:  10/13/2022 FINDINGS: No evidence of fracture, dislocation, or joint effusion. Mild medial compartment joint space narrowing. Soft tissues are unremarkable. IMPRESSION: Negative. Electronically Signed   By: Davina Poke D.O.   On: 11/13/2022 15:35   DG Chest Port 1 View  Result Date: 11/13/2022 CLINICAL DATA:  Sepsis EXAM: PORTABLE CHEST 1 VIEW COMPARISON:  03/10/2022 FINDINGS: The heart size and mediastinal contours are within normal limits. Aortic atherosclerosis. Diffusely increased interstitial  markings bilaterally. No focal consolidation. No pleural effusion or pneumothorax. The visualized skeletal structures are unremarkable. IMPRESSION: Diffusely increased interstitial markings bilaterally, which may reflect bronchitic lung changes, edema, or developing atypical/viral infection. Electronically Signed   By: Davina Poke D.O.   On: 11/13/2022 15:34   CT Head Wo Contrast  Result Date: 11/13/2022 CLINICAL DATA:  Mental status change, unknown cause EXAM: CT HEAD WITHOUT CONTRAST TECHNIQUE: Contiguous axial images were obtained from the base of the skull through the vertex without intravenous contrast. RADIATION DOSE REDUCTION: This exam was performed according to the departmental dose-optimization program which includes automated exposure control, adjustment of the mA and/or kV according to patient size and/or use of iterative reconstruction technique. COMPARISON:  07/04/2022 FINDINGS: Brain: No evidence of acute infarction, hemorrhage, hydrocephalus, extra-axial collection or mass lesion/mass effect. Patchy low-density changes within the periventricular and subcortical white matter most compatible with chronic microvascular ischemic change. Moderate diffuse cerebral volume loss. Vascular: Atherosclerotic calcifications involving the large vessels of the skull base. No unexpected hyperdense vessel. Skull: Normal. Negative for fracture or focal lesion. Sinuses/Orbits: Chronic opacification of the frontal sinuses. Prior sinus surgery. Mastoid air cells are clear. Other: None. IMPRESSION: 1. No acute intracranial findings. 2. Chronic microvascular ischemic change and cerebral volume loss. Electronically Signed   By: Davina Poke D.O.   On: 11/13/2022 15:26   PROCEDURES: Critical Care performed: Yes, see critical care procedure note(s) .1-3 Lead EKG Interpretation  Performed by: Naaman Plummer, MD Authorized by: Naaman Plummer, MD     Interpretation: abnormal     ECG rate:  71   ECG rate  assessment: normal     Rhythm: atrial fibrillation     Ectopy: none     Conduction: normal   CRITICAL CARE Performed by: Naaman Plummer  Total critical care time: 31 minutes  Critical care time was exclusive of separately billable procedures and treating other patients.  Critical care was necessary to treat or prevent imminent or life-threatening deterioration.  Critical care was time spent personally by me on the following activities: development of treatment plan with patient and/or surrogate as well as nursing, discussions with consultants, evaluation of patient's response to treatment, examination of patient, obtaining history from patient or surrogate, ordering and performing treatments and interventions, ordering and review of laboratory studies, ordering and review of radiographic studies, pulse oximetry and re-evaluation of patient's condition.  MEDICATIONS ORDERED IN ED: Medications  lactated ringers infusion ( Intravenous New Bag/Given 11/13/22 1444)  cefTRIAXone (ROCEPHIN) 2 g in sodium chloride 0.9 % 100 mL IVPB (0 g Intravenous Stopped 11/13/22 1508)  0.9 %  sodium chloride infusion (has no administration in time range)  pantoprazole (PROTONIX) injection 40 mg (40 mg Intravenous Given 11/13/22 1532)   IMPRESSION / MDM / ASSESSMENT AND PLAN / ED COURSE  I reviewed the triage vital signs and the nursing notes.                             The patient is on the cardiac monitor to evaluate for evidence of arrhythmia and/or significant heart rate changes. Patient's presentation is most consistent with acute presentation with potential threat to life or bodily function. 89 year old patient with severe dementia presents for an unwitnessed fall overnight last night, increasing chills, generalized weakness + black stool per rectum Given history and exam patients presentation most consistent with upper GI bleed possibly secondary to peptic ulcer disease or variceal bleeding. I have low  suspicion for aortoenteric fistula, ENT bleeding mimic, [place], Pulmonary bleeding mimic.  Workup: CBC, BMP, LFTs, Lipase, PT/INR, Type and Screen  Interventions: Analgesia and antiemetic medications PRN Protonix 63m IVP PRBC transfusion  Findings: Hb: 6.0  Disposition: Admit for close monitoring.   FINAL CLINICAL IMPRESSION(S) / ED DIAGNOSES   Final diagnoses:  Fall, initial encounter  Symptomatic anemia   Rx / DC Orders   ED Discharge Orders     None      Note:  This document was prepared using Dragon voice recognition software and may include unintentional dictation errors.   BNaaman Plummer MD 11/13/22 [phone]

## 2022-11-13 NOTE — Assessment & Plan Note (Addendum)
Continue Imdur, Toprol-XL, nifedipine

## 2022-11-13 NOTE — Assessment & Plan Note (Addendum)
Continue levothyroxine 

## 2022-11-13 NOTE — H&P (Signed)
History and Physical   Roger Cook [id number] DOB: 1927/09/11 DOA: 11/13/2022  PCP: [name], MD  Patient coming from: Home via EMS  I have personally briefly reviewed patient's old medical records in [place].  Chief Concern: Unwitnessed fall, weakness  HPI: Roger Cook is a 89 year old male with history of dementia, hypertension, hyperlipidemia, hypothyroid, history of TIA, history of prostate cancer, Bell's palsy, who presents to the emergency department for chief concerns of unwitnessed fall at home.  Initial vitals in the ED showed temperature of 97.4, respiration rate of 24, heart rate of 71, blood pressure 161/53, SpO2 of 100% on room air.  Serum sodium is 138, potassium 4.7, chloride of 107, bicarb 20, BUN of 25, serum creatinine of 1.43, GFR 46, nonfasting blood glucose 182, WBC 7.1, hemoglobin 6.0, platelets of 278.  Lactic acid was 3.9.  COVID/influenza A/influenza B/RSV PCR were negative.  UA has been ordered and pending. Blood cultures x2 are ordered and are in process.  ED treatment: Protonix 40 mg IV one-time dose, ceftriaxone 2 g IV one-time dose, LR infusion, type and screen, 1 unit of PRBC have been ordered for transfusion. ----------------------- At bedside, he is able to tell me his name, age of 28 and corrected to 46. He was able to tell me the month is February and he was not able to tell me the current year. He was able to tell me the current location, hospital.  Patient does not know why he is in the hospital.  He states he would like to go home.  He states that he thinks he slipped and fell out in the yard.  Spouse at bedside corrected this and states that he slipped and fell in his room.  He denies chest pain, shortness of breath, abdominal pain, he states he has no complaints.  Social history: He lives with his wife. He denies tobacco. He denies etoh, recreational drug use. 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  ROS: Unable to accurately complete as patient has moderate to advanced dementia  ED Course: Discussed with emergency medicine provider, patient requiring hospitalization for chief concerns of symptomatic anemia.  Assessment/Plan  Principal Problem:   Symptomatic anemia Active Problems:   Arteriosclerosis of coronary artery   Essential hypertension   Severe obstructive sleep apnea   Hypothyroidism   Constipation   Dementia (HCC)   Severe anemia   Elevated lactic acid level   Assessment and Plan:  * Symptomatic anemia Severe acute on chronic anemia - Suspect secondary to upper GI bleed - EDP ordered Protonix 40 mg IV one-time dose - I ordered additional Protonix 40 mg IV one-time dose to equal 80 mg IV on admission - Protonix gtt. - Check anemia panel - Agree with 1 unit PRBC, goal hemoglobin is greater than 8 - Recheck hemoglobin/hematocrit at 2000-hour on admission - Consultation placed for gastroenterology, Dr. Vicente Males via secure chat and Epic order - Admit to progressive cardiac, inpatient  Elevated lactic acid level - Low clinical suspicion for infectious etiology at this time - No clinical signs and or endorsement of symptoms concerning for infectious etiology, no antibiotic indicated at this time - I suspect this is reactive secondary to upper GI bleed/acute severe anemia  Severe anemia Severe acute on chronic anemia - Baseline hemoglobin over the last year has been 8.3-10.2 - 1 unit PRBC ordered - Goal hemoglobin level is greater than 8 - H&H recheck at 2000 hours on  day of admission and CBC in the a.m.  Dementia ([place]) - Resumed home memantine 10 mg p.o. twice daily, donepezil 10 mg nightly  Hypothyroidism - Levothyroxine 88 mcg daily before breakfast resumed  Severe obstructive sleep apnea - CPAP nightly ordered  Essential hypertension - Home isosorbide mononitrate 60 mg daily, metoprolol succinate 12.5 mg daily, nifedipine 30 mg  daily resumed for 11/14/2022  Chart reviewed.   DVT prophylaxis: TED hose Code Status: full code, per spouse at bedside Diet: clear liquids; n.p.o. after midnight Family Communication: Spouse was updated, Pamala Hurry, at bedside Disposition Plan: Pending clinical course Consults called: Gastroenterology Admission status: Progressive cardiac, inpatient  Past Medical History:  Diagnosis Date   Anemia    Aortic atherosclerosis ([place])    Bell palsy    Bilateral renal cysts    Bladder tumor    CAD (coronary artery disease)    DOE (dyspnea on exertion)    GERD (gastroesophageal reflux disease)    History of angina    History of penile implant    History of prostate cancer    Hyperlipidemia    Hypertension    Hypothyroidism    Lipoma    MRSA bacteremia 02/2011   Nephrolithiasis    OSA on CPAP    Pneumonia    as a teenager   Past Surgical History:  Procedure Laterality Date   APPENDECTOMY  2005   CARDIAC CATHETERIZATION N/A 08/13/1990   75% pLCx, 75% mLCx, 25% mLAD, 75% D2, 50% right renal artery; Location: [facility]; Surgeon: Doristine Bosworth, MD   CATARACT EXTRACTION  2005   also had a macular hole in 2005   CATARACT EXTRACTION  2008   COLONOSCOPY     2003, 2014   COLONOSCOPY WITH PROPOFOL N/A 12/26/2021   Procedure: COLONOSCOPY WITH PROPOFOL;  Surgeon: Lucilla Lame, MD;  Location: [facility] ENDOSCOPY;  Service: Endoscopy;  Laterality: N/A;   ESOPHAGOGASTRODUODENOSCOPY     2004, 2014   ESOPHAGOGASTRODUODENOSCOPY (EGD) WITH PROPOFOL N/A 12/26/2021   Procedure: ESOPHAGOGASTRODUODENOSCOPY (EGD) WITH PROPOFOL;  Surgeon: Lucilla Lame, MD;  Location: [facility] ENDOSCOPY;  Service: Endoscopy;  Laterality: N/A;   HAND SURGERY Left 06/26/2011   Malignancy removed from left hand   [place]   penile inplant  1984   polyp of rectum  2003   [place]   Abdominal, had to have radiation treatment with the procedure   PROSTATE SURGERY      TONSILLECTOMY     TRANSURETHRAL RESECTION OF BLADDER TUMOR N/A 03/12/2021   Procedure: TRANSURETHRAL RESECTION OF BLADDER TUMOR (TURBT);  Surgeon: Abbie Sons, MD;  Location: [facility] ORS;  Service: Urology;  Laterality: N/A;   Social History:  reports that he quit smoking about 60 years ago. His smoking use included cigarettes. He has a 16.00 pack-year smoking history. He has never used smokeless tobacco. He reports that he does not currently use alcohol after a past usage of about 2.0 standard drinks of alcohol per week. He reports that he does not use drugs.  Allergies  Allergen Reactions   Levofloxacin Swelling    lips swelling   Povidone Iodine     Unknown, can not remember   Sulfa Antibiotics     Unknown, can not remember   Benadryl [Diphenhydramine] Rash   Family History  Problem Relation Age of Onset   Lung cancer Mother    Heart disease Father    Family history: Family history reviewed and not  pertinent.  Prior to Admission medications   Medication Sig Start Date End Date Taking? Authorizing Provider  Apoaequorin (PREVAGEN) 10 MG CAPS Take by mouth.   Yes [provider]  ARTIFICIAL TEAR SOLUTION OP Place 1 drop into both eyes 2 (two) times daily as needed (dry eyes).   Yes [provider]  aspirin 81 MG tablet Take 81 mg by mouth daily.  01/05/09  Yes [provider]  brimonidine (ALPHAGAN) 0.2 % ophthalmic solution Place 1 drop into the left eye 2 (two) times daily. 01/25/21  Yes [provider]  Calcium Carbonate-Vit D-Min (CALCIUM 1200 PO) Take by mouth.   Yes [provider]  donepezil (ARICEPT) 10 MG tablet TAKE 1 TABLET BY MOUTH AT BEDTIME 06/20/21  Yes [name], MD  fluticasone [facility]) 50 MCG/ACT nasal spray Place 2 sprays into both nostrils daily as needed for allergies. 09/25/21  Yes [name], MD  isosorbide mononitrate (IMDUR) 60 MG 24 hr tablet TAKE ONE TABLET EVERY DAY 10/20/22  Yes [name], MD   levothyroxine (SYNTHROID) 88 MCG tablet TAKE 1 TABLET EVERY DAY ON EMPTY STOMACHWITH A GLASS OF WATER AT LEAST 30-60 MINBEFORE BREAKFAST 10/29/22  Yes [name], MD  memantine (NAMENDA) 10 MG tablet TAKE 1 TABLET BY MOUTH TWO TIMES DAILY 11/19/21  Yes [name], MD  metoprolol succinate (TOPROL-XL) 25 MG 24 hr tablet Take 12.5 mg by mouth daily. 01/25/21  Yes [provider]  MULTIPLE VITAMIN PO Take 1 tablet by mouth daily.   Yes [provider]  NIFEdipine (PROCARDIA-XL/NIFEDICAL-XL) 30 MG 24 hr tablet Take 30 mg by mouth daily. 07/11/21  Yes [provider]  pravastatin (PRAVACHOL) 40 MG tablet TAKE 1 TABLET BY MOUTH DAILY 11/24/21  Yes [name], MD  ketoconazole (NIZORAL) 2 % cream APPLY TO GLANS OF PENIS DAILY 10/08/22   [name], MD  NON FORMULARY CPAP (Free Text) - Historical Medication  As directed  Started 22-Sep-1994 Active 09/22/1994   [provider]  PREVIDENT 5000 DRY MOUTH 1.1 % GEL dental gel Place 1 application onto teeth 2 (two) times daily. 02/19/21   [provider]   Physical Exam: Vitals:   11/13/22 1500 11/13/22 1530 11/13/22 1655 11/13/22 1700  BP: (!) 146/68 (!) 136/38 (!) 146/51 (!) 136/54  Pulse: 80 77 84 86  Resp: (!) 32 (!) 26 20 20  $ Temp:   97.9 F (36.6 C)   TempSrc:   Oral   SpO2: 100% 100% 100% 98%   Constitutional: appears age-appropriate, frail, NAD, calm, comfortable Eyes: PERRL, lids and conjunctivae normal ENMT: Mucous membranes are moist. Posterior pharynx clear of any exudate or lesions. Age-appropriate dentition. Hearing appropriate Neck: normal, supple, no masses, no thyromegaly Respiratory: clear to auscultation bilaterally, no wheezing, no crackles. Normal respiratory effort. No accessory muscle use.  Cardiovascular: Regular rate and rhythm, no murmurs / rubs / gallops. No extremity edema. 2+ pedal pulses. No carotid bruits.  Abdomen: no tenderness, no masses palpated, no  hepatosplenomegaly. Bowel sounds positive.  Musculoskeletal: no clubbing / cyanosis. No joint deformity upper and lower extremities. Good ROM, no contractures, no atrophy. Normal muscle tone.  Skin: Pale appearing, no rashes, lesions, ulcers. No induration Neurologic: Sensation intact. Strength 5/5 in all 4.  Psychiatric: Lacks judgment and insight consistent with history of dementia. Alert and oriented x self, location, current month.  Depressed and confused mood.   EKG: independently reviewed, showing atrial fibrillation with rate of 72,  QTc 439  Chest x-ray on Admission: I personally reviewed and I agree with radiologist reading as below.  DG Knee Complete 4 Views Left  Result Date: 11/13/2022 CLINICAL DATA:  Left knee pain, fall EXAM: LEFT KNEE - COMPLETE 4+ VIEW COMPARISON:  10/13/2022 FINDINGS: No evidence of fracture, dislocation, or joint effusion. Mild medial compartment joint space narrowing. Soft tissues are unremarkable. IMPRESSION: Negative. Electronically Signed   By: Davina Poke D.O.   On: 11/13/2022 15:35   DG Chest Port 1 View  Result Date: 11/13/2022 CLINICAL DATA:  Sepsis EXAM: PORTABLE CHEST 1 VIEW COMPARISON:  03/10/2022 FINDINGS: The heart size and mediastinal contours are within normal limits. Aortic atherosclerosis. Diffusely increased interstitial markings bilaterally. No focal consolidation. No pleural effusion or pneumothorax. The visualized skeletal structures are unremarkable. IMPRESSION: Diffusely increased interstitial markings bilaterally, which may reflect bronchitic lung changes, edema, or developing atypical/viral infection. Electronically Signed   By: Davina Poke D.O.   On: 11/13/2022 15:34   CT Head Wo Contrast  Result Date: 11/13/2022 CLINICAL DATA:  Mental status change, unknown cause EXAM: CT HEAD WITHOUT CONTRAST TECHNIQUE: Contiguous axial images were obtained from the base of the skull through the vertex without intravenous contrast. RADIATION  DOSE REDUCTION: This exam was performed according to the departmental dose-optimization program which includes automated exposure control, adjustment of the mA and/or kV according to patient size and/or use of iterative reconstruction technique. COMPARISON:  07/04/2022 FINDINGS: Brain: No evidence of acute infarction, hemorrhage, hydrocephalus, extra-axial collection or mass lesion/mass effect. Patchy low-density changes within the periventricular and subcortical white matter most compatible with chronic microvascular ischemic change. Moderate diffuse cerebral volume loss. Vascular: Atherosclerotic calcifications involving the large vessels of the skull base. No unexpected hyperdense vessel. Skull: Normal. Negative for fracture or focal lesion. Sinuses/Orbits: Chronic opacification of the frontal sinuses. Prior sinus surgery. Mastoid air cells are clear. Other: None. IMPRESSION: 1. No acute intracranial findings. 2. Chronic microvascular ischemic change and cerebral volume loss. Electronically Signed   By: Davina Poke D.O.   On: 11/13/2022 15:26    Labs on Admission: I have personally reviewed following labs  CBC: Recent Labs  Lab 11/13/22 1347  WBC 7.1  NEUTROABS 4.8  HGB 6.0*  HCT 20.9*  MCV 81.6  PLT 0000000   Basic Metabolic Panel: Recent Labs  Lab 11/13/22 1347  NA 138  K 4.7  CL 107  CO2 20*  GLUCOSE 182*  BUN 25*  CREATININE 1.43*  CALCIUM 8.6*   GFR: CrCl cannot be calculated (Unknown ideal weight.).  Liver Function Tests: Recent Labs  Lab 11/13/22 1347  AST 23  ALT 15  ALKPHOS 68  BILITOT 0.6  PROT 6.6  ALBUMIN 3.3*   Coagulation Profile: Recent Labs  Lab 11/13/22 1347  INR 1.2   Urine analysis:    Component Value Date/Time   COLORURINE YELLOW 07/04/2022 0614   APPEARANCEUR CLEAR 07/04/2022 0614   APPEARANCEUR Cloudy (A) 02/28/2021 1454   LABSPEC >1.030 (H) 07/04/2022 0614   LABSPEC 1.018 10/21/2012 1747   PHURINE 5.5 07/04/2022 0614   GLUCOSEU  NEGATIVE 07/04/2022 0614   GLUCOSEU Negative 10/21/2012 1747   HGBUR LARGE (A) 07/04/2022 0614   BILIRUBINUR NEGATIVE 07/04/2022 0614   BILIRUBINUR Negative 02/28/2021 1454   BILIRUBINUR Negative 10/21/2012 1747   KETONESUR NEGATIVE 07/04/2022 0614   PROTEINUR 100 (A) 07/04/2022 0614   NITRITE NEGATIVE 07/04/2022 0614   LEUKOCYTESUR SMALL (A) 07/04/2022 0614   LEUKOCYTESUR Negative 10/21/2012 1747   CRITICAL CARE Performed by:  Dr. Tobie Poet  Total critical care time: 35 minutes  Critical care time was exclusive of separately billable procedures and treating other patients.  Critical care was necessary to treat or prevent imminent or life-threatening deterioration.  Critical care was time spent personally by me on the following activities: development of treatment plan with patient and/or surrogate as well as nursing, discussions with consultants, evaluation of patient's response to treatment, examination of patient, obtaining history from patient or surrogate, ordering and performing treatments and interventions, ordering and review of laboratory studies, ordering and review of radiographic studies, pulse oximetry and re-evaluation of patient's condition.  This document was prepared using Dragon Voice Recognition software and may include unintentional dictation errors.  Dr. Tobie Poet [facility]  If 7PM-7AM, please contact overnight-coverage provider If 7AM-7PM, please contact day coverage provider [email or web address]  11/13/2022, 5:16 PM

## 2022-11-14 DIAGNOSIS — D649 Anemia, unspecified: Secondary | ICD-10-CM | POA: Diagnosis not present

## 2022-11-14 LAB — HEMOGLOBIN AND HEMATOCRIT, BLOOD
HCT: 26.1 % — ABNORMAL LOW (ref 39.0–52.0)
HCT: 24.2 % — ABNORMAL LOW (ref 39.0–52.0)
HCT: 26.2 % — ABNORMAL LOW (ref 39.0–52.0)
HCT: 26.3 % — ABNORMAL LOW (ref 39.0–52.0)
Hemoglobin: 8.1 g/dL — ABNORMAL LOW (ref 13.0–17.0)
Hemoglobin: 7.6 g/dL — ABNORMAL LOW (ref 13.0–17.0)
Hemoglobin: 8.2 g/dL — ABNORMAL LOW (ref 13.0–17.0)
Hemoglobin: 8.2 g/dL — ABNORMAL LOW (ref 13.0–17.0)

## 2022-11-14 LAB — PREPARE RBC (CROSSMATCH)

## 2022-11-14 LAB — CBC
HCT: 21.1 % — ABNORMAL LOW (ref 39.0–52.0)
Hemoglobin: 6.5 g/dL — ABNORMAL LOW (ref 13.0–17.0)
MCH: 24.3 pg — ABNORMAL LOW (ref 26.0–34.0)
MCHC: 30.8 g/dL (ref 30.0–36.0)
MCV: 78.7 fL — ABNORMAL LOW (ref 80.0–100.0)
Platelets: 203 10*3/uL (ref 150–400)
RBC: 2.68 MIL/uL — ABNORMAL LOW (ref 4.22–5.81)
RDW: 15 % (ref 11.5–15.5)
WBC: 6.1 10*3/uL (ref 4.0–10.5)
nRBC: 0 % (ref 0.0–0.2)

## 2022-11-14 LAB — BASIC METABOLIC PANEL
Anion gap: 7 (ref 5–15)
BUN: 21 mg/dL (ref 8–23)
CO2: 21 mmol/L — ABNORMAL LOW (ref 22–32)
Calcium: 7.9 mg/dL — ABNORMAL LOW (ref 8.9–10.3)
Chloride: 112 mmol/L — ABNORMAL HIGH (ref 98–111)
Creatinine, Ser: 1.37 mg/dL — ABNORMAL HIGH (ref 0.61–1.24)
GFR, Estimated: 48 mL/min — ABNORMAL LOW (ref >60)
Glucose, Bld: 97 mg/dL (ref 70–99)
Potassium: 4 mmol/L (ref 3.5–5.1)
Sodium: 140 mmol/L (ref 135–145)

## 2022-11-14 LAB — URINALYSIS, W/ REFLEX TO CULTURE (INFECTION SUSPECTED)
Bacteria, UA: NONE SEEN
Bilirubin Urine: NEGATIVE
Glucose, UA: NEGATIVE mg/dL
Ketones, ur: NEGATIVE mg/dL
Leukocytes,Ua: NEGATIVE
Nitrite: NEGATIVE
Protein, ur: 30 mg/dL — AB
Specific Gravity, Urine: 1.015 (ref 1.005–1.030)
Squamous Epithelial / HPF: NONE SEEN /HPF (ref 0–5)
pH: 7 (ref 5.0–8.0)

## 2022-11-14 LAB — VITAMIN B12: Vitamin B-12: 176 pg/mL — ABNORMAL LOW (ref 180–914)

## 2022-11-14 LAB — HEPATIC FUNCTION PANEL
ALT: 14 U/L (ref 0–44)
AST: 26 U/L (ref 15–41)
Albumin: 3 g/dL — ABNORMAL LOW (ref 3.5–5.0)
Alkaline Phosphatase: 56 U/L (ref 38–126)
Bilirubin, Direct: 0.1 mg/dL (ref 0.0–0.2)
Indirect Bilirubin: 0.6 mg/dL (ref 0.3–0.9)
Total Bilirubin: 0.7 mg/dL (ref 0.3–1.2)
Total Protein: 5.7 g/dL — ABNORMAL LOW (ref 6.5–8.1)

## 2022-11-14 MED ORDER — SODIUM CHLORIDE 0.9 % IV SOLN: INTRAVENOUS | Status: DC

## 2022-11-14 MED ORDER — SODIUM CHLORIDE 0.9% IV SOLUTION: Freq: Once | INTRAVENOUS | Status: AC

## 2022-11-14 NOTE — Progress Notes (Signed)
Progress Note   Patient: Roger Cook [id number] DOB: [birth year] DOA: 11/13/2022     1 DOS: the patient was seen and examined on 11/14/2022   Brief hospital course: [name] is a 89 year old male with history of dementia, hypertension, hyperlipidemia, hypothyroid, history of TIA, history of prostate cancer, Bell's palsy, who presented to the ED on 11/13/2022 for evaluation after having an unwitnessed fall at home.  Initial vitals in the ED showed temperature of 97.4, respiration rate of 24, heart rate of 71, blood pressure 161/53, SpO2 of 100% on room air. Initial labs most notable for anemia with Hbg 6.0, Cr 1.43, Lactic acid 3.9.  Treated in the ED with Protonix 40 mg IV one-time dose, ceftriaxone 2 g IV, LR infusion.  Typed and screened and 1 unit of PRBC were ordered for transfusion.  Admitted to hospitalist service with gastroenterology consulted.  GI plans for EGD once Hbg > 7, hopefully tomorrow 2/24.   Assessment and Plan: * Symptomatic anemia Severe acute on chronic anemia Suspect secondary to upper GI bleed. Hemodynamically stable Initial Hbg 6.0.  Transfused 1 unit pRBC's on admission. Hbg this AM 6.5.  2nd unit pRBC's transfused Repeat post-transfusion 8.1. --GI consulted for endoscopic evaluation --Possible EGD tomorrow --Trend Hbg and transfuse if < 8.0 given CAD hx - Protonix gtt. - Follow up anemia panel   Elevated lactic acid level Most likely due to GI bleeding and severe anemia. Unlikely infectious etiology. Lactate trend: 3.9 >> 2.4 Trend lactate until normalized.  Severe anemia See Symptomatic Anemia  Essential hypertension Continue Imdur, Toprol-XL, nifedipine  Dementia (HCC) No behavioral disturbances reported. - Continue Namenda and Aricept  Constipation Stool softeners PRN  Hypothyroidism Continue levothyroxine   Severe obstructive sleep apnea CPAP nightly ordered  Arteriosclerosis of coronary artery Stable. No acute  issues. Continue metoprolol        Subjective: Pt awake sitting up in bed this AM.  Reports he feels great and wants to go home.  Agreeable with staying until anemia is stable and evaluation completed.  Seems pleasantly confused but oriented to self and hospital.    Physical Exam: Vitals:   11/14/22 0432 11/14/22 0530 11/14/22 0652 11/14/22 0738  BP: (!) 132/50  (!) 144/44 (!) 131/38  Pulse: (!) 50  62 64  Resp: 18   14  Temp: 97.6 F (36.4 C)  98.3 F (36.8 C) 97.8 F (36.6 C)  TempSrc: Oral  Oral   SpO2: 97%  94% 94%  Weight:  66.7 kg     General exam: awake, alert, no acute distress HEENT: atraumatic, clear conjunctiva, anicteric sclera, moist mucus membranes, hearing grossly normal  Respiratory system: CTAB, no wheezes, rales or rhonchi, normal respiratory effort. Cardiovascular system: normal S1/S2, RRR, no pedal edema.   Gastrointestinal system: soft, NT, ND, no HSM felt, +bowel sounds. Central nervous system: A&O x 2. no gross focal neurologic deficits, normal speech Extremities: moves all, no edema, normal tone Skin: dry, intact, normal temperature Psychiatry: normal mood, congruent affect, abnormal judgement and insight due to dementia   Data Reviewed:  Notable labs ---- Hbg 6.0 >> 6.5 >> 8.1,   Family Communication: None present. Will attempt to call.  Disposition: Status is: Inpatient Remains inpatient appropriate because: ongoing evaluation, required blood transfusion this AM.   D/C will be pending GI clearance and stable Hbg x 48 at least hours.   Planned Discharge Destination: Home    Time spent: 40 minutes  Author: Ezekiel Slocumb, DO 11/14/2022 11:39  AM  For on call review [email or web address].

## 2022-11-14 NOTE — Assessment & Plan Note (Signed)
Stable. No acute issues. Continue metoprolol

## 2022-11-14 NOTE — Progress Notes (Signed)
       CROSS COVER NOTE  NAME: Roger Cook MRN: [id number] DOB : May 10, 1927    HPI/Events of Note   Nurse reports repeat HGB 6.5  Assessment and  Interventions   Assessment: VSS Stated HGB goal is 8 Plan: Additional unit PRBCs ordered for transfusion       Kathlene Cote NP [facility]

## 2022-11-14 NOTE — Plan of Care (Signed)
  Problem: Education: Goal: Knowledge of General Education information will improve Description: Including pain rating scale, medication(s)/side effects and non-pharmacologic comfort measures 11/14/2022 0733 by Shauna Hugh, RN Outcome: Progressing 11/13/2022 1847 by Shauna Hugh, RN Outcome: Progressing   Problem: Health Behavior/Discharge Planning: Goal: Ability to manage health-related needs will improve 11/14/2022 0733 by Shauna Hugh, RN Outcome: Progressing 11/13/2022 1847 by Shauna Hugh, RN Outcome: Progressing   Problem: Clinical Measurements: Goal: Ability to maintain clinical measurements within normal limits will improve 11/14/2022 0733 by Shauna Hugh, RN Outcome: Progressing 11/13/2022 1847 by Shauna Hugh, RN Outcome: Progressing Goal: Will remain free from infection 11/14/2022 0733 by Shauna Hugh, RN Outcome: Progressing 11/13/2022 1847 by Shauna Hugh, RN Outcome: Progressing Goal: Diagnostic test results will improve 11/14/2022 0733 by Shauna Hugh, RN Outcome: Progressing 11/13/2022 1847 by Shauna Hugh, RN Outcome: Progressing Goal: Respiratory complications will improve 11/14/2022 0733 by Shauna Hugh, RN Outcome: Progressing 11/13/2022 1847 by Shauna Hugh, RN Outcome: Progressing Goal: Cardiovascular complication will be avoided 11/14/2022 0733 by Shauna Hugh, RN Outcome: Progressing 11/13/2022 1847 by Shauna Hugh, RN Outcome: Progressing   Problem: Activity: Goal: Risk for activity intolerance will decrease 11/14/2022 0733 by Shauna Hugh, RN Outcome: Progressing 11/13/2022 1847 by Shauna Hugh, RN Outcome: Progressing   Problem: Nutrition: Goal: Adequate nutrition will be maintained 11/14/2022 0733 by Shauna Hugh, RN Outcome: Progressing 11/13/2022 1847 by Shauna Hugh, RN Outcome: Progressing   Problem: Coping: Goal: Level of anxiety will decrease 11/14/2022 0733 by Shauna Hugh, RN Outcome: Progressing 11/13/2022 1847 by  Shauna Hugh, RN Outcome: Progressing   Problem: Elimination: Goal: Will not experience complications related to bowel motility 11/14/2022 0733 by Shauna Hugh, RN Outcome: Progressing 11/13/2022 1847 by Shauna Hugh, RN Outcome: Progressing Goal: Will not experience complications related to urinary retention 11/14/2022 0733 by Shauna Hugh, RN Outcome: Progressing 11/13/2022 1847 by Shauna Hugh, RN Outcome: Progressing   Problem: Pain Managment: Goal: General experience of comfort will improve 11/14/2022 0733 by Shauna Hugh, RN Outcome: Progressing 11/13/2022 1847 by Shauna Hugh, RN Outcome: Progressing   Problem: Safety: Goal: Ability to remain free from injury will improve 11/14/2022 0733 by Shauna Hugh, RN Outcome: Progressing 11/13/2022 1847 by Shauna Hugh, RN Outcome: Progressing   Problem: Skin Integrity: Goal: Risk for impaired skin integrity will decrease 11/14/2022 0733 by Shauna Hugh, RN Outcome: Progressing 11/13/2022 1847 by Shauna Hugh, RN Outcome: Progressing

## 2022-11-14 NOTE — Anesthesia Preprocedure Evaluation (Signed)
Anesthesia Evaluation  Patient identified by MRN, date of birth, ID band Patient awake    Reviewed: Allergy & Precautions, H&P , NPO status , Patient's Chart, lab work & pertinent test results  Airway Mallampati: II  TM Distance: >3 FB Neck ROM: full    Dental no notable dental hx.    Pulmonary sleep apnea , former smoker   Pulmonary exam normal        Cardiovascular hypertension, + CAD  Normal cardiovascular exam     Neuro/Psych  PSYCHIATRIC DISORDERS     Dementia TIA   GI/Hepatic Neg liver ROS,GERD  ,,  Endo/Other  Hypothyroidism    Renal/GU negative Renal ROS  negative genitourinary   Musculoskeletal   Abdominal   Peds  Hematology  (+) Blood dyscrasia, anemia   Anesthesia Other Findings Severe acute on chronic anemia Suspect secondary to upper GI bleed. Hemodynamically stable Initial Hbg 6.0.  Transfused 1 unit pRBC's on admission.   Past Medical History: No date: Anemia No date: Aortic atherosclerosis (HCC) No date: Bell palsy No date: Bilateral renal cysts No date: Bladder tumor No date: CAD (coronary artery disease) No date: DOE (dyspnea on exertion) No date: GERD (gastroesophageal reflux disease) No date: History of angina No date: History of penile implant No date: History of prostate cancer No date: Hyperlipidemia No date: Hypertension No date: Hypothyroidism No date: Lipoma 02/2011: MRSA bacteremia No date: Nephrolithiasis No date: OSA on CPAP No date: Pneumonia     Comment:  as a teenager  Past Surgical History: 2005: APPENDECTOMY 08/13/1990: CARDIAC CATHETERIZATION; N/A     Comment:  75% pLCx, 75% mLCx, 25% mLAD, 75% D2, 50% right renal               artery; Location: [facility]; Surgeon: Doristine Bosworth, MD 2005: CATARACT EXTRACTION     Comment:  also had a macular hole in 2005 2008: CATARACT EXTRACTION No date: COLONOSCOPY     Comment:  2003, 2014 12/26/2021: COLONOSCOPY WITH PROPOFOL;  N/A     Comment:  Procedure: COLONOSCOPY WITH PROPOFOL;  Surgeon: Lucilla Lame, MD;  Location: [facility] ENDOSCOPY;  Service:               Endoscopy;  Laterality: N/A; No date: ESOPHAGOGASTRODUODENOSCOPY     Comment:  2004, 2014 12/26/2021: ESOPHAGOGASTRODUODENOSCOPY (EGD) WITH PROPOFOL; N/A     Comment:  Procedure: ESOPHAGOGASTRODUODENOSCOPY (EGD) WITH               PROPOFOL;  Surgeon: Lucilla Lame, MD;  Location: [facility]               ENDOSCOPY;  Service: Endoscopy;  Laterality: N/A; 06/26/2011: HAND SURGERY; Left     Comment:  Malignancy removed from left hand 1986: [place]: NASAL SINUS SURGERY 1984: penile inplant 2003: polyp of rectum 1975: PROSTATE SURGERY     Comment:  Abdominal, had to have radiation treatment with the               procedure No date: PROSTATE SURGERY No date: TONSILLECTOMY 03/12/2021: TRANSURETHRAL RESECTION OF BLADDER TUMOR; N/A     Comment:  Procedure: TRANSURETHRAL RESECTION OF BLADDER TUMOR               (TURBT);  Surgeon: Abbie Sons, MD;  Location: [facility]               ORS;  Service: Urology;  Laterality: N/A;  BMI    Body Mass Index: 23.73 kg/m      Reproductive/Obstetrics negative OB ROS                              Anesthesia Physical Anesthesia Plan  ASA:   Anesthesia Plan: General   Post-op Pain Management:    Induction:   PONV Risk Score and Plan: Propofol infusion and TIVA  Airway Management Planned:   Additional Equipment:   Intra-op Plan:   Post-operative Plan:   Informed Consent:      Dental Advisory Given  Plan Discussed with: CRNA and Surgeon  Anesthesia Plan Comments:          Anesthesia Quick Evaluation

## 2022-11-14 NOTE — Assessment & Plan Note (Signed)
Stool softeners PRN

## 2022-11-14 NOTE — H&P (Signed)
Roger Cook , MD [address], [place], [place], [place], [address] [address], [place], [place], [place], [address] Phone: [phone]  Fax: [phone]  Consultation  Referring Provider: Dr. Tobie Cook Primary Care Physician:  Roger Sons, MD Primary Gastroenterologist: None       Reason for Consultation: Roger Cook Date of Admission:  11/13/2022 Date of Consultation:  11/14/2022         HPI:   Roger Cook is a 89 y.o. male with a history of dementia hypertension hyperlipidemia prostate cancer presented to the emergency room after a fall.  In the ER noted to have hemoglobin of 6 g, MCV 81.6, INR 1.2 ferritin of 5.  Lactic acid was 3.9.  Baseline creatinine 1.19-1.3 on admission of 1.43 BUN at baseline 25 This morning hemoglobin is 6.5 g  CT head on admission no acute findings.  Patient denies any dark stools . Aware of place , president but not year.   Denies any other complaints.  Past Medical History:  Diagnosis Date   Anemia    Aortic atherosclerosis (HCC)    Bell palsy    Bilateral renal cysts    Bladder tumor    CAD (coronary artery disease)    DOE (dyspnea on exertion)    GERD (gastroesophageal reflux disease)    History of angina    History of penile implant    History of prostate cancer    Hyperlipidemia    Hypertension    Hypothyroidism    Lipoma    MRSA bacteremia 02/2011   Nephrolithiasis    OSA on CPAP    Pneumonia    as a teenager    Past Surgical History:  Procedure Laterality Date   APPENDECTOMY  2005   CARDIAC CATHETERIZATION N/A 08/13/1990   75% pLCx, 75% mLCx, 25% mLAD, 75% D2, 50% right renal artery; Location: [facility]; Surgeon: Doristine Bosworth, MD   CATARACT EXTRACTION  2005   also had a macular hole in 2005   CATARACT EXTRACTION  2008   COLONOSCOPY     2003, 2014   COLONOSCOPY WITH PROPOFOL N/A 12/26/2021   Procedure: COLONOSCOPY WITH PROPOFOL;  Surgeon: Lucilla Lame, MD;  Location: [facility] ENDOSCOPY;  Service: Endoscopy;  Laterality: N/A;    ESOPHAGOGASTRODUODENOSCOPY     2004, 2014   ESOPHAGOGASTRODUODENOSCOPY (EGD) WITH PROPOFOL N/A 12/26/2021   Procedure: ESOPHAGOGASTRODUODENOSCOPY (EGD) WITH PROPOFOL;  Surgeon: Lucilla Lame, MD;  Location: [facility] ENDOSCOPY;  Service: Endoscopy;  Laterality: N/A;   HAND SURGERY Left 06/26/2011   Malignancy removed from left hand   [place]   penile inplant  1984   polyp of rectum  2003   [place]   Abdominal, had to have radiation treatment with the procedure   PROSTATE SURGERY     TONSILLECTOMY     TRANSURETHRAL RESECTION OF BLADDER TUMOR N/A 03/12/2021   Procedure: TRANSURETHRAL RESECTION OF BLADDER TUMOR (TURBT);  Surgeon: Abbie Sons, MD;  Location: [facility] ORS;  Service: Urology;  Laterality: N/A;    Prior to Admission medications   Medication Sig Start Date End Date Taking? Authorizing Provider  Apoaequorin (PREVAGEN) 10 MG CAPS Take by mouth.   Yes [provider]  ARTIFICIAL TEAR SOLUTION OP Place 1 drop into both eyes 2 (two) times daily as needed (dry eyes).   Yes [provider]  aspirin 81 MG tablet Take 81 mg by mouth daily.  01/05/09  Yes  [provider]  brimonidine (ALPHAGAN) 0.2 % ophthalmic solution Place 1 drop into the left eye 2 (two) times daily. 01/25/21  Yes [provider]  Calcium Carbonate-Vit D-Min (CALCIUM 1200 PO) Take by mouth.   Yes [provider]  donepezil (ARICEPT) 10 MG tablet TAKE 1 TABLET BY MOUTH AT BEDTIME 06/20/21  Yes Roger Sons, MD  fluticasone [facility]) 50 MCG/ACT nasal spray Place 2 sprays into both nostrils daily as needed for allergies. 09/25/21  Yes Roger Sons, MD  isosorbide mononitrate (IMDUR) 60 MG 24 hr tablet TAKE ONE TABLET EVERY DAY 10/20/22  Yes Roger Sons, MD  levothyroxine (SYNTHROID) 88 MCG tablet TAKE 1 TABLET EVERY DAY ON EMPTY STOMACHWITH A GLASS OF WATER AT LEAST 30-60 MINBEFORE BREAKFAST 10/29/22  Yes Roger Sons, MD  memantine (NAMENDA) 10 MG tablet TAKE 1 TABLET BY MOUTH TWO TIMES DAILY 11/19/21  Yes Roger Sons, MD  metoprolol succinate (TOPROL-XL) 25 MG 24 hr tablet Take 12.5 mg by mouth daily. 01/25/21  Yes [provider]  MULTIPLE VITAMIN PO Take 1 tablet by mouth daily.   Yes [provider]  NIFEdipine (PROCARDIA-XL/NIFEDICAL-XL) 30 MG 24 hr tablet Take 30 mg by mouth daily. 07/11/21  Yes [provider]  pravastatin (PRAVACHOL) 40 MG tablet TAKE 1 TABLET BY MOUTH DAILY 11/24/21  Yes Roger Sons, MD  ketoconazole (NIZORAL) 2 % cream APPLY TO GLANS OF PENIS DAILY 10/08/22   Roger Sons, MD  NON FORMULARY CPAP (Free Text) - Historical Medication  As directed  Started 22-Sep-1994 Active 09/22/1994   [provider]  PREVIDENT 5000 DRY MOUTH 1.1 % GEL dental gel Place 1 application onto teeth 2 (two) times daily. 02/19/21   [provider]    Family History  Problem Relation Age of Onset   Lung cancer Mother    Heart disease Father      Social History   Tobacco Use   Smoking status: Former    Packs/day: 1.00    Years: 16.00    Total pack years: 16.00    Types: Cigarettes    Quit date: 1964    Years since quitting: 60.1   Smokeless tobacco: Never  Vaping Use   Vaping Use: Never used  Substance Use Topics   Alcohol use: Not Currently    Alcohol/week: 2.0 standard drinks of alcohol    Types: 2 Glasses of wine per week   Drug use: No    Allergies as of 11/13/2022 - Review Complete 11/13/2022  Allergen Reaction Noted   Levofloxacin Swelling 02/26/2015   Povidone iodine  02/26/2015   Sulfa antibiotics  01/28/2018   Benadryl [diphenhydramine] Rash 08/10/2016    Review of Systems:    All systems reviewed and negative except where noted in HPI.   Physical Exam:  Vital signs in last 24 hours: Temp:  [97.4 F (36.3 C)-99.2 F (37.3 C)] 97.8 F (36.6 C) (02/23 0738) Pulse Rate:  [49-90] 64 (02/23 0738) Resp:  [14-32] 14  (02/23 0738) BP: (114-161)/(38-78) 131/38 (02/23 0738) SpO2:  [92 %-100 %] 94 % (02/23 0738) FiO2 (%):  [21 %] 21 % (02/22 2225) Weight:  [66.7 kg] 66.7 kg (02/23 0530)   General:   Pleasant, cooperative in NAD Head:  Normocephalic and atraumatic. Eyes:   No icterus.   Conjunctiva pink. PERRLA. Ears:  Normal auditory acuity. Neck:  Supple; no masses or thyroidomegaly Lungs: Respirations even and unlabored. Lungs clear to auscultation bilaterally.  No wheezes, crackles, or rhonchi.  Heart:  Regular rate and rhythm;  Without murmur, clicks, rubs or gallops Abdomen:  Soft, nondistended, nontender. Normal bowel sounds. No appreciable masses or hepatomegaly.  No rebound or guarding.  Neurologic:  Alert and oriented x2   Skin:  Intact without significant lesions or rashes. Cervical Nodes:  No significant cervical adenopathy. Psych:  Alert and cooperative. Normal affect.  LAB RESULTS: Recent Labs    11/13/22 1347 11/14/22 0102  WBC 7.1 6.1  HGB 6.0* 6.5*  HCT 20.9* 21.1*  PLT 278 203   BMET Recent Labs    11/13/22 1347 11/14/22 0102  NA 138 140  K 4.7 4.0  CL 107 112*  CO2 20* 21*  GLUCOSE 182* 97  BUN 25* 21  CREATININE 1.43* 1.37*  CALCIUM 8.6* 7.9*   LFT Recent Labs    11/14/22 0102  PROT 5.7*  ALBUMIN 3.0*  AST 26  ALT 14  ALKPHOS 56  BILITOT 0.7  BILIDIR 0.1  IBILI 0.6   PT/INR Recent Labs    11/13/22 1347  LABPROT 14.9  INR 1.2    STUDIES: DG Knee Complete 4 Views Left  Result Date: 11/13/2022 CLINICAL DATA:  Left knee pain, fall EXAM: LEFT KNEE - COMPLETE 4+ VIEW COMPARISON:  10/13/2022 FINDINGS: No evidence of fracture, dislocation, or joint effusion. Mild medial compartment joint space narrowing. Soft tissues are unremarkable. IMPRESSION: Negative. Electronically Signed   By: Davina Poke D.O.   On: 11/13/2022 15:35   DG Chest Port 1 View  Result Date: 11/13/2022 CLINICAL DATA:  Sepsis EXAM: PORTABLE CHEST 1 VIEW COMPARISON:  03/10/2022  FINDINGS: The heart size and mediastinal contours are within normal limits. Aortic atherosclerosis. Diffusely increased interstitial markings bilaterally. No focal consolidation. No pleural effusion or pneumothorax. The visualized skeletal structures are unremarkable. IMPRESSION: Diffusely increased interstitial markings bilaterally, which may reflect bronchitic lung changes, edema, or developing atypical/viral infection. Electronically Signed   By: Davina Poke D.O.   On: 11/13/2022 15:34   CT Head Wo Contrast  Result Date: 11/13/2022 CLINICAL DATA:  Mental status change, unknown cause EXAM: CT HEAD WITHOUT CONTRAST TECHNIQUE: Contiguous axial images were obtained from the base of the skull through the vertex without intravenous contrast. RADIATION DOSE REDUCTION: This exam was performed according to the departmental dose-optimization program which includes automated exposure control, adjustment of the mA and/or kV according to patient size and/or use of iterative reconstruction technique. COMPARISON:  07/04/2022 FINDINGS: Brain: No evidence of acute infarction, hemorrhage, hydrocephalus, extra-axial collection or mass lesion/mass effect. Patchy low-density changes within the periventricular and subcortical white matter most compatible with chronic microvascular ischemic change. Moderate diffuse cerebral volume loss. Vascular: Atherosclerotic calcifications involving the large vessels of the skull base. No unexpected hyperdense vessel. Skull: Normal. Negative for fracture or focal lesion. Sinuses/Orbits: Chronic opacification of the frontal sinuses. Prior sinus surgery. Mastoid air cells are clear. Other: None. IMPRESSION: 1. No acute intracranial findings. 2. Chronic microvascular ischemic change and cerebral volume loss. Electronically Signed   By: Davina Poke D.O.   On: 11/13/2022 15:26      Impression / Plan:   Roger Cook is a 89 y.o. y/o male with dementia presents to the emergency room  after a fall that was unwitnessed.  Emergency room mentions black stool per rectum.  Patient has microcytic iron deficiency anemia.  In addition had elevated lactic acid.  This morning despite transfusion hemoglobin 6.5 g.  Baseline hemoglobin for months back 11.5 g and MCV  of 96.5.  Plan 1.  Monitor CBC and transfuse as needed 2.  IV iron follow-up B12 levels if low replace 3.  With black stools likely an acute on chronic blood loss anemia. 4.  If you review his prior urine analysis there has been blood present in the urine it could be a source of the blood loss as well as anemia consider evaluation. 5.  From the GI point of view once hemoglobin over 7 g we can consider an EGD which will plan for tomorrow , unsure if he will be able to prep for colonoscopy.  May need to discuss goals of care and how far he would like to pursue care.  If there is a significant drop in hemoglobin with no overt blood loss consider intra-abdominal bleeds due to history of fall 6.  IV PPI.  I have discussed alternative options, risks & benefits,  which include, but are not limited to, bleeding, infection, perforation,respiratory complication & drug reaction.  The patient agrees with this plan & written consent will be obtained.    Thank you for involving me in the care of this patient.      LOS: 1 day   Roger Bellows, MD  11/14/2022, 8:07 AM

## 2022-11-15 ENCOUNTER — Inpatient Hospital Stay: Payer: Medicare Other | Admitting: Certified Registered"

## 2022-11-15 ENCOUNTER — Encounter: Payer: Self-pay | Admitting: Internal Medicine

## 2022-11-15 ENCOUNTER — Encounter
Admission: EM | Disposition: A | Discharge status: Home Health | Payer: Self-pay | Source: Home / Self Care | Attending: Internal Medicine

## 2022-11-15 DIAGNOSIS — D649 Anemia, unspecified: Secondary | ICD-10-CM | POA: Diagnosis not present

## 2022-11-15 DIAGNOSIS — R7881 Bacteremia: Secondary | ICD-10-CM | POA: Diagnosis present

## 2022-11-15 HISTORY — PX: ESOPHAGOGASTRODUODENOSCOPY (EGD) WITH PROPOFOL: SHX5813

## 2022-11-15 LAB — TYPE AND SCREEN
ABO/RH(D): AB POS
Antibody Screen: NEGATIVE
Unit division: 0
Unit division: 0

## 2022-11-15 LAB — BASIC METABOLIC PANEL WITH GFR
Anion gap: 10 (ref 5–15)
BUN: 17 mg/dL (ref 8–23)
CO2: 21 mmol/L — ABNORMAL LOW (ref 22–32)
Calcium: 8.2 mg/dL — ABNORMAL LOW (ref 8.9–10.3)
Chloride: 107 mmol/L (ref 98–111)
Creatinine, Ser: 1.42 mg/dL — ABNORMAL HIGH (ref 0.61–1.24)
GFR, Estimated: 46 mL/min — ABNORMAL LOW
Glucose, Bld: 96 mg/dL (ref 70–99)
Potassium: 3.8 mmol/L (ref 3.5–5.1)
Sodium: 138 mmol/L (ref 135–145)

## 2022-11-15 LAB — BPAM RBC
Blood Product Expiration Date: 202403132359
Blood Product Expiration Date: 202403152359
ISSUE DATE / TIME: 202402221651
ISSUE DATE / TIME: 202402230405
Unit Type and Rh: 6200
Unit Type and Rh: 6200

## 2022-11-15 LAB — LACTIC ACID, PLASMA: Lactic Acid, Venous: 0.9 mmol/L (ref 0.5–1.9)

## 2022-11-15 LAB — CBC
HCT: 26.1 % — ABNORMAL LOW (ref 39.0–52.0)
Hemoglobin: 7.9 g/dL — ABNORMAL LOW (ref 13.0–17.0)
MCH: 24.2 pg — ABNORMAL LOW (ref 26.0–34.0)
MCHC: 30.3 g/dL (ref 30.0–36.0)
MCV: 79.8 fL — ABNORMAL LOW (ref 80.0–100.0)
Platelets: 204 10*3/uL (ref 150–400)
RBC: 3.27 MIL/uL — ABNORMAL LOW (ref 4.22–5.81)
RDW: 15.5 % (ref 11.5–15.5)
WBC: 7.5 10*3/uL (ref 4.0–10.5)
nRBC: 0 % (ref 0.0–0.2)

## 2022-11-15 LAB — HEMOGLOBIN AND HEMATOCRIT, BLOOD
HCT: 27.4 % — ABNORMAL LOW (ref 39.0–52.0)
Hemoglobin: 8.3 g/dL — ABNORMAL LOW (ref 13.0–17.0)

## 2022-11-15 SURGERY — ESOPHAGOGASTRODUODENOSCOPY (EGD) WITH PROPOFOL
Anesthesia: General

## 2022-11-15 MED ORDER — LIDOCAINE HCL (PF) 2 % IJ SOLN
INTRAMUSCULAR | Status: AC
  Filled 2022-11-15: qty 5

## 2022-11-15 MED ORDER — PROPOFOL 10 MG/ML IV BOLUS
INTRAVENOUS | Status: AC
  Filled 2022-11-15: qty 20

## 2022-11-15 MED ORDER — PROPOFOL 10 MG/ML IV BOLUS
INTRAVENOUS | Status: DC | PRN
  Administered 2022-11-15: 30 mg via INTRAVENOUS
  Administered 2022-11-15: 75 ug/kg/min via INTRAVENOUS

## 2022-11-15 MED ORDER — LIDOCAINE HCL (CARDIAC) PF 100 MG/5ML IV SOSY
PREFILLED_SYRINGE | INTRAVENOUS | Status: DC | PRN
  Administered 2022-11-15: 120 mg via INTRAVENOUS

## 2022-11-15 NOTE — Progress Notes (Addendum)
Progress Note   Patient: Roger Cook [id number] DOB: 15-Aug-1927 DOA: 11/13/2022     2 DOS: the patient was seen and examined on 11/15/2022   Brief hospital course: Roger Cook is a 89 year old male with history of dementia, hypertension, hyperlipidemia, hypothyroid, history of TIA, history of prostate cancer, Bell's palsy, who presented to the ED on 11/13/2022 for evaluation after having an unwitnessed fall at home.  Initial vitals in the ED showed temperature of 97.4, respiration rate of 24, heart rate of 71, blood pressure 161/53, SpO2 of 100% on room air. Initial labs most notable for anemia with Hbg 6.0, Cr 1.43, Lactic acid 3.9.  Treated in the ED with Protonix 40 mg IV one-time dose, ceftriaxone 2 g IV, LR infusion.  Typed and screened and 1 unit of PRBC were ordered for transfusion.  Admitted to hospitalist service with gastroenterology consulted.  GI plans for EGD once Hbg > 7, hopefully tomorrow 2/24.   Assessment and Plan: * Symptomatic anemia Severe acute on chronic anemia Suspect secondary to upper GI bleed. Hemodynamically stable Initial Hbg 6.0.  Transfused 1 unit pRBC's on admission. Repeat Hbg was 6.5.  2nd unit pRBC's transfused. Hbg this AM stable 7.9 >> 8.3 this afternoon. Anemia panel - no iron deficiency, normal folate, very mildly low B12 176 (ref range 180-914) --GI consulted  --EGD this AM was unremarkable --Trend Hbg and transfuse if < 8.0 given CAD hx - Protonix gtt.   Positive blood culture Single bottle of admission blood cultures growing GPR's. No fevers or leukocytosis. --Follow initial Bcx to final --Repeat Bcx's --UA looked benign - will d/c antibiotics and monitor clinically  Elevated lactic acid level Most likely due to GI bleeding and severe anemia. Unlikely infectious etiology. Lactate trend: 3.9 >> 2.4 Trend lactate until normalized.  Severe anemia See Symptomatic Anemia  Essential hypertension Continue Imdur, Toprol-XL,  nifedipine  Dementia (HCC) No behavioral disturbances reported. - Continue Namenda and Aricept  Constipation Stool softeners PRN  Hypothyroidism Continue levothyroxine   Severe obstructive sleep apnea CPAP nightly ordered  Arteriosclerosis of coronary artery Stable. No acute issues. Continue metoprolol        Subjective: Roger Cook sleeping comfortably when seen this AM.  He woke briefly to voice.  Said he feels okay, nothing bothering him.   No acute events reported.    Physical Exam: Vitals:   11/15/22 1228 11/15/22 1246 11/15/22 1256 11/15/22 1306  BP: (!) 145/48 (!) 144/49  (!) 142/70  Pulse: 70 67    Resp: '18 16 11 '$ (!) 9  Temp: (!) 97 F (36.1 C) (!) 97 F (36.1 C)    TempSrc: Temporal Temporal    SpO2: 94% 95%    Weight: 66.7 kg     Height: '5\' 6"'$  (1.676 m)      General exam: sleeping comfortably, woke briefly to voice, no acute distress HEENT: atraumatic, moist mucus membranes, hearing grossly normal  Respiratory system: CTAB, no wheezes, rales or rhonchi, normal respiratory effort. Cardiovascular system: normal S1/S2, RRR, no pedal edema.   Gastrointestinal system: soft, NT, ND Central nervous system: limited exam due to somnolence, no gross focal neurologic deficits, normal speech Extremities: moves all, no edema, normal tone Skin: dry, intact, normal temperature Psychiatry: limited exam due to somnolence,    Data Reviewed:  Notable labs ---- Hbg trend 6.0 >> 6.5 >> 8.1 >> 7.6 >> 8.2 >> 7.9 >> 8.3  BMP notable for bicarb 21, Cr 1.42 stable, Ca 8.2 Lactic acid normalized 0.9  Family Communication: Updated daughter by phone this afternoon 2/24.  Disposition: Status is: Inpatient Remains inpatient appropriate because: ongoing evaluation of anemia that required transfusions 2/23.   D/C will be pending GI clearance and stable Hbg x 48 at least hours.   Planned Discharge Destination: Home    Time spent: 36 minutes  Author: Ezekiel Slocumb,  DO 11/15/2022 2:58 PM  For on call review [email or web address].

## 2022-11-15 NOTE — Transfer of Care (Addendum)
Immediate Anesthesia Transfer of Care Note  Patient: AMIIR WOODING  Procedure(s) Performed: ESOPHAGOGASTRODUODENOSCOPY (EGD) WITH PROPOFOL  Patient Location: PACU and Endoscopy Unit  Anesthesia Type:General  Level of Consciousness: drowsy  Airway & Oxygen Therapy: Patient Spontanous Breathing  Post-op Assessment: Report given to RN and Post -op Vital signs reviewed and stable  Post vital signs: Reviewed and stable  Last Vitals:  Vitals Value Taken Time  BP 142/74 11/15/22 1257  Temp 36.1 C 11/15/22 1246  Pulse 64 11/15/22 1259  Resp 11 11/15/22 1256  SpO2 99 % 11/15/22 1259  Vitals shown include unvalidated device data.  Last Pain:  Vitals:   11/15/22 1256  TempSrc:   PainSc: 0-No pain         Complications: No notable events documented.

## 2022-11-15 NOTE — Plan of Care (Signed)

## 2022-11-15 NOTE — Op Note (Signed)
[facility] Patient Name: Roger Cook Procedure Date: 11/15/2022 12:33 PM MRN: [id number] Account #: [id number] Date of Birth: 01/04/1927 Admit Type: Outpatient Age: 89 Room: [facility] ENDO ROOM 4 Gender: Male Note Status: Finalized Instrument Name: Upper Endoscope [id number] Procedure:             Upper GI endoscopy Indications:           Iron deficiency anemia Providers:             Andrey Farmer MD, MD Medicines:             Monitored Anesthesia Care Complications:         No immediate complications. Procedure:             Pre-Anesthesia Assessment:                        - Prior to the procedure, a History and Physical was                         performed, and patient medications and allergies were                         reviewed. The patient is competent. The risks and                         benefits of the procedure and the sedation options and                         risks were discussed with the patient. All questions                         were answered and informed consent was obtained.                         Patient identification and proposed procedure were                         verified by the physician, the nurse, the                         anesthesiologist, the anesthetist and the technician                         in the endoscopy suite. Mental Status Examination:                         alert and oriented. Airway Examination: normal                         oropharyngeal airway and neck mobility. Respiratory                         Examination: clear to auscultation. CV Examination:                         normal. Prophylactic Antibiotics: The patient does not                         require prophylactic antibiotics. Prior  Anticoagulants: The patient has taken no anticoagulant                         or antiplatelet agents except for aspirin. ASA Grade                         Assessment: III - A patient  with severe systemic                         disease. After reviewing the risks and benefits, the                         patient was deemed in satisfactory condition to                         undergo the procedure. The anesthesia plan was to use                         monitored anesthesia care (MAC). Immediately prior to                         administration of medications, the patient was                         re-assessed for adequacy to receive sedatives. The                         heart rate, respiratory rate, oxygen saturations,                         blood pressure, adequacy of pulmonary ventilation, and                         response to care were monitored throughout the                         procedure. The physical status of the patient was                         re-assessed after the procedure.                        After obtaining informed consent, the endoscope was                         passed under direct vision. Throughout the procedure,                         the patient's blood pressure, pulse, and oxygen                         saturations were monitored continuously. The Endoscope                         was introduced through the mouth, and advanced to the                         second part of duodenum. The upper GI endoscopy was  accomplished without difficulty. The patient tolerated                         the procedure well. Findings:      The examined esophagus was normal.      The entire examined stomach was normal.      The examined duodenum was normal. Impression:            - Normal esophagus.                        - Normal stomach.                        - Normal examined duodenum.                        - No specimens collected. Recommendation:        - Return patient to hospital ward for ongoing care.                        - Resume previous diet.                        - Continue present medications.                         - Given colonoscopy performed 12/2021 with colonic                         avm's and no mass, would recommend maximizing iron                         therapy instead of repeat colonoscopy given advanced                         age and dementia. Procedure Code(s):     --- Professional ---                        [phone], Esophagogastroduodenoscopy, flexible,                         transoral; diagnostic, including collection of                         specimen(s) by brushing or washing, when performed                         (separate procedure) Diagnosis Code(s):     --- Professional ---                        D50.9, Iron deficiency anemia, unspecified CPT copyright 2022 [employer] Association. All rights reserved. The codes documented in this report are preliminary and upon coder review may  be revised to meet current compliance requirements. Andrey Farmer MD, MD 11/15/2022 12:54:10 PM Number of Addenda: 0 Note Initiated On: 11/15/2022 12:33 PM Estimated Blood Loss:  Estimated blood loss: none.      [facility]

## 2022-11-15 NOTE — Assessment & Plan Note (Signed)
Single bottle of admission blood cultures growing GPR's. Suspect contaminant given single bottle. No fevers or leukocytosis. --Follow initial Bcx to final --Repeat Bcx's - pending - neg to date --Appreciate input from Infectious disease --UA looked benign - will d/c antibiotics and monitor clinically

## 2022-11-15 NOTE — Progress Notes (Signed)
PHARMACY - PHYSICIAN COMMUNICATION CRITICAL VALUE ALERT - BLOOD CULTURE IDENTIFICATION (BCID)  Results for orders placed or performed during the hospital encounter of 11/13/22  Resp panel by RT-PCR (RSV, Flu A&B, Covid) Anterior Nasal Swab     Status: None   Collection Time: 11/13/22  1:47 PM   Specimen: Anterior Nasal Swab  Result Value Ref Range Status   SARS Coronavirus 2 by RT PCR NEGATIVE NEGATIVE Final    Comment: (NOTE) SARS-CoV-2 target nucleic acids are NOT DETECTED.  The SARS-CoV-2 RNA is generally detectable in upper respiratory specimens during the acute phase of infection. The lowest concentration of SARS-CoV-2 viral copies this assay can detect is 138 copies/mL. A negative result does not preclude SARS-Cov-2 infection and should not be used as the sole basis for treatment or other patient management decisions. A negative result may occur with  improper specimen collection/handling, submission of specimen other than nasopharyngeal swab, presence of viral mutation(s) within the areas targeted by this assay, and inadequate number of viral copies(<138 copies/mL). A negative result must be combined with clinical observations, patient history, and epidemiological information. The expected result is Negative.  Fact Sheet for Patients:  EntrepreneurPulse.com.au  Fact Sheet for Healthcare Providers:  IncredibleEmployment.be  This test is no t yet approved or cleared by the [place] FDA and  has been authorized for detection and/or diagnosis of SARS-CoV-2 by FDA under an Emergency Use Authorization (EUA). This EUA will remain  in effect (meaning this test can be used) for the duration of the COVID-19 declaration under Section 564(b)(1) of the Act, 21 U.S.C.section 360bbb-3(b)(1), unless the authorization is terminated  or revoked sooner.       Influenza A by PCR NEGATIVE NEGATIVE Final   Influenza B by PCR NEGATIVE NEGATIVE Final     Comment: (NOTE) The Xpert Xpress SARS-CoV-2/FLU/RSV plus assay is intended as an aid in the diagnosis of influenza from Nasopharyngeal swab specimens and should not be used as a sole basis for treatment. Nasal washings and aspirates are unacceptable for Xpert Xpress SARS-CoV-2/FLU/RSV testing.  Fact Sheet for Patients: EntrepreneurPulse.com.au  Fact Sheet for Healthcare Providers: IncredibleEmployment.be  This test is not yet approved or cleared by the [place] FDA and has been authorized for detection and/or diagnosis of SARS-CoV-2 by FDA under an Emergency Use Authorization (EUA). This EUA will remain in effect (meaning this test can be used) for the duration of the COVID-19 declaration under Section 564(b)(1) of the Act, 21 U.S.C. section 360bbb-3(b)(1), unless the authorization is terminated or revoked.     Resp Syncytial Virus by PCR NEGATIVE NEGATIVE Final    Comment: (NOTE) Fact Sheet for Patients: EntrepreneurPulse.com.au  Fact Sheet for Healthcare Providers: IncredibleEmployment.be  This test is not yet approved or cleared by the [place] FDA and has been authorized for detection and/or diagnosis of SARS-CoV-2 by FDA under an Emergency Use Authorization (EUA). This EUA will remain in effect (meaning this test can be used) for the duration of the COVID-19 declaration under Section 564(b)(1) of the Act, 21 U.S.C. section 360bbb-3(b)(1), unless the authorization is terminated or revoked.  Performed at [facility] [address] [place], [place] [address]   Blood Culture (routine x 2)     Status: None (Preliminary result)   Collection Time: 11/13/22  1:47 PM   Specimen: BLOOD  Result Value Ref Range Status   Specimen Description BLOOD BLOOD RIGHT FOREARM  Final   Special Requests   Final    BOTTLES DRAWN AEROBIC AND  ANAEROBIC Blood Culture adequate volume   Culture   Setup Time   Final    GRAM POSITIVE RODS AEROBIC BOTTLE ONLY CRITICAL RESULT CALLED TO, READ BACK BY AND VERIFIED WITH: Linzey Ramser 11/15/2022 AT 0340 SRR Performed at [facility], [address] [place], [place] [address]    Culture GRAM POSITIVE RODS  Final   Report Status PENDING  Incomplete  Blood Culture (routine x 2)     Status: None (Preliminary result)   Collection Time: 11/13/22  2:42 PM   Specimen: BLOOD  Result Value Ref Range Status   Specimen Description BLOOD LEFT ANTECUBITAL  Final   Special Requests   Final    BOTTLES DRAWN AEROBIC AND ANAEROBIC Blood Culture adequate volume   Culture   Final    NO GROWTH < 24 HOURS Performed at [facility] [address] [place], [place] [address]    Report Status PENDING  Incomplete    BCID Results: 1 (aerobic) of 4 bottles w/ GPR.  Pt currently on Ceftriaxone 1 gm q24h for UTI.  Name of provider contacted: Rachael Fee, NP   Changes to prescribed antibiotics required: None  Renda Rolls, PharmD, [facility] 11/15/2022 4:29 AM

## 2022-11-16 ENCOUNTER — Encounter: Payer: Self-pay | Admitting: Internal Medicine

## 2022-11-16 DIAGNOSIS — R7881 Bacteremia: Secondary | ICD-10-CM

## 2022-11-16 DIAGNOSIS — D649 Anemia, unspecified: Secondary | ICD-10-CM | POA: Diagnosis not present

## 2022-11-16 LAB — BASIC METABOLIC PANEL
Anion gap: 9 (ref 5–15)
BUN: 18 mg/dL (ref 8–23)
CO2: 19 mmol/L — ABNORMAL LOW (ref 22–32)
Calcium: 8 mg/dL — ABNORMAL LOW (ref 8.9–10.3)
Chloride: 107 mmol/L (ref 98–111)
Creatinine, Ser: 1.45 mg/dL — ABNORMAL HIGH (ref 0.61–1.24)
GFR, Estimated: 45 mL/min — ABNORMAL LOW (ref >60)
Glucose, Bld: 91 mg/dL (ref 70–99)
Potassium: 3.8 mmol/L (ref 3.5–5.1)
Sodium: 135 mmol/L (ref 135–145)

## 2022-11-16 LAB — CBC
HCT: 28.4 % — ABNORMAL LOW (ref 39.0–52.0)
Hemoglobin: 8.7 g/dL — ABNORMAL LOW (ref 13.0–17.0)
MCH: 24.6 pg — ABNORMAL LOW (ref 26.0–34.0)
MCHC: 30.6 g/dL (ref 30.0–36.0)
MCV: 80.5 fL (ref 80.0–100.0)
Platelets: 173 10*3/uL (ref 150–400)
RBC: 3.53 MIL/uL — ABNORMAL LOW (ref 4.22–5.81)
RDW: 15.7 % — ABNORMAL HIGH (ref 11.5–15.5)
WBC: 6.1 10*3/uL (ref 4.0–10.5)
nRBC: 0 % (ref 0.0–0.2)

## 2022-11-16 LAB — MAGNESIUM: Magnesium: 2.3 mg/dL (ref 1.7–2.4)

## 2022-11-16 NOTE — TOC Initial Note (Signed)
Transition of Care [facility]) - Initial/Assessment Note    Patient Details  Name: Roger Cook MRN: [id number] Date of Birth: 13-Mar-1927  [facility]) CM/SW Contact:    Valente David, RN Phone Number: 11/16/2022, 4:30 PM  Clinical Narrative:                  Patient admitted from home where he lives with wife.  PCP is Dr. Caryn Section, does not have any concern regarding medications.  Advised of recommendation for SNF for short term rehab, state he would prefer to go home.  Bedside nurse confirms that wife would like options for home health instead of SNF, however wife not available at bedside today.   Attempted to call, no answer.  Will have TOC team follow up tomorrow to discuss discharge planning.   Expected Discharge Plan: Skilled Nursing Facility Barriers to Discharge: Continued Medical Work up   Patient Goals and CMS Choice Patient states their goals for this hospitalization and ongoing recovery are:: Discharge home          Expected Discharge Plan and Services     Post Acute Care Choice: [place] arrangements for the past 2 months: Single Family Home                                      Prior Living Arrangements/Services Living arrangements for the past 2 months: Single Family Home Lives with:: Spouse Patient language and need for interpreter reviewed:: Yes Do you feel safe going back to the place where you live?: Yes      Need for Family Participation in Patient Care: Yes (Comment) Care giver support system in place?: Yes (comment)   Criminal Activity/Legal Involvement Pertinent to Current Situation/Hospitalization: No - Comment as needed  Activities of Daily Living Home Assistive Devices/Equipment: None ADL Screening (condition at time of admission) Patient's cognitive ability adequate to safely complete daily activities?: No Is the patient deaf or have difficulty hearing?: Yes Does the patient have difficulty seeing, even when wearing  glasses/contacts?: Yes Does the patient have difficulty concentrating, remembering, or making decisions?: Yes Patient able to express need for assistance with ADLs?: Yes Does the patient have difficulty dressing or bathing?: Yes Independently performs ADLs?: No Does the patient have difficulty walking or climbing stairs?: Yes Weakness of Legs: Both Weakness of Arms/Hands: None  Permission Sought/Granted   Permission granted to share information with : Yes, Verbal Permission Granted              Emotional Assessment Appearance:: Appears younger than stated age Attitude/Demeanor/Rapport: Engaged Affect (typically observed): Calm Orientation: : Oriented to Self, Oriented to Place, Oriented to  Time, Oriented to Situation   Psych Involvement: No (comment)  Admission diagnosis:  Fall, initial encounter [W19.XXXA] Symptomatic anemia [D64.9] Patient Active Problem List   Diagnosis Date Noted   Positive blood culture 11/15/2022   Symptomatic anemia 11/13/2022   AKI (acute kidney injury) ([place]) 07/10/2022   Hyperglycemia 07/09/2022   Iron deficiency anemia due to chronic blood loss    Angiodysplasia of intestinal tract    Severe anemia 12/24/2021   Angina pectoris ([place]) 09/18/2020   Dementia ([place]) 09/18/2020   Renal hematoma, right, subsequent encounter 04/25/2018   Localized osteoporosis of spine 12/23/2017   Constipation 11/06/2016   Bell's palsy 02/26/2015   Nephrogenic adenoma of bladder 123456   Folliculitis 123456   Personal history of methicillin  resistant Staphylococcus aureus 02/26/2015   Fatty tumor 02/26/2015   Syncope and collapse 02/26/2015   Fungal infection of nail 02/26/2015   H/O malignant neoplasm of prostate 02/26/2015   Altered blood in stool 10/18/2012   Arteriosclerosis of coronary artery 01/05/2009   Severe obstructive sleep apnea 01/05/2009   Hypothyroidism 01/05/2009   Essential hypertension 09/22/1998   PCP:  [name],  MD Pharmacy:   [place], [place] - [address] [place] [address] Phone: [phone] Fax: [phone]     Social Determinants of Health (SDOH) Social History: SDOH Screenings   Food Insecurity: No Food Insecurity (11/14/2022)  Housing: Low Risk  (11/14/2022)  Transportation Needs: No Transportation Needs (11/14/2022)  Utilities: Not At Risk (11/14/2022)  Alcohol Screen: Low Risk  (12/23/2021)  Depression (PHQ2-9): Low Risk  (10/13/2022)  Financial Resource Strain: Low Risk  (02/07/2020)  Physical Activity: Sufficiently Active (02/07/2020)  Social Connections: Socially Integrated (02/07/2020)  Stress: No Stress Concern Present (02/07/2020)  Tobacco Use: Medium Risk (11/16/2022)   SDOH Interventions: Housing Interventions: Intervention Not Indicated   Readmission Risk Interventions     No data to display

## 2022-11-16 NOTE — Evaluation (Signed)
Occupational Therapy Evaluation Patient Details Name: Roger Cook MRN: [id number] DOB: 1927/03/07 Today's Date: 11/16/2022   History of Present Illness Pt is a 89 year old male presenting to the ED 11/13/2022 for evaluation after having an unwitnessed fall at home, admitted with Severe acute on chronic anemia; PMH significant for dementia, hypertension, hyperlipidemia, hypothyroid, history of TIA, history of prostate cancer, Bell's palsy   Clinical Impression   Chat reviewed, pt greeted in bed agreeable to OT evaluation. RN ok'ed evaluation. Pt is alert and oriented x4, presents with flat affect and fair safety awareness/awareness of deficits. Increased time required for processing throughout. 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 Pt reports he lives with his wife, no other support available, was previously MOD I-I in ADL, IADL, cooking, cleaning, driving, amb with no AD. Pt presents with deficits in strength, endurance, activity tolerance, balance, cognition all affecting safe and optimal ADL completion. At this time, would recommend discharge to STR to address functional deficits, potential to discharge home with [facility] pending progress on floor, frequent/constant supervision, assist for ADL, however flight up stairs up to pt bedroom, stairs into house are a potential barrier.  OT will continue to follow acutely.      Recommendations for follow up therapy are one component of a multi-disciplinary discharge planning process, led by the attending physician.  Recommendations may be updated based on patient status, additional functional criteria and insurance authorization.   Follow Up Recommendations  Skilled nursing-short term rehab (<3 hours/day)     Assistance Recommended at Discharge Frequent or constant Supervision/Assistance  Patient can return home with the following A little help with walking and/or transfers;A little help with bathing/dressing/bathroom;Assistance with cooking/housework;Direct  supervision/assist for medications management    Functional Status Assessment  Patient has had a recent decline in their functional status and demonstrates the ability to make significant improvements in function in a reasonable and predictable amount of time.  Equipment Recommendations  BSC/3in1;Tub/shower seat    Recommendations for Other Services       Precautions / Restrictions Precautions Precautions: Fall Restrictions Weight Bearing Restrictions: No      Mobility Bed Mobility Overal bed mobility: Needs Assistance Bed Mobility: Supine to Sit     Supine to sit: Min guard, HOB elevated          Transfers Overall transfer level: Needs assistance Equipment used: None, Rolling walker (2 wheels) Transfers: Sit to/from Stand Sit to Stand: Min assist, Min guard           General transfer comment: CGA with use of RW, MIN A no AE (pt reports no AE use at baseline)      Balance Overall balance assessment: Needs assistance Sitting-balance support: Feet supported Sitting balance-Leahy Scale: Good     Standing balance support: During functional activity, Single extremity supported Standing balance-Leahy Scale: Poor                             ADL either performed or assessed with clinical judgement   ADL Overall ADL's : Needs assistance/impaired Eating/Feeding: Sitting;Set up   Grooming: Wash/dry face;Sitting;Supervision/safety               Lower Body Dressing: Maximal assistance;Sitting/lateral leans   Toilet Transfer: Min guard;Minimal assistance;Rolling walker (2 wheels);Cueing for safety;Cueing for sequencing Toilet Transfer Details (indicate cue type and reason): short amb transfer approx 5' to bedside chair; simulated Toileting- Clothing Manipulation and Hygiene: Maximal assistance;Sit to/from stand  Vision Patient Visual Report: No change from baseline       Perception     Praxis      Pertinent Vitals/Pain  Pain Assessment Pain Assessment: No/denies pain     Hand Dominance     Extremity/Trunk Assessment Upper Extremity Assessment Upper Extremity Assessment: Generalized weakness   Lower Extremity Assessment Lower Extremity Assessment: Generalized weakness       Communication Communication Communication: No difficulties   Cognition Arousal/Alertness: Awake/alert Behavior During Therapy: Flat affect Overall Cognitive Status: No family/caregiver present to determine baseline cognitive functioning Area of Impairment: Memory, Following commands, Safety/judgement, Awareness, Problem solving                     Memory: Decreased short-term memory Following Commands: Follows one step commands with increased time Safety/Judgement: Decreased awareness of safety, Decreased awareness of deficits Awareness: Intellectual Problem Solving: Slow processing, Decreased initiation, Difficulty sequencing, Requires verbal cues       General Comments  vitals montiored, appear stable throughout    Exercises Other Exercises Other Exercises: edu re: role of OT, role of rehab, discharge recommendations, home safety , falls prevention, DME use   Shoulder Instructions      Home Living Family/patient expects to be discharged to:: Private residence Living Arrangements: Spouse/significant other Available Help at Discharge: Family;Available 24 hours/day Type of Home: House Home Access: Stairs to enter [employer] of Steps: 15 Entrance Stairs-Rails: Right;Left Home Layout: Two level Alternate Level Stairs-Number of Steps: flight   Bathroom Shower/Tub: Tub/shower unit (pt takes a tub per his report)         Home Equipment: None          Prior Functioning/Environment Prior Level of Function : Patient poor historian/Family not available             Mobility Comments: pt reports amb with no ad, one fall in the backyard which brought him to the ED ADLs Comments: pt  reports MOD I with ADL/IADL        OT Problem List: Decreased strength;Decreased activity tolerance;Impaired balance (sitting and/or standing);Decreased knowledge of use of DME or AE;Decreased safety awareness;Decreased cognition      OT Treatment/Interventions: Self-care/ADL training;Balance training;Therapeutic exercise;Therapeutic activities;Cognitive remediation/compensation;DME and/or AE instruction;Patient/family education    OT Goals(Current goals can be found in the care plan section) Acute Rehab OT Goals Patient Stated Goal: return home to his wife Pamala Hurry OT Goal Formulation: With patient Time For Goal Achievement: 11/30/22 Potential to Achieve Goals: Good ADL Goals Pt Will Perform Grooming: with modified independence;sitting;standing Pt Will Perform Lower Body Dressing: with modified independence;sit to/from stand Pt Will Transfer to Toilet: with modified independence;ambulating Pt Will Perform Toileting - Clothing Manipulation and hygiene: with modified independence;sit to/from stand  OT Frequency: Min 2X/week    Co-evaluation              AM-PAC OT "6 Clicks" Daily Activity     Outcome Measure Help from another person eating meals?: None Help from another person taking care of personal grooming?: None Help from another person toileting, which includes using toliet, bedpan, or urinal?: A Lot Help from another person bathing (including washing, rinsing, drying)?: A Lot Help from another person to put on and taking off regular upper body clothing?: A Little Help from another person to put on and taking off regular lower body clothing?: A Lot 6 Click Score: 17   End of Session Equipment Utilized During Treatment: Rolling walker (2 wheels) Nurse Communication: Mobility status  Activity Tolerance: Patient tolerated treatment well Patient left: in chair;with call bell/phone within reach;with chair alarm set  OT Visit Diagnosis: Unsteadiness on feet (R26.81);Muscle  weakness (generalized) (M62.81);History of falling (Z91.81)                Time: [id number] OT Time Calculation (min): 18 min Charges:  OT General Charges $OT Visit: 1 Visit OT Evaluation $OT Eval Low Complexity: 1 Low  Shanon Payor, OTD OTR/L  11/16/22, 12:51 PM

## 2022-11-16 NOTE — Evaluation (Signed)
Physical Therapy Evaluation Patient Details Name: Roger Cook MRN: [id number] DOB: [birth year] Today's Date: 11/16/2022  History of Present Illness  Pt is a 89 y/o male admitted secondary to an unwitnessed fall at home. All x-ray imaging negative for any acute fractures. Pt also found to be anemic. GI was consulted and completed an upper GI endoscopy. PMH including but not limited to dementia, hypertension, hyperlipidemia, hypothyroid, history of TIA, history of prostate cancer, Bell's palsy.   Clinical Impression  Pt presented supine in bed with HOB elevated, awake and willing to participate in therapy session. Prior to admission, pt reported that he was independent with all functional mobility and ADLs. However, pt with dementia at baseline and unsure of the reliability of this information as there were no family/caregivers present during the evaluation. Additionally, pt reported that he lives with his wife in a two level house with 15 steps to enter his home. He stated that all of his primary living areas were on the second floor of their home. At the time of evaluation, pt required min A for bed mobility and close CGA to stand at EOB. He was only agreeable to take a few side steps bilaterally with CGA from PT for safety. Based on pt's current functional mobility status and given his home environment with multiple flights of stairs to manage, PT recommending that pt d/c to a SNF for short-term rehab prior to returning home with family support to maximize his safety and independence with functional mobility. Pt would continue to benefit from skilled physical therapy services at this time while admitted and after d/c to address the below listed limitations in order to improve overall safety and independence with functional mobility.        Recommendations for follow up therapy are one component of a multi-disciplinary discharge planning process, led by the attending physician.  Recommendations may be  updated based on patient status, additional functional criteria and insurance authorization.  Follow Up Recommendations Skilled nursing-short term rehab (<3 hours/day) Can patient physically be transported by private vehicle: Yes    Assistance Recommended at Discharge Frequent or constant Supervision/Assistance  Patient can return home with the following  A lot of help with walking and/or transfers;A little help with bathing/dressing/bathroom;Assistance with cooking/housework;Direct supervision/assist for medications management;Assist for transportation;Help with stairs or ramp for entrance    Equipment Recommendations Other (comment) (TBD pending further mobility assessment)  Recommendations for Other Services       Functional Status Assessment Patient has had a recent decline in their functional status and demonstrates the ability to make significant improvements in function in a reasonable and predictable amount of time.     Precautions / Restrictions Precautions Precautions: Fall Restrictions Weight Bearing Restrictions: No      Mobility  Bed Mobility Overal bed mobility: Needs Assistance Bed Mobility: Supine to Sit, Sit to Supine     Supine to sit: Min assist Sit to supine: Min guard   General bed mobility comments: increased time and effort, HOB slightly elevated, min A for trunk elevation to achieve an upright sitting position at EOB, no physical assistance needed to return to bed    Transfers Overall transfer level: Needs assistance Equipment used: None Transfers: Sit to/from Stand Sit to Stand: Min guard           General transfer comment: increased time, use of one UE on the bed rail for stability, close CGA for safety    Ambulation/Gait  General Gait Details: pt only agreeable to taking side steps at EOB, taking 3-4 steps bilaterally x1 with PT providing CGA for safety  Stairs            Wheelchair Mobility    Modified  Rankin (Stroke Patients Only)       Balance Overall balance assessment: Needs assistance Sitting-balance support: Feet supported Sitting balance-Leahy Scale: Fair     Standing balance support: During functional activity, Single extremity supported Standing balance-Leahy Scale: Poor                               Pertinent Vitals/Pain Pain Assessment Pain Assessment: No/denies pain    Home Living Family/patient expects to be discharged to:: Private residence Living Arrangements: Spouse/significant other Available Help at Discharge: Family;Available 24 hours/day Type of Home: House Home Access: Stairs to enter Entrance Stairs-Rails: Psychiatric nurse of Steps: 15 Alternate Level Stairs-Number of Steps: flight Home Layout: Two level Home Equipment: None      Prior Function Prior Level of Function : Patient poor historian/Family not available (pt with dementia at baseline, no family/caregivers present. Pt reports being independent with all functional mobility and ADLs)                     Hand Dominance        Extremity/Trunk Assessment   Upper Extremity Assessment Upper Extremity Assessment: Defer to OT evaluation    Lower Extremity Assessment Lower Extremity Assessment: Generalized weakness;Difficult to assess due to impaired cognition       Communication   Communication: No difficulties  Cognition Arousal/Alertness: Awake/alert Behavior During Therapy: Flat affect Overall Cognitive Status: History of cognitive impairments - at baseline Area of Impairment: Memory, Following commands, Safety/judgement, Awareness, Problem solving                     Memory: Decreased short-term memory Following Commands: Follows one step commands with increased time Safety/Judgement: Decreased awareness of deficits, Decreased awareness of safety Awareness: Intellectual Problem Solving: Slow processing, Decreased initiation,  Difficulty sequencing, Requires verbal cues General Comments: no family/caregivers present        General Comments      Exercises     Assessment/Plan    PT Assessment Patient needs continued PT services  PT Problem List Decreased strength;Decreased balance;Decreased mobility;Decreased coordination;Decreased cognition;Decreased knowledge of use of DME;Decreased safety awareness       PT Treatment Interventions DME instruction;Gait training;Stair training;Functional mobility training;Therapeutic activities;Therapeutic exercise;Balance training;Neuromuscular re-education;Patient/family education    PT Goals (Current goals can be found in the Care Plan section)  Acute Rehab PT Goals Patient Stated Goal: "home today" PT Goal Formulation: With patient Time For Goal Achievement: 11/30/22 Potential to Achieve Goals: Good    Frequency Min 2X/week     Co-evaluation               AM-PAC PT "6 Clicks" Mobility  Outcome Measure Help needed turning from your back to your side while in a flat bed without using bedrails?: None Help needed moving from lying on your back to sitting on the side of a flat bed without using bedrails?: A Little Help needed moving to and from a bed to a chair (including a wheelchair)?: A Little Help needed standing up from a chair using your arms (e.g., wheelchair or bedside chair)?: A Little Help needed to walk in hospital room?: A Little Help needed climbing 3-5 steps  with a railing? : A Lot 6 Click Score: 18    End of Session Equipment Utilized During Treatment: Gait belt Activity Tolerance: Patient tolerated treatment well Patient left: in bed;with call bell/phone within reach;with bed alarm set Nurse Communication: Mobility status PT Visit Diagnosis: Unsteadiness on feet (R26.81);Other abnormalities of gait and mobility (R26.89)    Time: [id number] PT Time Calculation (min) (ACUTE ONLY): 18 min   Charges:   PT Evaluation $PT Eval Moderate  Complexity: 1 Mod          Eduard Clos, PT, DPT  Acute Rehabilitation Services Office [place] 11/16/2022, 11:16 AM

## 2022-11-16 NOTE — Progress Notes (Signed)
Progress Note   Patient: Roger Cook [id number] DOB: 01/18/1927 DOA: 11/13/2022     3 DOS: the patient was seen and examined on 11/16/2022   Brief hospital course: [name] is a 89 year old male with history of dementia, hypertension, hyperlipidemia, hypothyroid, history of TIA, history of prostate cancer, Bell's palsy, who presented to the ED on 11/13/2022 for evaluation after having an unwitnessed fall at home.  Initial vitals in the ED showed temperature of 97.4, respiration rate of 24, heart rate of 71, blood pressure 161/53, SpO2 of 100% on room air. Initial labs most notable for anemia with Hbg 6.0, Cr 1.43, Lactic acid 3.9.  Treated in the ED with Protonix 40 mg IV one-time dose, ceftriaxone 2 g IV, LR infusion.  Typed and screened and 1 unit of PRBC were ordered for transfusion.  Admitted to hospitalist service with gastroenterology consulted.  GI plans for EGD once Hbg > 7, hopefully tomorrow 2/24.   Assessment and Plan: * Symptomatic anemia Severe acute on chronic anemia Suspect secondary to upper GI bleed. Hemodynamically stable Initial Hbg 6.0.  Transfused 1 unit pRBC's on admission. Repeat Hbg was 6.5.  2nd unit pRBC's transfused. Hbg stable 7.9 >> 8.3 >> 8.7. Anemia panel - no iron deficiency, normal folate, very mildly low B12 176 (ref range 180-914) --GI consulted  --EGD on 2/24 was unremarkable --Trend Hbg and transfuse if < 8.0 given CAD hx - Protonix gtt.   Positive blood culture Single bottle of admission blood cultures growing GPR's. No fevers or leukocytosis. --Follow initial Bcx to final --Repeat Bcx's --UA looked benign - will d/c antibiotics and monitor clinically  Severe anemia See Symptomatic Anemia  Elevated lactic acid level-resolved as of 11/16/2022 Most likely due to GI bleeding and severe anemia. Unlikely infectious etiology. Lactate trend: 3.9 >> 2.4 >> 0.9  Essential hypertension Continue Imdur, Toprol-XL,  nifedipine  Dementia (HCC) No behavioral disturbances reported. - Continue Namenda and Aricept  Constipation Stool softeners PRN  Hypothyroidism Continue levothyroxine   Severe obstructive sleep apnea CPAP nightly ordered  Arteriosclerosis of coronary artery Stable. No acute issues. Continue metoprolol   PT and OT evaluations for discharge planning.      Subjective: Pt seen while eating breakfast this AM.  He reports feeling well.  Denies having any acute complaints.  No acute events reported.    Physical Exam: Vitals:   11/16/22 0046 11/16/22 0400 11/16/22 0928 11/16/22 1129  BP: (!) 147/56 (!) 159/49 (!) 165/59 (!) 94/46  Pulse: 64 65 71 78  Resp: '20 18 18 18  '$ Temp: 98 F (36.7 C) 97.8 F (36.6 C) 98.9 F (37.2 C) 97.8 F (36.6 C)  TempSrc:  Oral Oral Oral  SpO2: 96% 96% 95% 97%  Weight:      Height:       General exam: awake, alert, no acute distress HEENT: atraumatic, moist mucus membranes, hearing grossly normal  Respiratory system: CTAB, no wheezes, rales or rhonchi, normal respiratory effort. Cardiovascular system: normal S1/S2, RRR, no pedal edema.   Gastrointestinal system: soft, NT, ND Central nervous system: alert, oriented to self, no gross focal neurologic deficits, normal speech Extremities: moves all, no edema, normal tone Skin: dry, intact, normal temperature Psychiatry: normal mood and affect, abnormal judgment and insight due to dementia   Data Reviewed:  Notable labs ---- Hbg trend 6.0 >> 6.5 >> 8.1 >> 7.6 >> 8.2 >> 7.9 >> 8.3 >> 8.7  BMP notable for bicarb 21>>19, Cr 1.45 stable, Ca 8.0  Family Communication: Updated daughter by phone this afternoon 2/24.  Disposition: Status is: Inpatient Remains inpatient appropriate because: ongoing evaluation of anemia that required transfusions 2/23, also positive blood culture with final results pending, repeat blood cultures pending.      Planned Discharge Destination: Home    Time  spent: 38 minutes  Author: Ezekiel Slocumb, DO 11/16/2022 12:19 PM  For on call review [email or web address].

## 2022-11-16 NOTE — Anesthesia Postprocedure Evaluation (Signed)
Anesthesia Post Note  Patient: Roger Cook  Procedure(s) Performed: ESOPHAGOGASTRODUODENOSCOPY (EGD) WITH PROPOFOL  Patient location during evaluation: Endoscopy Anesthesia Type: General Level of consciousness: awake and alert Pain management: pain level controlled Vital Signs Assessment: post-procedure vital signs reviewed and stable Respiratory status: spontaneous breathing, nonlabored ventilation and respiratory function stable Cardiovascular status: blood pressure returned to baseline and stable Postop Assessment: no apparent nausea or vomiting Anesthetic complications: no   No notable events documented.   Last Vitals:  Vitals:   11/16/22 1129 11/16/22 1702  BP: (!) 94/46 (!) 142/51  Pulse: 78 62  Resp: 18 (!) 22  Temp: 36.6 C 36.5 C  SpO2: 97% 96%    Last Pain:  Vitals:   11/16/22 1129  TempSrc: Oral  PainSc:                  [place] Ouch

## 2022-11-17 ENCOUNTER — Encounter: Payer: Self-pay | Admitting: Gastroenterology

## 2022-11-17 DIAGNOSIS — R7881 Bacteremia: Secondary | ICD-10-CM | POA: Diagnosis not present

## 2022-11-17 DIAGNOSIS — R531 Weakness: Secondary | ICD-10-CM

## 2022-11-17 DIAGNOSIS — D649 Anemia, unspecified: Secondary | ICD-10-CM | POA: Diagnosis not present

## 2022-11-17 DIAGNOSIS — E538 Deficiency of other specified B group vitamins: Secondary | ICD-10-CM | POA: Diagnosis present

## 2022-11-17 LAB — CULTURE, BLOOD (ROUTINE X 2): Special Requests: ADEQUATE

## 2022-11-17 LAB — BASIC METABOLIC PANEL
Anion gap: 8 (ref 5–15)
BUN: 20 mg/dL (ref 8–23)
CO2: 21 mmol/L — ABNORMAL LOW (ref 22–32)
Calcium: 8.1 mg/dL — ABNORMAL LOW (ref 8.9–10.3)
Chloride: 107 mmol/L (ref 98–111)
Creatinine, Ser: 1.49 mg/dL — ABNORMAL HIGH (ref 0.61–1.24)
GFR, Estimated: 43 mL/min — ABNORMAL LOW (ref >60)
Glucose, Bld: 100 mg/dL — ABNORMAL HIGH (ref 70–99)
Potassium: 3.7 mmol/L (ref 3.5–5.1)
Sodium: 136 mmol/L (ref 135–145)

## 2022-11-17 LAB — CBC
HCT: 27.3 % — ABNORMAL LOW (ref 39.0–52.0)
Hemoglobin: 8.4 g/dL — ABNORMAL LOW (ref 13.0–17.0)
MCH: 24.6 pg — ABNORMAL LOW (ref 26.0–34.0)
MCHC: 30.8 g/dL (ref 30.0–36.0)
MCV: 80.1 fL (ref 80.0–100.0)
Platelets: 172 10*3/uL (ref 150–400)
RBC: 3.41 MIL/uL — ABNORMAL LOW (ref 4.22–5.81)
RDW: 16.2 % — ABNORMAL HIGH (ref 11.5–15.5)
WBC: 5.9 10*3/uL (ref 4.0–10.5)
nRBC: 0 % (ref 0.0–0.2)

## 2022-11-17 MED ORDER — CYANOCOBALAMIN 1000 MCG/ML IJ SOLN
1000.0000 ug | Freq: Once | INTRAMUSCULAR | Status: AC
  Administered 2022-11-17: 1000 ug via INTRAMUSCULAR
  Filled 2022-11-17: qty 1

## 2022-11-17 NOTE — Progress Notes (Signed)
Progress Note   Patient: Roger Cook [id number] DOB: Aug 23, 1927 DOA: 11/13/2022     4 DOS: the patient was seen and examined on 11/17/2022   Brief hospital course: Roger Cook is a 89 year old male with history of dementia, hypertension, hyperlipidemia, hypothyroid, history of TIA, history of prostate cancer, Bell's palsy, who presented to the ED on 11/13/2022 for evaluation after having an unwitnessed fall at home.  Initial vitals in the ED showed temperature of 97.4, respiration rate of 24, heart rate of 71, blood pressure 161/53, SpO2 of 100% on room air. Initial labs most notable for anemia with Hbg 6.0, Cr 1.43, Lactic acid 3.9.  Treated in the ED with Protonix 40 mg IV one-time dose, ceftriaxone 2 g IV, LR infusion.  Typed and screened and 1 unit of PRBC were ordered for transfusion.  Admitted to hospitalist service with gastroenterology consulted.  GI plans for EGD once Hbg > 7, hopefully tomorrow 2/24.   Assessment and Plan: * Symptomatic anemia Severe acute on chronic anemia Suspect secondary to upper GI bleed. Hemodynamically stable Initial Hbg 6.0.  Transfused 1 unit pRBC's on admission. Repeat Hbg was 6.5.  2nd unit pRBC's transfused. Hbg stable 7.9 >> 8.3 >> 8.7. Anemia panel - no iron deficiency, normal folate, very mildly low B12 176 (ref range 180-914) --GI consulted  --EGD on 2/24 was unremarkable --Trend Hbg and transfuse if < 8.0 given CAD hx - Initially on Protonix drip. -- On Protonix 40 mg IV BID   Positive blood culture Single bottle of admission blood cultures growing GPR's. Suspect contaminant given single bottle. No fevers or leukocytosis. --Follow initial Bcx to final --Repeat Bcx's - pending - neg to date --Appreciate input from Infectious disease --UA looked benign - will d/c antibiotics and monitor clinically  Severe anemia See Symptomatic Anemia  Elevated lactic acid level-resolved as of 11/16/2022 Most likely due to GI bleeding  and severe anemia. Unlikely infectious etiology. Lactate trend: 3.9 >> 2.4 >> 0.9  Essential hypertension Continue Imdur, Toprol-XL, nifedipine  Dementia (HCC) No behavioral disturbances reported. - Continue Namenda and Aricept  Constipation Stool softeners PRN  Hypothyroidism Continue levothyroxine   Severe obstructive sleep apnea CPAP nightly ordered  Arteriosclerosis of coronary artery Stable. No acute issues. Continue metoprolol  Generalized weakness Due to anemia and overall deconditioning PT and OT evaluations -- SNF recommended Fall precautions  Vitamin B12 deficiency Vitamin B12 level 176 this admission. Given IM B12 injection 2/26. Start PO supplement. PCP follow up for monitoring.         Subjective: Pt seen while eating breakfast this AM.  He says he's feeling well.  Denies feeling sick, having pain or other complaints.  Very pleasant. States he'd like to go home soon.    Physical Exam: Vitals:   11/17/22 0000 11/17/22 0400 11/17/22 0753 11/17/22 1148  BP: (!) 125/42 (!) 132/43 (!) 143/48 (!) 118/44  Pulse: 63 67 67 68  Resp: '18 17 18 18  '$ Temp: 98.6 F (37 C) 98.3 F (36.8 C) 98.1 F (36.7 C) 97.9 F (36.6 C)  TempSrc: Oral Oral Oral Oral  SpO2: 95% 95% 96% 95%  Weight:      Height:       General exam: awake, alert, no acute distress HEENT: atraumatic, moist mucus membranes, hearing grossly normal  Respiratory system: CTAB, no wheezes, rales or rhonchi, normal respiratory effort. Cardiovascular system: normal S1/S2, RRR, no pedal edema.   Gastrointestinal system: soft, NT, ND Central nervous system: alert, oriented to  self, no gross focal neurologic deficits, normal speech Extremities: moves all, no edema, normal tone Skin: dry, intact, normal temperature Psychiatry: normal mood and affect, abnormal judgment and insight due to dementia   Data Reviewed:  Notable labs ---- Hbg trend 6.0 >> 6.5 >> 8.1 >> 7.6 >> 8.2 >> 7.9 >> 8.3 >> 8.7  >> 8.4  BMP notable for bicarb 21, glucose 100, Cr 1.49 from 1.45 stable    Family Communication: Updated daughter by phone afternoon 2/24.  Disposition: Status is: Inpatient Remains inpatient appropriate because: ongoing evaluation of anemia that required transfusions 2/23, also positive blood culture with final results pending, repeat blood cultures pending.      Planned Discharge Destination: Home    Time spent: 36 minutes  Author: Ezekiel Slocumb, DO 11/17/2022 12:39 PM  For on call review [email or web address].

## 2022-11-17 NOTE — Consult Note (Signed)
NAME: Roger Cook  DOB: 09-Feb-1927  MRN: [id number]  Date/Time: 11/17/2022 12:03 PM  REQUESTING PROVIDER: Dr. Arbutus Ped Subjective:  REASON FOR CONSULT: corynebacterium bacteremia ? Roger Cook is a 89 y.o. with a history of hypertension, coronary artery disease, hyperlipidemia, prostate cancer status post prostatectomy, chronic renal insufficiency, history of Bell's palsy, hypothyroidism presented to the ED on 11/13/2022 with unwitnessed fall at home.  He was complaining of pain in the left leg.  Patient a month ago had pain in the left leg and had been seen by his PCP. In the ED vitals 97.4 temperature, BP 161/53, heart rate 71, the ED vitals 97.4 temperature, BP 161/53, heart rate 71, respiratory 24 and oxygen sats of 100%. Labs showed a sodium of 138, potassium 4.7, BUN 25, creatinine 1.43, blood glucose 186.  WBC was 7.1, hemoglobin 6 and platelet was 278.  Lactic acid was 3.9.  Respiratory panel PCR was negative.  Blood culture was sent.  X-ray of the left knee showed no fracture.  An x-ray of the chest showed bilateral diffuse interstitial markings.  UA was normal.  Blood culture showed Corynebacterium  in aerobic bottle on the right forearm only the left antecubital blood culture both aerobic and aerobic was negative.I am asked to see patient for the same  Past Medical History:  Diagnosis Date   Anemia    Aortic atherosclerosis ([place])    Bell palsy    Bilateral renal cysts    Bladder tumor    CAD (coronary artery disease)    DOE (dyspnea on exertion)    Elevated lactic acid level 11/13/2022   GERD (gastroesophageal reflux disease)    History of angina    History of penile implant    History of prostate cancer    Hyperlipidemia    Hypertension    Hypothyroidism    Lipoma    MRSA bacteremia 02/2011   Nephrolithiasis    OSA on CPAP    Pneumonia    as a teenager    Past Surgical History:  Procedure Laterality Date   APPENDECTOMY  2005   CARDIAC CATHETERIZATION N/A  08/13/1990   75% pLCx, 75% mLCx, 25% mLAD, 75% D2, 50% right renal artery; Location: [facility]; Surgeon: Doristine Bosworth, MD   CATARACT EXTRACTION  2005   also had a macular hole in 2005   CATARACT EXTRACTION  2008   COLONOSCOPY     2003, 2014   COLONOSCOPY WITH PROPOFOL N/A 12/26/2021   Procedure: COLONOSCOPY WITH PROPOFOL;  Surgeon: Lucilla Lame, MD;  Location: [facility] ENDOSCOPY;  Service: Endoscopy;  Laterality: N/A;   ESOPHAGOGASTRODUODENOSCOPY     2004, 2014   ESOPHAGOGASTRODUODENOSCOPY (EGD) WITH PROPOFOL N/A 12/26/2021   Procedure: ESOPHAGOGASTRODUODENOSCOPY (EGD) WITH PROPOFOL;  Surgeon: Lucilla Lame, MD;  Location: [facility] ENDOSCOPY;  Service: Endoscopy;  Laterality: N/A;   ESOPHAGOGASTRODUODENOSCOPY (EGD) WITH PROPOFOL N/A 11/15/2022   Procedure: ESOPHAGOGASTRODUODENOSCOPY (EGD) WITH PROPOFOL;  Surgeon: Lesly Rubenstein, MD;  Location: [facility] ENDOSCOPY;  Service: Endoscopy;  Laterality: N/A;   HAND SURGERY Left 06/26/2011   Malignancy removed from left hand   [place]   penile inplant  1984   polyp of rectum  2003   [place]   Abdominal, had to have radiation treatment with the procedure   PROSTATE SURGERY     TONSILLECTOMY     TRANSURETHRAL RESECTION OF BLADDER TUMOR N/A 03/12/2021   Procedure: TRANSURETHRAL RESECTION OF BLADDER TUMOR (TURBT);  Surgeon: Abbie Sons, MD;  Location: [facility] ORS;  Service: Urology;  Laterality: N/A;    Social History   Socioeconomic History   Marital status: Married    Spouse name: Pamala Hurry   Number of children: 2   Years of education: Not on file   Highest education level: Bachelor's degree (e.g., BA, AB, BS)  Occupational History   Occupation: retired  Tobacco Use   Smoking status: Former    Packs/day: 1.00    Years: 16.00    Total pack years: 16.00    Types: Cigarettes    Quit date: 1964    Years since quitting: 60.1   Smokeless tobacco: Never  Vaping Use   Vaping Use: Never used   Substance and Sexual Activity   Alcohol use: Not Currently    Alcohol/week: 2.0 standard drinks of alcohol    Types: 2 Glasses of wine per week   Drug use: No   Sexual activity: Yes  Other Topics Concern   Not on file  Social History Narrative   Not on file   Social Determinants of Health   Financial Resource Strain: Low Risk  (02/07/2020)   Overall Financial Resource Strain (CARDIA)    Difficulty of Paying Living Expenses: Not hard at all  Food Insecurity: No Food Insecurity (11/14/2022)   Hunger Vital Sign    Worried About Running Out of Food in the Last Year: Never true    Ran Out of Food in the Last Year: Never true  Transportation Needs: No Transportation Needs (11/14/2022)   PRAPARE - Hydrologist (Medical): No    Lack of Transportation (Non-Medical): No  Physical Activity: Sufficiently Active (02/07/2020)   Exercise Vital Sign    Days of Exercise per Week: 6 days    Minutes of Exercise per Session: 40 min  Stress: No Stress Concern Present (02/07/2020)   [place]    Feeling of Stress : Not at all  Social Connections: [place] (02/07/2020)   Social Connection and Isolation Panel [NHANES]    Frequency of Communication with Friends and Family: More than three times a week    Frequency of Social Gatherings with Friends and Family: Twice a week    Attends Religious Services: More than 4 times per year    Active Member of [employer] or Organizations: Yes    Attends Music therapist: More than 4 times per year    Marital Status: Married  Human resources officer Violence: Not At Risk (11/14/2022)   Humiliation, Afraid, Rape, and Kick questionnaire    Fear of Current or Ex-Partner: No    Emotionally Abused: No    Physically Abused: No    Sexually Abused: No    Family History  Problem Relation Age of Onset   Lung cancer Mother    Heart disease Father    Allergies   Allergen Reactions   Levofloxacin Swelling    lips swelling   Povidone Iodine     Unknown, can not remember   Sulfa Antibiotics     Unknown, can not remember   Benadryl [Diphenhydramine] Rash   I? Current Facility-Administered Medications  Medication Dose Route Frequency Provider Last Rate Last Admin   0.9 %  sodium chloride infusion  10 mL/hr Intravenous Once Cox, Amy N, DO       acetaminophen (TYLENOL) tablet 650 mg  650 mg Oral Q6H PRN Cox, Amy N, DO  Or   acetaminophen (TYLENOL) suppository 650 mg  650 mg Rectal Q6H PRN Cox, Amy N, DO       brimonidine (ALPHAGAN) 0.2 % ophthalmic solution 1 drop  1 drop Left Eye BID Cox, Amy N, DO   1 drop at 11/17/22 1036   donepezil (ARICEPT) tablet 10 mg  10 mg Oral QHS Cox, Amy N, DO   10 mg at 11/16/22 2220   fluticasone (FLONASE) 50 MCG/ACT nasal spray 2 spray  2 spray Each Nare Daily PRN Cox, Amy N, DO       isosorbide mononitrate (IMDUR) 24 hr tablet 60 mg  60 mg Oral Daily Cox, Amy N, DO   60 mg at 11/17/22 0913   levothyroxine (SYNTHROID) tablet 88 mcg  88 mcg Oral QAC breakfast Cox, Amy N, DO   88 mcg at 11/17/22 0509   memantine (NAMENDA) tablet 10 mg  10 mg Oral BID Cox, Amy N, DO   10 mg at 11/17/22 0913   metoprolol succinate (TOPROL-XL) 24 hr tablet 12.5 mg  12.5 mg Oral Daily Cox, Amy N, DO   12.5 mg at 11/17/22 0913   NIFEdipine (PROCARDIA-XL/NIFEDICAL-XL) 24 hr tablet 30 mg  30 mg Oral Daily Cox, Amy N, DO   30 mg at 11/17/22 0913   ondansetron (ZOFRAN) tablet 4 mg  4 mg Oral Q6H PRN Cox, Amy N, DO       Or   ondansetron (ZOFRAN) injection 4 mg  4 mg Intravenous Q6H PRN Cox, Amy N, DO       pantoprazole (PROTONIX) injection 40 mg  40 mg Intravenous Q12H Cox, Amy N, DO   40 mg at 11/17/22 0509   pravastatin (PRAVACHOL) tablet 40 mg  40 mg Oral q1800 Cox, Amy N, DO   40 mg at 11/16/22 1745   senna-docusate (Senokot-S) tablet 1 tablet  1 tablet Oral QHS PRN Cox, Amy N, DO         Abtx:  Anti-infectives (From admission,  onward)    Start     Dose/Rate Route Frequency Ordered Stop   11/13/22 1430  cefTRIAXone (ROCEPHIN) 2 g in sodium chloride 0.9 % 100 mL IVPB  Status:  Discontinued        2 g 200 mL/hr over 30 Minutes Intravenous Every 24 hours 11/13/22 1425 11/15/22 1453       REVIEW OF SYSTEMS:  Pt has been discharged Objective:  VITALS:  BP (!) 118/44 (BP Location: Right Arm)   Pulse 68   Temp 97.9 F (36.6 C) (Oral)   Resp 18   Ht '5\' 6"'$  (1.676 m)   Wt 66.7 kg   SpO2 95%   BMI 23.73 kg/m  Could not see patient as he has been discharged Pertinent Labs Lab Results CBC    Component Value Date/Time   WBC 5.9 11/17/2022 0610   RBC 3.41 (L) 11/17/2022 0610   HGB 8.4 (L) 11/17/2022 0610   HGB 9.9 (L) 03/26/2022 1142   HCT 27.3 (L) 11/17/2022 0610   HCT 31.4 (L) 03/26/2022 1142   PLT 172 11/17/2022 0610   PLT 261 03/26/2022 1142   MCV 80.1 11/17/2022 0610   MCV 87 03/26/2022 1142   MCV 92 10/26/2012 0707   MCH 24.6 (L) 11/17/2022 0610   MCHC 30.8 11/17/2022 0610   RDW 16.2 (H) 11/17/2022 0610   RDW 16.3 (H) 03/26/2022 1142   RDW 13.6 10/26/2012 0707   LYMPHSABS 1.3 11/13/2022 1347   LYMPHSABS 1.5 10/26/2012 0707  MONOABS 0.7 11/13/2022 1347   MONOABS 0.8 10/26/2012 0707   EOSABS 0.1 11/13/2022 1347   EOSABS 0.2 10/26/2012 0707   BASOSABS 0.1 11/13/2022 1347   BASOSABS 0.0 10/26/2012 0707       Latest Ref Rng & Units 11/17/2022    6:10 AM 11/16/2022    4:32 AM 11/15/2022    5:20 AM  CMP  Glucose 70 - 99 mg/dL 100  91  96   BUN 8 - 23 mg/dL '20  18  17   '$ Creatinine 0.61 - 1.24 mg/dL 1.49  1.45  1.42   Sodium 135 - 145 mmol/L 136  135  138   Potassium 3.5 - 5.1 mmol/L 3.7  3.8  3.8   Chloride 98 - 111 mmol/L 107  107  107   CO2 22 - 32 mmol/L '21  19  21   '$ Calcium 8.9 - 10.3 mg/dL 8.1  8.0  8.2       Microbiology: Recent Results (from the past 240 hour(s))  Resp panel by RT-PCR (RSV, Flu A&B, Covid) Anterior Nasal Swab     Status: None   Collection Time: 11/13/22  1:47  PM   Specimen: Anterior Nasal Swab  Result Value Ref Range Status   SARS Coronavirus 2 by RT PCR NEGATIVE NEGATIVE Final    Comment: (NOTE) SARS-CoV-2 target nucleic acids are NOT DETECTED.  The SARS-CoV-2 RNA is generally detectable in upper respiratory specimens during the acute phase of infection. The lowest concentration of SARS-CoV-2 viral copies this assay can detect is 138 copies/mL. A negative result does not preclude SARS-Cov-2 infection and should not be used as the sole basis for treatment or other patient management decisions. A negative result may occur with  improper specimen collection/handling, submission of specimen other than nasopharyngeal swab, presence of viral mutation(s) within the areas targeted by this assay, and inadequate number of viral copies(<138 copies/mL). A negative result must be combined with clinical observations, patient history, and epidemiological information. The expected result is Negative.  Fact Sheet for Patients:  EntrepreneurPulse.com.au  Fact Sheet for Healthcare Providers:  IncredibleEmployment.be  This test is no t yet approved or cleared by the [place] FDA and  has been authorized for detection and/or diagnosis of SARS-CoV-2 by FDA under an Emergency Use Authorization (EUA). This EUA will remain  in effect (meaning this test can be used) for the duration of the COVID-19 declaration under Section 564(b)(1) of the Act, 21 U.S.C.section 360bbb-3(b)(1), unless the authorization is terminated  or revoked sooner.       Influenza A by PCR NEGATIVE NEGATIVE Final   Influenza B by PCR NEGATIVE NEGATIVE Final    Comment: (NOTE) The Xpert Xpress SARS-CoV-2/FLU/RSV plus assay is intended as an aid in the diagnosis of influenza from Nasopharyngeal swab specimens and should not be used as a sole basis for treatment. Nasal washings and aspirates are unacceptable for Xpert Xpress  SARS-CoV-2/FLU/RSV testing.  Fact Sheet for Patients: EntrepreneurPulse.com.au  Fact Sheet for Healthcare Providers: IncredibleEmployment.be  This test is not yet approved or cleared by the [place] FDA and has been authorized for detection and/or diagnosis of SARS-CoV-2 by FDA under an Emergency Use Authorization (EUA). This EUA will remain in effect (meaning this test can be used) for the duration of the COVID-19 declaration under Section 564(b)(1) of the Act, 21 U.S.C. section 360bbb-3(b)(1), unless the authorization is terminated or revoked.     Resp Syncytial Virus by PCR NEGATIVE NEGATIVE Final    Comment: (NOTE)  Fact Sheet for Patients: EntrepreneurPulse.com.au  Fact Sheet for Healthcare Providers: IncredibleEmployment.be  This test is not yet approved or cleared by the [place] FDA and has been authorized for detection and/or diagnosis of SARS-CoV-2 by FDA under an Emergency Use Authorization (EUA). This EUA will remain in effect (meaning this test can be used) for the duration of the COVID-19 declaration under Section 564(b)(1) of the Act, 21 U.S.C. section 360bbb-3(b)(1), unless the authorization is terminated or revoked.  Performed at [facility], [address] [place], [place] [address]   Blood Culture (routine x 2)     Status: Abnormal   Collection Time: 11/13/22  1:47 PM   Specimen: BLOOD RIGHT FOREARM  Result Value Ref Range Status   Specimen Description   Final    BLOOD RIGHT FOREARM Performed at [facility], [place] [address], [place], [place] [address]    Special Requests   Final    BOTTLES DRAWN AEROBIC AND ANAEROBIC Blood Culture adequate volume Performed at [facility], [address] [place], [place] [address]    Culture  Setup Time   Final    GRAM POSITIVE RODS AEROBIC BOTTLE ONLY CRITICAL RESULT CALLED TO, READ BACK BY AND VERIFIED  WITH: Lloyd Huger 11/15/2022 AT 0340 SRR Performed at [facility], [address] [place], [place] [address]    Culture (A)  Final    CORYNEBACTERIUM AMYCOLATUM Standardized susceptibility testing for this organism is not available. Performed at [facility], [place] [address], [place], [place] [address]    Report Status 11/17/2022 FINAL  Final  Blood Culture (routine x 2)     Status: None (Preliminary result)   Collection Time: 11/13/22  2:42 PM   Specimen: BLOOD  Result Value Ref Range Status   Specimen Description BLOOD LEFT ANTECUBITAL  Final   Special Requests   Final    BOTTLES DRAWN AEROBIC AND ANAEROBIC Blood Culture adequate volume   Culture   Final    NO GROWTH 4 DAYS Performed at [facility], [address] [place], [place] [address]    Report Status PENDING  Incomplete  Culture, blood (Routine X 2) w Reflex to ID Panel     Status: None (Preliminary result)   Collection Time: 11/15/22  3:32 PM   Specimen: BLOOD  Result Value Ref Range Status   Specimen Description BLOOD BLOOD RIGHT HAND  Final   Special Requests   Final    BOTTLES DRAWN AEROBIC AND ANAEROBIC Blood Culture adequate volume   Culture   Final    NO GROWTH 2 DAYS Performed at [facility], [address] [place], [place] [address]    Report Status PENDING  Incomplete  Culture, blood (Routine X 2) w Reflex to ID Panel     Status: None (Preliminary result)   Collection Time: 11/15/22  3:32 PM   Specimen: BLOOD  Result Value Ref Range Status   Specimen Description BLOOD BLOOD LEFT HAND  Final   Special Requests   Final    BOTTLES DRAWN AEROBIC ONLY Blood Culture results may not be optimal due to an inadequate volume of blood received in culture bottles   Culture   Final    NO GROWTH 2 DAYS Performed at [facility], [address] [place], [place] [address]    Report Status PENDING  Incomplete    IMAGING RESULTS:  I have personally reviewed the  films  Minimal congestion? Impression/Recommendation Symptomatic  anemia.  Received PRBC.  He has AVMs.  Upper endoscopy done this admission was negative. Low B12   Corynebacterium in 1 out of 4 blood cultures a skin contaminant.  No need to treat  CKD  ___________________________________________________ Discussed with  requesting provider Note:  This document was prepared using Dragon voice recognition software and may include unintentional dictation errors.

## 2022-11-17 NOTE — Assessment & Plan Note (Signed)
Vitamin B12 level 176 this admission. Given IM B12 injection 2/26. Start PO supplement. PCP follow up for monitoring.

## 2022-11-17 NOTE — Progress Notes (Signed)
Occupational Therapy Treatment Patient Details Name: Roger Cook MRN: [id number] DOB: 10-04-1926 Today's Date: 11/17/2022   History of present illness Pt is a 89 year old male presenting to the ED 11/13/2022 for evaluation after having an unwitnessed fall at home, admitted with Severe acute on chronic anemia; PMH significant for dementia, hypertension, hyperlipidemia, hypothyroid, history of TIA, history of prostate cancer, Bell's palsy   OT comments  Pt received semi-reclined in bed; wife in room. Appearing alert; willing to work with OT on grooming and funcitonal mobility. T/f SBA/supervision with RW. See flowsheet below for further details of session. Much improved activity tolerance and balance/mobility this session. Updated d/c recommendation. Pt and wife in agreement. Communicated with PT, RN, MD. Left semi-reclined in bed, wife in room, with all needs in reach. Encouraged pt to sit up in chair or on EOB again this evening for dinner.    Recommendations for follow up therapy are one component of a multi-disciplinary discharge planning process, led by the attending physician.  Recommendations may be updated based on patient status, additional functional criteria and insurance authorization.    Follow Up Recommendations  Home health OT     Assistance Recommended at Discharge Frequent or constant Supervision/Assistance  Patient can return home with the following  A little help with walking and/or transfers;A little help with bathing/dressing/bathroom;Assistance with cooking/housework;Direct supervision/assist for medications management   Equipment Recommendations  BSC/3in1;Tub/shower seat    Recommendations for Other Services      Precautions / Restrictions Precautions Precautions: Fall Restrictions Weight Bearing Restrictions: No       Mobility Bed Mobility Overal bed mobility: Modified Independent Bed Mobility: Supine to Sit     Supine to sit: Supervision (pt using bed  rail. Wife stating she will get a hospital bed for first floor, as pt's bedroom is on the second floor)          Transfers Overall transfer level: Needs assistance Equipment used: Rolling walker (2 wheels) Transfers: Sit to/from Stand Sit to Stand: Supervision           General transfer comment: Pt first t/f sit to stand without AD and reached forward for the countertop (approx 5 feet away) for stability; unsafe. Pt sat back down on EOB at OT instruction. Second t/f sit to stand at [facility] much improved; pt able to stand up straight and walk to the sink with supervision; cues for positioning RW in front of sink and walking into it prior to initiating grooming task.     Balance Overall balance assessment: Needs assistance         Standing balance support: During functional activity, Single extremity supported Standing balance-Leahy Scale: Fair Standing balance comment: Pt able to stand with unilateral support at sink, at times no UE support. Needing RW support with walking.                           ADL either performed or assessed with clinical judgement   ADL Overall ADL's : Needs assistance/impaired     Grooming: Oral care;Supervision/safety;Standing Grooming Details (indicate cue type and reason): standing at sink; unilateral or no UE support at times.               Lower Body Dressing Details (indicate cue type and reason): Pt demonstrated ability to make figure four position today while seated EOB. Anticipate set up   Toilet Transfer Details (indicate cue type and reason): pt able to t/f from  low bed today without assist; anticipate set up from regular toilet to t/f.                Extremity/Trunk Assessment Upper Extremity Assessment Upper Extremity Assessment: Overall WFL for tasks assessed   Lower Extremity Assessment Lower Extremity Assessment: Defer to PT evaluation;Overall WFL for tasks assessed        Vision       Perception      Praxis      Cognition Arousal/Alertness: Awake/alert Behavior During Therapy: [facility] for tasks assessed/performed Overall Cognitive Status: History of cognitive impairments - at baseline Area of Impairment: Memory                     Memory: Decreased short-term memory (pt thinks it is Friday in mid-February (it is actually Monday in late February)) Following Commands: Follows one step commands consistently       General Comments: Wife is present during session. Feels comfortable with d/c home with [facility] services. Pt does not remember the doctor coming in this morning even though there is a note in the chart that doctor was in at 8:20 this morning.        Exercises      Shoulder Instructions       General Comments Pt able to walk with RW in hallway with supervision (gait belt for safety, but not needed), no LOB, one lap around the unit (approx 250f) at slow-moderate speed. Pt relying heavily on RW with UE. Pt not reporting any fatigue or shortness of breath.    Pertinent Vitals/ Pain       Pain Assessment Pain Assessment: No/denies pain  Home Living                                          Prior Functioning/Environment              Frequency  Min 2X/week        Progress Toward Goals  OT Goals(current goals can now be found in the care plan section)  Progress towards OT goals: Progressing toward goals  Acute Rehab OT Goals Patient Stated Goal: Go home OT Goal Formulation: With patient Time For Goal Achievement: 11/30/22 Potential to Achieve Goals: Good ADL Goals Pt Will Perform Grooming: with modified independence;sitting;standing Pt Will Perform Lower Body Dressing: with modified independence;sit to/from stand Pt Will Transfer to Toilet: with modified independence;ambulating Pt Will Perform Toileting - Clothing Manipulation and hygiene: with modified independence;sit to/from stand  Plan Discharge plan needs to be updated     Co-evaluation                 AM-PAC OT "6 Clicks" Daily Activity     Outcome Measure   Help from another person eating meals?: None Help from another person taking care of personal grooming?: None Help from another person toileting, which includes using toliet, bedpan, or urinal?: A Little Help from another person bathing (including washing, rinsing, drying)?: A Lot Help from another person to put on and taking off regular upper body clothing?: None Help from another person to put on and taking off regular lower body clothing?: A Little 6 Click Score: 20    End of Session Equipment Utilized During Treatment: Rolling walker (2 wheels);Gait belt  OT Visit Diagnosis: Unsteadiness on feet (R26.81);Muscle weakness (generalized) (M62.81);History of falling (Z91.81)   Activity Tolerance  Patient tolerated treatment well   Patient Left in bed;with call bell/phone within reach;with bed alarm set;with family/visitor present   Nurse Communication Mobility status        Time: [id number] OT Time Calculation (min): 36 min  Charges: OT General Charges $OT Visit: 1 Visit OT Treatments $Self Care/Home Management : 8-22 mins $Therapeutic Activity: 8-22 mins  Waymon Amato, MS, OTR/L   Vania Rea 11/17/2022, 3:37 PM

## 2022-11-17 NOTE — TOC Progression Note (Signed)
Transition of Care [facility]) - Progression Note    Patient Details  Name: Roger Cook MRN: [id number] Date of Birth: Aug 16, 1927  Transition of Care [facility]) CM/SW Contact  Laurena Slimmer, RN Phone Number: 11/17/2022, 4:21 PM  Clinical Narrative:    Spoke with patient's wife at bedside. She is not agreeable to SNF but is agreeable to SNF. She stated patient's bedroom is upstairs but he can stay on the first floor with a hospital bed. She does not have a prefernce of [place] State agencies.   Referral sent to [facility] from [facility].      Expected Discharge Plan: [place] Barriers to Discharge: Continued Medical Work up  Expected Discharge Plan and Services     Post Acute Care Choice: [place] arrangements for the past 2 months: Single Family Home                                       Social Determinants of Health (SDOH) Interventions SDOH Screenings   Food Insecurity: No Food Insecurity (11/14/2022)  Housing: Low Risk  (11/14/2022)  Transportation Needs: No Transportation Needs (11/14/2022)  Utilities: Not At Risk (11/14/2022)  Alcohol Screen: Low Risk  (12/23/2021)  Depression (PHQ2-9): Low Risk  (10/13/2022)  Financial Resource Strain: Low Risk  (02/07/2020)  Physical Activity: Sufficiently Active (02/07/2020)  Social Connections: Socially Integrated (02/07/2020)  Stress: No Stress Concern Present (02/07/2020)  Tobacco Use: Medium Risk (11/17/2022)    Readmission Risk Interventions     No data to display

## 2022-11-17 NOTE — Assessment & Plan Note (Signed)
Due to anemia and overall deconditioning PT and OT evaluations -- SNF recommended Fall precautions

## 2022-11-17 NOTE — Plan of Care (Signed)
  Problem: Clinical Measurements: Goal: Will remain free from infection Outcome: Progressing   Problem: Clinical Measurements: Goal: Respiratory complications will improve Outcome: Progressing   Problem: Activity: Goal: Risk for activity intolerance will decrease Outcome: Progressing   Problem: Pain Managment: Goal: General experience of comfort will improve Outcome: Progressing   Problem: Safety: Goal: Ability to remain free from injury will improve Outcome: Completed/Met

## 2022-11-17 NOTE — Care Management Important Message (Signed)
Important Message  Patient Details  Name: Roger Cook MRN: [id number] Date of Birth: 1927-02-11   Medicare Important Message Given:  Yes     Dannette Barbara 11/17/2022, 1:54 PM

## 2022-11-18 DIAGNOSIS — E538 Deficiency of other specified B group vitamins: Secondary | ICD-10-CM

## 2022-11-18 DIAGNOSIS — D649 Anemia, unspecified: Secondary | ICD-10-CM | POA: Diagnosis not present

## 2022-11-18 DIAGNOSIS — E039 Hypothyroidism, unspecified: Secondary | ICD-10-CM

## 2022-11-18 LAB — CBC
HCT: 27.7 % — ABNORMAL LOW (ref 39.0–52.0)
Hemoglobin: 8.3 g/dL — ABNORMAL LOW (ref 13.0–17.0)
MCH: 24.3 pg — ABNORMAL LOW (ref 26.0–34.0)
MCHC: 30 g/dL (ref 30.0–36.0)
MCV: 81 fL (ref 80.0–100.0)
Platelets: 175 10*3/uL (ref 150–400)
RBC: 3.42 MIL/uL — ABNORMAL LOW (ref 4.22–5.81)
RDW: 16.3 % — ABNORMAL HIGH (ref 11.5–15.5)
WBC: 6.6 10*3/uL (ref 4.0–10.5)
nRBC: 0 % (ref 0.0–0.2)

## 2022-11-18 LAB — CULTURE, BLOOD (ROUTINE X 2)
Culture: NO GROWTH
Special Requests: ADEQUATE

## 2022-11-18 MED ORDER — VITAMIN B-12 1000 MCG PO TABS: 1000.0000 ug | ORAL_TABLET | Freq: Every day | ORAL | 2 refills | Status: DC

## 2022-11-18 NOTE — Discharge Summary (Signed)
Physician Discharge Summary   Patient: Roger Cook MRN: [id number] DOB: 21-Jan-1927  Admit date:     11/13/2022  Discharge date: 11/18/22  Discharge Physician: Ezekiel Slocumb   PCP: [name], MD   Recommendations at discharge:   Follow up with Primary Care in 1-2 weeks Follow up with Gastroenterology Repeat CBC in 1 week and monitor anemia as needed Follow iron and B12 levels going forward  Discharge Diagnoses: Principal Problem:   Symptomatic anemia Active Problems:   Severe anemia   Positive blood culture   Essential hypertension   Arteriosclerosis of coronary artery   Severe obstructive sleep apnea   Hypothyroidism   Constipation   Dementia (HCC)   Vitamin B12 deficiency   Generalized weakness  Resolved Problems:   Elevated lactic acid level  Hospital Course: Roger Cook is a 89 year old male with history of dementia, hypertension, hyperlipidemia, hypothyroid, history of TIA, history of prostate cancer, Bell's palsy, who presented to the ED on 11/13/2022 for evaluation after having an unwitnessed fall at home.  Initial vitals in the ED showed temperature of 97.4, respiration rate of 24, heart rate of 71, blood pressure 161/53, SpO2 of 100% on room air. Initial labs most notable for anemia with Hbg 6.0, Cr 1.43, Lactic acid 3.9.  Treated in the ED with Protonix 40 mg IV one-time dose, ceftriaxone 2 g IV, LR infusion.  Typed and screened and 1 unit of PRBC were ordered for transfusion.  Admitted to hospitalist service with gastroenterology consulted.   Assessment and Plan: * Symptomatic anemia Severe acute on chronic anemia Suspect secondary to upper GI bleed. Hemodynamically stable Initial Hbg 6.0.  Transfused 1 unit pRBC's on admission. Repeat Hbg was 6.5.  2nd unit pRBC's transfused. Hbg stable 7.9 >> 8.3 >> 8.7. Anemia panel - no iron deficiency, normal folate, very mildly low B12 176 (ref range 180-914) --GI consulted  --EGD on 2/24 was  unremarkable --Trend Hbg and transfuse if < 8.0 given CAD hx - Initially on Protonix drip. -- Treated with IV Protonix  -- No PPI indicated at discharge --Colonoscopy was deferred due to concern for tolerating bowel prep, and C-scope done 12/2021 showed AVM's   Positive blood culture Single bottle of admission blood cultures growing GPR's. Suspect contaminant given single bottle. No fevers or leukocytosis. --Follow initial Bcx to final --Repeat Bcx's - pending - neg to date --Appreciate input from Infectious disease --UA looked benign - will d/c antibiotics and monitor clinically  Severe anemia See Symptomatic Anemia  Elevated lactic acid level-resolved as of 11/16/2022 Most likely due to GI bleeding and severe anemia. Unlikely infectious etiology. Lactate trend: 3.9 >> 2.4 >> 0.9  Essential hypertension Continue Imdur, Toprol-XL, nifedipine  Dementia (HCC) No behavioral disturbances reported. - Continue Namenda and Aricept  Constipation Stool softeners PRN  Hypothyroidism Continue levothyroxine   Severe obstructive sleep apnea CPAP nightly ordered  Arteriosclerosis of coronary artery Stable. No acute issues. Continue metoprolol  Generalized weakness Due to anemia and overall deconditioning PT and OT evaluations -- SNF recommended Fall precautions  Vitamin B12 deficiency Vitamin B12 level 176 this admission. Given IM B12 injection 2/26. Start PO supplement. PCP follow up for monitoring.         Consultants: Gastroenterology Procedures performed: EGD  Disposition: Home health Diet recommendation:  Cardiac diet DISCHARGE MEDICATION: Allergies as of 11/18/2022       Reactions   Levofloxacin Swelling   lips swelling   Povidone Iodine    Unknown, can not  remember   Sulfa Antibiotics    Unknown, can not remember   Benadryl [diphenhydramine] Rash        Medication List     TAKE these medications    ARTIFICIAL TEAR SOLUTION OP Place 1 drop  into both eyes 2 (two) times daily as needed (dry eyes).   aspirin 81 MG tablet Take 81 mg by mouth daily.   brimonidine 0.2 % ophthalmic solution Commonly known as: ALPHAGAN Place 1 drop into the left eye 2 (two) times daily.   CALCIUM 1200 PO Take by mouth.   cyanocobalamin 1000 MCG tablet Commonly known as: VITAMIN B12 Take 1 tablet (1,000 mcg total) by mouth daily.   donepezil 10 MG tablet Commonly known as: ARICEPT TAKE 1 TABLET BY MOUTH AT BEDTIME   fluticasone 50 MCG/ACT nasal spray Commonly known as: FLONASE Place 2 sprays into both nostrils daily as needed for allergies.   isosorbide mononitrate 60 MG 24 hr tablet Commonly known as: IMDUR TAKE ONE TABLET EVERY DAY   ketoconazole 2 % cream Commonly known as: NIZORAL APPLY TO GLANS OF PENIS DAILY   levothyroxine 88 MCG tablet Commonly known as: SYNTHROID TAKE 1 TABLET EVERY DAY ON EMPTY STOMACHWITH A GLASS OF WATER AT LEAST 30-60 MINBEFORE BREAKFAST   memantine 10 MG tablet Commonly known as: NAMENDA TAKE 1 TABLET BY MOUTH TWO TIMES DAILY   metoprolol succinate 25 MG 24 hr tablet Commonly known as: TOPROL-XL Take 12.5 mg by mouth daily.   MULTIPLE VITAMIN PO Take 1 tablet by mouth daily.   NIFEdipine 30 MG 24 hr tablet Commonly known as: PROCARDIA-XL/NIFEDICAL-XL Take 30 mg by mouth daily.   NON FORMULARY CPAP (Free Text) - Historical Medication  As directed  Started 22-Sep-1994 Active   pravastatin 40 MG tablet Commonly known as: PRAVACHOL TAKE 1 TABLET BY MOUTH DAILY   Prevagen 10 MG Caps Generic drug: Apoaequorin Take by mouth.   PreviDent 5000 Dry Mouth 1.1 % Gel dental gel Generic drug: sodium fluoride Place 1 application onto teeth 2 (two) times daily.               Durable Medical Equipment  (From admission, onward)           Start     Ordered   11/18/22 1001  For home use only DME Hospital bed  Once       Question Answer Comment  Length of Need 6 Months   Patient has  (list medical condition): Generalized weakness   The above medical condition requires: Patient requires the ability to reposition frequently   Bed type Semi-electric      11/18/22 1000            Discharge Exam: Filed Weights   11/14/22 0530 11/15/22 1228 11/18/22 0509  Weight: 66.7 kg 66.7 kg 71.2 kg   General exam: awake, alert, no acute distress HEENT: atraumatic, clear conjunctiva, anicteric sclera, moist mucus membranes, hearing grossly normal  Respiratory system: CTAB, no wheezes, rales or rhonchi, normal respiratory effort. Cardiovascular system: normal S1/S2, RRR, no JVD, murmurs, rubs, gallops, no pedal edema.   Gastrointestinal system: soft, NT, ND, no HSM felt, +bowel sounds. Central nervous system: A&O x 2. no gross focal neurologic deficits, normal speech Extremities: moves all, no edema, normal tone Skin: dry, intact, normal temperature Psychiatry: normal mood, congruent affect   Condition at discharge: stable  The results of significant diagnostics from this hospitalization (including imaging, microbiology, ancillary and laboratory) are listed below for reference.  Imaging Studies: DG Knee Complete 4 Views Left  Result Date: 11/13/2022 CLINICAL DATA:  Left knee pain, fall EXAM: LEFT KNEE - COMPLETE 4+ VIEW COMPARISON:  10/13/2022 FINDINGS: No evidence of fracture, dislocation, or joint effusion. Mild medial compartment joint space narrowing. Soft tissues are unremarkable. IMPRESSION: Negative. Electronically Signed   By: Davina Poke D.O.   On: 11/13/2022 15:35   DG Chest Port 1 View  Result Date: 11/13/2022 CLINICAL DATA:  Sepsis EXAM: PORTABLE CHEST 1 VIEW COMPARISON:  03/10/2022 FINDINGS: The heart size and mediastinal contours are within normal limits. Aortic atherosclerosis. Diffusely increased interstitial markings bilaterally. No focal consolidation. No pleural effusion or pneumothorax. The visualized skeletal structures are unremarkable. IMPRESSION:  Diffusely increased interstitial markings bilaterally, which may reflect bronchitic lung changes, edema, or developing atypical/viral infection. Electronically Signed   By: Davina Poke D.O.   On: 11/13/2022 15:34   CT Head Wo Contrast  Result Date: 11/13/2022 CLINICAL DATA:  Mental status change, unknown cause EXAM: CT HEAD WITHOUT CONTRAST TECHNIQUE: Contiguous axial images were obtained from the base of the skull through the vertex without intravenous contrast. RADIATION DOSE REDUCTION: This exam was performed according to the departmental dose-optimization program which includes automated exposure control, adjustment of the mA and/or kV according to patient size and/or use of iterative reconstruction technique. COMPARISON:  07/04/2022 FINDINGS: Brain: No evidence of acute infarction, hemorrhage, hydrocephalus, extra-axial collection or mass lesion/mass effect. Patchy low-density changes within the periventricular and subcortical white matter most compatible with chronic microvascular ischemic change. Moderate diffuse cerebral volume loss. Vascular: Atherosclerotic calcifications involving the large vessels of the skull base. No unexpected hyperdense vessel. Skull: Normal. Negative for fracture or focal lesion. Sinuses/Orbits: Chronic opacification of the frontal sinuses. Prior sinus surgery. Mastoid air cells are clear. Other: None. IMPRESSION: 1. No acute intracranial findings. 2. Chronic microvascular ischemic change and cerebral volume loss. Electronically Signed   By: Davina Poke D.O.   On: 11/13/2022 15:26    Microbiology: Results for orders placed or performed during the hospital encounter of 11/13/22  Resp panel by RT-PCR (RSV, Flu A&B, Covid) Anterior Nasal Swab     Status: None   Collection Time: 11/13/22  1:47 PM   Specimen: Anterior Nasal Swab  Result Value Ref Range Status   SARS Coronavirus 2 by RT PCR NEGATIVE NEGATIVE Final    Comment: (NOTE) SARS-CoV-2 target nucleic acids  are NOT DETECTED.  The SARS-CoV-2 RNA is generally detectable in upper respiratory specimens during the acute phase of infection. The lowest concentration of SARS-CoV-2 viral copies this assay can detect is 138 copies/mL. A negative result does not preclude SARS-Cov-2 infection and should not be used as the sole basis for treatment or other patient management decisions. A negative result may occur with  improper specimen collection/handling, submission of specimen other than nasopharyngeal swab, presence of viral mutation(s) within the areas targeted by this assay, and inadequate number of viral copies(<138 copies/mL). A negative result must be combined with clinical observations, patient history, and epidemiological information. The expected result is Negative.  Fact Sheet for Patients:  EntrepreneurPulse.com.au  Fact Sheet for Healthcare Providers:  IncredibleEmployment.be  This test is no t yet approved or cleared by the [place] FDA and  has been authorized for detection and/or diagnosis of SARS-CoV-2 by FDA under an Emergency Use Authorization (EUA). This EUA will remain  in effect (meaning this test can be used) for the duration of the COVID-19 declaration under Section 564(b)(1) of the Act, 21 U.S.C.section 360bbb-3(b)(1),  unless the authorization is terminated  or revoked sooner.       Influenza A by PCR NEGATIVE NEGATIVE Final   Influenza B by PCR NEGATIVE NEGATIVE Final    Comment: (NOTE) The Xpert Xpress SARS-CoV-2/FLU/RSV plus assay is intended as an aid in the diagnosis of influenza from Nasopharyngeal swab specimens and should not be used as a sole basis for treatment. Nasal washings and aspirates are unacceptable for Xpert Xpress SARS-CoV-2/FLU/RSV testing.  Fact Sheet for Patients: EntrepreneurPulse.com.au  Fact Sheet for Healthcare Providers: IncredibleEmployment.be  This test is  not yet approved or cleared by the [place] FDA and has been authorized for detection and/or diagnosis of SARS-CoV-2 by FDA under an Emergency Use Authorization (EUA). This EUA will remain in effect (meaning this test can be used) for the duration of the COVID-19 declaration under Section 564(b)(1) of the Act, 21 U.S.C. section 360bbb-3(b)(1), unless the authorization is terminated or revoked.     Resp Syncytial Virus by PCR NEGATIVE NEGATIVE Final    Comment: (NOTE) Fact Sheet for Patients: EntrepreneurPulse.com.au  Fact Sheet for Healthcare Providers: IncredibleEmployment.be  This test is not yet approved or cleared by the [place] FDA and has been authorized for detection and/or diagnosis of SARS-CoV-2 by FDA under an Emergency Use Authorization (EUA). This EUA will remain in effect (meaning this test can be used) for the duration of the COVID-19 declaration under Section 564(b)(1) of the Act, 21 U.S.C. section 360bbb-3(b)(1), unless the authorization is terminated or revoked.  Performed at [facility], [address] [place], [place] [address]   Blood Culture (routine x 2)     Status: Abnormal   Collection Time: 11/13/22  1:47 PM   Specimen: BLOOD RIGHT FOREARM  Result Value Ref Range Status   Specimen Description   Final    BLOOD RIGHT FOREARM Performed at [facility], [place] [address], [place], [place] [address]    Special Requests   Final    BOTTLES DRAWN AEROBIC AND ANAEROBIC Blood Culture adequate volume Performed at [facility], [address] [place], [place] [address]    Culture  Setup Time   Final    GRAM POSITIVE RODS AEROBIC BOTTLE ONLY CRITICAL RESULT CALLED TO, READ BACK BY AND VERIFIED WITH: Lloyd Huger 11/15/2022 AT 0340 SRR Performed at [facility], [place] [place], [place] [address]    Culture (A)  Final    CORYNEBACTERIUM AMYCOLATUM Standardized  susceptibility testing for this organism is not available. Performed at [facility], [place] [address] [place], [place] [address]    Report Status 11/17/2022 FINAL  Final  Blood Culture (routine x 2)     Status: None   Collection Time: 11/13/22  2:42 PM   Specimen: BLOOD  Result Value Ref Range Status   Specimen Description BLOOD LEFT ANTECUBITAL  Final   Special Requests   Final    BOTTLES DRAWN AEROBIC AND ANAEROBIC Blood Culture adequate volume   Culture   Final    NO GROWTH 5 DAYS Performed at [facility], [address] [place], [place] [address]    Report Status 11/18/2022 FINAL  Final  Culture, blood (Routine X 2) w Reflex to ID Panel     Status: None (Preliminary result)   Collection Time: 11/15/22  3:32 PM   Specimen: BLOOD  Result Value Ref Range Status   Specimen Description BLOOD BLOOD RIGHT HAND  Final   Special Requests   Final  BOTTLES DRAWN AEROBIC AND ANAEROBIC Blood Culture adequate volume   Culture   Final    NO GROWTH 3 DAYS Performed at [facility], [address] [place], [place] [address]    Report Status PENDING  Incomplete  Culture, blood (Routine X 2) w Reflex to ID Panel     Status: None (Preliminary result)   Collection Time: 11/15/22  3:32 PM   Specimen: BLOOD  Result Value Ref Range Status   Specimen Description BLOOD BLOOD LEFT HAND  Final   Special Requests   Final    BOTTLES DRAWN AEROBIC ONLY Blood Culture results may not be optimal due to an inadequate volume of blood received in culture bottles   Culture   Final    NO GROWTH 3 DAYS Performed at [facility], [address] [place], [place] [address]    Report Status PENDING  Incomplete    Labs: CBC: Recent Labs  Lab 11/13/22 1347 11/14/22 0102 11/14/22 0848 11/15/22 0520 11/15/22 1410 11/16/22 0432 11/17/22 0610 11/18/22 0422  WBC 7.1 6.1  --  7.5  --  6.1 5.9 6.6  NEUTROABS 4.8  --   --   --   --   --   --   --   HGB 6.0* 6.5*   < >  7.9* 8.3* 8.7* 8.4* 8.3*  HCT 20.9* 21.1*   < > 26.1* 27.4* 28.4* 27.3* 27.7*  MCV 81.6 78.7*  --  79.8*  --  80.5 80.1 81.0  PLT 278 203  --  204  --  173 172 175   < > = values in this interval not displayed.   Basic Metabolic Panel: Recent Labs  Lab 11/13/22 1347 11/14/22 0102 11/15/22 0520 11/16/22 0432 11/17/22 0610  NA 138 140 138 135 136  K 4.7 4.0 3.8 3.8 3.7  CL 107 112* 107 107 107  CO2 20* 21* 21* 19* 21*  GLUCOSE 182* 97 96 91 100*  BUN 25* '21 17 18 20  '$ CREATININE 1.43* 1.37* 1.42* 1.45* 1.49*  CALCIUM 8.6* 7.9* 8.2* 8.0* 8.1*  MG  --   --   --  2.3  --    Liver Function Tests: Recent Labs  Lab 11/13/22 1347 11/14/22 0102  AST 23 26  ALT 15 14  ALKPHOS 68 56  BILITOT 0.6 0.7  PROT 6.6 5.7*  ALBUMIN 3.3* 3.0*   CBG: No results for input(s): "GLUCAP" in the last 168 hours.  Discharge time spent: less than 30 minutes.  Signed: Ezekiel Slocumb, DO [facility] 11/18/2022

## 2022-11-18 NOTE — Consult Note (Addendum)
NAME: Roger Cook  DOB: Feb 15, 1927  MRN: [id number]  Date/Time: 11/18/2022 2:01 PM  REQUESTING PROVIDER: Dr. Arbutus Ped Subjective:  REASON FOR CONSULT: corynebacterium bacteremia ? Roger Cook is a 89 y.o. with a history of hypertension, coronary artery disease, hyperlipidemia, prostate cancer status post prostatectomy, chronic renal insufficiency, history of Bell's palsy, hypothyroidism presented to the ED on 11/13/2022 with unwitnessed fall at home.  He was complaining of pain in the left leg.  Patient a month ago had pain in the left leg and had been seen by his PCP. In the ED vitals 97.4 temperature, BP 161/53, heart rate 71, the ED vitals 97.4 temperature, BP 161/53, heart rate 71, respiratory 24 and oxygen sats of 100%. Labs showed a sodium of 138, potassium 4.7, BUN 25, creatinine 1.43, blood glucose 186.  WBC was 7.1, hemoglobin 6 and platelet was 278.  Lactic acid was 3.9.  Respiratory panel PCR was negative.  Blood culture was sent.  X-ray of the left knee showed no fracture.  An x-ray of the chest showed bilateral diffuse interstitial markings.  UA was normal.  Blood culture showed Corynebacterium  in aerobic bottle on the right forearm only the left antecubital blood culture both aerobic and aerobic was negative.I am asked to see patient for the same Pt says he is doing better  Past Medical History:  Diagnosis Date   Anemia    Aortic atherosclerosis ([place])    Bell palsy    Bilateral renal cysts    Bladder tumor    CAD (coronary artery disease)    DOE (dyspnea on exertion)    Elevated lactic acid level 11/13/2022   GERD (gastroesophageal reflux disease)    History of angina    History of penile implant    History of prostate cancer    Hyperlipidemia    Hypertension    Hypothyroidism    Lipoma    MRSA bacteremia 02/2011   Nephrolithiasis    OSA on CPAP    Pneumonia    as a teenager    Past Surgical History:  Procedure Laterality Date   APPENDECTOMY  2005   CARDIAC  CATHETERIZATION N/A 08/13/1990   75% pLCx, 75% mLCx, 25% mLAD, 75% D2, 50% right renal artery; Location: [facility]; Surgeon: Doristine Bosworth, MD   CATARACT EXTRACTION  2005   also had a macular hole in 2005   CATARACT EXTRACTION  2008   COLONOSCOPY     2003, 2014   COLONOSCOPY WITH PROPOFOL N/A 12/26/2021   Procedure: COLONOSCOPY WITH PROPOFOL;  Surgeon: Lucilla Lame, MD;  Location: [facility] END[facility]COPY;  Service: Endoscopy;  Laterality: N/A;   ESOPHAGOGASTRODUODENOSCOPY     2004, 2014   ESOPHAGOGASTRODUODENOSCOPY (EGD) WITH PROPOFOL N/A 12/26/2021   Procedure: ESOPHAGOGASTRODUODENOSCOPY (EGD) WITH PROPOFOL;  Surgeon: Lucilla Lame, MD;  Location: [facility] ENDOSCOPY;  Service: Endoscopy;  Laterality: N/A;   ESOPHAGOGASTRODUODENOSCOPY (EGD) WITH PROPOFOL N/A 11/15/2022   Procedure: ESOPHAGOGASTRODUODENOSCOPY (EGD) WITH PROPOFOL;  Surgeon: Lesly Rubenstein, MD;  Location: [facility] ENDOSCOPY;  Service: Endoscopy;  Laterality: N/A;   HAND SURGERY Left 06/26/2011   Malignancy removed from left hand   [place]   penile inplant  1984   polyp of rectum  2003   [place]   Abdominal, had to have radiation treatment with the procedure   [place] TUMOR N/A 03/12/2021   Procedure: TRANSURETHRAL  RESECTION OF BLADDER TUMOR (TURBT);  Surgeon: Abbie Sons, MD;  Location: [facility] ORS;  Service: Urology;  Laterality: N/A;    Social History   Socioeconomic History   Marital status: Married    Spouse name: Pamala Hurry   Number of children: 2   Years of education: Not on file   Highest education level: Bachelor's degree (e.g., BA, AB, BS)  Occupational History   Occupation: retired  Tobacco Use   Smoking status: Former    Packs/day: 1.00    Years: 16.00    Total pack years: 16.00    Types: Cigarettes    Quit date: 1964    Years since quitting: 60.1   Smokeless tobacco: Never  Vaping Use    Vaping Use: Never used  Substance and Sexual Activity   Alcohol use: Not Currently    Alcohol/week: 2.0 standard drinks of alcohol    Types: 2 Glasses of wine per week   Drug use: No   Sexual activity: Yes  Other Topics Concern   Not on file  Social History Narrative   Not on file   Social Determinants of Health   Financial Resource Strain: Low Risk  (02/07/2020)   Overall Financial Resource Strain (CARDIA)    Difficulty of Paying Living Expenses: Not hard at all  Food Insecurity: No Food Insecurity (11/14/2022)   Hunger Vital Sign    Worried About Running Out of Food in the Last Year: Never true    Ran Out of Food in the Last Year: Never true  Transportation Needs: No Transportation Needs (11/14/2022)   PRAPARE - Hydrologist (Medical): No    Lack of Transportation (Non-Medical): No  Physical Activity: Sufficiently Active (02/07/2020)   Exercise Vital Sign    Days of Exercise per Week: 6 days    Minutes of Exercise per Session: 40 min  Stress: No Stress Concern Present (02/07/2020)   [place]    Feeling of Stress : Not at all  Social Connections: [place] (02/07/2020)   Social Connection and Isolation Panel [NHANES]    Frequency of Communication with Friends and Family: More than three times a week    Frequency of Social Gatherings with Friends and Family: Twice a week    Attends Religious Services: More than 4 times per year    Active Member of [employer] or Organizations: Yes    Attends Music therapist: More than 4 times per year    Marital Status: Married  Human resources officer Violence: Not At Risk (11/14/2022)   Humiliation, Afraid, Rape, and Kick questionnaire    Fear of Current or Ex-Partner: No    Emotionally Abused: No    Physically Abused: No    Sexually Abused: No    Family History  Problem Relation Age of Onset   Lung cancer Mother    Heart disease  Father    Allergies  Allergen Reactions   Levofloxacin Swelling    lips swelling   Povidone Iodine     Unknown, can not remember   Sulfa Antibiotics     Unknown, can not remember   Benadryl [Diphenhydramine] Rash   I? Current Facility-Administered Medications  Medication Dose Route Frequency Provider Last Rate Last Admin   0.9 %  sodium chloride infusion  10 mL/hr Intravenous Once Cox, Amy N, DO       acetaminophen (TYLENOL) tablet 650 mg  650 mg Oral Q6H PRN Cox, Amy N,  DO       Or   acetaminophen (TYLENOL) suppository 650 mg  650 mg Rectal Q6H PRN Cox, Amy N, DO       brimonidine (ALPHAGAN) 0.2 % ophthalmic solution 1 drop  1 drop Left Eye BID Cox, Amy N, DO   1 drop at 11/18/22 1003   donepezil (ARICEPT) tablet 10 mg  10 mg Oral QHS Cox, Amy N, DO   10 mg at 11/17/22 2101   fluticasone (FLONASE) 50 MCG/ACT nasal spray 2 spray  2 spray Each Nare Daily PRN Cox, Amy N, DO       isosorbide mononitrate (IMDUR) 24 hr tablet 60 mg  60 mg Oral Daily Cox, Amy N, DO   60 mg at 11/18/22 1003   levothyroxine (SYNTHROID) tablet 88 mcg  88 mcg Oral QAC breakfast Cox, Amy N, DO   88 mcg at 11/18/22 0514   memantine (NAMENDA) tablet 10 mg  10 mg Oral BID Cox, Amy N, DO   10 mg at 11/18/22 1003   metoprolol succinate (TOPROL-XL) 24 hr tablet 12.5 mg  12.5 mg Oral Daily Cox, Amy N, DO   12.5 mg at 11/18/22 1003   NIFEdipine (PROCARDIA-XL/NIFEDICAL-XL) 24 hr tablet 30 mg  30 mg Oral Daily Cox, Amy N, DO   30 mg at 11/18/22 1010   ondansetron (ZOFRAN) tablet 4 mg  4 mg Oral Q6H PRN Cox, Amy N, DO       Or   ondansetron (ZOFRAN) injection 4 mg  4 mg Intravenous Q6H PRN Cox, Amy N, DO       pantoprazole (PROTONIX) injection 40 mg  40 mg Intravenous Q12H Cox, Amy N, DO   40 mg at 11/18/22 1003   pravastatin (PRAVACHOL) tablet 40 mg  40 mg Oral q1800 Cox, Amy N, DO   40 mg at 11/17/22 1805   senna-docusate (Senokot-S) tablet 1 tablet  1 tablet Oral QHS PRN Cox, Amy N, DO         Abtx:  Anti-infectives  (From admission, onward)    Start     Dose/Rate Route Frequency Ordered Stop   11/13/22 1430  cefTRIAXone (ROCEPHIN) 2 g in sodium chloride 0.9 % 100 mL IVPB  Status:  Discontinued        2 g 200 mL/hr over 30 Minutes Intravenous Every 24 hours 11/13/22 1425 11/15/22 1453       REVIEW OF SYSTEMS:  CONST: No fever or chills or weight loss EYES no diplopia or blindness ENT : no sore throat , or runny nose RESP: no cough or sob Cards: No chest pain or edema GU: No frequency or dysuria GI: No abdominal pain or bleeding per rectum Skin: tears and scabs from falling Heme: easy bruising MS:  weakness, loss of balance Neurolo: falls, dizziness  Psych: no anxiety, depression  Objective:  VITALS:  BP (!) 134/48 (BP Location: Right Arm)   Pulse 61   Temp 97.7 F (36.5 C) (Oral)   Resp 17   Ht '5\' 6"'$  (1.676 m)   Wt 71.2 kg   SpO2 96%   BMI 25.34 kg/m   O/e pt awake , alert looks very young for age Pale Oral cavity- dentition still intact ENT- normal Eyes- PERLA Chest b/l air entry Hss1s2 Abd soft CNS Rt bells residual Skin Tear over left elbow Scab over rt shin Pertinent Labs Lab Results CBC    Component Value Date/Time   WBC 6.6 11/18/2022 0422   RBC 3.42 (L) 11/18/2022  0422   HGB 8.3 (L) 11/18/2022 0422   HGB 9.9 (L) 03/26/2022 1142   HCT 27.7 (L) 11/18/2022 0422   HCT 31.4 (L) 03/26/2022 1142   PLT 175 11/18/2022 0422   PLT 261 03/26/2022 1142   MCV 81.0 11/18/2022 0422   MCV 87 03/26/2022 1142   MCV 92 10/26/2012 0707   MCH 24.3 (L) 11/18/2022 0422   MCHC 30.0 11/18/2022 0422   RDW 16.3 (H) 11/18/2022 0422   RDW 16.3 (H) 03/26/2022 1142   RDW 13.6 10/26/2012 0707   LYMPHSABS 1.3 11/13/2022 1347   LYMPHSABS 1.5 10/26/2012 0707   MONOABS 0.7 11/13/2022 1347   MONOABS 0.8 10/26/2012 0707   EOSABS 0.1 11/13/2022 1347   EOSABS 0.2 10/26/2012 0707   BASOSABS 0.1 11/13/2022 1347   BASOSABS 0.0 10/26/2012 0707       Latest Ref Rng & Units 11/17/2022     6:10 AM 11/16/2022    4:32 AM 11/15/2022    5:20 AM  CMP  Glucose 70 - 99 mg/dL 100  91  96   BUN 8 - 23 mg/dL '20  18  17   '$ Creatinine 0.61 - 1.24 mg/dL 1.49  1.45  1.42   Sodium 135 - 145 mmol/L 136  135  138   Potassium 3.5 - 5.1 mmol/L 3.7  3.8  3.8   Chloride 98 - 111 mmol/L 107  107  107   CO2 22 - 32 mmol/L '21  19  21   '$ Calcium 8.9 - 10.3 mg/dL 8.1  8.0  8.2       Microbiology: Recent Results (from the past 240 hour(s))  Resp panel by RT-PCR (RSV, Flu A&B, Covid) Anterior Nasal Swab     Status: None   Collection Time: 11/13/22  1:47 PM   Specimen: Anterior Nasal Swab  Result Value Ref Range Status   SARS Coronavirus 2 by RT PCR NEGATIVE NEGATIVE Final    Comment: (NOTE) SARS-CoV-2 target nucleic acids are NOT DETECTED.  The SARS-CoV-2 RNA is generally detectable in upper respiratory specimens during the acute phase of infection. The lowest concentration of SARS-CoV-2 viral copies this assay can detect is 138 copies/mL. A negative result does not preclude SARS-Cov-2 infection and should not be used as the sole basis for treatment or other patient management decisions. A negative result may occur with  improper specimen collection/handling, submission of specimen other than nasopharyngeal swab, presence of viral mutation(s) within the areas targeted by this assay, and inadequate number of viral copies(<138 copies/mL). A negative result must be combined with clinical observations, patient history, and epidemiological information. The expected result is Negative.  Fact Sheet for Patients:  EntrepreneurPulse.com.au  Fact Sheet for Healthcare Providers:  IncredibleEmployment.be  This test is no t yet approved or cleared by the [place] FDA and  has been authorized for detection and/or diagnosis of SARS-CoV-2 by FDA under an Emergency Use Authorization (EUA). This EUA will remain  in effect (meaning this test can be used) for the  duration of the COVID-19 declaration under Section 564(b)(1) of the Act, 21 U.S.C.section 360bbb-3(b)(1), unless the authorization is terminated  or revoked sooner.       Influenza A by PCR NEGATIVE NEGATIVE Final   Influenza B by PCR NEGATIVE NEGATIVE Final    Comment: (NOTE) The Xpert Xpress SARS-CoV-2/FLU/RSV plus assay is intended as an aid in the diagnosis of influenza from Nasopharyngeal swab specimens and should not be used as a sole basis for treatment. Nasal washings and  aspirates are unacceptable for Xpert Xpress SARS-CoV-2/FLU/RSV testing.  Fact Sheet for Patients: EntrepreneurPulse.com.au  Fact Sheet for Healthcare Providers: IncredibleEmployment.be  This test is not yet approved or cleared by the [place] FDA and has been authorized for detection and/or diagnosis of SARS-CoV-2 by FDA under an Emergency Use Authorization (EUA). This EUA will remain in effect (meaning this test can be used) for the duration of the COVID-19 declaration under Section 564(b)(1) of the Act, 21 U.S.C. section 360bbb-3(b)(1), unless the authorization is terminated or revoked.     Resp Syncytial Virus by PCR NEGATIVE NEGATIVE Final    Comment: (NOTE) Fact Sheet for Patients: EntrepreneurPulse.com.au  Fact Sheet for Healthcare Providers: IncredibleEmployment.be  This test is not yet approved or cleared by the [place] FDA and has been authorized for detection and/or diagnosis of SARS-CoV-2 by FDA under an Emergency Use Authorization (EUA). This EUA will remain in effect (meaning this test can be used) for the duration of the COVID-19 declaration under Section 564(b)(1) of the Act, 21 U.S.C. section 360bbb-3(b)(1), unless the authorization is terminated or revoked.  Performed at [facility], [address] [place], [place] [address]   Blood Culture (routine x 2)     Status: Abnormal    Collection Time: 11/13/22  1:47 PM   Specimen: BLOOD RIGHT FOREARM  Result Value Ref Range Status   Specimen Description   Final    BLOOD RIGHT FOREARM Performed at [facility], [place] [address] [place], [place] [address]    Special Requests   Final    BOTTLES DRAWN AEROBIC AND ANAEROBIC Blood Culture adequate volume Performed at [facility], [address] [place], [place] [address]    Culture  Setup Time   Final    GRAM POSITIVE RODS AEROBIC BOTTLE ONLY CRITICAL RESULT CALLED TO, READ BACK BY AND VERIFIED WITH: Lloyd Huger 11/15/2022 AT 0340 SRR Performed at [facility], [address] [place], [place] [address]    Culture (A)  Final    CORYNEBACTERIUM AMYCOLATUM Standardized susceptibility testing for this organism is not available. Performed at [facility], [place] [address] [place], [place] [address]    Report Status 11/17/2022 FINAL  Final  Blood Culture (routine x 2)     Status: None   Collection Time: 11/13/22  2:42 PM   Specimen: BLOOD  Result Value Ref Range Status   Specimen Description BLOOD LEFT ANTECUBITAL  Final   Special Requests   Final    BOTTLES DRAWN AEROBIC AND ANAEROBIC Blood Culture adequate volume   Culture   Final    NO GROWTH 5 DAYS Performed at [facility], [address] [place], [place] [address]    Report Status 11/18/2022 FINAL  Final  Culture, blood (Routine X 2) w Reflex to ID Panel     Status: None (Preliminary result)   Collection Time: 11/15/22  3:32 PM   Specimen: BLOOD  Result Value Ref Range Status   Specimen Description BLOOD BLOOD RIGHT HAND  Final   Special Requests   Final    BOTTLES DRAWN AEROBIC AND ANAEROBIC Blood Culture adequate volume   Culture   Final    NO GROWTH 3 DAYS Performed at [facility], [address] [place],  [address]    Report Status PENDING  Incomplete  Culture, blood (Routine X 2) w Reflex to ID Panel     Status: None (Preliminary result)    Collection Time: 11/15/22  3:32 PM  Specimen: BLOOD  Result Value Ref Range Status   Specimen Description BLOOD BLOOD LEFT HAND  Final   Special Requests   Final    BOTTLES DRAWN AEROBIC ONLY Blood Culture results may not be optimal due to an inadequate volume of blood received in culture bottles   Culture   Final    NO GROWTH 3 DAYS Performed at [facility], [address], [place],  [address]    Report Status PENDING  Incomplete    IMAGING RESULTS:  I have personally reviewed the films Minimal congestion?  Impression/Recommendation Symptomatic  anemia.  Received PRBC.  He has AVMs.  Upper endoscopy done this admission was negative.  B12 deficiency need to be replaced   Corynebacterium in 1 out of 4 blood cultures a skin contaminant.  No need to treat  CKD  _CAD_  HTN on metoprolol and nifedipine  Hypothyroidsim- on synthroid  CAD On ismo, pravsatin  On donepezil and memantine _________________________________________________ Discussed with  patient and requesting  ID will sign off- call if needed Note:  This document was prepared using Dragon voice recognition software and may include unintentional dictation errors.

## 2022-11-18 NOTE — TOC Progression Note (Signed)
Transition of Care [facility]) - Progression Note    Patient Details  Name: Roger Cook MRN: [id number] Date of Birth: Jan 22, 1927  Transition of Care [facility]) CM/SW Contact  Laurena Slimmer, RN Phone Number: 11/18/2022, 10:28 AM  Clinical Narrative:    Request for hospital bed sent to [facility].  Message sent to coordinate delivery with his daughter Anderson [place].   Spoke with patient's daughter Anderson [place]. To advised of bed request and delivery  Message sent to Floydene Flock to coordinate [facility] [place] with patient's daughter Anderson [place].    Expected Discharge Plan: [place] Barriers to Discharge: Continued Medical Work up  Expected Discharge Plan and Services     Post Acute Care Choice: [place] arrangements for the past 2 months: Single Family Home                                       Social Determinants of Health (SDOH) Interventions SDOH Screenings   Food Insecurity: No Food Insecurity (11/14/2022)  Housing: Low Risk  (11/14/2022)  Transportation Needs: No Transportation Needs (11/14/2022)  Utilities: Not At Risk (11/14/2022)  Alcohol Screen: Low Risk  (12/23/2021)  Depression (PHQ2-9): Low Risk  (10/13/2022)  Financial Resource Strain: Low Risk  (02/07/2020)  Physical Activity: Sufficiently Active (02/07/2020)  Social Connections: Socially Integrated (02/07/2020)  Stress: No Stress Concern Present (02/07/2020)  Tobacco Use: Medium Risk (11/17/2022)    Readmission Risk Interventions     No data to display

## 2022-11-18 NOTE — Progress Notes (Signed)
Occupational Therapy Treatment Patient Details Name: Roger Cook MRN: [id number] DOB: 11-08-1926 Today's Date: 11/18/2022   History of present illness Pt is a 89 year old male presenting to the ED 11/13/2022 for evaluation after having an unwitnessed fall at home, admitted with Severe acute on chronic anemia; PMH significant for dementia, hypertension, hyperlipidemia, hypothyroid, history of TIA, history of prostate cancer, Bell's palsy   OT comments  Mr. Schieffer demonstrates progress today. He is able to perform grooming, bed mobility, transfers, ambulation, all with Mod I-SUPV. He requires Max A for LB dressing, Min A for toilet transfer. Pt's balance much improved with use of RW. PTA, pt ambulated without AD, provided educ/encouragement to use RW post DC until balance improves and he is back at PLOF. Pt verbalizes understanding/agreement. Recommend HHOT post DC.   Recommendations for follow up therapy are one component of a multi-disciplinary discharge planning process, led by the attending physician.  Recommendations may be updated based on patient status, additional functional criteria and insurance authorization.    Follow Up Recommendations  Home health OT     Assistance Recommended at Discharge Frequent or constant Supervision/Assistance  Patient can return home with the following  A little help with walking and/or transfers;A little help with bathing/dressing/bathroom;Assistance with cooking/housework;Direct supervision/assist for medications management   Equipment Recommendations       Recommendations for Other Services      Precautions / Restrictions Precautions Precautions: Fall Restrictions Weight Bearing Restrictions: No       Mobility Bed Mobility Overal bed mobility: Modified Independent Bed Mobility: Supine to Sit, Sit to Supine     Supine to sit: Modified independent (Device/Increase time) Sit to supine: Modified independent (Device/Increase time)   General  bed mobility comments: increased time and effort, but pt able to perform without assist    Transfers Overall transfer level: Needs assistance Equipment used: Rolling walker (2 wheels) Transfers: Sit to/from Stand Sit to Stand: Supervision                 Balance Overall balance assessment: Needs assistance Sitting-balance support: Feet supported Sitting balance-Leahy Scale: Good     Standing balance support: Reliant on assistive device for balance, Bilateral upper extremity supported Standing balance-Leahy Scale: Fair Standing balance comment: Balance much improved with use of RW                           ADL either performed or assessed with clinical judgement   ADL Overall ADL's : Needs assistance/impaired     Grooming: Oral care;Supervision/safety;Standing Grooming Details (indicate cue type and reason): standing at sink; unilateral or no UE support at times.             Lower Body Dressing: Maximal assistance;Sitting/lateral leans Lower Body Dressing Details (indicate cue type and reason): Able to make figure four position seated EOB but ultimately needed Max A for donning socks Toilet Transfer: Cueing for sequencing;Cueing for safety;Rolling walker (2 wheels);Regular Toilet;Minimal assistance                  Extremity/Trunk Assessment Upper Extremity Assessment Upper Extremity Assessment: Overall WFL for tasks assessed   Lower Extremity Assessment Lower Extremity Assessment: Overall WFL for tasks assessed        Vision       Perception     Praxis      Cognition Arousal/Alertness: Awake/alert Behavior During Therapy: WFL for tasks assessed/performed Overall Cognitive Status: Within Functional Limits for  tasks assessed                                          Exercises Other Exercises Other Exercises: Educ re: use of RW for safety, falls prevention post DC    Shoulder Instructions       General Comments       Pertinent Vitals/ Pain       Pain Assessment Pain Assessment: No/denies pain  Home Living                                          Prior Functioning/Environment              Frequency  Min 2X/week        Progress Toward Goals  OT Goals(current goals can now be found in the care plan section)  Progress towards OT goals: Progressing toward goals  Acute Rehab OT Goals OT Goal Formulation: With patient Time For Goal Achievement: 11/30/22 Potential to Achieve Goals: Good  Plan Discharge plan remains appropriate;Frequency remains appropriate    Co-evaluation                 AM-PAC OT "6 Clicks" Daily Activity     Outcome Measure   Help from another person eating meals?: None Help from another person taking care of personal grooming?: A Little Help from another person toileting, which includes using toliet, bedpan, or urinal?: A Lot Help from another person bathing (including washing, rinsing, drying)?: A Lot Help from another person to put on and taking off regular upper body clothing?: None Help from another person to put on and taking off regular lower body clothing?: A Lot 6 Click Score: 17    End of Session Equipment Utilized During Treatment: Rolling walker (2 wheels)  OT Visit Diagnosis: Unsteadiness on feet (R26.81);Muscle weakness (generalized) (M62.81);History of falling (Z91.81)   Activity Tolerance Patient tolerated treatment well   Patient Left in bed;with call bell/phone within reach;with bed alarm set   Nurse Communication          Time: [id number] OT Time Calculation (min): 13 min  Charges: OT General Charges $OT Visit: 1 Visit OT Treatments $Self Care/Home Management : 8-22 mins Josiah Lobo, PhD, MS, OTR/L 11/18/22, 2:34 PM

## 2022-11-18 NOTE — Progress Notes (Addendum)
Patient has weakness and severe anemia which requires repositioning. Frequent and immediate changes in body position  cannot be achieved with a normal bed.

## 2022-11-18 NOTE — Progress Notes (Signed)
Physical Therapy Treatment Patient Details Name: Roger Cook MRN: [id number] DOB: [birth year] Today's Date: 11/18/2022   History of Present Illness Pt is a 89 year old male presenting to the ED 11/13/2022 for evaluation after having an unwitnessed fall at home, admitted with Severe acute on chronic anemia; PMH significant for dementia, hypertension, hyperlipidemia, hypothyroid, history of TIA, history of prostate cancer, Bell's palsy    PT Comments    Pt agreeable to additional PT session. Able to ambulate in hallway and perform stair training with safe technique and demonstrates ability to enter/exit home. Reports working on getting hospital bed for main floor in order to access environment. Will continue to progress as able.   Recommendations for follow up therapy are one component of a multi-disciplinary discharge planning process, led by the attending physician.  Recommendations may be updated based on patient status, additional functional criteria and insurance authorization.  Follow Up Recommendations  Home health PT Can patient physically be transported by private vehicle: Yes   Assistance Recommended at Discharge Intermittent Supervision/Assistance  Patient can return home with the following A little help with walking and/or transfers;A little help with bathing/dressing/bathroom;Help with stairs or ramp for entrance   Equipment Recommendations  Hospital bed    Recommendations for Other Services       Precautions / Restrictions Precautions Precautions: Fall Restrictions Weight Bearing Restrictions: No     Mobility  Bed Mobility Overal bed mobility: Modified Independent Bed Mobility: Supine to Sit     Supine to sit: Modified independent (Device/Increase time)     General bed mobility comments: safe technique. Left seated at edge of bed with RN in room at end of session    Transfers Overall transfer level: Needs assistance Equipment used: Rolling walker (2  wheels) Transfers: Sit to/from Stand Sit to Stand: Supervision           General transfer comment: cues for pushing off seated surface. Once standing pt with upright posture    Ambulation/Gait Ambulation/Gait assistance: Min guard Gait Distance (Feet): 200 Feet Assistive device: Rolling walker (2 wheels) Gait Pattern/deviations: Step-through pattern       General Gait Details: ambulated in hallway using RW and safe technique. Able to carry conversation with exertion and reciprocal gait pattern.   Stairs Stairs: Yes Stairs assistance: Min guard Stair Management: One rail Right, Step to pattern, Forwards Number of Stairs: 6 General stair comments: up/down with safe technique. Asked if pt would like to repeat 6 stairs and he said no. Walked back to room.   Wheelchair Mobility    Modified Rankin (Stroke Patients Only)       Balance Overall balance assessment: Needs assistance Sitting-balance support: Feet supported Sitting balance-Leahy Scale: Good     Standing balance support: During functional activity, Single extremity supported Standing balance-Leahy Scale: Fair Standing balance comment: pt able to walk a few feet with cga and no AD; however balance improves with B UE on RW                            Cognition Arousal/Alertness: Awake/alert Behavior During Therapy: WFL for tasks assessed/performed Overall Cognitive Status: History of cognitive impairments - at baseline                                 General Comments: poor historian        Exercises Other Exercises Other Exercises:  upon standing, pt with incontient urine in floor. Assisted with cleaning floor and donning new socks.    General Comments        Pertinent Vitals/Pain Pain Assessment Pain Assessment: No/denies pain    Home Living                          Prior Function            PT Goals (current goals can now be found in the care plan  section) Acute Rehab PT Goals Patient Stated Goal: "home today" PT Goal Formulation: With patient Time For Goal Achievement: 11/30/22 Potential to Achieve Goals: Good Progress towards PT goals: Progressing toward goals    Frequency    Min 2X/week      PT Plan Discharge plan needs to be updated    Co-evaluation              AM-PAC PT "6 Clicks" Mobility   Outcome Measure  Help needed turning from your back to your side while in a flat bed without using bedrails?: None Help needed moving from lying on your back to sitting on the side of a flat bed without using bedrails?: None Help needed moving to and from a bed to a chair (including a wheelchair)?: A Little Help needed standing up from a chair using your arms (e.g., wheelchair or bedside chair)?: A Little Help needed to walk in hospital room?: A Little Help needed climbing 3-5 steps with a railing? : A Little 6 Click Score: 20    End of Session Equipment Utilized During Treatment: Gait belt Activity Tolerance: Patient tolerated treatment well Patient left: in bed;with nursing/sitter in room (seated at EOB with RN presen) Nurse Communication: Mobility status PT Visit Diagnosis: Unsteadiness on feet (R26.81);Other abnormalities of gait and mobility (R26.89)     Time: [id number] PT Time Calculation (min) (ACUTE ONLY): 24 min  Charges:  $Gait Training: 8-22 mins $Therapeutic Exercise: 8-22 mins                     Greggory Stallion, PT, DPT, GCS [phone]    Annice Jolly 11/18/2022, 12:30 PM

## 2022-11-19 DIAGNOSIS — D649 Anemia, unspecified: Secondary | ICD-10-CM | POA: Diagnosis not present

## 2022-11-19 MED ORDER — VITAMIN B-12 1000 MCG PO TABS: 1000.0000 ug | ORAL_TABLET | Freq: Every day | ORAL | Status: DC

## 2022-11-19 MED ORDER — CYANOCOBALAMIN 1000 MCG/ML IJ SOLN
1000.0000 ug | Freq: Once | INTRAMUSCULAR | Status: AC
  Administered 2022-11-19: 1000 ug via INTRAMUSCULAR
  Filled 2022-11-19: qty 1

## 2022-11-19 MED ORDER — CYANOCOBALAMIN 1000 MCG PO TABS: 1000.0000 ug | ORAL_TABLET | Freq: Every day | ORAL | Status: DC

## 2022-11-19 NOTE — Discharge Summary (Signed)
Physician Discharge Summary   Patient: Roger Cook MRN: [id number] DOB: [birth year]  Admit date:     11/13/2022  Discharge date: 11/19/22  Discharge Physician: Jennye Boroughs   PCP: [name], MD   Recommendations at discharge:   Follow-up with PCP in 1 to 2 weeks Follow-up with gastroenterologist as needed Repeat CBC in 1 week and monitor iron and B12 levels as an outpatient  Discharge Diagnoses: Principal Problem:   Symptomatic anemia Active Problems:   Severe anemia   Positive blood culture   Essential hypertension   Arteriosclerosis of coronary artery   Severe obstructive sleep apnea   Hypothyroidism   Constipation   Dementia (HCC)   Vitamin B12 deficiency   Generalized weakness  Resolved Problems:   Elevated lactic acid level  Hospital Course: Roger Cook is a 89 year old male with history of dementia, hypertension, hyperlipidemia, hypothyroid, history of TIA, history of prostate cancer, Bell's palsy, who presented to the ED on 11/13/2022 for evaluation after having an unwitnessed fall at home.  Initial vitals in the ED showed temperature of 97.4, respiration rate of 24, heart rate of 71, blood pressure 161/53, SpO2 of 100% on room air. Initial labs most notable for anemia with Hbg 6.0, Cr 1.43, Lactic acid 3.9.  He was admitted to the hospital for severe acute on chronic symptomatic anemia.  He was transfused with 2 units of packed red blood cells.  Gastroenterologist was consulted for evaluation for GI bleeding as a potential cause of his anemia.  He underwent EGD which showed normal esophagus, stomach and duodenum.  There was no evidence of GI bleeding on EGD.  He has vitamin B12 deficiency.  He was given vitamin B12 injection and discharged on oral vitamin B12.   He had positive blood culture.  He was initially treated with IV ceftriaxone.  1 out of 4 blood culture bottles showed Corynebacterium species which was thought to be a contaminant.  His  condition has improved and he is deemed stable for discharge to home today.       Consultants: Gastroenterologist Procedures performed: EGD Disposition: Home health Diet recommendation:  Discharge Diet Orders (From admission, onward)     Start     Ordered   11/18/22 0000  Diet - low sodium heart healthy        11/18/22 1221           Cardiac diet DISCHARGE MEDICATION: Allergies as of 11/19/2022       Reactions   Levofloxacin Swelling   lips swelling   Povidone Iodine    Unknown, can not remember   Sulfa Antibiotics    Unknown, can not remember   Benadryl [diphenhydramine] Rash        Medication List     TAKE these medications    ARTIFICIAL TEAR SOLUTION OP Place 1 drop into both eyes 2 (two) times daily as needed (dry eyes).   aspirin 81 MG tablet Take 81 mg by mouth daily.   brimonidine 0.2 % ophthalmic solution Commonly known as: ALPHAGAN Place 1 drop into the left eye 2 (two) times daily.   CALCIUM 1200 PO Take by mouth.   cyanocobalamin 1000 MCG tablet Commonly known as: VITAMIN B12 Take 1 tablet (1,000 mcg total) by mouth daily.   donepezil 10 MG tablet Commonly known as: ARICEPT TAKE 1 TABLET BY MOUTH AT BEDTIME   fluticasone 50 MCG/ACT nasal spray Commonly known as: FLONASE Place 2 sprays into both nostrils daily as needed for allergies.  isosorbide mononitrate 60 MG 24 hr tablet Commonly known as: IMDUR TAKE ONE TABLET EVERY DAY   ketoconazole 2 % cream Commonly known as: NIZORAL APPLY TO GLANS OF PENIS DAILY   levothyroxine 88 MCG tablet Commonly known as: SYNTHROID TAKE 1 TABLET EVERY DAY ON EMPTY STOMACHWITH A GLASS OF WATER AT LEAST 30-60 MINBEFORE BREAKFAST   memantine 10 MG tablet Commonly known as: NAMENDA TAKE 1 TABLET BY MOUTH TWO TIMES DAILY   metoprolol succinate 25 MG 24 hr tablet Commonly known as: TOPROL-XL Take 12.5 mg by mouth daily.   MULTIPLE VITAMIN PO Take 1 tablet by mouth daily.   NIFEdipine 30 MG  24 hr tablet Commonly known as: PROCARDIA-XL/NIFEDICAL-XL Take 30 mg by mouth daily.   NON FORMULARY CPAP (Free Text) - Historical Medication  As directed  Started 22-Sep-1994 Active   pravastatin 40 MG tablet Commonly known as: PRAVACHOL TAKE 1 TABLET BY MOUTH DAILY   Prevagen 10 MG Caps Generic drug: Apoaequorin Take by mouth.   PreviDent 5000 Dry Mouth 1.1 % Gel dental gel Generic drug: sodium fluoride Place 1 application onto teeth 2 (two) times daily.               Durable Medical Equipment  (From admission, onward)           Start     Ordered   11/18/22 1001  For home use only DME Hospital bed  Once       Question Answer Comment  Length of Need 6 Months   Patient has (list medical condition): Generalized weakness   The above medical condition requires: Patient requires the ability to reposition frequently   Bed type Semi-electric      11/18/22 1000            Discharge Exam: Filed Weights   11/15/22 1228 11/18/22 0509 11/19/22 0405  Weight: 66.7 kg 71.2 kg 71.5 kg   GEN: NAD SKIN: Warm and dry EYES: No pallor or icterus ENT: MMM CV: RRR PULM: CTA B ABD: soft, ND, NT, +BS CNS: AAO x 2 (person and place), non focal EXT: No edema or tenderness   Condition at discharge: good  The results of significant diagnostics from this hospitalization (including imaging, microbiology, ancillary and laboratory) are listed below for reference.   Imaging Studies: DG Knee Complete 4 Views Left  Result Date: 11/13/2022 CLINICAL DATA:  Left knee pain, fall EXAM: LEFT KNEE - COMPLETE 4+ VIEW COMPARISON:  10/13/2022 FINDINGS: No evidence of fracture, dislocation, or joint effusion. Mild medial compartment joint space narrowing. Soft tissues are unremarkable. IMPRESSION: Negative. Electronically Signed   By: Davina Poke D.O.   On: 11/13/2022 15:35   DG Chest Port 1 View  Result Date: 11/13/2022 CLINICAL DATA:  Sepsis EXAM: PORTABLE CHEST 1 VIEW  COMPARISON:  03/10/2022 FINDINGS: The heart size and mediastinal contours are within normal limits. Aortic atherosclerosis. Diffusely increased interstitial markings bilaterally. No focal consolidation. No pleural effusion or pneumothorax. The visualized skeletal structures are unremarkable. IMPRESSION: Diffusely increased interstitial markings bilaterally, which may reflect bronchitic lung changes, edema, or developing atypical/viral infection. Electronically Signed   By: Davina Poke D.O.   On: 11/13/2022 15:34   CT Head Wo Contrast  Result Date: 11/13/2022 CLINICAL DATA:  Mental status change, unknown cause EXAM: CT HEAD WITHOUT CONTRAST TECHNIQUE: Contiguous axial images were obtained from the base of the skull through the vertex without intravenous contrast. RADIATION DOSE REDUCTION: This exam was performed according to the departmental dose-optimization program which  includes automated exposure control, adjustment of the mA and/or kV according to patient size and/or use of iterative reconstruction technique. COMPARISON:  07/04/2022 FINDINGS: Brain: No evidence of acute infarction, hemorrhage, hydrocephalus, extra-axial collection or mass lesion/mass effect. Patchy low-density changes within the periventricular and subcortical white matter most compatible with chronic microvascular ischemic change. Moderate diffuse cerebral volume loss. Vascular: Atherosclerotic calcifications involving the large vessels of the skull base. No unexpected hyperdense vessel. Skull: Normal. Negative for fracture or focal lesion. Sinuses/Orbits: Chronic opacification of the frontal sinuses. Prior sinus surgery. Mastoid air cells are clear. Other: None. IMPRESSION: 1. No acute intracranial findings. 2. Chronic microvascular ischemic change and cerebral volume loss. Electronically Signed   By: Davina Poke D.O.   On: 11/13/2022 15:26    Microbiology: Results for orders placed or performed during the hospital encounter  of 11/13/22  Resp panel by RT-PCR (RSV, Flu A&B, Covid) Anterior Nasal Swab     Status: None   Collection Time: 11/13/22  1:47 PM   Specimen: Anterior Nasal Swab  Result Value Ref Range Status   SARS Coronavirus 2 by RT PCR NEGATIVE NEGATIVE Final    Comment: (NOTE) SARS-CoV-2 target nucleic acids are NOT DETECTED.  The SARS-CoV-2 RNA is generally detectable in upper respiratory specimens during the acute phase of infection. The lowest concentration of SARS-CoV-2 viral copies this assay can detect is 138 copies/mL. A negative result does not preclude SARS-Cov-2 infection and should not be used as the sole basis for treatment or other patient management decisions. A negative result may occur with  improper specimen collection/handling, submission of specimen other than nasopharyngeal swab, presence of viral mutation(s) within the areas targeted by this assay, and inadequate number of viral copies(<138 copies/mL). A negative result must be combined with clinical observations, patient history, and epidemiological information. The expected result is Negative.  Fact Sheet for Patients:  EntrepreneurPulse.com.au  Fact Sheet for Healthcare Providers:  IncredibleEmployment.be  This test is no t yet approved or cleared by the [place] FDA and  has been authorized for detection and/or diagnosis of SARS-CoV-2 by FDA under an Emergency Use Authorization (EUA). This EUA will remain  in effect (meaning this test can be used) for the duration of the COVID-19 declaration under Section 564(b)(1) of the Act, 21 U.S.C.section 360bbb-3(b)(1), unless the authorization is terminated  or revoked sooner.       Influenza A by PCR NEGATIVE NEGATIVE Final   Influenza B by PCR NEGATIVE NEGATIVE Final    Comment: (NOTE) The Xpert Xpress SARS-CoV-2/FLU/RSV plus assay is intended as an aid in the diagnosis of influenza from Nasopharyngeal swab specimens and should  not be used as a sole basis for treatment. Nasal washings and aspirates are unacceptable for Xpert Xpress SARS-CoV-2/FLU/RSV testing.  Fact Sheet for Patients: EntrepreneurPulse.com.au  Fact Sheet for Healthcare Providers: IncredibleEmployment.be  This test is not yet approved or cleared by the [place] FDA and has been authorized for detection and/or diagnosis of SARS-CoV-2 by FDA under an Emergency Use Authorization (EUA). This EUA will remain in effect (meaning this test can be used) for the duration of the COVID-19 declaration under Section 564(b)(1) of the Act, 21 U.S.C. section 360bbb-3(b)(1), unless the authorization is terminated or revoked.     Resp Syncytial Virus by PCR NEGATIVE NEGATIVE Final    Comment: (NOTE) Fact Sheet for Patients: EntrepreneurPulse.com.au  Fact Sheet for Healthcare Providers: IncredibleEmployment.be  This test is not yet approved or cleared by the [place] FDA and has been  authorized for detection and/or diagnosis of SARS-CoV-2 by FDA under an Emergency Use Authorization (EUA). This EUA will remain in effect (meaning this test can be used) for the duration of the COVID-19 declaration under Section 564(b)(1) of the Act, 21 U.S.C. section 360bbb-3(b)(1), unless the authorization is terminated or revoked.  Performed at [facility], [address] [place], [place] [address]   Blood Culture (routine x 2)     Status: Abnormal   Collection Time: 11/13/22  1:47 PM   Specimen: BLOOD RIGHT FOREARM  Result Value Ref Range Status   Specimen Description   Final    BLOOD RIGHT FOREARM Performed at [facility], [place] [address], [place], [place] [address]    Special Requests   Final    BOTTLES DRAWN AEROBIC AND ANAEROBIC Blood Culture adequate volume Performed at [facility], [address] [place], [place] [address]    Culture  Setup Time    Final    GRAM POSITIVE RODS AEROBIC BOTTLE ONLY CRITICAL RESULT CALLED TO, READ BACK BY AND VERIFIED WITH: Lloyd Huger 11/15/2022 AT 0340 SRR Performed at [facility], [address] [place], [place] [address]    Culture (A)  Final    CORYNEBACTERIUM AMYCOLATUM Standardized susceptibility testing for this organism is not available. Performed at [facility], [place] [address], [place], [place] [address]    Report Status 11/17/2022 FINAL  Final  Blood Culture (routine x 2)     Status: None   Collection Time: 11/13/22  2:42 PM   Specimen: BLOOD  Result Value Ref Range Status   Specimen Description BLOOD LEFT ANTECUBITAL  Final   Special Requests   Final    BOTTLES DRAWN AEROBIC AND ANAEROBIC Blood Culture adequate volume   Culture   Final    NO GROWTH 5 DAYS Performed at [facility], [address] [place], [place] [address]    Report Status 11/18/2022 FINAL  Final  Culture, blood (Routine X 2) w Reflex to ID Panel     Status: None (Preliminary result)   Collection Time: 11/15/22  3:32 PM   Specimen: BLOOD  Result Value Ref Range Status   Specimen Description BLOOD BLOOD RIGHT HAND  Final   Special Requests   Final    BOTTLES DRAWN AEROBIC AND ANAEROBIC Blood Culture adequate volume   Culture   Final    NO GROWTH 4 DAYS Performed at [facility], [address] [place], [place] [address]    Report Status PENDING  Incomplete  Culture, blood (Routine X 2) w Reflex to ID Panel     Status: None (Preliminary result)   Collection Time: 11/15/22  3:32 PM   Specimen: BLOOD  Result Value Ref Range Status   Specimen Description BLOOD BLOOD LEFT HAND  Final   Special Requests   Final    BOTTLES DRAWN AEROBIC ONLY Blood Culture results may not be optimal due to an inadequate volume of blood received in culture bottles   Culture   Final    NO GROWTH 4 DAYS Performed at [facility], [address] [place], [place] [address]    Report  Status PENDING  Incomplete    Labs: CBC: Recent Labs  Lab 11/13/22 1347 11/14/22 0102 11/14/22 0848 11/15/22 0520 11/15/22 1410 11/16/22 0432 11/17/22 0610 11/18/22 0422  WBC 7.1 6.1  --  7.5  --  6.1 5.9 6.6  NEUTROABS 4.8  --   --   --   --   --   --   --  HGB 6.0* 6.5*   < > 7.9* 8.3* 8.7* 8.4* 8.3*  HCT 20.9* 21.1*   < > 26.1* 27.4* 28.4* 27.3* 27.7*  MCV 81.6 78.7*  --  79.8*  --  80.5 80.1 81.0  PLT 278 203  --  204  --  173 172 175   < > = values in this interval not displayed.   Basic Metabolic Panel: Recent Labs  Lab 11/13/22 1347 11/14/22 0102 11/15/22 0520 11/16/22 0432 11/17/22 0610  NA 138 140 138 135 136  K 4.7 4.0 3.8 3.8 3.7  CL 107 112* 107 107 107  CO2 20* 21* 21* 19* 21*  GLUCOSE 182* 97 96 91 100*  BUN 25* '21 17 18 20  '$ CREATININE 1.43* 1.37* 1.42* 1.45* 1.49*  CALCIUM 8.6* 7.9* 8.2* 8.0* 8.1*  MG  --   --   --  2.3  --    Liver Function Tests: Recent Labs  Lab 11/13/22 1347 11/14/22 0102  AST 23 26  ALT 15 14  ALKPHOS 68 56  BILITOT 0.6 0.7  PROT 6.6 5.7*  ALBUMIN 3.3* 3.0*   CBG: No results for input(s): "GLUCAP" in the last 168 hours.  Discharge time spent: greater than 30 minutes.  Signed: Jennye Boroughs, MD [facility] 11/19/2022

## 2022-11-19 NOTE — TOC Transition Note (Signed)
Transition of Care [facility]) - CM/SW Discharge Note   Patient Details  Name: Roger Cook MRN: [id number] Date of Birth: 29-Apr-1927  Transition of [facility]) CM/SW Contact:  Laurena Slimmer, RN Phone Number: 11/19/2022, 10:45 AM   Clinical Narrative:    Damaris Schooner with Janett Billow at [facility] to confirm bed was delivered. Bed was delivered at 7:11pm 11/18/2022  Face sheet and medical  necessity forms printed to the floor for to be added to EMS packet.   Spoke with patient's daughter  regarding discharge today, EMS scheduled and bed delivery.   Nurse notified of EMS   TOC signing off.      Barriers to Discharge: Continued Medical Work up   Patient Goals and CMS Choice      Discharge Placement                         Discharge Plan and Services Additional resources added to the After Visit Summary for       Post Acute Care Choice: Home Health                               Social Determinants of Health (SDOH) Interventions SDOH Screenings   Food Insecurity: No Food Insecurity (11/14/2022)  Housing: Low Risk  (11/14/2022)  Transportation Needs: No Transportation Needs (11/14/2022)  Utilities: Not At Risk (11/14/2022)  Alcohol Screen: Low Risk  (12/23/2021)  Depression (PHQ2-9): Low Risk  (10/13/2022)  Financial Resource Strain: Low Risk  (02/07/2020)  Physical Activity: Sufficiently Active (02/07/2020)  Social Connections: Socially Integrated (02/07/2020)  Stress: No Stress Concern Present (02/07/2020)  Tobacco Use: Medium Risk (11/17/2022)     Readmission Risk Interventions     No data to display

## 2022-11-19 NOTE — Progress Notes (Signed)
Patient discharged home via EMS. All belongings with pt.

## 2022-11-20 ENCOUNTER — Telehealth: Payer: Self-pay | Admitting: Family Medicine

## 2022-11-20 ENCOUNTER — Telehealth: Payer: Self-pay | Admitting: *Deleted

## 2022-11-20 DIAGNOSIS — Z7982 Long term (current) use of aspirin: Secondary | ICD-10-CM | POA: Diagnosis not present

## 2022-11-20 DIAGNOSIS — K5900 Constipation, unspecified: Secondary | ICD-10-CM | POA: Diagnosis not present

## 2022-11-20 DIAGNOSIS — Z556 Problems related to health literacy: Secondary | ICD-10-CM | POA: Diagnosis not present

## 2022-11-20 DIAGNOSIS — F02818 Dementia in other diseases classified elsewhere, unspecified severity, with other behavioral disturbance: Secondary | ICD-10-CM | POA: Diagnosis not present

## 2022-11-20 DIAGNOSIS — Z8546 Personal history of malignant neoplasm of prostate: Secondary | ICD-10-CM | POA: Diagnosis not present

## 2022-11-20 DIAGNOSIS — F0390 Unspecified dementia without behavioral disturbance: Secondary | ICD-10-CM | POA: Diagnosis not present

## 2022-11-20 DIAGNOSIS — K219 Gastro-esophageal reflux disease without esophagitis: Secondary | ICD-10-CM | POA: Diagnosis not present

## 2022-11-20 DIAGNOSIS — E785 Hyperlipidemia, unspecified: Secondary | ICD-10-CM | POA: Diagnosis not present

## 2022-11-20 DIAGNOSIS — Z8673 Personal history of transient ischemic attack (TIA), and cerebral infarction without residual deficits: Secondary | ICD-10-CM | POA: Diagnosis not present

## 2022-11-20 DIAGNOSIS — G4733 Obstructive sleep apnea (adult) (pediatric): Secondary | ICD-10-CM | POA: Diagnosis not present

## 2022-11-20 DIAGNOSIS — D62 Acute posthemorrhagic anemia: Secondary | ICD-10-CM | POA: Diagnosis not present

## 2022-11-20 DIAGNOSIS — E039 Hypothyroidism, unspecified: Secondary | ICD-10-CM | POA: Diagnosis not present

## 2022-11-20 DIAGNOSIS — I10 Essential (primary) hypertension: Secondary | ICD-10-CM | POA: Diagnosis not present

## 2022-11-20 DIAGNOSIS — I2510 Atherosclerotic heart disease of native coronary artery without angina pectoris: Secondary | ICD-10-CM | POA: Diagnosis not present

## 2022-11-20 DIAGNOSIS — Z87891 Personal history of nicotine dependence: Secondary | ICD-10-CM | POA: Diagnosis not present

## 2022-11-20 DIAGNOSIS — D509 Iron deficiency anemia, unspecified: Secondary | ICD-10-CM | POA: Diagnosis not present

## 2022-11-20 DIAGNOSIS — E538 Deficiency of other specified B group vitamins: Secondary | ICD-10-CM | POA: Diagnosis not present

## 2022-11-20 DIAGNOSIS — I700 Atherosclerosis of aorta: Secondary | ICD-10-CM | POA: Diagnosis not present

## 2022-11-20 LAB — CULTURE, BLOOD (ROUTINE X 2)
Culture: NO GROWTH
Culture: NO GROWTH
Special Requests: ADEQUATE

## 2022-11-20 NOTE — Transitions of Care (Post Inpatient/ED Visit) (Signed)
   11/20/2022  Name: Roger Cook MRN: [id number] DOB: January 11, 1927  Today's TOC FU Call Status: Today's TOC FU Call Status:: Successful TOC FU Call Competed TOC FU Call Complete Date: 11/20/22  Transition Care Management Follow-up Telephone Call Date of Discharge: 11/19/22 Discharge Facility: [facility] Type of Discharge: Inpatient Admission Primary Inpatient Discharge Diagnosis:: fall/ anemia How have you been since you were released from the hospital?: Better Any questions or concerns?: No  Items Reviewed: Did you receive and understand the discharge instructions provided?: Yes Medications obtained and verified?: No Medications Not Reviewed Reasons:: Other: (patient wife is contacting the pharmacy to get the rest of the meds. She did not know that B12 is over the counter.) Any new allergies since your discharge?: No Dietary orders reviewed?: No Do you have support at home?: Yes People in Home: spouse Name of Support/Comfort Primary Source: [facility] and Equipment/Supplies: [place] Ordered?: Yes Name of [place]:: [place] set up a time to come to your home?: Yes [place] Visit Date: 11/20/22 Any new equipment or medical supplies ordered?: Yes (Bed) Name of Medical supply agency?: [employer] Were you able to get the equipment/medical supplies?: Yes Do you have any questions related to the use of the equipment/supplies?: No  Functional Questionnaire: Do you need assistance with bathing/showering or dressing?: Yes Do you need assistance with meal preparation?: Yes Do you need assistance with eating?: Yes Do you have difficulty maintaining continence: Yes Do you need assistance with getting out of bed/getting out of a chair/moving?: Yes Do you have difficulty managing or taking your medications?: Yes  Folllow up appointments reviewed: PCP Follow-up appointment confirmed?: No MD Provider Line  Number:[phone] Given: No (Wife wants to schedule appointment herself because she has to work with family for transportation) [facility] Follow-up appointment confirmed?: NA Do you need transportation to your follow-up appointment?: Yes Transportation Need Intervention Addressed By:: Other: (patient wife is arranging with family) Do you understand care options if your condition(s) worsen?: Yes-patient verbalized understanding  SDOH Interventions Today    Flowsheet Row Most Recent Value  SDOH Interventions   Food Insecurity Interventions Intervention Not Indicated  Housing Interventions Intervention Not Indicated  Transportation Interventions Intervention Not Indicated, Other (Comment)  [Patient wife needs to know appointments in advance to arrange transportation with family]      Interventions Today    Flowsheet Row Most Recent Value  General Interventions   General Interventions Discussed/Reviewed Doctor Visits  [Rn discussed making a f/u appt.]  Doctor Visits Discussed/Reviewed Doctor Visits Discussed  Exercise Interventions   Exercise Discussed/Reviewed Exercise Discussed  [RN discussed PT coming today]  [place] Discussed/Reviewed Other  [RN discussed taking care of the caregiver]  Nutrition Interventions   Nutrition Discussed/Reviewed Nutrition Discussed  [RN discussed taking medications with applesauce or pudding]  Pharmacy Interventions   Pharmacy Dicussed/Reviewed Pharmacy Topics Discussed  [RN discussed that B12 is over the counter. Patient wife was looking for the prescription. Patient wife is conacting the pharmacy for the medications she does not have.]        [place] [facility] [phone]

## 2022-11-20 NOTE — Telephone Encounter (Signed)
Debroah Loop calling from [facility] Requesting order for condom cather and bags. Please advise CB-  [phone]

## 2022-11-21 NOTE — Telephone Encounter (Signed)
That's fine

## 2022-11-21 NOTE — Telephone Encounter (Signed)
Advised 

## 2022-11-24 ENCOUNTER — Telehealth: Payer: Self-pay | Admitting: Family Medicine

## 2022-11-24 DIAGNOSIS — I700 Atherosclerosis of aorta: Secondary | ICD-10-CM | POA: Diagnosis not present

## 2022-11-24 DIAGNOSIS — E039 Hypothyroidism, unspecified: Secondary | ICD-10-CM | POA: Diagnosis not present

## 2022-11-24 DIAGNOSIS — E538 Deficiency of other specified B group vitamins: Secondary | ICD-10-CM | POA: Diagnosis not present

## 2022-11-24 DIAGNOSIS — D509 Iron deficiency anemia, unspecified: Secondary | ICD-10-CM | POA: Diagnosis not present

## 2022-11-24 DIAGNOSIS — E785 Hyperlipidemia, unspecified: Secondary | ICD-10-CM | POA: Diagnosis not present

## 2022-11-24 DIAGNOSIS — I10 Essential (primary) hypertension: Secondary | ICD-10-CM | POA: Diagnosis not present

## 2022-11-24 NOTE — Telephone Encounter (Signed)
Home Health Verbal Orders - Caller/Agency: Gerald Stabs  with [facility] Number: [phone]  PT  1 week 8

## 2022-11-24 NOTE — Telephone Encounter (Signed)
Advised 

## 2022-11-25 ENCOUNTER — Telehealth: Payer: Self-pay | Admitting: Family Medicine

## 2022-11-25 NOTE — Telephone Encounter (Signed)
Called patient to schedule Medicare Annual Wellness Visit (AWV). No voicemail available to leave a message.  Last date of AWV: 12/24/2022  Please schedule an appointment at any time with [facility].  If any questions, please contact me at [phone].  Thank you ,  [place] Direct Dial: [phone]

## 2022-11-27 DIAGNOSIS — D509 Iron deficiency anemia, unspecified: Secondary | ICD-10-CM | POA: Diagnosis not present

## 2022-11-27 DIAGNOSIS — I700 Atherosclerosis of aorta: Secondary | ICD-10-CM | POA: Diagnosis not present

## 2022-11-27 DIAGNOSIS — E538 Deficiency of other specified B group vitamins: Secondary | ICD-10-CM | POA: Diagnosis not present

## 2022-11-27 DIAGNOSIS — E785 Hyperlipidemia, unspecified: Secondary | ICD-10-CM | POA: Diagnosis not present

## 2022-11-27 DIAGNOSIS — I10 Essential (primary) hypertension: Secondary | ICD-10-CM | POA: Diagnosis not present

## 2022-11-27 DIAGNOSIS — E039 Hypothyroidism, unspecified: Secondary | ICD-10-CM | POA: Diagnosis not present

## 2022-11-28 DIAGNOSIS — E538 Deficiency of other specified B group vitamins: Secondary | ICD-10-CM | POA: Diagnosis not present

## 2022-11-28 DIAGNOSIS — E039 Hypothyroidism, unspecified: Secondary | ICD-10-CM | POA: Diagnosis not present

## 2022-11-28 DIAGNOSIS — I10 Essential (primary) hypertension: Secondary | ICD-10-CM | POA: Diagnosis not present

## 2022-11-28 DIAGNOSIS — E785 Hyperlipidemia, unspecified: Secondary | ICD-10-CM | POA: Diagnosis not present

## 2022-11-28 DIAGNOSIS — D509 Iron deficiency anemia, unspecified: Secondary | ICD-10-CM | POA: Diagnosis not present

## 2022-11-28 DIAGNOSIS — I700 Atherosclerosis of aorta: Secondary | ICD-10-CM | POA: Diagnosis not present

## 2022-12-03 DIAGNOSIS — I10 Essential (primary) hypertension: Secondary | ICD-10-CM | POA: Diagnosis not present

## 2022-12-03 DIAGNOSIS — D509 Iron deficiency anemia, unspecified: Secondary | ICD-10-CM | POA: Diagnosis not present

## 2022-12-03 DIAGNOSIS — E538 Deficiency of other specified B group vitamins: Secondary | ICD-10-CM | POA: Diagnosis not present

## 2022-12-03 DIAGNOSIS — E039 Hypothyroidism, unspecified: Secondary | ICD-10-CM | POA: Diagnosis not present

## 2022-12-03 DIAGNOSIS — I700 Atherosclerosis of aorta: Secondary | ICD-10-CM | POA: Diagnosis not present

## 2022-12-03 DIAGNOSIS — E785 Hyperlipidemia, unspecified: Secondary | ICD-10-CM | POA: Diagnosis not present

## 2022-12-04 ENCOUNTER — Telehealth: Payer: Self-pay | Admitting: Family Medicine

## 2022-12-04 DIAGNOSIS — E538 Deficiency of other specified B group vitamins: Secondary | ICD-10-CM | POA: Diagnosis not present

## 2022-12-04 DIAGNOSIS — E039 Hypothyroidism, unspecified: Secondary | ICD-10-CM | POA: Diagnosis not present

## 2022-12-04 DIAGNOSIS — E785 Hyperlipidemia, unspecified: Secondary | ICD-10-CM | POA: Diagnosis not present

## 2022-12-04 DIAGNOSIS — I10 Essential (primary) hypertension: Secondary | ICD-10-CM | POA: Diagnosis not present

## 2022-12-04 DIAGNOSIS — I700 Atherosclerosis of aorta: Secondary | ICD-10-CM | POA: Diagnosis not present

## 2022-12-04 DIAGNOSIS — D509 Iron deficiency anemia, unspecified: Secondary | ICD-10-CM | POA: Diagnosis not present

## 2022-12-05 DIAGNOSIS — I700 Atherosclerosis of aorta: Secondary | ICD-10-CM | POA: Diagnosis not present

## 2022-12-05 DIAGNOSIS — E039 Hypothyroidism, unspecified: Secondary | ICD-10-CM | POA: Diagnosis not present

## 2022-12-05 DIAGNOSIS — E538 Deficiency of other specified B group vitamins: Secondary | ICD-10-CM | POA: Diagnosis not present

## 2022-12-05 DIAGNOSIS — E785 Hyperlipidemia, unspecified: Secondary | ICD-10-CM | POA: Diagnosis not present

## 2022-12-05 DIAGNOSIS — D509 Iron deficiency anemia, unspecified: Secondary | ICD-10-CM | POA: Diagnosis not present

## 2022-12-05 DIAGNOSIS — I10 Essential (primary) hypertension: Secondary | ICD-10-CM | POA: Diagnosis not present

## 2022-12-05 NOTE — Telephone Encounter (Signed)
Copied from [place] [id number]. Topic: Appointment Scheduling - Scheduling Inquiry for Clinic >> Dec 05, 2022  2:34 PM Roger Cook wrote: Reason for CRM: Pt was supposed to have a Hospital Follow Up on 12/15/22 but the appointment was canceled due to provider. The next available appointment is December 26, 2022 and pt's wife Roger Cook says he can't wait that long because pt needs to start his iron treatments. Roger Cook wants to know can he see another provider or be squeezed in. Please follow up with Roger Cook.

## 2022-12-05 NOTE — Telephone Encounter (Signed)
You can always use same day slots for hospital follow up appointments.

## 2022-12-06 ENCOUNTER — Emergency Department: Payer: Medicare Other

## 2022-12-06 ENCOUNTER — Other Ambulatory Visit: Payer: Self-pay

## 2022-12-06 ENCOUNTER — Inpatient Hospital Stay
Admission: EM | Admit: 2022-12-06 | Discharge: 2022-12-09 | DRG: 176 | Disposition: A | Discharge status: Home Health | Payer: Medicare Other | Attending: Internal Medicine | Admitting: Internal Medicine

## 2022-12-06 DIAGNOSIS — I6381 Other cerebral infarction due to occlusion or stenosis of small artery: Secondary | ICD-10-CM | POA: Diagnosis not present

## 2022-12-06 DIAGNOSIS — Z8673 Personal history of transient ischemic attack (TIA), and cerebral infarction without residual deficits: Secondary | ICD-10-CM

## 2022-12-06 DIAGNOSIS — R55 Syncope and collapse: Secondary | ICD-10-CM | POA: Diagnosis present

## 2022-12-06 DIAGNOSIS — I10 Essential (primary) hypertension: Secondary | ICD-10-CM | POA: Diagnosis present

## 2022-12-06 DIAGNOSIS — D649 Anemia, unspecified: Secondary | ICD-10-CM | POA: Diagnosis present

## 2022-12-06 DIAGNOSIS — I700 Atherosclerosis of aorta: Secondary | ICD-10-CM | POA: Diagnosis present

## 2022-12-06 DIAGNOSIS — Z8249 Family history of ischemic heart disease and other diseases of the circulatory system: Secondary | ICD-10-CM

## 2022-12-06 DIAGNOSIS — Z79899 Other long term (current) drug therapy: Secondary | ICD-10-CM

## 2022-12-06 DIAGNOSIS — I2609 Other pulmonary embolism with acute cor pulmonale: Secondary | ICD-10-CM | POA: Diagnosis not present

## 2022-12-06 DIAGNOSIS — E039 Hypothyroidism, unspecified: Secondary | ICD-10-CM | POA: Diagnosis present

## 2022-12-06 DIAGNOSIS — Z881 Allergy status to other antibiotic agents status: Secondary | ICD-10-CM | POA: Diagnosis not present

## 2022-12-06 DIAGNOSIS — Z960 Presence of urogenital implants: Secondary | ICD-10-CM | POA: Diagnosis present

## 2022-12-06 DIAGNOSIS — Z888 Allergy status to other drugs, medicaments and biological substances status: Secondary | ICD-10-CM

## 2022-12-06 DIAGNOSIS — E538 Deficiency of other specified B group vitamins: Secondary | ICD-10-CM | POA: Diagnosis present

## 2022-12-06 DIAGNOSIS — Z8546 Personal history of malignant neoplasm of prostate: Secondary | ICD-10-CM | POA: Diagnosis not present

## 2022-12-06 DIAGNOSIS — Z882 Allergy status to sulfonamides status: Secondary | ICD-10-CM

## 2022-12-06 DIAGNOSIS — K219 Gastro-esophageal reflux disease without esophagitis: Secondary | ICD-10-CM | POA: Diagnosis present

## 2022-12-06 DIAGNOSIS — Z79890 Hormone replacement therapy (postmenopausal): Secondary | ICD-10-CM | POA: Diagnosis not present

## 2022-12-06 DIAGNOSIS — F0390 Unspecified dementia without behavioral disturbance: Secondary | ICD-10-CM | POA: Diagnosis present

## 2022-12-06 DIAGNOSIS — G4733 Obstructive sleep apnea (adult) (pediatric): Secondary | ICD-10-CM | POA: Diagnosis present

## 2022-12-06 DIAGNOSIS — Z9049 Acquired absence of other specified parts of digestive tract: Secondary | ICD-10-CM

## 2022-12-06 DIAGNOSIS — R42 Dizziness and giddiness: Secondary | ICD-10-CM | POA: Diagnosis not present

## 2022-12-06 DIAGNOSIS — Z87442 Personal history of urinary calculi: Secondary | ICD-10-CM

## 2022-12-06 DIAGNOSIS — R0689 Other abnormalities of breathing: Secondary | ICD-10-CM | POA: Diagnosis not present

## 2022-12-06 DIAGNOSIS — Z7982 Long term (current) use of aspirin: Secondary | ICD-10-CM | POA: Diagnosis not present

## 2022-12-06 DIAGNOSIS — I129 Hypertensive chronic kidney disease with stage 1 through stage 4 chronic kidney disease, or unspecified chronic kidney disease: Secondary | ICD-10-CM | POA: Diagnosis present

## 2022-12-06 DIAGNOSIS — E785 Hyperlipidemia, unspecified: Secondary | ICD-10-CM | POA: Diagnosis present

## 2022-12-06 DIAGNOSIS — R29810 Facial weakness: Secondary | ICD-10-CM | POA: Diagnosis not present

## 2022-12-06 DIAGNOSIS — E8721 Acute metabolic acidosis: Secondary | ICD-10-CM | POA: Diagnosis present

## 2022-12-06 DIAGNOSIS — I2510 Atherosclerotic heart disease of native coronary artery without angina pectoris: Secondary | ICD-10-CM | POA: Diagnosis present

## 2022-12-06 DIAGNOSIS — I2699 Other pulmonary embolism without acute cor pulmonale: Principal | ICD-10-CM | POA: Diagnosis present

## 2022-12-06 DIAGNOSIS — E611 Iron deficiency: Secondary | ICD-10-CM | POA: Diagnosis present

## 2022-12-06 DIAGNOSIS — Z87891 Personal history of nicotine dependence: Secondary | ICD-10-CM | POA: Diagnosis not present

## 2022-12-06 DIAGNOSIS — Z9842 Cataract extraction status, left eye: Secondary | ICD-10-CM

## 2022-12-06 DIAGNOSIS — Z0389 Encounter for observation for other suspected diseases and conditions ruled out: Secondary | ICD-10-CM | POA: Diagnosis not present

## 2022-12-06 DIAGNOSIS — Z9841 Cataract extraction status, right eye: Secondary | ICD-10-CM

## 2022-12-06 DIAGNOSIS — G4730 Sleep apnea, unspecified: Secondary | ICD-10-CM | POA: Diagnosis present

## 2022-12-06 DIAGNOSIS — I959 Hypotension, unspecified: Secondary | ICD-10-CM | POA: Diagnosis not present

## 2022-12-06 DIAGNOSIS — N1831 Chronic kidney disease, stage 3a: Secondary | ICD-10-CM | POA: Diagnosis present

## 2022-12-06 DIAGNOSIS — R531 Weakness: Secondary | ICD-10-CM | POA: Diagnosis not present

## 2022-12-06 LAB — COMPREHENSIVE METABOLIC PANEL
ALT: 19 U/L (ref 0–44)
AST: 29 U/L (ref 15–41)
Albumin: 3.4 g/dL — ABNORMAL LOW (ref 3.5–5.0)
Alkaline Phosphatase: 77 U/L (ref 38–126)
Anion gap: 6 (ref 5–15)
BUN: 31 mg/dL — ABNORMAL HIGH (ref 8–23)
CO2: 18 mmol/L — ABNORMAL LOW (ref 22–32)
Calcium: 8.4 mg/dL — ABNORMAL LOW (ref 8.9–10.3)
Chloride: 112 mmol/L — ABNORMAL HIGH (ref 98–111)
Creatinine, Ser: 1.53 mg/dL — ABNORMAL HIGH (ref 0.61–1.24)
GFR, Estimated: 42 mL/min — ABNORMAL LOW (ref >60)
Glucose, Bld: 199 mg/dL — ABNORMAL HIGH (ref 70–99)
Potassium: 4.1 mmol/L (ref 3.5–5.1)
Sodium: 136 mmol/L (ref 135–145)
Total Bilirubin: 0.5 mg/dL (ref 0.3–1.2)
Total Protein: 7.1 g/dL (ref 6.5–8.1)

## 2022-12-06 LAB — CBC WITH DIFFERENTIAL/PLATELET
Abs Immature Granulocytes: 0.03 10*3/uL (ref 0.00–0.07)
Basophils Absolute: 0.1 10*3/uL (ref 0.0–0.1)
Basophils Relative: 1 %
Eosinophils Absolute: 0.2 10*3/uL (ref 0.0–0.5)
Eosinophils Relative: 2 %
HCT: 30.1 % — ABNORMAL LOW (ref 39.0–52.0)
Hemoglobin: 9 g/dL — ABNORMAL LOW (ref 13.0–17.0)
Immature Granulocytes: 0 %
Lymphocytes Relative: 27 %
Lymphs Abs: 2.1 10*3/uL (ref 0.7–4.0)
MCH: 24.4 pg — ABNORMAL LOW (ref 26.0–34.0)
MCHC: 29.9 g/dL — ABNORMAL LOW (ref 30.0–36.0)
MCV: 81.6 fL (ref 80.0–100.0)
Monocytes Absolute: 0.9 10*3/uL (ref 0.1–1.0)
Monocytes Relative: 12 %
Neutro Abs: 4.6 10*3/uL (ref 1.7–7.7)
Neutrophils Relative %: 58 %
Platelets: 341 10*3/uL (ref 150–400)
RBC: 3.69 MIL/uL — ABNORMAL LOW (ref 4.22–5.81)
RDW: 17.2 % — ABNORMAL HIGH (ref 11.5–15.5)
WBC: 7.8 10*3/uL (ref 4.0–10.5)
nRBC: 0 % (ref 0.0–0.2)

## 2022-12-06 LAB — TROPONIN I (HIGH SENSITIVITY)
Troponin I (High Sensitivity): 8 ng/L (ref <18)
Troponin I (High Sensitivity): 9 ng/L (ref <18)

## 2022-12-06 LAB — LACTIC ACID, PLASMA: Lactic Acid, Venous: 2.4 mmol/L (ref 0.5–1.9)

## 2022-12-06 LAB — URINALYSIS, ROUTINE W REFLEX MICROSCOPIC
Bacteria, UA: NONE SEEN
Bilirubin Urine: NEGATIVE
Glucose, UA: NEGATIVE mg/dL
Hgb urine dipstick: NEGATIVE
Ketones, ur: NEGATIVE mg/dL
Leukocytes,Ua: NEGATIVE
Nitrite: NEGATIVE
Protein, ur: 30 mg/dL — AB
Specific Gravity, Urine: 1.017 (ref 1.005–1.030)
Squamous Epithelial / HPF: NONE SEEN /HPF (ref 0–5)
pH: 6 (ref 5.0–8.0)

## 2022-12-06 LAB — PROTIME-INR
INR: 1.2 (ref 0.8–1.2)
Prothrombin Time: 15.1 seconds (ref 11.4–15.2)

## 2022-12-06 LAB — APTT: aPTT: 28 seconds (ref 24–36)

## 2022-12-06 LAB — TYPE AND SCREEN
ABO/RH(D): AB POS
Antibody Screen: NEGATIVE

## 2022-12-06 MED ORDER — MEMANTINE HCL 10 MG PO TABS
10.0000 mg | ORAL_TABLET | Freq: Two times a day (BID) | ORAL | Status: DC
  Administered 2022-12-07 – 2022-12-09 (×5): 10 mg via ORAL
  Filled 2022-12-06 (×5): qty 1

## 2022-12-06 MED ORDER — PRAVASTATIN SODIUM 40 MG PO TABS
40.0000 mg | ORAL_TABLET | Freq: Every day | ORAL | Status: DC
  Administered 2022-12-07 – 2022-12-09 (×3): 40 mg via ORAL
  Filled 2022-12-06 (×3): qty 1

## 2022-12-06 MED ORDER — ACETAMINOPHEN 325 MG PO TABS: 650.0000 mg | ORAL_TABLET | Freq: Four times a day (QID) | ORAL | Status: DC | PRN

## 2022-12-06 MED ORDER — ONDANSETRON HCL 4 MG PO TABS: 4.0000 mg | ORAL_TABLET | Freq: Four times a day (QID) | ORAL | Status: DC | PRN

## 2022-12-06 MED ORDER — LEVOTHYROXINE SODIUM 88 MCG PO TABS
88.0000 ug | ORAL_TABLET | Freq: Every day | ORAL | Status: DC
  Administered 2022-12-08 – 2022-12-09 (×2): 88 ug via ORAL
  Filled 2022-12-06 (×4): qty 1

## 2022-12-06 MED ORDER — METOPROLOL SUCCINATE ER 25 MG PO TB24
12.5000 mg | ORAL_TABLET | Freq: Every day | ORAL | Status: DC
  Administered 2022-12-08 – 2022-12-09 (×2): 12.5 mg via ORAL
  Filled 2022-12-06 (×3): qty 1

## 2022-12-06 MED ORDER — HEPARIN BOLUS VIA INFUSION
4000.0000 [IU] | Freq: Once | INTRAVENOUS | Status: AC
  Administered 2022-12-06: 4000 [IU] via INTRAVENOUS
  Filled 2022-12-06: qty 4000

## 2022-12-06 MED ORDER — BRIMONIDINE TARTRATE 0.2 % OP SOLN
1.0000 [drp] | Freq: Two times a day (BID) | OPHTHALMIC | Status: DC
  Administered 2022-12-07 – 2022-12-09 (×5): 1 [drp] via OPHTHALMIC
  Filled 2022-12-06: qty 5

## 2022-12-06 MED ORDER — ONDANSETRON HCL 4 MG/2ML IJ SOLN: 4.0000 mg | Freq: Four times a day (QID) | INTRAMUSCULAR | Status: DC | PRN

## 2022-12-06 MED ORDER — HEPARIN (PORCINE) 25000 UT/250ML-% IV SOLN
1000.0000 [IU]/h | INTRAVENOUS | Status: DC
  Administered 2022-12-06 – 2022-12-07 (×2): 1100 [IU]/h via INTRAVENOUS
  Administered 2022-12-08: 1000 [IU]/h via INTRAVENOUS
  Filled 2022-12-06 (×3): qty 250

## 2022-12-06 MED ORDER — SODIUM CHLORIDE 0.9 % IV BOLUS
1000.0000 mL | Freq: Once | INTRAVENOUS | Status: AC
  Administered 2022-12-06: 1000 mL via INTRAVENOUS

## 2022-12-06 MED ORDER — IOHEXOL 350 MG/ML SOLN
75.0000 mL | Freq: Once | INTRAVENOUS | Status: AC | PRN
  Administered 2022-12-06: 75 mL via INTRAVENOUS

## 2022-12-06 MED ORDER — DONEPEZIL HCL 5 MG PO TABS
10.0000 mg | ORAL_TABLET | Freq: Every day | ORAL | Status: DC
  Administered 2022-12-07 – 2022-12-08 (×2): 10 mg via ORAL
  Filled 2022-12-06 (×2): qty 2

## 2022-12-06 MED ORDER — ACETAMINOPHEN 650 MG RE SUPP: 650.0000 mg | Freq: Four times a day (QID) | RECTAL | Status: DC | PRN

## 2022-12-06 NOTE — ED Notes (Signed)
CT came for patient. Informed RN needing an IV higher up.

## 2022-12-06 NOTE — ED Provider Notes (Signed)
[facility] Provider Note    Event Date/Time   First MD Initiated Contact with Patient 12/06/22 1740     (approximate)   History   Near Syncope   HPI  Roger Cook is a 89 y.o. male history of GI bleeding and anemia requiring transfusion presents to the ER for evaluation of near syncopal episode that occurred while he was walking on the driveway today.  Does appear short of breath but he denies any feeling of shortness of breath.  No headache.  No numbness or tingling denies any chest pain.  No melena no hematochezia denies any abdominal pain no dysuria no fevers or chills.     Physical Exam   Triage Vital Signs: ED Triage Vitals  Enc Vitals Group     BP 12/06/22 1741 (!) 142/48     Pulse Rate 12/06/22 1734 71     Resp 12/06/22 1734 20     Temp 12/06/22 1741 (!) 97.5 F (36.4 C)     Temp Source 12/06/22 1741 Oral     SpO2 12/06/22 1734 96 %     Weight 12/06/22 1733 150 lb (68 kg)     Height 12/06/22 1733 5\' 7"  (1.702 m)     Head Circumference --      Peak Flow --      Pain Score 12/06/22 1741 0     Pain Loc --      Pain Edu? --      Excl. in [place]? --     Most recent vital signs: Vitals:   12/06/22 1830 12/06/22 1900  BP: (!) 142/44 (!) 161/47  Pulse: 78 66  Resp: (!) 36 (!) 24  Temp:    SpO2: 98% 100%     Constitutional: Alert  Eyes: Conjunctivae are normal.  Head: Atraumatic. Nose: No congestion/rhinnorhea. Mouth/Throat: Mucous membranes are moist.   Neck: Painless ROM.  Cardiovascular:   Good peripheral circulation. Respiratory: Normal respiratory effort.  No retractions.  Gastrointestinal: Soft and nontender.  Musculoskeletal:  no deformity Neurologic:  MAE spontaneously. No gross focal neurologic deficits are appreciated.  Skin:  Skin is warm, dry and intact. No rash noted. Psychiatric: Mood and affect are normal. Speech and behavior are normal.    ED Results / Procedures / Treatments   Labs (all labs ordered are  listed, but only abnormal results are displayed) Labs Reviewed  CBC WITH DIFFERENTIAL/PLATELET - Abnormal; Notable for the following components:      Result Value   RBC 3.69 (*)    Hemoglobin 9.0 (*)    HCT 30.1 (*)    MCH 24.4 (*)    MCHC 29.9 (*)    RDW 17.2 (*)    All other components within normal limits  COMPREHENSIVE METABOLIC PANEL - Abnormal; Notable for the following components:   Chloride 112 (*)    CO2 18 (*)    Glucose, Bld 199 (*)    BUN 31 (*)    Creatinine, Ser 1.53 (*)    Calcium 8.4 (*)    Albumin 3.4 (*)    GFR, Estimated 42 (*)    All other components within normal limits  URINALYSIS, ROUTINE W REFLEX MICROSCOPIC - Abnormal; Notable for the following components:   Color, Urine YELLOW (*)    APPearance CLEAR (*)    Protein, ur 30 (*)    All other components within normal limits  LACTIC ACID, PLASMA - Abnormal; Notable for the following components:   Lactic Acid, Venous  2.4 (*)    All other components within normal limits  APTT  PROTIME-INR  TYPE AND SCREEN  TROPONIN I (HIGH SENSITIVITY)  TROPONIN I (HIGH SENSITIVITY)     EKG  ED ECG REPORT I, Merlyn Lot, the attending physician, personally viewed and interpreted this ECG.   Date: 12/06/2022  EKG Time: 17:39  Rate: 65  Rhythm: sinus  Axis: normal  Intervals: normal  ST&T Change: no stemi    RADIOLOGY Please see ED Course for my review and interpretation.  I personally reviewed all radiographic images ordered to evaluate for the above acute complaints and reviewed radiology reports and findings.  These findings were personally discussed with the patient.  Please see medical record for radiology report.    PROCEDURES:  Critical Care performed: Yes, see critical care procedure note(s)  .Critical Care  Performed by: Merlyn Lot, MD Authorized by: Merlyn Lot, MD   Critical care provider statement:    Critical care time (minutes):  35   Critical care was time spent  personally by me on the following activities:  Ordering and performing treatments and interventions, ordering and review of laboratory studies, ordering and review of radiographic studies, pulse oximetry, re-evaluation of patient's condition, review of old charts, obtaining history from patient or surrogate, examination of patient, evaluation of patient's response to treatment, discussions with primary provider, discussions with consultants and development of treatment plan with patient or surrogate    MEDICATIONS ORDERED IN ED: Medications  heparin bolus via infusion 4,000 Units (has no administration in time range)  heparin ADULT infusion 100 units/mL (25000 units/275mL) (has no administration in time range)  sodium chloride 0.9 % bolus 1,000 mL (1,000 mLs Intravenous New Bag/Given 12/06/22 1854)  iohexol (OMNIPAQUE) 350 MG/ML injection 75 mL (75 mLs Intravenous Contrast Given 12/06/22 2142)     IMPRESSION / MDM / [place] / ED COURSE  I reviewed the triage vital signs and the nursing notes.                              Differential diagnosis includes, but is not limited to, dehydration, anemia, electrolyte abnormality, dysrhythmia, sepsis  Patient presenting to the ER for evaluation of symptoms as described above.  Based on symptoms, risk factors and considered above differential, this presenting complaint could reflect a potentially life-threatening illness therefore the patient will be placed on continuous pulse oximetry and telemetry for monitoring.  Laboratory evaluation will be sent to evaluate for the above complaints.     Clinical Course as of 12/06/22 2253  Sat Dec 06, 2022  1835 Chest x-ray on my review and interpretation without evidence of infiltrate or consolidation no pneumothorax. [PR]  1928 Lactate is elevated receiving IV fluids.  He is not hypoxic but he looks tachypneic and I do not appreciate significant wheeze on exam.  Will order CTA to rule out PE. [PR]   2153 CTA my review and interpretation does not show evidence of pneumonia.  Does show evidence of bilateral PE.  Will await formal radiology report.  Awaiting repeat lactate after IV fluids.  Troponin negative. [PR]  2235 Have discussed findings of bilateral PE with patient and family at bedside.  I have recommended heparin discussed that he will be high risk given history of GI bleeding currently and without any signs of bleeding.  Will consult hospitalist for admission. [PR]    Clinical Course User Index [PR] Merlyn Lot, MD  FINAL CLINICAL IMPRESSION(S) / ED DIAGNOSES   Final diagnoses:  Other acute pulmonary embolism, unspecified whether acute cor pulmonale present ([place])  Near syncope     Rx / DC Orders   ED Discharge Orders     None        Note:  This document was prepared using Dragon voice recognition software and may include unintentional dictation errors.    Merlyn Lot, MD 12/06/22 2253

## 2022-12-06 NOTE — Assessment & Plan Note (Signed)
Continue levothyroxine 

## 2022-12-06 NOTE — Assessment & Plan Note (Signed)
Continue CPAP.  

## 2022-12-06 NOTE — ED Notes (Signed)
Lactate of 2.4 reported to Dr Quentin Cornwall

## 2022-12-06 NOTE — ED Notes (Signed)
Second RN attempted IV, Unsuccessful. Will have an [place] placed IV.

## 2022-12-06 NOTE — H&P (Signed)
History and Physical    Patient: Roger Cook [id number] DOB: 1927/08/21 DOA: 12/06/2022 DOS: the patient was seen and examined on 12/06/2022 PCP: [name], MD  Patient coming from: Home  Chief Complaint:  Chief Complaint  Patient presents with   Near Syncope    HPI: Roger Cook is a 89 y.o. male with medical history significant for dementia, hypertension, hyperlipidemia, hypothyroid, history of TIA, history of prostate cancer, hospitalized from 2/23 to 2/27 with symptomatic anemia, hemoglobin 6.0, after presenting with a fall who presents to the ED following a near syncopal event while walking down his driveway.  His recent stay he had a negative EGD but his anemia was of presumed GI etiology given 1 known history of AVMs on colonoscopy from colonoscopy in April 2023.  He received a unit of PRBCs to hemoglobin of 8.7.  Patient was in his usual state of health.  He endorses mild shortness of breath.  He denies palpitations, chest pain, one-sided weakness numbness or tingling.  He denies nausea or vomiting.  Denies black or bloody stool.  Denies cough, fever or chills. ED course and data review: Tachypneic to the mid 20s in the ED with otherwise normal vitals and not hypoxic.  Labs notable for hemoglobin of 9.0, about the same as on discharge on 11/18/2022.  WBC 7800, lactic acid 2.4.  Creatinine at baseline at 1.53, bicarb 18, blood glucose 199.  Troponin 9.  Urinalysis unremarkable. EKG, independently interpreted sinus rhythm at 66 with a left bundle branch block. CT head nonacute  CTA chest shows bilateral PE as follows: lMPRESSION: 1. Marked severity bilateral pulmonary embolism with CT evidence of right heart strain (RV/LV ratio of 1.03). 2. Mild bilateral lower lobe atelectasis. 3. Chronic compression fracture deformity of the T4 vertebral body. 4. Aortic atherosclerosis.  Patient started on a heparin infusion.  Hospitalist consulted for admission.     Past Medical  History:  Diagnosis Date   Anemia    Aortic atherosclerosis (HCC)    Bell palsy    Bilateral renal cysts    Bladder tumor    CAD (coronary artery disease)    DOE (dyspnea on exertion)    Elevated lactic acid level 11/13/2022   GERD (gastroesophageal reflux disease)    History of angina    History of penile implant    History of prostate cancer    Hyperlipidemia    Hypertension    Hypothyroidism    Lipoma    MRSA bacteremia 02/2011   Nephrolithiasis    OSA on CPAP    Pneumonia    as a teenager   Past Surgical History:  Procedure Laterality Date   APPENDECTOMY  2005   CARDIAC CATHETERIZATION N/A 08/13/1990   75% pLCx, 75% mLCx, 25% mLAD, 75% D2, 50% right renal artery; Location: [facility]; Surgeon: Doristine Bosworth, MD   CATARACT EXTRACTION  2005   also had a macular hole in 2005   CATARACT EXTRACTION  2008   COLONOSCOPY     2003, 2014   COLONOSCOPY WITH PROPOFOL N/A 12/26/2021   Procedure: COLONOSCOPY WITH PROPOFOL;  Surgeon: Lucilla Lame, MD;  Location: [facility] ENDOSCOPY;  Service: Endoscopy;  Laterality: N/A;   ESOPHAGOGASTRODUODENOSCOPY     2004, 2014   ESOPHAGOGASTRODUODENOSCOPY (EGD) WITH PROPOFOL N/A 12/26/2021   Procedure: ESOPHAGOGASTRODUODENOSCOPY (EGD) WITH PROPOFOL;  Surgeon: Lucilla Lame, MD;  Location: [facility] ENDOSCOPY;  Service: Endoscopy;  Laterality: N/A;   ESOPHAGOGASTRODUODENOSCOPY (EGD) WITH PROPOFOL N/A 11/15/2022   Procedure: ESOPHAGOGASTRODUODENOSCOPY (EGD) WITH PROPOFOL;  Surgeon: Lesly Rubenstein, MD;  Location: [facility] ENDOSCOPY;  Service: Endoscopy;  Laterality: N/A;   HAND SURGERY Left 06/26/2011   Malignancy removed from left hand   [place]   penile inplant  1984   polyp of rectum  2003   [place]   Abdominal, had to have radiation treatment with the procedure   PROSTATE SURGERY     TONSILLECTOMY     TRANSURETHRAL RESECTION OF BLADDER TUMOR N/A 03/12/2021   Procedure: TRANSURETHRAL RESECTION OF  BLADDER TUMOR (TURBT);  Surgeon: Abbie Sons, MD;  Location: [facility] ORS;  Service: Urology;  Laterality: N/A;   Social History:  reports that he quit smoking about 60 years ago. His smoking use included cigarettes. He has a 16.00 pack-year smoking history. He has never used smokeless tobacco. He reports that he does not currently use alcohol after a past usage of about 2.0 standard drinks of alcohol per week. He reports that he does not use drugs.  Allergies  Allergen Reactions   Levofloxacin Swelling    lips swelling   Povidone Iodine Other (See Comments)    Unknown, can not remember   Sulfa Antibiotics     Unknown, can not remember   Benadryl [Diphenhydramine] Rash    Family History  Problem Relation Age of Onset   Lung cancer Mother    Heart disease Father     Prior to Admission medications   Medication Sig Start Date End Date Taking? Authorizing Provider  Apoaequorin (PREVAGEN) 10 MG CAPS Take by mouth.    [provider]  ARTIFICIAL TEAR SOLUTION OP Place 1 drop into both eyes 2 (two) times daily as needed (dry eyes).    [provider]  aspirin 81 MG tablet Take 81 mg by mouth daily.  01/05/09   [provider]  brimonidine (ALPHAGAN) 0.2 % ophthalmic solution Place 1 drop into the left eye 2 (two) times daily. 01/25/21   [provider]  Calcium Carbonate-Vit D-Min (CALCIUM 1200 PO) Take by mouth.    [provider]  cyanocobalamin (VITAMIN B12) 1000 MCG tablet Take 1 tablet (1,000 mcg total) by mouth daily. 11/18/22   Nicole Kindred A, DO  donepezil (ARICEPT) 10 MG tablet TAKE 1 TABLET BY MOUTH AT BEDTIME 06/20/21   [name], MD  fluticasone [facility]) 50 MCG/ACT nasal spray Place 2 sprays into both nostrils daily as needed for allergies. 09/25/21   [name], MD  isosorbide mononitrate (IMDUR) 60 MG 24 hr tablet TAKE ONE TABLET EVERY DAY 10/20/22   [name], MD  ketoconazole (NIZORAL) 2 % cream APPLY TO GLANS OF  PENIS DAILY 10/08/22   [name], MD  levothyroxine (SYNTHROID) 88 MCG tablet TAKE 1 TABLET EVERY DAY ON EMPTY STOMACHWITH A GLASS OF WATER AT LEAST 30-60 MINBEFORE BREAKFAST 10/29/22   [name], MD  memantine (NAMENDA) 10 MG tablet TAKE 1 TABLET BY MOUTH TWO TIMES DAILY 11/19/21   [name], MD  metoprolol succinate (TOPROL-XL) 25 MG 24 hr tablet Take 12.5 mg by mouth daily. 01/25/21   [provider]  MULTIPLE VITAMIN PO Take 1 tablet by mouth daily.    [provider]  NIFEdipine (PROCARDIA-XL/NIFEDICAL-XL) 30 MG 24 hr tablet Take 30 mg by mouth daily. 07/11/21   [provider]  NON FORMULARY CPAP (Free Text) - Historical Medication  As directed  Started 22-Sep-1994 Active 09/22/1994  [provider]  pravastatin (PRAVACHOL) 40 MG tablet TAKE 1 TABLET BY MOUTH DAILY 11/24/21   [name], MD  PREVIDENT 5000 DRY MOUTH 1.1 % GEL dental gel Place 1 application onto teeth 2 (two) times daily. 02/19/21   [provider]    Physical Exam: Vitals:   12/06/22 1741 12/06/22 1800 12/06/22 1830 12/06/22 1900  BP: (!) 142/48 (!) 131/47 (!) 142/44 (!) 161/47  Pulse: 65 67 78 66  Resp: (!) 26 (!) 26 (!) 36 (!) 24  Temp: (!) 97.5 F (36.4 C)     TempSrc: Oral     SpO2: 96% 100% 98% 100%  Weight:      Height:       Physical Exam Vitals and nursing note reviewed.  Constitutional:      General: He is not in acute distress. HENT:     Head: Normocephalic and atraumatic.  Cardiovascular:     Rate and Rhythm: Normal rate and regular rhythm.     Heart sounds: Normal heart sounds.  Pulmonary:     Effort: Pulmonary effort is normal.     Breath sounds: Normal breath sounds.  Abdominal:     Palpations: Abdomen is soft.     Tenderness: There is no abdominal tenderness.  Neurological:     Mental Status: Mental status is at baseline.     Labs on Admission: I have personally reviewed following labs and imaging studies  CBC: Recent Labs   Lab 12/06/22 1752  WBC 7.8  NEUTROABS 4.6  HGB 9.0*  HCT 30.1*  MCV 81.6  PLT [id number]   Basic Metabolic Panel: Recent Labs  Lab 12/06/22 1752  NA 136  K 4.1  CL 112*  CO2 18*  GLUCOSE 199*  BUN 31*  CREATININE 1.53*  CALCIUM 8.4*   GFR: Estimated Creatinine Clearance: 28.2 mL/min (A) (by C-G formula based on SCr of 1.53 mg/dL (H)). Liver Function Tests: Recent Labs  Lab 12/06/22 1752  AST 29  ALT 19  ALKPHOS 77  BILITOT 0.5  PROT 7.1  ALBUMIN 3.4*   No results for input(s): "LIPASE", "AMYLASE" in the last 168 hours. No results for input(s): "AMMONIA" in the last 168 hours. Coagulation Profile: No results for input(s): "INR", "PROTIME" in the last 168 hours. Cardiac Enzymes: No results for input(s): "CKTOTAL", "CKMB", "CKMBINDEX", "TROPONINI" in the last 168 hours. BNP (last 3 results) No results for input(s): "PROBNP" in the last 8760 hours. HbA1C: No results for input(s): "HGBA1C" in the last 72 hours. CBG: No results for input(s): "GLUCAP" in the last 168 hours. Lipid Profile: No results for input(s): "CHOL", "HDL", "LDLCALC", "TRIG", "CHOLHDL", "LDLDIRECT" in the last 72 hours. Thyroid Function Tests: No results for input(s): "TSH", "T4TOTAL", "FREET4", "T3FREE", "THYROIDAB" in the last 72 hours. Anemia Panel: No results for input(s): "VITAMINB12", "FOLATE", "FERRITIN", "TIBC", "IRON", "RETICCTPCT" in the last 72 hours. Urine analysis:    Component Value Date/Time   COLORURINE YELLOW (A) 12/06/2022 1738   APPEARANCEUR CLEAR (A) 12/06/2022 1738   APPEARANCEUR Cloudy (A) 02/28/2021 1454   LABSPEC 1.017 12/06/2022 1738   LABSPEC 1.018 10/21/2012 1747   PHURINE 6.0 12/06/2022 1738   GLUCOSEU NEGATIVE 12/06/2022 1738   GLUCOSEU Negative 10/21/2012 1747   HGBUR NEGATIVE 12/06/2022 1738   BILIRUBINUR NEGATIVE 12/06/2022 1738   BILIRUBINUR Negative 02/28/2021 1454   BILIRUBINUR Negative 10/21/2012 1747   KETONESUR NEGATIVE 12/06/2022 1738   PROTEINUR 30  (A) 12/06/2022 1738   NITRITE NEGATIVE 12/06/2022 1738   LEUKOCYTESUR NEGATIVE  12/06/2022 1738   LEUKOCYTESUR Negative 10/21/2012 1747    Radiological Exams on Admission: CT Angio Chest PE W and/or Wo Contrast  Result Date: 12/06/2022 CLINICAL DATA:  Near syncope. EXAM: CT ANGIOGRAPHY CHEST WITH CONTRAST TECHNIQUE: Multidetector CT imaging of the chest was performed using the standard protocol during bolus administration of intravenous contrast. Multiplanar CT image reconstructions and MIPs were obtained to evaluate the vascular anatomy. RADIATION DOSE REDUCTION: This exam was performed according to the departmental dose-optimization program which includes automated exposure control, adjustment of the mA and/or kV according to patient size and/or use of iterative reconstruction technique. CONTRAST:  63mL OMNIPAQUE IOHEXOL 350 MG/ML SOLN COMPARISON:  None Available. FINDINGS: Cardiovascular: There is marked severity calcification of the aortic arch and descending thoracic aorta, without evidence of aortic aneurysm. Satisfactory opacification of the pulmonary arteries to the segmental level. Marked severity areas of intraluminal low attenuation are seen involving multiple bilateral upper lobe, right middle lobe and bilateral lower lobe branches of the bilateral pulmonary arteries. No saddle embolus is identified. Normal heart size with marked severity coronary artery calcification and right heart strain (RV/LV ratio of 1.03). No pericardial effusion. Mediastinum/Nodes: No enlarged mediastinal, hilar, or axillary lymph nodes. The thyroid gland is not clearly identified. The trachea and esophagus demonstrate no significant findings. Lungs/Pleura: Mild atelectatic changes are seen within the posterior aspects of the bilateral lower lobes. Focal linear scarring and/or atelectasis is noted within the posterolateral aspect of the right lung base. There is no evidence of an acute infiltrate, pleural effusion or  pneumothorax. Upper Abdomen: A 2.2 cm diameter simple cyst is seen along the anterior aspect of the upper pole of the right kidney. Musculoskeletal: A chronic compression fracture deformity is seen involving the T4 vertebral body. Review of the MIP images confirms the above findings. IMPRESSION: 1. Marked severity bilateral pulmonary embolism with CT evidence of right heart strain (RV/LV ratio of 1.03). 2. Mild bilateral lower lobe atelectasis. 3. Chronic compression fracture deformity of the T4 vertebral body. 4. Aortic atherosclerosis. Aortic Atherosclerosis (ICD10-I70.0). Electronically Signed   By: Virgina Norfolk M.D.   On: 12/06/2022 22:16   CT HEAD WO CONTRAST (5MM)  Result Date: 12/06/2022 CLINICAL DATA:  Syncope/presyncope, cerebrovascular cause suspected. Dizziness EXAM: CT HEAD WITHOUT CONTRAST TECHNIQUE: Contiguous axial images were obtained from the base of the skull through the vertex without intravenous contrast. RADIATION DOSE REDUCTION: This exam was performed according to the departmental dose-optimization program which includes automated exposure control, adjustment of the mA and/or kV according to patient size and/or use of iterative reconstruction technique. COMPARISON:  11/13/2022 FINDINGS: Brain: Normal anatomic configuration. Moderate diffuse parenchymal volume loss is commensurate with the patient's age. Stable moderate periventricular white matter changes are present likely reflecting the sequela of small vessel ischemia. Stable remote lacunar infarct within the left basal ganglia. No abnormal intra or extra-axial mass lesion or fluid collection. No abnormal mass effect or midline shift. No evidence of acute intracranial hemorrhage or infarct. Ventricular size is commensurate with the degree of parenchymal volume loss. Cerebellum unremarkable. Vascular: No asymmetric hyperdense vasculature at the skull base. Skull: Intact Sinuses/Orbits: There is complete opacification of the a  frontal sinuses bilaterally, unchanged from prior examination, in keeping with changes of chronic sinusitis. Surgical changes of bilateral ethmoidectomy, maxillary antrectomy, and middle turbinectomy are identified. Moderate mucosal thickening within the left maxillary sinus without air-fluid level. The orbits are unremarkable. Other: Mastoid air cells and middle ear cavities are clear. IMPRESSION: 1. No acute intracranial abnormality. No  calvarial fracture. 2. Stable senescent changes and remote lacunar infarct within the left basal ganglia. 3. Stable paranasal sinus disease. Electronically Signed   By: Fidela Salisbury M.D.   On: 12/06/2022 19:02   DG Chest 2 View  Result Date: 12/06/2022 CLINICAL DATA:  Near syncope . EXAM: CHEST - 2 VIEW COMPARISON:  11/13/2022 FINDINGS: The lungs are clear without focal pneumonia, edema, pneumothorax or pleural effusion. Interstitial markings are diffusely coarsened with chronic features. Patchy airspace disease in the suprahilar right lung and right base is similar to prior. The cardiopericardial silhouette is within normal limits for size. Telemetry leads overlie the chest. IMPRESSION: Chronic interstitial changes with patchy airspace disease in the suprahilar right lung and right base, potentially scarring although infiltrate not excluded. Electronically Signed   By: Misty Stanley M.D.   On: 12/06/2022 18:46     Data Reviewed: Relevant notes from primary care and specialist visits, past discharge summaries as available in EHR, including Care Everywhere. Prior diagnostic testing as pertinent to current admission diagnoses Updated medications and problem lists for reconciliation ED course, including vitals, labs, imaging, treatment and response to treatment Triage notes, nursing and pharmacy notes and ED provider's notes Notable results as noted in HPI   Assessment and Plan: * Bilateral pulmonary embolism ([place]) Possibly provoked.  Patient was recently  hospitalized and has a history of prostate cancer Currently not hypoxic or tachycardic Continue heparin infusion Transition to oral anticoagulation in 1 day if he remains stable   Chronic anemia History of  AVMs on colonoscopy 2023, normal EGD 10/2022 History of right renal hematoma History of severe anemia, hemoglobin 6.0, 10/2022 s/p transfusion Appears stable over the past month 8.7> 9.0 Recently hospitalized February 2024 for hemoglobin of 6.0 with unremarkable EGD -Monitor hemoglobin and other patient is on systemic anticoagulation  Postural dizziness with presyncope Secondary to syncope Neurologic checks and fall precautions  Chronic kidney disease, stage 3a ([place]) Renal function stable  Dementia ([place]) Continue donepezil and memantine Delirium precautions  Hypothyroidism Continue levothyroxine  Severe obstructive sleep apnea Continue CPAP  H/O malignant neoplasm of prostate No acute issues suspected  Essential hypertension Continue home antihypertensives        DVT prophylaxis: Heparin infusion  Consults: none  Advance Care Planning:   Code Status: Prior   Family Communication: none  Disposition Plan: Back to previous home environment  Severity of Illness: The appropriate patient status for this patient is OBSERVATION. Observation status is judged to be reasonable and necessary in order to provide the required intensity of service to ensure the patient's safety. The patient's presenting symptoms, physical exam findings, and initial radiographic and laboratory data in the context of their medical condition is felt to place them at decreased risk for further clinical deterioration. Furthermore, it is anticipated that the patient will be medically stable for discharge from the hospital within 2 midnights of admission.   Author: Athena Masse, MD 12/06/2022 11:06 PM  For on call review [email or web address].

## 2022-12-06 NOTE — Assessment & Plan Note (Signed)
No acute issues suspected 

## 2022-12-06 NOTE — Assessment & Plan Note (Signed)
Continue home antihypertensives 

## 2022-12-06 NOTE — Assessment & Plan Note (Signed)
Continue donepezil and memantine Delirium precautions

## 2022-12-06 NOTE — Assessment & Plan Note (Signed)
Renal function stable.

## 2022-12-06 NOTE — Progress Notes (Signed)
ANTICOAGULATION CONSULT NOTE - Initial Consult  Pharmacy Consult for Heparin  Indication: pulmonary embolus  Allergies  Allergen Reactions   Levofloxacin Swelling    lips swelling   Povidone Iodine     Unknown, can not remember   Sulfa Antibiotics     Unknown, can not remember   Benadryl [Diphenhydramine] Rash    Patient Measurements: Height: 5\' 7"  (170.2 cm) Weight: 68 kg (150 lb) IBW/kg (Calculated) : 66.1 Heparin Dosing Weight: 68 kg   Vital Signs: Temp: 97.5 F (36.4 C) (03/16 1741) Temp Source: Oral (03/16 1741) BP: 161/47 (03/16 1900) Pulse Rate: 66 (03/16 1900)  Labs: Recent Labs    12/06/22 1752  HGB 9.0*  HCT 30.1*  PLT 341  CREATININE 1.53*  TROPONINIHS 8    Estimated Creatinine Clearance: 28.2 mL/min (A) (by C-G formula based on SCr of 1.53 mg/dL (H)).   Medical History: Past Medical History:  Diagnosis Date   Anemia    Aortic atherosclerosis (HCC)    Bell palsy    Bilateral renal cysts    Bladder tumor    CAD (coronary artery disease)    DOE (dyspnea on exertion)    Elevated lactic acid level 11/13/2022   GERD (gastroesophageal reflux disease)    History of angina    History of penile implant    History of prostate cancer    Hyperlipidemia    Hypertension    Hypothyroidism    Lipoma    MRSA bacteremia 02/2011   Nephrolithiasis    OSA on CPAP    Pneumonia    as a teenager    Medications:  (Not in a hospital admission)   Assessment: Pharmacy consulted to dose heparin in this 89 year old male with PE.  No prior anticoag noted.  CrCl = 28.2 ml/min  Goal of Therapy:  Heparin level 0.3-0.7 units/ml Monitor platelets by anticoagulation protocol: Yes   Plan:  Give 4000 units bolus x 1 Start heparin infusion at 1100 units/hr Check anti-Xa level in 8 hours and daily while on heparin Continue to monitor H&H and platelets  Coston Mandato D 12/06/2022,10:36 PM

## 2022-12-06 NOTE — ED Notes (Signed)
Attempted IV unsuccessful. Will have another RN try for IV.

## 2022-12-06 NOTE — ED Triage Notes (Signed)
Patient states he was walking down driveway and felt dizzy; recently discharged from [facility] for anemia requiring blood transfusion.

## 2022-12-06 NOTE — ED Notes (Signed)
Pt stood at bedside to provide urine sample. Pt unsuccessful. Pt back in bed. Denies needs at this time. Wife at bedside.

## 2022-12-06 NOTE — Assessment & Plan Note (Signed)
Secondary to syncope Neurologic checks and fall precautions

## 2022-12-06 NOTE — Assessment & Plan Note (Signed)
Possibly provoked.  Patient was recently hospitalized and has a history of prostate cancer Currently not hypoxic or tachycardic Continue heparin infusion Transition to oral anticoagulation in 1 day if he remains stable

## 2022-12-06 NOTE — Assessment & Plan Note (Addendum)
History of  AVMs on colonoscopy 2023, normal EGD 10/2022 History of right renal hematoma History of severe anemia, hemoglobin 6.0, 10/2022 s/p transfusion Appears stable over the past month 8.7> 9.0 Recently hospitalized February 2024 for hemoglobin of 6.0 with unremarkable EGD -Monitor hemoglobin and other patient is on systemic anticoagulation

## 2022-12-07 DIAGNOSIS — K219 Gastro-esophageal reflux disease without esophagitis: Secondary | ICD-10-CM | POA: Diagnosis present

## 2022-12-07 DIAGNOSIS — E538 Deficiency of other specified B group vitamins: Secondary | ICD-10-CM | POA: Diagnosis present

## 2022-12-07 DIAGNOSIS — Z0389 Encounter for observation for other suspected diseases and conditions ruled out: Secondary | ICD-10-CM | POA: Diagnosis not present

## 2022-12-07 DIAGNOSIS — I2609 Other pulmonary embolism with acute cor pulmonale: Secondary | ICD-10-CM | POA: Diagnosis not present

## 2022-12-07 DIAGNOSIS — R55 Syncope and collapse: Secondary | ICD-10-CM | POA: Diagnosis present

## 2022-12-07 DIAGNOSIS — Z8546 Personal history of malignant neoplasm of prostate: Secondary | ICD-10-CM | POA: Diagnosis not present

## 2022-12-07 DIAGNOSIS — E785 Hyperlipidemia, unspecified: Secondary | ICD-10-CM | POA: Diagnosis present

## 2022-12-07 DIAGNOSIS — E039 Hypothyroidism, unspecified: Secondary | ICD-10-CM | POA: Diagnosis present

## 2022-12-07 DIAGNOSIS — I2699 Other pulmonary embolism without acute cor pulmonale: Secondary | ICD-10-CM | POA: Diagnosis present

## 2022-12-07 DIAGNOSIS — G4733 Obstructive sleep apnea (adult) (pediatric): Secondary | ICD-10-CM | POA: Diagnosis present

## 2022-12-07 DIAGNOSIS — Z8673 Personal history of transient ischemic attack (TIA), and cerebral infarction without residual deficits: Secondary | ICD-10-CM | POA: Diagnosis not present

## 2022-12-07 DIAGNOSIS — D649 Anemia, unspecified: Secondary | ICD-10-CM | POA: Diagnosis present

## 2022-12-07 DIAGNOSIS — E8721 Acute metabolic acidosis: Secondary | ICD-10-CM | POA: Diagnosis present

## 2022-12-07 DIAGNOSIS — I700 Atherosclerosis of aorta: Secondary | ICD-10-CM | POA: Diagnosis present

## 2022-12-07 DIAGNOSIS — Z8249 Family history of ischemic heart disease and other diseases of the circulatory system: Secondary | ICD-10-CM | POA: Diagnosis not present

## 2022-12-07 DIAGNOSIS — Z960 Presence of urogenital implants: Secondary | ICD-10-CM | POA: Diagnosis present

## 2022-12-07 DIAGNOSIS — Z7982 Long term (current) use of aspirin: Secondary | ICD-10-CM | POA: Diagnosis not present

## 2022-12-07 DIAGNOSIS — N1831 Chronic kidney disease, stage 3a: Secondary | ICD-10-CM | POA: Diagnosis present

## 2022-12-07 DIAGNOSIS — Z881 Allergy status to other antibiotic agents status: Secondary | ICD-10-CM | POA: Diagnosis not present

## 2022-12-07 DIAGNOSIS — I2510 Atherosclerotic heart disease of native coronary artery without angina pectoris: Secondary | ICD-10-CM | POA: Diagnosis present

## 2022-12-07 DIAGNOSIS — Z79890 Hormone replacement therapy (postmenopausal): Secondary | ICD-10-CM | POA: Diagnosis not present

## 2022-12-07 DIAGNOSIS — I129 Hypertensive chronic kidney disease with stage 1 through stage 4 chronic kidney disease, or unspecified chronic kidney disease: Secondary | ICD-10-CM | POA: Diagnosis present

## 2022-12-07 DIAGNOSIS — F0390 Unspecified dementia without behavioral disturbance: Secondary | ICD-10-CM | POA: Diagnosis present

## 2022-12-07 DIAGNOSIS — E611 Iron deficiency: Secondary | ICD-10-CM | POA: Diagnosis present

## 2022-12-07 DIAGNOSIS — Z87891 Personal history of nicotine dependence: Secondary | ICD-10-CM | POA: Diagnosis not present

## 2022-12-07 DIAGNOSIS — Z87442 Personal history of urinary calculi: Secondary | ICD-10-CM | POA: Diagnosis not present

## 2022-12-07 LAB — CBC
HCT: 27.4 % — ABNORMAL LOW (ref 39.0–52.0)
Hemoglobin: 8.4 g/dL — ABNORMAL LOW (ref 13.0–17.0)
MCH: 24.6 pg — ABNORMAL LOW (ref 26.0–34.0)
MCHC: 30.7 g/dL (ref 30.0–36.0)
MCV: 80.4 fL (ref 80.0–100.0)
Platelets: 247 10*3/uL (ref 150–400)
RBC: 3.41 MIL/uL — ABNORMAL LOW (ref 4.22–5.81)
RDW: 17.2 % — ABNORMAL HIGH (ref 11.5–15.5)
WBC: 7.9 10*3/uL (ref 4.0–10.5)
nRBC: 0 % (ref 0.0–0.2)

## 2022-12-07 LAB — HEPARIN LEVEL (UNFRACTIONATED)
Heparin Unfractionated: 0.66 IU/mL (ref 0.30–0.70)
Heparin Unfractionated: 0.66 IU/mL (ref 0.30–0.70)

## 2022-12-07 NOTE — Progress Notes (Signed)
ANTICOAGULATION CONSULT NOTE - Initial Consult  Pharmacy Consult for Heparin  Indication: pulmonary embolus  Allergies  Allergen Reactions   Levofloxacin Swelling    lips swelling   Povidone Iodine Other (See Comments)    Unknown, can not remember   Sulfa Antibiotics     Unknown, can not remember   Benadryl [Diphenhydramine] Rash    Patient Measurements: Height: 5\' 7"  (170.2 cm) Weight: 68 kg (150 lb) IBW/kg (Calculated) : 66.1 Heparin Dosing Weight: 68 kg   Vital Signs: Temp: 97.7 F (36.5 C) (03/17 0038) Temp Source: Oral (03/16 2315) BP: 174/64 (03/17 0038) Pulse Rate: 75 (03/17 0038)  Labs: Recent Labs    12/06/22 1752 12/06/22 2216 12/06/22 2301 12/07/22 0424  HGB 9.0*  --   --  8.4*  HCT 30.1*  --   --  27.4*  PLT 341  --   --  247  APTT  --   --  28  --   LABPROT  --   --  15.1  --   INR  --   --  1.2  --   HEPARINUNFRC  --   --   --  0.66  CREATININE 1.53*  --   --   --   TROPONINIHS 8 9  --   --      Estimated Creatinine Clearance: 28.2 mL/min (A) (by C-G formula based on SCr of 1.53 mg/dL (H)).   Medical History: Past Medical History:  Diagnosis Date   Anemia    Aortic atherosclerosis (HCC)    Bell palsy    Bilateral renal cysts    Bladder tumor    CAD (coronary artery disease)    DOE (dyspnea on exertion)    Elevated lactic acid level 11/13/2022   GERD (gastroesophageal reflux disease)    History of angina    History of penile implant    History of prostate cancer    Hyperlipidemia    Hypertension    Hypothyroidism    Lipoma    MRSA bacteremia 02/2011   Nephrolithiasis    OSA on CPAP    Pneumonia    as a teenager    Medications:  Medications Prior to Admission  Medication Sig Dispense Refill Last Dose   Apoaequorin (PREVAGEN) 10 MG CAPS Take 10 mg by mouth daily.   12/05/2022 at [facility] [facility] Place 1 drop into both eyes 2 (two) times daily as needed (dry eyes).   prn at prn   aspirin 81 MG tablet Take 81  mg by mouth daily.    12/05/2022 at 2100   brimonidine (ALPHAGAN) 0.2 % ophthalmic solution Place 1 drop into the left eye 2 (two) times daily.   12/05/2022 at 2100   Calcium Carbonate-Vit D-Min (CALCIUM 1200 PO) Take by mouth.   12/05/2022 at 2100   fluticasone (FLONASE) 50 MCG/ACT nasal spray Place 2 sprays into both nostrils daily as needed for allergies. 16 g 5 prn at prn   cyanocobalamin (VITAMIN B12) 1000 MCG tablet Take 1 tablet (1,000 mcg total) by mouth daily. 30 tablet 2    donepezil (ARICEPT) 10 MG tablet TAKE 1 TABLET BY MOUTH AT BEDTIME 90 tablet 4    isosorbide mononitrate (IMDUR) 60 MG 24 hr tablet TAKE ONE TABLET EVERY DAY 90 tablet 3    ketoconazole (NIZORAL) 2 % cream APPLY TO GLANS OF PENIS DAILY 30 g 0    levothyroxine (SYNTHROID) 88 MCG tablet TAKE 1 TABLET EVERY DAY ON EMPTY  STOMACHWITH A GLASS OF WATER AT LEAST 30-60 MINBEFORE BREAKFAST 90 tablet 0    memantine (NAMENDA) 10 MG tablet TAKE 1 TABLET BY MOUTH TWO TIMES DAILY 180 tablet 4    metoprolol succinate (TOPROL-XL) 25 MG 24 hr tablet Take 12.5 mg by mouth daily.      MULTIPLE VITAMIN PO Take 1 tablet by mouth daily.      NIFEdipine (PROCARDIA-XL/NIFEDICAL-XL) 30 MG 24 hr tablet Take 30 mg by mouth daily.      NON FORMULARY CPAP (Free Text) - Historical Medication  As directed  Started 22-Sep-1994 Active      pravastatin (PRAVACHOL) 40 MG tablet TAKE 1 TABLET BY MOUTH DAILY 90 tablet 4    PREVIDENT 5000 DRY MOUTH 1.1 % GEL dental gel Place 1 application onto teeth 2 (two) times daily.       Assessment: Pharmacy consulted to dose heparin in this 89 year old male with PE.  No prior anticoag noted.  CrCl = 28.2 ml/min  Goal of Therapy:  Heparin level 0.3-0.7 units/ml Monitor platelets by anticoagulation protocol: Yes   Plan:  3/17:  HL @ 0424 = 0.66, therapeutic X 1  Will continue pt on current rate and recheck HL on 3/17 @ 1200.  Daily CBC   Muriel Wilber D 12/07/2022,5:05 AM

## 2022-12-07 NOTE — Progress Notes (Signed)
ANTICOAGULATION CONSULT NOTE  Pharmacy Consult for Heparin Infusion Indication: pulmonary embolus  Allergies  Allergen Reactions   Levofloxacin Swelling    lips swelling   Povidone Iodine Other (See Comments)    Unknown, can not remember   Sulfa Antibiotics     Unknown, can not remember   Benadryl [Diphenhydramine] Rash    Patient Measurements: Height: 5\' 7"  (170.2 cm) Weight: 68 kg (150 lb) IBW/kg (Calculated) : 66.1 Heparin Dosing Weight: 68 kg   Vital Signs: Temp: 98.4 F (36.9 C) (03/17 1304) Temp Source: Oral (03/17 1304) BP: 181/62 (03/17 1304) Pulse Rate: 58 (03/17 1304)  Labs: Recent Labs    12/06/22 1752 12/06/22 2216 12/06/22 2301 12/07/22 0424 12/07/22 1220  HGB 9.0*  --   --  8.4*  --   HCT 30.1*  --   --  27.4*  --   PLT 341  --   --  247  --   APTT  --   --  28  --   --   LABPROT  --   --  15.1  --   --   INR  --   --  1.2  --   --   HEPARINUNFRC  --   --   --  0.66 0.66  CREATININE 1.53*  --   --   --   --   TROPONINIHS 8 9  --   --   --      Estimated Creatinine Clearance: 28.2 mL/min (A) (by C-G formula based on SCr of 1.53 mg/dL (H)).   Medical History: Past Medical History:  Diagnosis Date   Anemia    Aortic atherosclerosis (HCC)    Bell palsy    Bilateral renal cysts    Bladder tumor    CAD (coronary artery disease)    DOE (dyspnea on exertion)    Elevated lactic acid level 11/13/2022   GERD (gastroesophageal reflux disease)    History of angina    History of penile implant    History of prostate cancer    Hyperlipidemia    Hypertension    Hypothyroidism    Lipoma    MRSA bacteremia 02/2011   Nephrolithiasis    OSA on CPAP    Pneumonia    as a teenager    Medications:  Medications Prior to Admission  Medication Sig Dispense Refill Last Dose   Apoaequorin (PREVAGEN) 10 MG CAPS Take 10 mg by mouth daily.   12/06/2022   ARTIFICIAL TEAR SOLUTION OP Place 1 drop into both eyes 2 (two) times daily as needed (dry eyes).    unk   aspirin 81 MG tablet Take 81 mg by mouth daily.    12/06/2022   brimonidine (ALPHAGAN) 0.2 % ophthalmic solution Place 1 drop into the left eye 2 (two) times daily.   12/06/2022   Calcium Carbonate-Vit D-Min (CALCIUM 1200 PO) Take by mouth.   12/06/2022   cyanocobalamin (VITAMIN B12) 1000 MCG tablet Take 1 tablet (1,000 mcg total) by mouth daily. 30 tablet 2 12/06/2022   donepezil (ARICEPT) 10 MG tablet TAKE 1 TABLET BY MOUTH AT BEDTIME 90 tablet 4 12/06/2022   fluticasone (FLONASE) 50 MCG/ACT nasal spray Place 2 sprays into both nostrils daily as needed for allergies. 16 g 5 unk   isosorbide mononitrate (IMDUR) 60 MG 24 hr tablet TAKE ONE TABLET EVERY DAY 90 tablet 3 12/06/2022   ketoconazole (NIZORAL) 2 % cream APPLY TO GLANS OF PENIS DAILY 30 g 0 unk  levothyroxine (SYNTHROID) 88 MCG tablet TAKE 1 TABLET EVERY DAY ON EMPTY STOMACHWITH A GLASS OF WATER AT LEAST 30-60 MINBEFORE BREAKFAST 90 tablet 0 12/06/2022   memantine (NAMENDA) 10 MG tablet TAKE 1 TABLET BY MOUTH TWO TIMES DAILY 180 tablet 4 12/06/2022   metoprolol succinate (TOPROL-XL) 25 MG 24 hr tablet Take 12.5 mg by mouth daily.   12/06/2022 at 2100   MULTIPLE VITAMIN PO Take 1 tablet by mouth daily.   12/06/2022   pravastatin (PRAVACHOL) 40 MG tablet TAKE 1 TABLET BY MOUTH DAILY 90 tablet 4 12/06/2022   NON FORMULARY CPAP (Free Text) - Historical Medication  As directed  Started 22-Sep-1994 Active      PREVIDENT 5000 DRY MOUTH 1.1 % GEL dental gel Place 1 application onto teeth 2 (two) times daily.       Assessment: Roger Cook is a 89 y.o. male presenting after syncopal event. PMH significant for dementia, HTN, HLD, hypothyroidism, history of TIA, history of prostate cancer. Patient was not on [facility] PTA per chart review. Patient was hospitalized 2/23-2/27 for symptomatic anemia (Hgb 6.0). CTA in the ED revealed bilateral PE with CT evidence of R heart strain (RV/LV 1.03). Pharmacy has been consulted to initiate and manage heparin infusion.    Baseline Labs: aPTT 28, PT 15.1, INR 1.2, Hgb 9.0, Hct 30.1, Plt 341   Goal of Therapy:  Heparin level 0.3-0.7 units/ml Monitor platelets by anticoagulation protocol: Yes  Date Time HL Rate/Comment  3/17 0424 0.66 1100/therapeutic x1 3/17 1220 0.66 1100/therapeutic x2  Plan:  Continue heparin infusion at 1100 units/hr Check HL daily while on heparin  Continue to monitor H&H and platelets daily while on heparin infusion   Gretel Acre, PharmD PGY1 Pharmacy Resident 12/07/2022 1:17 PM

## 2022-12-07 NOTE — Progress Notes (Signed)
Progress Note   Patient: Roger Cook DOB: [birth year] DOA: 12/06/2022     0 DOS: the patient was seen and examined on 12/07/2022     Subjective:  Patient seen and examined at bedside this morning Denies worsening shortness of breath or chest pains Denies nausea vomiting abdominal pain Remains on heparin drip given bilateral PE Awaiting echocardiogram  Brief hospital course:  Roger Cook is a 89 y.o. male with medical history significant for dementia, hypertension, hyperlipidemia, hypothyroid, history of TIA, history of prostate cancer, hospitalized from 2/23 to 2/27 with symptomatic anemia, hemoglobin 6.0, after presenting with a fall who presents to the ED following a near syncopal event while walking down his driveway.  ED course and data review: Tachypneic to the mid 20s in the ED with otherwise normal vitals and not hypoxic.   CTA chest shows bilateral PE as follows: Patient started on a heparin infusion.  Hospitalist consulted for admission.    Bilateral pulmonary embolism (HCC) with possible right heart strain Possibly provoked.  Patient was recently hospitalized and has a history of prostate cancer Currently not hypoxic or tachycardic Continue heparin infusion Awaiting echocardiogram to rule out right heart strain given significant bilateral PE We will hopefully transition to oral anticoagulants soon     Chronic anemia History of  AVMs on colonoscopy 2023, normal EGD 10/2022 History of right renal hematoma History of severe anemia, hemoglobin 6.0, 10/2022 s/p transfusion Appears stable over the past month 8.7> 9.0 Recently hospitalized February 2024 for hemoglobin of 6.0 with unremarkable EGD -Monitor hemoglobin and other patient is on systemic anticoagulation   Postural dizziness with presyncope Secondary to syncope Neurologic checks and fall precautions   Chronic kidney disease, stage 3a ([place]) Renal function stable   Dementia ([place]) Continue  donepezil and memantine Delirium precautions   Hypothyroidism Continue levothyroxine   Severe obstructive sleep apnea Continue CPAP   H/O malignant neoplasm of prostate No acute issues suspected, outpatient follow-up   Essential hypertension Continue home antihypertensives    DVT prophylaxis: Heparin infusion   Consults: none   Advance Care Planning:   Code Status: Prior    Family Communication: none at bedside   Disposition Plan: Back to previous home environment    Physical Exam Vitals and nursing note reviewed.  Constitutional:      General: He is not in acute distress. HENT:     Head: Normocephalic and atraumatic.  Cardiovascular:     Rate and Rhythm: Normal rate and regular rhythm.     Heart sounds: Normal heart sounds.  Pulmonary:     Effort: Pulmonary effort is normal.     Breath sounds: Normal breath sounds.  Abdominal:     Palpations: Abdomen is soft.     Tenderness: There is no abdominal tenderness.  Neurological:     Mental Status: Mental status is at baseline.   Vitals:   12/06/22 2315 12/06/22 2320 12/07/22 0038 12/07/22 0932  BP:   (!) 174/64 (!) 189/54  Pulse:  77 75 (!) 58  Resp:  (!) 33 20 (!) 24  Temp: 97.8 F (36.6 C)  97.7 F (36.5 C) (!) 97.5 F (36.4 C)  TempSrc: Oral   Oral  SpO2:  99% 94% 98%  Weight:      Height:        Data Reviewed: Personally reviewed patient's CBC showing hemoglobin of 8.4  Disposition: Status is: Observation   Author: Verline Lema, MD 12/07/2022 11:58 AM  For on call review  [email or web address]

## 2022-12-08 ENCOUNTER — Inpatient Hospital Stay (HOSPITAL_COMMUNITY)
Admit: 2022-12-08 | Discharge: 2022-12-08 | Disposition: A | Discharge status: Home / Self Care | Payer: Medicare Other | Attending: Internal Medicine | Admitting: Internal Medicine

## 2022-12-08 ENCOUNTER — Inpatient Hospital Stay: Payer: Medicare Other

## 2022-12-08 DIAGNOSIS — I2699 Other pulmonary embolism without acute cor pulmonale: Secondary | ICD-10-CM | POA: Diagnosis not present

## 2022-12-08 DIAGNOSIS — I2609 Other pulmonary embolism with acute cor pulmonale: Secondary | ICD-10-CM | POA: Diagnosis not present

## 2022-12-08 LAB — ECHOCARDIOGRAM COMPLETE
AR max vel: 2.07 cm2
AV Area VTI: 2.24 cm2
AV Area mean vel: 2.01 cm2
AV Mean grad: 4 mmHg
AV Peak grad: 8.2 mmHg
Ao pk vel: 1.43 m/s
Area-P 1/2: 2.69 cm2
Height: 67 in
MV VTI: 2.68 cm2
S' Lateral: 3 cm
Weight: 2400 oz

## 2022-12-08 LAB — CBC
HCT: 30.1 % — ABNORMAL LOW (ref 39.0–52.0)
Hemoglobin: 9.1 g/dL — ABNORMAL LOW (ref 13.0–17.0)
MCH: 24.3 pg — ABNORMAL LOW (ref 26.0–34.0)
MCHC: 30.2 g/dL (ref 30.0–36.0)
MCV: 80.5 fL (ref 80.0–100.0)
Platelets: 263 10*3/uL (ref 150–400)
RBC: 3.74 MIL/uL — ABNORMAL LOW (ref 4.22–5.81)
RDW: 17.2 % — ABNORMAL HIGH (ref 11.5–15.5)
WBC: 6.3 10*3/uL (ref 4.0–10.5)
nRBC: 0 % (ref 0.0–0.2)

## 2022-12-08 LAB — HEPARIN LEVEL (UNFRACTIONATED)
Heparin Unfractionated: 0.91 IU/mL — ABNORMAL HIGH (ref 0.30–0.70)
Heparin Unfractionated: 0.67 IU/mL (ref 0.30–0.70)
Heparin Unfractionated: 0.51 IU/mL (ref 0.30–0.70)

## 2022-12-08 MED ORDER — FERROUS SULFATE 325 (65 FE) MG PO TABS
325.0000 mg | ORAL_TABLET | Freq: Two times a day (BID) | ORAL | Status: DC
  Administered 2022-12-08 – 2022-12-09 (×2): 325 mg via ORAL
  Filled 2022-12-08 (×2): qty 1

## 2022-12-08 MED ORDER — HYDRALAZINE HCL 50 MG PO TABS
50.0000 mg | ORAL_TABLET | Freq: Three times a day (TID) | ORAL | Status: DC
  Administered 2022-12-08 – 2022-12-09 (×4): 50 mg via ORAL
  Filled 2022-12-08 (×4): qty 1

## 2022-12-08 MED ORDER — GUAIFENESIN ER 600 MG PO TB12
1200.0000 mg | ORAL_TABLET | Freq: Two times a day (BID) | ORAL | Status: DC
  Administered 2022-12-08 – 2022-12-09 (×2): 1200 mg via ORAL
  Filled 2022-12-08 (×3): qty 2

## 2022-12-08 MED ORDER — AMLODIPINE BESYLATE 10 MG PO TABS
10.0000 mg | ORAL_TABLET | Freq: Every day | ORAL | Status: DC
  Administered 2022-12-08 – 2022-12-09 (×2): 10 mg via ORAL
  Filled 2022-12-08 (×2): qty 1

## 2022-12-08 MED ORDER — VITAMIN B-12 1000 MCG PO TABS
1000.0000 ug | ORAL_TABLET | Freq: Every day | ORAL | Status: DC
  Administered 2022-12-08 – 2022-12-09 (×2): 1000 ug via ORAL
  Filled 2022-12-08 (×2): qty 1

## 2022-12-08 MED ORDER — HYDRALAZINE HCL 20 MG/ML IJ SOLN
10.0000 mg | INTRAMUSCULAR | Status: DC | PRN
  Administered 2022-12-08: 10 mg via INTRAVENOUS
  Filled 2022-12-08: qty 1

## 2022-12-08 NOTE — TOC Initial Note (Signed)
Transition of Care [facility]) - Initial/Assessment Note    Patient Details  Name: Roger Cook MRN: [id number] Date of Birth: 01-Dec-1926  Transition of [facility]) CM/SW Contact:    Ross Ludwig, LCSW Phone Number: 12/08/2022, 12:02 PM  Clinical Narrative:                  Patient is a 89 year old male who lives with his wife.  Patient is alert and oriented x4.  Patient plans to return back home with home health services.  Patient is currently open to [facility] PT, OT, and RN.  Patient does have a PCP Lelon Huh.  TOC to continue to follow patient's progress throughout discharge planning.  Expected Discharge Plan:  Barriers to Discharge: Continued Medical Work up   Patient Goals and CMS Choice Patient states their goals for this hospitalization and ongoing recovery are:: To return back home with home health services. CMS Medicare.gov Compare Post Acute Care list provided to:: Patient Choice offered to / list presented to : Patient [place] ownership interest in [facility].provided to:: Patient    Expected Discharge Plan and Services     Post Acute Care Choice: Home Health                             HH Arranged: OT, PT, RN [facility] Agency: [place] ([facility]) Date [facility] Agency Contacted: 12/08/22 Time [place]: 37 Representative spoke with at [place]: [place] Arrangements/Services   Lives with:: Spouse Patient language and need for interpreter reviewed:: Yes Do you feel safe going back to the place where you live?: Yes      Need for Family Participation in Patient Care: No (Comment) Care giver support system in place?: Yes (comment) Current home services: Home PT, Home RN, Home OT Criminal Activity/Legal Involvement Pertinent to Current Situation/Hospitalization: No - Comment as needed  Activities of Daily Living Home Assistive Devices/Equipment: Cane (specify quad or straight) ADL  Screening (condition at time of admission) Patient's cognitive ability adequate to safely complete daily activities?: Yes Is the patient deaf or have difficulty hearing?: Yes Does the patient have difficulty seeing, even when wearing glasses/contacts?: Yes Does the patient have difficulty concentrating, remembering, or making decisions?: Yes Patient able to express need for assistance with ADLs?: Yes Does the patient have difficulty dressing or bathing?: Yes Independently performs ADLs?: No Does the patient have difficulty walking or climbing stairs?: Yes Weakness of Legs: Both Weakness of Arms/Hands: None  Permission Sought/Granted Permission sought to share information with : Case Manager, Family Supports Permission granted to share information with : Yes, Verbal Permission Granted  Share Information with NAME: Ryu, Asebedo [id number]  [phone]  welborn,patty Daughter   [phone]  Sutton,Jennifer Daughter   [phone]  Permission granted to share info w AGENCY: Home health agency        Emotional Assessment Appearance:: Appears stated age   Affect (typically observed): Accepting, Appropriate Orientation: : Oriented to Self, Oriented to Place, Oriented to  Time, Oriented to Situation Alcohol / Substance Use: Not Applicable Psych Involvement: No (comment)  Admission diagnosis:  Bilateral pulmonary embolism (HCC) [I26.99] Near syncope [R55] Other acute pulmonary embolism, unspecified whether acute cor pulmonale present [facility]) [I26.99] Patient Active Problem List   Diagnosis Date Noted   Bilateral pulmonary embolism ([place]) 12/06/2022   Chronic kidney disease, stage 3a ([place]) 12/06/2022  Postural dizziness with presyncope 12/06/2022   Vitamin B12 deficiency 11/17/2022   Generalized weakness 11/17/2022   Positive blood culture 11/15/2022   Chronic anemia 11/13/2022   AKI (acute kidney injury) ([place]) 07/10/2022   Hyperglycemia 07/09/2022   Iron deficiency anemia  due to chronic blood loss    Angiodysplasia of intestinal tract    Severe anemia 12/24/2021   Angina pectoris ([place]) 09/18/2020   Dementia ([place]) 09/18/2020   Renal hematoma, right, subsequent encounter 04/25/2018   Localized osteoporosis of spine 12/23/2017   Constipation 11/06/2016   Bell's palsy 02/26/2015   Nephrogenic adenoma of bladder 123456   Folliculitis 123456   Personal history of methicillin resistant Staphylococcus aureus 02/26/2015   Fatty tumor 02/26/2015   Syncope and collapse 02/26/2015   Fungal infection of nail 02/26/2015   H/O malignant neoplasm of prostate 02/26/2015   Altered blood in stool 10/18/2012   Arteriosclerosis of coronary artery 01/05/2009   Severe obstructive sleep apnea 01/05/2009   Hypothyroidism 01/05/2009   Essential hypertension 09/22/1998   PCP:  [name], MD Pharmacy:   [place], [place] - [address] [place] [address] Phone: [phone] Fax: [phone]     Social Determinants of Health (SDOH) Social History: SDOH Screenings   Food Insecurity: No Food Insecurity (12/07/2022)  Housing: Low Risk  (12/07/2022)  Transportation Needs: No Transportation Needs (12/07/2022)  Utilities: Not At Risk (12/07/2022)  Alcohol Screen: Low Risk  (12/23/2021)  Depression (PHQ2-9): Low Risk  (10/13/2022)  Financial Resource Strain: Low Risk  (02/07/2020)  Physical Activity: Sufficiently Active (02/07/2020)  Social Connections: Socially Integrated (02/07/2020)  Stress: No Stress Concern Present (02/07/2020)  Tobacco Use: Medium Risk (12/06/2022)   SDOH Interventions:     Readmission Risk Interventions     No data to display

## 2022-12-08 NOTE — Progress Notes (Signed)
ANTICOAGULATION CONSULT NOTE  Pharmacy Consult for Heparin Infusion Indication: pulmonary embolus  Allergies  Allergen Reactions   Levofloxacin Swelling    lips swelling   Povidone Iodine Other (See Comments)    Unknown, can not remember   Sulfa Antibiotics     Unknown, can not remember   Benadryl [Diphenhydramine] Rash    Patient Measurements: Height: 5\' 7"  (170.2 cm) Weight: 68 kg (150 lb) IBW/kg (Calculated) : 66.1 Heparin Dosing Weight: 68 kg   Vital Signs: Temp: 97.6 F (36.4 C) (03/18 1522) BP: 159/45 (03/18 1522) Pulse Rate: 86 (03/18 1522)  Labs: Recent Labs    12/06/22 1752 12/06/22 1752 12/06/22 2216 12/06/22 2301 12/07/22 0424 12/07/22 1220 12/08/22 0453 12/08/22 1412  HGB 9.0*  --   --   --  8.4*  --  9.1*  --   HCT 30.1*  --   --   --  27.4*  --  30.1*  --   PLT 341  --   --   --  247  --  263  --   APTT  --   --   --  28  --   --   --   --   LABPROT  --   --   --  15.1  --   --   --   --   INR  --   --   --  1.2  --   --   --   --   HEPARINUNFRC  --    < >  --   --  0.66 0.66 0.91* 0.67  CREATININE 1.53*  --   --   --   --   --   --   --   TROPONINIHS 8  --  9  --   --   --   --   --    < > = values in this interval not displayed.     Estimated Creatinine Clearance: 28.2 mL/min (A) (by C-G formula based on SCr of 1.53 mg/dL (H)).   Medical History: Past Medical History:  Diagnosis Date   Anemia    Aortic atherosclerosis (HCC)    Bell palsy    Bilateral renal cysts    Bladder tumor    CAD (coronary artery disease)    DOE (dyspnea on exertion)    Elevated lactic acid level 11/13/2022   GERD (gastroesophageal reflux disease)    History of angina    History of penile implant    History of prostate cancer    Hyperlipidemia    Hypertension    Hypothyroidism    Lipoma    MRSA bacteremia 02/2011   Nephrolithiasis    OSA on CPAP    Pneumonia    as a teenager    Medications:  Medications Prior to Admission  Medication Sig Dispense  Refill Last Dose   Apoaequorin (PREVAGEN) 10 MG CAPS Take 10 mg by mouth daily.   12/06/2022   ARTIFICIAL TEAR SOLUTION OP Place 1 drop into both eyes 2 (two) times daily as needed (dry eyes).   unk   aspirin 81 MG tablet Take 81 mg by mouth daily.    12/06/2022   brimonidine (ALPHAGAN) 0.2 % ophthalmic solution Place 1 drop into the left eye 2 (two) times daily.   12/06/2022   Calcium Carbonate-Vit D-Min (CALCIUM 1200 PO) Take by mouth.   12/06/2022   cyanocobalamin (VITAMIN B12) 1000 MCG tablet Take 1 tablet (1,000 mcg total) by mouth  daily. 30 tablet 2 12/06/2022   donepezil (ARICEPT) 10 MG tablet TAKE 1 TABLET BY MOUTH AT BEDTIME 90 tablet 4 12/06/2022   fluticasone (FLONASE) 50 MCG/ACT nasal spray Place 2 sprays into both nostrils daily as needed for allergies. 16 g 5 unk   isosorbide mononitrate (IMDUR) 60 MG 24 hr tablet TAKE ONE TABLET EVERY DAY 90 tablet 3 12/06/2022   ketoconazole (NIZORAL) 2 % cream APPLY TO GLANS OF PENIS DAILY 30 g 0 unk   levothyroxine (SYNTHROID) 88 MCG tablet TAKE 1 TABLET EVERY DAY ON EMPTY STOMACHWITH A GLASS OF WATER AT LEAST 30-60 MINBEFORE BREAKFAST 90 tablet 0 12/06/2022   memantine (NAMENDA) 10 MG tablet TAKE 1 TABLET BY MOUTH TWO TIMES DAILY 180 tablet 4 12/06/2022   metoprolol succinate (TOPROL-XL) 25 MG 24 hr tablet Take 12.5 mg by mouth daily.   12/06/2022 at 2100   MULTIPLE VITAMIN PO Take 1 tablet by mouth daily.   12/06/2022   pravastatin (PRAVACHOL) 40 MG tablet TAKE 1 TABLET BY MOUTH DAILY 90 tablet 4 12/06/2022   NON FORMULARY CPAP (Free Text) - Historical Medication  As directed  Started 22-Sep-1994 Active      PREVIDENT 5000 DRY MOUTH 1.1 % GEL dental gel Place 1 application onto teeth 2 (two) times daily.       Assessment: Roger Cook is a 89 y.o. male presenting after syncopal event. PMH significant for dementia, HTN, HLD, hypothyroidism, history of TIA, history of prostate cancer. Patient was not on [facility] PTA per chart review. Patient was hospitalized  2/23-2/27 for symptomatic anemia (Hgb 6.0). CTA in the ED revealed bilateral PE with CT evidence of R heart strain (RV/LV 1.03). Pharmacy has been consulted to initiate and manage heparin infusion.   Baseline Labs: aPTT 28, PT 15.1, INR 1.2, Hgb 9.0, Hct 30.1, Plt 341   Goal of Therapy:  Heparin level 0.3-0.7 units/ml Monitor platelets by anticoagulation protocol: Yes  Date Time HL Rate/Comment  3/17 0424 0.66 1100/therapeutic x1 3/17 1220 0.66 1100/therapeutic x2 3/18     0453   0.91      1100 units/hr , elevated  3/18 1412 0.67 1000/therapeutic   Plan:  Heparin therapeutic - Will continue heparin drip rate at 1000 units/hr.  - Will recheck HL 8 hrs to confirm  Dorothe Pea 12/08/2022 3:33 PM

## 2022-12-08 NOTE — Progress Notes (Signed)
ANTICOAGULATION CONSULT NOTE  Pharmacy Consult for Heparin Infusion Indication: pulmonary embolus  Allergies  Allergen Reactions   Levofloxacin Swelling    lips swelling   Povidone Iodine Other (See Comments)    Unknown, can not remember   Sulfa Antibiotics     Unknown, can not remember   Benadryl [Diphenhydramine] Rash    Patient Measurements: Height: 5\' 7"  (170.2 cm) Weight: 68 kg (150 lb) IBW/kg (Calculated) : 66.1 Heparin Dosing Weight: 68 kg   Vital Signs: Temp: 98.1 F (36.7 C) (03/18 0423) Temp Source: Oral (03/17 1916) BP: 181/53 (03/18 0423) Pulse Rate: 59 (03/18 0423)  Labs: Recent Labs    12/06/22 1752 12/06/22 2216 12/06/22 2301 12/07/22 0424 12/07/22 1220 12/08/22 0453  HGB 9.0*  --   --  8.4*  --  9.1*  HCT 30.1*  --   --  27.4*  --  30.1*  PLT 341  --   --  247  --  263  APTT  --   --  28  --   --   --   LABPROT  --   --  15.1  --   --   --   INR  --   --  1.2  --   --   --   HEPARINUNFRC  --   --   --  0.66 0.66 0.91*  CREATININE 1.53*  --   --   --   --   --   TROPONINIHS 8 9  --   --   --   --      Estimated Creatinine Clearance: 28.2 mL/min (A) (by C-G formula based on SCr of 1.53 mg/dL (H)).   Medical History: Past Medical History:  Diagnosis Date   Anemia    Aortic atherosclerosis (HCC)    Bell palsy    Bilateral renal cysts    Bladder tumor    CAD (coronary artery disease)    DOE (dyspnea on exertion)    Elevated lactic acid level 11/13/2022   GERD (gastroesophageal reflux disease)    History of angina    History of penile implant    History of prostate cancer    Hyperlipidemia    Hypertension    Hypothyroidism    Lipoma    MRSA bacteremia 02/2011   Nephrolithiasis    OSA on CPAP    Pneumonia    as a teenager    Medications:  Medications Prior to Admission  Medication Sig Dispense Refill Last Dose   Apoaequorin (PREVAGEN) 10 MG CAPS Take 10 mg by mouth daily.   12/06/2022   ARTIFICIAL TEAR SOLUTION OP Place 1 drop  into both eyes 2 (two) times daily as needed (dry eyes).   unk   aspirin 81 MG tablet Take 81 mg by mouth daily.    12/06/2022   brimonidine (ALPHAGAN) 0.2 % ophthalmic solution Place 1 drop into the left eye 2 (two) times daily.   12/06/2022   Calcium Carbonate-Vit D-Min (CALCIUM 1200 PO) Take by mouth.   12/06/2022   cyanocobalamin (VITAMIN B12) 1000 MCG tablet Take 1 tablet (1,000 mcg total) by mouth daily. 30 tablet 2 12/06/2022   donepezil (ARICEPT) 10 MG tablet TAKE 1 TABLET BY MOUTH AT BEDTIME 90 tablet 4 12/06/2022   fluticasone (FLONASE) 50 MCG/ACT nasal spray Place 2 sprays into both nostrils daily as needed for allergies. 16 g 5 unk   isosorbide mononitrate (IMDUR) 60 MG 24 hr tablet TAKE ONE TABLET EVERY DAY 90 tablet  3 12/06/2022   ketoconazole (NIZORAL) 2 % cream APPLY TO GLANS OF PENIS DAILY 30 g 0 unk   levothyroxine (SYNTHROID) 88 MCG tablet TAKE 1 TABLET EVERY DAY ON EMPTY STOMACHWITH A GLASS OF WATER AT LEAST 30-60 MINBEFORE BREAKFAST 90 tablet 0 12/06/2022   memantine (NAMENDA) 10 MG tablet TAKE 1 TABLET BY MOUTH TWO TIMES DAILY 180 tablet 4 12/06/2022   metoprolol succinate (TOPROL-XL) 25 MG 24 hr tablet Take 12.5 mg by mouth daily.   12/06/2022 at 2100   MULTIPLE VITAMIN PO Take 1 tablet by mouth daily.   12/06/2022   pravastatin (PRAVACHOL) 40 MG tablet TAKE 1 TABLET BY MOUTH DAILY 90 tablet 4 12/06/2022   NON FORMULARY CPAP (Free Text) - Historical Medication  As directed  Started 22-Sep-1994 Active      PREVIDENT 5000 DRY MOUTH 1.1 % GEL dental gel Place 1 application onto teeth 2 (two) times daily.       Assessment: ZYION SMIALEK is a 89 y.o. male presenting after syncopal event. PMH significant for dementia, HTN, HLD, hypothyroidism, history of TIA, history of prostate cancer. Patient was not on [facility] PTA per chart review. Patient was hospitalized 2/23-2/27 for symptomatic anemia (Hgb 6.0). CTA in the ED revealed bilateral PE with CT evidence of R heart strain (RV/LV 1.03). Pharmacy  has been consulted to initiate and manage heparin infusion.   Baseline Labs: aPTT 28, PT 15.1, INR 1.2, Hgb 9.0, Hct 30.1, Plt 341   Goal of Therapy:  Heparin level 0.3-0.7 units/ml Monitor platelets by anticoagulation protocol: Yes  Date Time HL Rate/Comment  3/17 0424 0.66 1100/therapeutic x1 3/17 1220 0.66 1100/therapeutic x2 3/18     0453   0.91      1100 units/hr , elevated   Plan:  3/18:  HL @ 0453 = 0.91, elevated - Will decrease heparin drip rate to 1000 units/hr.  - Will recheck HL 8 hrs after rate change   Kadee Philyaw D 12/08/2022 6:05 AM

## 2022-12-08 NOTE — Progress Notes (Signed)
*  PRELIMINARY RESULTS* Echocardiogram 2D Echocardiogram has been performed.  Elpidio Anis 12/08/2022, 11:24 AM

## 2022-12-08 NOTE — Consult Note (Signed)
Hospital Consult    Reason for Consult:  Pulmonary Embolism Requesting Physician:  Dr Marguerita Merles MD MRN #:  [id number]  History of Present Illness: This is a 89 y.o. male with medical history significant for dementia, hypertension, hyperlipidemia, hypothyroid, history of TIA, history of prostate cancer, hospitalized from 2/23 to 2/27 with symptomatic anemia, hemoglobin 6.0, after presenting with a fall who presents to the ED following a near syncopal event while walking down his driveway.  His recent stay he had a negative EGD but his anemia was of presumed GI etiology given 1 known history of AVMs on colonoscopy from colonoscopy in April 2023.  He received a unit of PRBCs to hemoglobin of 8.7.  Patient was in his usual state of health.  He endorses mild shortness of breath.  He denies palpitations, chest pain, one-sided weakness numbness or tingling.  He denies nausea or vomiting.  Denies black or bloody stool.  Denies cough, fever or chills.   On exam today patient is resting comfortably in bed.  Normal respiratory effort with no shortness of breath.  Patient is not on oxygen.  Patient states he came into the hospital because he passed out.  They did a CT of his chest and they found clots in his lungs.  Denies ever having clots in his lungs.  CT of the chest shows bilateral pulmonary embolism with RV/LV ratio of 1.03.  He also has bilateral lower lobe atelectasis.  Patient is on a heparin infusion.  Skin surgery was consulted to review bilateral pulmonary embolisms.  Past Medical History:  Diagnosis Date   Anemia    Aortic atherosclerosis (HCC)    Bell palsy    Bilateral renal cysts    Bladder tumor    CAD (coronary artery disease)    DOE (dyspnea on exertion)    Elevated lactic acid level 11/13/2022   GERD (gastroesophageal reflux disease)    History of angina    History of penile implant    History of prostate cancer    Hyperlipidemia    Hypertension    Hypothyroidism    Lipoma     MRSA bacteremia 02/2011   Nephrolithiasis    OSA on CPAP    Pneumonia    as a teenager    Past Surgical History:  Procedure Laterality Date   APPENDECTOMY  2005   CARDIAC CATHETERIZATION N/A 08/13/1990   75% pLCx, 75% mLCx, 25% mLAD, 75% D2, 50% right renal artery; Location: [facility]; Surgeon: Doristine Bosworth, MD   CATARACT EXTRACTION  2005   also had a macular hole in 2005   CATARACT EXTRACTION  2008   COLONOSCOPY     2003, 2014   COLONOSCOPY WITH PROPOFOL N/A 12/26/2021   Procedure: COLONOSCOPY WITH PROPOFOL;  Surgeon: Lucilla Lame, MD;  Location: [facility] ENDOSCOPY;  Service: Endoscopy;  Laterality: N/A;   ESOPHAGOGASTRODUODENOSCOPY     2004, 2014   ESOPHAGOGASTRODUODENOSCOPY (EGD) WITH PROPOFOL N/A 12/26/2021   Procedure: ESOPHAGOGASTRODUODENOSCOPY (EGD) WITH PROPOFOL;  Surgeon: Lucilla Lame, MD;  Location: [facility] ENDOSCOPY;  Service: Endoscopy;  Laterality: N/A;   ESOPHAGOGASTRODUODENOSCOPY (EGD) WITH PROPOFOL N/A 11/15/2022   Procedure: ESOPHAGOGASTRODUODENOSCOPY (EGD) WITH PROPOFOL;  Surgeon: Lesly Rubenstein, MD;  Location: [facility] ENDOSCOPY;  Service: Endoscopy;  Laterality: N/A;   HAND SURGERY Left 06/26/2011   Malignancy removed from left hand   [place]   penile inplant  1984   polyp of rectum  2003   PROSTATE SURGERY  1975   Abdominal, had to have radiation treatment with the procedure   [place] TUMOR N/A 03/12/2021   Procedure: TRANSURETHRAL RESECTION OF BLADDER TUMOR (TURBT);  Surgeon: Abbie Sons, MD;  Location: [facility] ORS;  Service: Urology;  Laterality: N/A;    Allergies  Allergen Reactions   Levofloxacin Swelling    lips swelling   Povidone Iodine Other (See Comments)    Unknown, can not remember   Sulfa Antibiotics     Unknown, can not remember   Benadryl [Diphenhydramine] Rash    Prior to Admission medications   Medication Sig Start Date End Date  Taking? Authorizing Provider  Apoaequorin (PREVAGEN) 10 MG CAPS Take 10 mg by mouth daily.   Yes [provider]  ARTIFICIAL TEAR SOLUTION OP Place 1 drop into both eyes 2 (two) times daily as needed (dry eyes).   Yes [provider]  aspirin 81 MG tablet Take 81 mg by mouth daily.  01/05/09  Yes [provider]  brimonidine (ALPHAGAN) 0.2 % ophthalmic solution Place 1 drop into the left eye 2 (two) times daily. 01/25/21  Yes [provider]  Calcium Carbonate-Vit D-Min (CALCIUM 1200 PO) Take by mouth.   Yes [provider]  cyanocobalamin (VITAMIN B12) 1000 MCG tablet Take 1 tablet (1,000 mcg total) by mouth daily. 11/18/22  Yes Nicole Kindred A, DO  donepezil (ARICEPT) 10 MG tablet TAKE 1 TABLET BY MOUTH AT BEDTIME 06/20/21  Yes [name], MD  fluticasone [facility]) 50 MCG/ACT nasal spray Place 2 sprays into both nostrils daily as needed for allergies. 09/25/21  Yes [name], MD  isosorbide mononitrate (IMDUR) 60 MG 24 hr tablet TAKE ONE TABLET EVERY DAY 10/20/22  Yes [name], MD  ketoconazole (NIZORAL) 2 % cream APPLY TO GLANS OF PENIS DAILY 10/08/22  Yes [name], MD  levothyroxine (SYNTHROID) 88 MCG tablet TAKE 1 TABLET EVERY DAY ON EMPTY STOMACHWITH A GLASS OF WATER AT LEAST 30-60 MINBEFORE BREAKFAST 10/29/22  Yes [name], MD  memantine (NAMENDA) 10 MG tablet TAKE 1 TABLET BY MOUTH TWO TIMES DAILY 11/19/21  Yes [name], MD  metoprolol succinate (TOPROL-XL) 25 MG 24 hr tablet Take 12.5 mg by mouth daily. 01/25/21  Yes [provider]  MULTIPLE VITAMIN PO Take 1 tablet by mouth daily.   Yes [provider]  pravastatin (PRAVACHOL) 40 MG tablet TAKE 1 TABLET BY MOUTH DAILY 11/24/21  Yes [name], MD  NON FORMULARY CPAP (Free Text) - Historical Medication  As directed  Started 22-Sep-1994 Active 09/22/1994   [provider]  PREVIDENT 5000 DRY MOUTH 1.1 % GEL dental gel Place 1  application onto teeth 2 (two) times daily. 02/19/21   [provider]    Social History   Socioeconomic History   Marital status: Married    Spouse name: Roger Cook   Number of children: 2   Years of education: Not on file   Highest education level: Bachelor's degree (e.g., BA, AB, BS)  Occupational History   Occupation: retired  Tobacco Use   Smoking status: Former    Packs/day: 1.00    Years: 16.00    Additional pack years: 0.00    Total pack years: 16.00    Types: Cigarettes    Quit date: 1964    Years since quitting: 60.2   Smokeless tobacco: Never  Vaping Use   Vaping  Use: Never used  Substance and Sexual Activity   Alcohol use: Not Currently    Alcohol/week: 2.0 standard drinks of alcohol    Types: 2 Glasses of wine per week   Drug use: No   Sexual activity: Yes  Other Topics Concern   Not on file  Social History Narrative   Not on file   Social Determinants of Health   Financial Resource Strain: Low Risk  (02/07/2020)   Overall Financial Resource Strain (CARDIA)    Difficulty of Paying Living Expenses: Not hard at all  Food Insecurity: No Food Insecurity (12/07/2022)   Hunger Vital Sign    Worried About Running Out of Food in the Last Year: Never true    Ran Out of Food in the Last Year: Never true  Transportation Needs: No Transportation Needs (12/07/2022)   PRAPARE - Hydrologist (Medical): No    Lack of Transportation (Non-Medical): No  Physical Activity: Sufficiently Active (02/07/2020)   Exercise Vital Sign    Days of Exercise per Week: 6 days    Minutes of Exercise per Session: 40 min  Stress: No Stress Concern Present (02/07/2020)   [place]    Feeling of Stress : Not at all  Social Connections: [place] (02/07/2020)   Social Connection and Isolation Panel [NHANES]    Frequency of Communication with Friends and Family: More than three times  a week    Frequency of Social Gatherings with Friends and Family: Twice a week    Attends Religious Services: More than 4 times per year    Active Member of [employer] or Organizations: Yes    Attends Archivist Meetings: More than 4 times per year    Marital Status: Married  Human resources officer Violence: Not At Risk (12/07/2022)   Humiliation, Afraid, Rape, and Kick questionnaire    Fear of Current or Ex-Partner: No    Emotionally Abused: No    Physically Abused: No    Sexually Abused: No     Family History  Problem Relation Age of Onset   Lung cancer Mother    Heart disease Father     ROS: Otherwise negative unless mentioned in HPI  Physical Examination  Vitals:   12/08/22 1156 12/08/22 1522  BP: (!) 171/51 (!) 159/45  Pulse: 82 86  Resp: (!) 22 18  Temp: (!) 97.5 F (36.4 C) 97.6 F (36.4 C)  SpO2: 100% 97%   Body mass index is 23.49 kg/m.  General:  WDWN in NAD Gait: Not observed HENT: WNL, normocephalic Pulmonary: normal non-labored breathing, without Rales, rhonchi,  wheezing diminished in the bases Cardiac: regular, Tachycardic at 100, without  Murmurs, rubs or gallops; without carotid bruits Abdomen: Positive bowel sounds, soft, NT/ND, no masses Skin: without rashes Vascular Exam/Pulses: Palpable Radial, Post tibial pulses bilaterally Extremities: without ischemic changes, without Gangrene , without cellulitis; without open wounds;  Musculoskeletal: no muscle wasting or atrophy. Positive pain on palpation of left calf.   Neurologic: A&O X 3;  No focal weakness or paresthesias are detected; speech is fluent/normal Psychiatric:  The pt has Normal affect. Lymph:  Unremarkable  CBC    Component Value Date/Time   WBC 6.3 12/08/2022 0453   RBC 3.74 (L) 12/08/2022 0453   HGB 9.1 (L) 12/08/2022 0453   HGB 9.9 (L) 03/26/2022 1142   HCT 30.1 (L) 12/08/2022 0453   HCT 31.4 (L) 03/26/2022 1142   PLT 263 12/08/2022  0453   PLT 261 03/26/2022 1142   MCV 80.5  12/08/2022 0453   MCV 87 03/26/2022 1142   MCV 92 10/26/2012 0707   MCH 24.3 (L) 12/08/2022 0453   MCHC 30.2 12/08/2022 0453   RDW 17.2 (H) 12/08/2022 0453   RDW 16.3 (H) 03/26/2022 1142   RDW 13.6 10/26/2012 0707   LYMPHSABS 2.1 12/06/2022 1752   LYMPHSABS 1.5 10/26/2012 0707   MONOABS 0.9 12/06/2022 1752   MONOABS 0.8 10/26/2012 0707   EOSABS 0.2 12/06/2022 1752   EOSABS 0.2 10/26/2012 0707   BASOSABS 0.1 12/06/2022 1752   BASOSABS 0.0 10/26/2012 0707    BMET    Component Value Date/Time   NA 136 12/06/2022 1752   NA 143 07/24/2022 1435   NA 145 10/23/2012 0427   K 4.1 12/06/2022 1752   K 3.4 (L) 10/23/2012 0427   CL 112 (H) 12/06/2022 1752   CL 111 (H) 10/23/2012 0427   CO2 18 (L) 12/06/2022 1752   CO2 25 10/23/2012 0427   GLUCOSE 199 (H) 12/06/2022 1752   GLUCOSE 77 10/23/2012 0427   BUN 31 (H) 12/06/2022 1752   BUN 22 07/24/2022 1435   BUN 18 10/23/2012 0427   CREATININE 1.53 (H) 12/06/2022 1752   CREATININE 1.44 (H) 10/23/2012 0427   CALCIUM 8.4 (L) 12/06/2022 1752   CALCIUM 8.3 (L) 10/23/2012 0427   GFRNONAA 42 (L) 12/06/2022 1752   GFRNONAA 45 (L) 10/23/2012 0427   GFRAA 51 (L) 06/05/2020 1423   GFRAA 52 (L) 10/23/2012 0427    COAGS: Lab Results  Component Value Date   INR 1.2 12/06/2022   INR 1.2 11/13/2022   INR 1.1 07/04/2022     Non-Invasive Vascular Imaging:   EXAM: CT ANGIOGRAPHY CHEST WITH CONTRAST  IMPRESSION: 1. Marked severity bilateral pulmonary embolism with CT evidence of right heart strain (RV/LV ratio of 1.03). 2. Mild bilateral lower lobe atelectasis. 3. Chronic compression fracture deformity of the T4 vertebral body. 4. Aortic atherosclerosis.  Statin:  No. Beta Blocker:  Yes.   Aspirin:  Yes.   ACEI:  No. ARB:  No. CCB use:  Yes Other antiplatelets/anticoagulants:  No.    ASSESSMENT/PLAN: This is a 89 y.o. male presents to [facility] emergency department after an episode of syncope.  Upon further workup patient was found  to have bilateral pulmonary embolisms.  CT scan shows an RV to LV ratio of 1.03 minimal heart strain.  Due to the patient's prior history of GI bleed he has not been anticoagulated.  Now that he is known to have bilateral pulmonary embolisms patient will need to be anticoagulated if he can tolerate it and he remains asymptomatic as he is now.  If the patient can not be a candidate for anticoagulation an IVC filter placement would be recommended as long as he remains asymptomatic with no heart strain.  If the patient' becomes symptomatic vascular surgery would be amenable to doing a pulmonary thrombectomy with IVC placement in the setting that the patient is not a candidate for anticoagulation therapy beyond his daily aspirin.  Patient also had pain on palpation of his left calf. Recommend bilateral lower extremity venous ultrasounds to rule out DVT.   -I discussed in detail the plan with Dr. Leotis Pain MD and he is in agreement with the plan.   Drema Pry [facility] 12/08/2022 4:17 PM

## 2022-12-08 NOTE — Telephone Encounter (Signed)
Left message that he can come on 12/09/22 at 11:20 a.m.  If this is not convenient, they are to call back to resch.

## 2022-12-08 NOTE — Progress Notes (Signed)
ANTICOAGULATION CONSULT NOTE  Pharmacy Consult for Heparin Infusion Indication: pulmonary embolus  Allergies  Allergen Reactions   Levofloxacin Swelling    lips swelling   Povidone Iodine Other (See Comments)    Unknown, can not remember   Sulfa Antibiotics     Unknown, can not remember   Benadryl [Diphenhydramine] Rash    Patient Measurements: Height: 5\' 7"  (170.2 cm) Weight: 68 kg (150 lb) IBW/kg (Calculated) : 66.1 Heparin Dosing Weight: 68 kg   Vital Signs: Temp: 97.8 F (36.6 C) (03/18 2050) Temp Source: Oral (03/18 2050) BP: 160/57 (03/18 2050) Pulse Rate: 72 (03/18 2050)  Labs: Recent Labs    12/06/22 1752 12/06/22 2216 12/06/22 2301 12/07/22 0424 12/07/22 1220 12/08/22 0453 12/08/22 1412 12/08/22 2220  HGB 9.0*  --   --  8.4*  --  9.1*  --   --   HCT 30.1*  --   --  27.4*  --  30.1*  --   --   PLT 341  --   --  247  --  263  --   --   APTT  --   --  28  --   --   --   --   --   LABPROT  --   --  15.1  --   --   --   --   --   INR  --   --  1.2  --   --   --   --   --   HEPARINUNFRC  --   --   --  0.66   < > 0.91* 0.67 0.51  CREATININE 1.53*  --   --   --   --   --   --   --   TROPONINIHS 8 9  --   --   --   --   --   --    < > = values in this interval not displayed.     Estimated Creatinine Clearance: 28.2 mL/min (A) (by C-G formula based on SCr of 1.53 mg/dL (H)).   Medical History: Past Medical History:  Diagnosis Date   Anemia    Aortic atherosclerosis (HCC)    Bell palsy    Bilateral renal cysts    Bladder tumor    CAD (coronary artery disease)    DOE (dyspnea on exertion)    Elevated lactic acid level 11/13/2022   GERD (gastroesophageal reflux disease)    History of angina    History of penile implant    History of prostate cancer    Hyperlipidemia    Hypertension    Hypothyroidism    Lipoma    MRSA bacteremia 02/2011   Nephrolithiasis    OSA on CPAP    Pneumonia    as a teenager    Medications:  Medications Prior to  Admission  Medication Sig Dispense Refill Last Dose   Apoaequorin (PREVAGEN) 10 MG CAPS Take 10 mg by mouth daily.   12/06/2022   ARTIFICIAL TEAR SOLUTION OP Place 1 drop into both eyes 2 (two) times daily as needed (dry eyes).   unk   aspirin 81 MG tablet Take 81 mg by mouth daily.    12/06/2022   brimonidine (ALPHAGAN) 0.2 % ophthalmic solution Place 1 drop into the left eye 2 (two) times daily.   12/06/2022   Calcium Carbonate-Vit D-Min (CALCIUM 1200 PO) Take by mouth.   12/06/2022   cyanocobalamin (VITAMIN B12) 1000 MCG tablet Take 1 tablet (  1,000 mcg total) by mouth daily. 30 tablet 2 12/06/2022   donepezil (ARICEPT) 10 MG tablet TAKE 1 TABLET BY MOUTH AT BEDTIME 90 tablet 4 12/06/2022   fluticasone (FLONASE) 50 MCG/ACT nasal spray Place 2 sprays into both nostrils daily as needed for allergies. 16 g 5 unk   isosorbide mononitrate (IMDUR) 60 MG 24 hr tablet TAKE ONE TABLET EVERY DAY 90 tablet 3 12/06/2022   ketoconazole (NIZORAL) 2 % cream APPLY TO GLANS OF PENIS DAILY 30 g 0 unk   levothyroxine (SYNTHROID) 88 MCG tablet TAKE 1 TABLET EVERY DAY ON EMPTY STOMACHWITH A GLASS OF WATER AT LEAST 30-60 MINBEFORE BREAKFAST 90 tablet 0 12/06/2022   memantine (NAMENDA) 10 MG tablet TAKE 1 TABLET BY MOUTH TWO TIMES DAILY 180 tablet 4 12/06/2022   metoprolol succinate (TOPROL-XL) 25 MG 24 hr tablet Take 12.5 mg by mouth daily.   12/06/2022 at 2100   MULTIPLE VITAMIN PO Take 1 tablet by mouth daily.   12/06/2022   pravastatin (PRAVACHOL) 40 MG tablet TAKE 1 TABLET BY MOUTH DAILY 90 tablet 4 12/06/2022   NON FORMULARY CPAP (Free Text) - Historical Medication  As directed  Started 22-Sep-1994 Active      PREVIDENT 5000 DRY MOUTH 1.1 % GEL dental gel Place 1 application onto teeth 2 (two) times daily.       Assessment: Roger Cook is a 89 y.o. male presenting after syncopal event. PMH significant for dementia, HTN, HLD, hypothyroidism, history of TIA, history of prostate cancer. Patient was not on [facility] PTA per chart  review. Patient was hospitalized 2/23-2/27 for symptomatic anemia (Hgb 6.0). CTA in the ED revealed bilateral PE with CT evidence of R heart strain (RV/LV 1.03). Pharmacy has been consulted to initiate and manage heparin infusion.   Baseline Labs: aPTT 28, PT 15.1, INR 1.2, Hgb 9.0, Hct 30.1, Plt 341   Goal of Therapy:  Heparin level 0.3-0.7 units/ml Monitor platelets by anticoagulation protocol: Yes  Date Time HL Rate/Comment  3/17 0424 0.66 1100/therapeutic x1 3/17 1220 0.66 1100/therapeutic x2 3/18     0453   0.91      1100 units/hr , elevated  3/18 1412 0.67 1000/therapeutic  3/18  2220 0.51 Therapeutic x 2  Plan:  - Will continue heparin drip rate at 1000 units/hr.  - Recheck HL daily w/ AM labs while therapeutic - CBC daily while on heparin  Renda Rolls, PharmD, [facility] 12/08/2022 11:06 PM

## 2022-12-08 NOTE — Progress Notes (Signed)
Progress Note   Patient: Roger Cook [id number] DOB: [birth year] DOA: 12/06/2022     1 DOS: the patient was seen and examined on 12/08/2022    Subjective:  Patient seen and examined at bedside this morning Denies worsening shortness of breath or chest pains echoCardiogram report was reviewed which did not show any right heart strain Given the findings of AVM on recent colonoscopy even though patient does not have any GI bleed ongoing, I have discussed the case with hematologist who has agreed to see patient and also recommended for vascular surgery consultation. Denies nausea vomiting abdominal pain Remains on heparin drip given bilateral PE    Brief hospital course:  LEONELL VOLZ is a 89 y.o. male with medical history significant for dementia, hypertension, hyperlipidemia, hypothyroid, history of TIA, history of prostate cancer, hospitalized from 2/23 to 2/27 with symptomatic anemia, hemoglobin 6.0, after presenting with a fall who presents to the ED following a near syncopal event while walking down his driveway.  ED course and data review: Tachypneic to the mid 20s in the ED with otherwise normal vitals and not hypoxic.   CTA chest shows bilateral PE as follows: Patient started on a heparin infusion.  Hospitalist consulted for admission.      Bilateral pulmonary embolism (HCC) with possible right heart strain Possibly provoked.  Patient was recently hospitalized and has a history of prostate cancer Currently not hypoxic or tachycardic Continue heparin infusion echoCardiogram report was reviewed which did not show any right heart strain Given the findings of AVM on recent colonoscopy even though patient does not have any GI bleed ongoing, I have discussed the case with hematologist who has agreed to see patient and also recommended for vascular surgery consultation.   Chronic anemia History of  AVMs on colonoscopy 2023, normal EGD 10/2022 History of right renal hematoma History  of severe anemia, hemoglobin 6.0, 10/2022 s/p transfusion Appears stable over the past month 8.7> 9.0 Recently hospitalized February 2024 for hemoglobin of 6.0 with unremarkable EGD -Monitor hemoglobin as patient is on systemic anticoagulation    Iron deficiency as well as vitamin B12 deficiency-continue with supplementation  Postural dizziness with presyncope Secondary to syncope Neurologic checks and fall precautions   Chronic kidney disease, stage 3a ([facility]) Renal function stable   Dementia (HCC) Continue donepezil and memantine Delirium precautions   Hypothyroidism Continue levothyroxine   Severe obstructive sleep apnea Continue CPAP   H/O malignant neoplasm of prostate No acute issues suspected, outpatient follow-up   Essential hypertension Continue home antihypertensives    DVT prophylaxis: Heparin infusion   Consults: none   Advance Care Planning:   Code Status: Prior    Family Communication: none at bedside   Disposition Plan: Back to previous home environment       Physical Exam Vitals and nursing note reviewed.  Constitutional:      General: He is not in acute distress. HENT:     Head: Normocephalic and atraumatic.  Cardiovascular:     Rate and Rhythm: Normal rate and regular rhythm.     Heart sounds: Normal heart sounds.  Pulmonary:     Effort: Pulmonary effort is normal.     Breath sounds: Normal breath sounds.  Abdominal:     Palpations: Abdomen is soft.     Tenderness: There is no abdominal tenderness.  Neurological:     Mental Status: Mental status is at baseline.     Vitals:   12/08/22 0824 12/08/22 1004 12/08/22 1156 12/08/22 1522  BP: (!) 162/45 (!) 166/52 (!) 171/51 (!) 159/45  Pulse: 65 76 82 86  Resp:   (!) 22 18  Temp:   (!) 97.5 F (36.4 C) 97.6 F (36.4 C)  TempSrc:      SpO2:   100% 97%  Weight:      Height:        I spent a total of 40 minutes discussing goals of care with patient's wife as well as patient's daughter  was called over the phone as well as when she came to bedside as well as discussing the case with vascular surgery team and hematologist.  Author: Verline Lema, MD 12/08/2022 4:35 PM  For on call review [email or web address].

## 2022-12-09 ENCOUNTER — Other Ambulatory Visit (HOSPITAL_COMMUNITY): Payer: Self-pay

## 2022-12-09 ENCOUNTER — Encounter: Payer: Self-pay | Admitting: Family Medicine

## 2022-12-09 ENCOUNTER — Inpatient Hospital Stay: Payer: Medicare Other | Admitting: Family Medicine

## 2022-12-09 DIAGNOSIS — I2699 Other pulmonary embolism without acute cor pulmonale: Secondary | ICD-10-CM | POA: Diagnosis not present

## 2022-12-09 LAB — CBC WITH DIFFERENTIAL/PLATELET
Abs Immature Granulocytes: 0.03 10*3/uL (ref 0.00–0.07)
Basophils Absolute: 0.1 10*3/uL (ref 0.0–0.1)
Basophils Relative: 1 %
Eosinophils Absolute: 0.2 10*3/uL (ref 0.0–0.5)
Eosinophils Relative: 2 %
HCT: 31.6 % — ABNORMAL LOW (ref 39.0–52.0)
Hemoglobin: 9.6 g/dL — ABNORMAL LOW (ref 13.0–17.0)
Immature Granulocytes: 0 %
Lymphocytes Relative: 20 %
Lymphs Abs: 1.7 10*3/uL (ref 0.7–4.0)
MCH: 24.2 pg — ABNORMAL LOW (ref 26.0–34.0)
MCHC: 30.4 g/dL (ref 30.0–36.0)
MCV: 79.8 fL — ABNORMAL LOW (ref 80.0–100.0)
Monocytes Absolute: 1 10*3/uL (ref 0.1–1.0)
Monocytes Relative: 12 %
Neutro Abs: 5.5 10*3/uL (ref 1.7–7.7)
Neutrophils Relative %: 65 %
Platelets: 316 10*3/uL (ref 150–400)
RBC: 3.96 MIL/uL — ABNORMAL LOW (ref 4.22–5.81)
RDW: 17.8 % — ABNORMAL HIGH (ref 11.5–15.5)
WBC: 8.5 10*3/uL (ref 4.0–10.5)
nRBC: 0 % (ref 0.0–0.2)

## 2022-12-09 LAB — BASIC METABOLIC PANEL
Anion gap: 11 (ref 5–15)
BUN: 32 mg/dL — ABNORMAL HIGH (ref 8–23)
CO2: 20 mmol/L — ABNORMAL LOW (ref 22–32)
Calcium: 8.8 mg/dL — ABNORMAL LOW (ref 8.9–10.3)
Chloride: 108 mmol/L (ref 98–111)
Creatinine, Ser: 1.5 mg/dL — ABNORMAL HIGH (ref 0.61–1.24)
GFR, Estimated: 43 mL/min — ABNORMAL LOW (ref >60)
Glucose, Bld: 119 mg/dL — ABNORMAL HIGH (ref 70–99)
Potassium: 4 mmol/L (ref 3.5–5.1)
Sodium: 139 mmol/L (ref 135–145)

## 2022-12-09 LAB — HEPARIN LEVEL (UNFRACTIONATED): Heparin Unfractionated: 0.47 IU/mL (ref 0.30–0.70)

## 2022-12-09 MED ORDER — ACETAMINOPHEN 325 MG PO TABS: 650.0000 mg | ORAL_TABLET | Freq: Four times a day (QID) | ORAL | 0 refills | Status: DC | PRN

## 2022-12-09 MED ORDER — APIXABAN 5 MG PO TABS: 10.0000 mg | ORAL_TABLET | Freq: Two times a day (BID) | ORAL | 0 refills | Status: DC

## 2022-12-09 MED ORDER — GUAIFENESIN ER 600 MG PO TB12: 1200.0000 mg | ORAL_TABLET | Freq: Two times a day (BID) | ORAL | 0 refills | Status: AC

## 2022-12-09 MED ORDER — FERROUS SULFATE 325 (65 FE) MG PO TABS: 325.0000 mg | ORAL_TABLET | Freq: Two times a day (BID) | ORAL | 3 refills | Status: DC

## 2022-12-09 MED ORDER — APIXABAN 5 MG PO TABS: 5.0000 mg | ORAL_TABLET | Freq: Two times a day (BID) | ORAL | 6 refills | Status: DC

## 2022-12-09 NOTE — TOC Benefit Eligibility Note (Signed)
Patient Teacher, English as a foreign language completed.    The patient is currently admitted and upon discharge could be taking Eliquis 5 mg.  The current 30 day co-pay is $514.81 due to a $501.52 deductible remaining.   The patient is insured through [employer], [place] Patient Advocate Specialist [place] Patient Advocate Team Direct Number: [phone]  Fax: [phone]

## 2022-12-09 NOTE — Plan of Care (Signed)

## 2022-12-09 NOTE — Progress Notes (Signed)
ANTICOAGULATION CONSULT NOTE  Pharmacy Consult for Heparin Infusion Indication: pulmonary embolus  Allergies  Allergen Reactions   Levofloxacin Swelling    lips swelling   Povidone Iodine Other (See Comments)    Unknown, can not remember   Sulfa Antibiotics     Unknown, can not remember   Benadryl [Diphenhydramine] Rash    Patient Measurements: Height: 5\' 7"  (170.2 cm) Weight: 68 kg (150 lb) IBW/kg (Calculated) : 66.1 Heparin Dosing Weight: 68 kg   Vital Signs: Temp: 98.3 F (36.8 C) (03/19 0330) Temp Source: Oral (03/18 2050) BP: 134/44 (03/19 0330) Pulse Rate: 83 (03/19 0330)  Labs: Recent Labs    12/06/22 1752 12/06/22 2216 12/06/22 2301 12/07/22 0424 12/07/22 1220 12/08/22 0453 12/08/22 1412 12/08/22 2220 12/09/22 0454  HGB 9.0*  --   --  8.4*  --  9.1*  --   --  9.6*  HCT 30.1*  --   --  27.4*  --  30.1*  --   --  31.6*  PLT 341  --   --  247  --  263  --   --  316  APTT  --   --  28  --   --   --   --   --   --   LABPROT  --   --  15.1  --   --   --   --   --   --   INR  --   --  1.2  --   --   --   --   --   --   HEPARINUNFRC  --   --   --  0.66   < > 0.91* 0.67 0.51 0.47  CREATININE 1.53*  --   --   --   --   --   --   --  1.50*  TROPONINIHS 8 9  --   --   --   --   --   --   --    < > = values in this interval not displayed.     Estimated Creatinine Clearance: 28.8 mL/min (A) (by C-G formula based on SCr of 1.5 mg/dL (H)).   Medical History: Past Medical History:  Diagnosis Date   Anemia    Aortic atherosclerosis (HCC)    Bell palsy    Bilateral renal cysts    Bladder tumor    CAD (coronary artery disease)    DOE (dyspnea on exertion)    Elevated lactic acid level 11/13/2022   GERD (gastroesophageal reflux disease)    History of angina    History of penile implant    History of prostate cancer    Hyperlipidemia    Hypertension    Hypothyroidism    Lipoma    MRSA bacteremia 02/2011   Nephrolithiasis    OSA on CPAP    Pneumonia     as a teenager    Medications:  Medications Prior to Admission  Medication Sig Dispense Refill Last Dose   Apoaequorin (PREVAGEN) 10 MG CAPS Take 10 mg by mouth daily.   12/06/2022   ARTIFICIAL TEAR SOLUTION OP Place 1 drop into both eyes 2 (two) times daily as needed (dry eyes).   unk   aspirin 81 MG tablet Take 81 mg by mouth daily.    12/06/2022   brimonidine (ALPHAGAN) 0.2 % ophthalmic solution Place 1 drop into the left eye 2 (two) times daily.   12/06/2022   Calcium Carbonate-Vit D-Min (CALCIUM  1200 PO) Take by mouth.   12/06/2022   cyanocobalamin (VITAMIN B12) 1000 MCG tablet Take 1 tablet (1,000 mcg total) by mouth daily. 30 tablet 2 12/06/2022   donepezil (ARICEPT) 10 MG tablet TAKE 1 TABLET BY MOUTH AT BEDTIME 90 tablet 4 12/06/2022   fluticasone (FLONASE) 50 MCG/ACT nasal spray Place 2 sprays into both nostrils daily as needed for allergies. 16 g 5 unk   isosorbide mononitrate (IMDUR) 60 MG 24 hr tablet TAKE ONE TABLET EVERY DAY 90 tablet 3 12/06/2022   ketoconazole (NIZORAL) 2 % cream APPLY TO GLANS OF PENIS DAILY 30 g 0 unk   levothyroxine (SYNTHROID) 88 MCG tablet TAKE 1 TABLET EVERY DAY ON EMPTY STOMACHWITH A GLASS OF WATER AT LEAST 30-60 MINBEFORE BREAKFAST 90 tablet 0 12/06/2022   memantine (NAMENDA) 10 MG tablet TAKE 1 TABLET BY MOUTH TWO TIMES DAILY 180 tablet 4 12/06/2022   metoprolol succinate (TOPROL-XL) 25 MG 24 hr tablet Take 12.5 mg by mouth daily.   12/06/2022 at 2100   MULTIPLE VITAMIN PO Take 1 tablet by mouth daily.   12/06/2022   pravastatin (PRAVACHOL) 40 MG tablet TAKE 1 TABLET BY MOUTH DAILY 90 tablet 4 12/06/2022   NON FORMULARY CPAP (Free Text) - Historical Medication  As directed  Started 22-Sep-1994 Active      PREVIDENT 5000 DRY MOUTH 1.1 % GEL dental gel Place 1 application onto teeth 2 (two) times daily.       Assessment: Roger Cook is a 89 y.o. male presenting after syncopal event. PMH significant for dementia, HTN, HLD, hypothyroidism, history of TIA, history  of prostate cancer. Patient was not on [facility] P[facility] per chart review. Patient was hospitalized 2/23-2/27 for symptomatic anemia (Hgb 6.0). CTA in the ED revealed bilateral PE with CT evidence of R heart strain (RV/LV 1.03). Pharmacy has been consulted to initiate and manage heparin infusion.   Baseline Labs: aPTT 28, PT 15.1, INR 1.2, Hgb 9.0, Hct 30.1, Plt 341   Goal of Therapy:  Heparin level 0.3-0.7 units/ml Monitor platelets by anticoagulation protocol: Yes  Date Time HL Rate/Comment  3/17 0424 0.66 1100/therapeutic x1 3/17 1220 0.66 1100/therapeutic x2 3/18     0453   0.91      1100 units/hr , elevated  3/18 1412 0.67 1000/therapeutic  3/18  2220 0.51 Therapeutic x 2 3/19 0454 0.47 Therapeutic x 3  Plan:  - Will continue heparin drip rate at 1000 units/hr.  - Recheck HL daily w/ AM labs while therapeutic - CBC daily while on heparin  Renda Rolls, PharmD, [facility] 12/09/2022 5:52 AM

## 2022-12-09 NOTE — Consult Note (Signed)
Roger Cook  [phone] [phone]) [phone]  ID: Roger Cook OB: 14-May-1927  MR#: [id number]  [id number]  Patient Care Team: [name], MD as PCP - General (Family Medicine) Birder Robson, MD as Referring Physician (Ophthalmology) Abbie Sons, MD (Urology) Dasher, Rayvon Char, MD (Dermatology)  CHIEF COMPLAINT: Bilateral pulmonary embolism with right heart strain.  INTERVAL HISTORY: Patient is a 89 year old male who recently presented to the emergency room with a near syncopal episode.  Subsequent workup did not reveal DVT, but bilateral pulmonary embolism with heart strain.  Patient was admitted to the hospital 2 weeks ago with GI bleed.  He currently feels well and is back to his baseline.  He has no neurologic complaints.  He denies any recent fevers.  He has a good appetite and denies weight loss.  He has no chest pain, shortness of breath, cough, or hemoptysis.  He denies any nausea, vomiting, constipation, or diarrhea.  He has no melena or hematochezia.  He has no urinary complaints.  Patient offers no specific complaints today.   REVIEW OF SYSTEMS:   Review of Systems  Constitutional: Negative.  Negative for fever, malaise/fatigue and weight loss.  Respiratory: Negative.  Negative for cough, hemoptysis and shortness of breath.   Cardiovascular: Negative.  Negative for chest pain and leg swelling.  Gastrointestinal: Negative.  Negative for abdominal pain, blood in stool and melena.  Genitourinary: Negative.  Negative for dysuria and hematuria.  Musculoskeletal: Negative.  Negative for back pain.  Skin: Negative.  Negative for rash.  Neurological: Negative.  Negative for dizziness, focal weakness, weakness and headaches.  Psychiatric/Behavioral: Negative.  The patient is not nervous/anxious.     As per HPI. Otherwise, a complete review of systems is negative.  PAST MEDICAL HISTORY: Past Medical History:  Diagnosis Date    Anemia    Aortic atherosclerosis (HCC)    Bell palsy    Bilateral renal cysts    Bladder tumor    CAD (coronary artery disease)    DOE (dyspnea on exertion)    Elevated lactic acid level 11/13/2022   GERD (gastroesophageal reflux disease)    History of angina    History of penile implant    History of prostate cancer    Hyperlipidemia    Hypertension    Hypothyroidism    Lipoma    MRSA bacteremia 02/2011   Nephrolithiasis    OSA on CPAP    Pneumonia    as a teenager    PAST SURGICAL HISTORY: Past Surgical History:  Procedure Laterality Date   APPENDECTOMY  2005   CARDIAC CATHETERIZATION N/A 08/13/1990   75% pLCx, 75% mLCx, 25% mLAD, 75% D2, 50% right renal artery; Location: [facility]; Surgeon: Doristine Bosworth, MD   CATARACT EXTRACTION  2005   also had a macular hole in 2005   CATARACT EXTRACTION  2008   COLONOSCOPY     2003, 2014   COLONOSCOPY WITH PROPOFOL N/A 12/26/2021   Procedure: COLONOSCOPY WITH PROPOFOL;  Surgeon: Lucilla Lame, MD;  Location: [facility] ENDOSCOPY;  Service: Endoscopy;  Laterality: N/A;   ESOPHAGOGASTRODUODENOSCOPY     2004, 2014   ESOPHAGOGASTRODUODENOSCOPY (EGD) WITH PROPOFOL N/A 12/26/2021   Procedure: ESOPHAGOGASTRODUODENOSCOPY (EGD) WITH PROPOFOL;  Surgeon: Lucilla Lame, MD;  Location: [facility] ENDOSCOPY;  Service: Endoscopy;  Laterality: N/A;   ESOPHAGOGASTRODUODENOSCOPY (EGD) WITH PROPOFOL N/A 11/15/2022   Procedure: ESOPHAGOGASTRODUODENOSCOPY (EGD) WITH PROPOFOL;  Surgeon: Lesly Rubenstein, MD;  Location: [facility] ENDOSCOPY;  Service: Endoscopy;  Laterality: N/A;  HAND SURGERY Left 06/26/2011   Malignancy removed from left hand   [place]   penile inplant  1984   polyp of rectum  2003   [place]   Abdominal, had to have radiation treatment with the procedure   PROSTATE SURGERY     TONSILLECTOMY     TRANSURETHRAL RESECTION OF BLADDER TUMOR N/A 03/12/2021   Procedure: TRANSURETHRAL RESECTION OF  BLADDER TUMOR (TURBT);  Surgeon: Abbie Sons, MD;  Location: [facility] ORS;  Service: Urology;  Laterality: N/A;    FAMILY HISTORY: Family History  Problem Relation Age of Onset   Lung cancer Mother    Heart disease Father     ADVANCED DIRECTIVES (Y/N):  @ADVDIR @  HEALTH MAINTENANCE: Social History   Tobacco Use   Smoking status: Former    Packs/day: 1.00    Years: 16.00    Additional pack years: 0.00    Total pack years: 16.00    Types: Cigarettes    Quit date: 1964    Years since quitting: 60.2   Smokeless tobacco: Never  Vaping Use   Vaping Use: Never used  Substance Use Topics   Alcohol use: Not Currently    Alcohol/week: 2.0 standard drinks of alcohol    Types: 2 Glasses of wine per week   Drug use: No     Colonoscopy:  PAP:  Bone density:  Lipid panel:  Allergies  Allergen Reactions   Levofloxacin Swelling    lips swelling   Povidone Iodine Other (See Comments)    Unknown, can not remember   Sulfa Antibiotics     Unknown, can not remember   Benadryl [Diphenhydramine] Rash    Current Facility-Administered Medications  Medication Dose Route Frequency Provider Last Rate Last Admin   acetaminophen (TYLENOL) tablet 650 mg  650 mg Oral Q6H PRN Athena Masse, MD       Or   acetaminophen (TYLENOL) suppository 650 mg  650 mg Rectal Q6H PRN Athena Masse, MD       amLODipine (NORVASC) tablet 10 mg  10 mg Oral Daily Marguerita Merles T, MD   10 mg at 12/09/22 0944   brimonidine (ALPHAGAN) 0.2 % ophthalmic solution 1 drop  1 drop Left Eye BID Athena Masse, MD   1 drop at 12/09/22 0945   cyanocobalamin (VITAMIN B12) tablet 1,000 mcg  1,000 mcg Oral Daily Marguerita Merles T, MD   1,000 mcg at 12/09/22 0943   donepezil (ARICEPT) tablet 10 mg  10 mg Oral QHS Judd Gaudier V, MD   10 mg at 12/08/22 2115   ferrous sulfate tablet 325 mg  325 mg Oral BID WC Marguerita Merles T, MD   325 mg at 12/09/22 0943   guaiFENesin (MUCINEX) 12 hr tablet 1,200 mg  1,200 mg Oral BID Marguerita Merles T, MD   1,200 mg at 12/09/22 0942   heparin ADULT infusion 100 units/mL (25000 units/226mL)  1,000 Units/hr Intravenous Continuous Marguerita Merles T, MD 10 mL/hr at 12/09/22 0700 1,000 Units/hr at 12/09/22 0700   hydrALAZINE (APRESOLINE) injection 10 mg  10 mg Intravenous Q4H PRN Marguerita Merles T, MD   10 mg at 12/08/22 0815   hydrALAZINE (APRESOLINE) tablet 50 mg  50 mg Oral Q8H Verline Lema, MD   50 mg at 12/09/22 0626   levothyroxine (SYNTHROID) tablet 88 mcg  88 mcg Oral Q0600 Athena Masse, MD   88 mcg at  12/09/22 0626   memantine (NAMENDA) tablet 10 mg  10 mg Oral BID Athena Masse, MD   10 mg at 12/09/22 0942   metoprolol succinate (TOPROL-XL) 24 hr tablet 12.5 mg  12.5 mg Oral Daily Judd Gaudier V, MD   12.5 mg at 12/09/22 0942   ondansetron (ZOFRAN) tablet 4 mg  4 mg Oral Q6H PRN Athena Masse, MD       Or   ondansetron [facility]) injection 4 mg  4 mg Intravenous Q6H PRN Athena Masse, MD       pravastatin (PRAVACHOL) tablet 40 mg  40 mg Oral Daily Judd Gaudier V, MD   40 mg at 12/09/22 0943    OBJECTIVE: Vitals:   12/09/22 0819 12/09/22 1205  BP: (!) 122/47 (!) 109/41  Pulse: 80 70  Resp:  14  Temp:  97.8 F (36.6 C)  SpO2:  98%     Body mass index is 23.49 kg/m.    ECOG FS:0 - Asymptomatic  General: Well-developed, well-nourished, no acute distress. Eyes: Pink conjunctiva, anicteric sclera. HEENT: Normocephalic, moist mucous membranes. Lungs: No audible wheezing or coughing. Heart: Regular rate and rhythm. Abdomen: Soft, nontender, no obvious distention. Musculoskeletal: No edema, cyanosis, or clubbing. Neuro: Alert, answering all questions appropriately. Cranial nerves grossly intact. Skin: No rashes or petechiae noted. Psych: Normal affect. Lymphatics: No cervical, calvicular, axillary or inguinal LAD.   LAB RESULTS:  Lab Results  Component Value Date   NA 139 12/09/2022   K 4.0 12/09/2022   CL 108 12/09/2022   CO2 20 (L) 12/09/2022   GLUCOSE 119  (H) 12/09/2022   BUN 32 (H) 12/09/2022   CREATININE 1.50 (H) 12/09/2022   CALCIUM 8.8 (L) 12/09/2022   PROT 7.1 12/06/2022   ALBUMIN 3.4 (L) 12/06/2022   AST 29 12/06/2022   ALT 19 12/06/2022   ALKPHOS 77 12/06/2022   BILITOT 0.5 12/06/2022   GFRNONAA 43 (L) 12/09/2022   GFRAA 51 (L) 06/05/2020    Lab Results  Component Value Date   WBC 8.5 12/09/2022   NEUTROABS 5.5 12/09/2022   HGB 9.6 (L) 12/09/2022   HCT 31.6 (L) 12/09/2022   MCV 79.8 (L) 12/09/2022   PLT 316 12/09/2022     STUDIES: US Venous Img Lower Bilateral (DVT)  Result Date: 12/08/2022 CLINICAL DATA:  [id number] Pulmonary embolism without acute cor pulmonale (HCC) [id number] EXAM: BILATERAL LOWER EXTREMITY VENOUS DOPPLER ULTRASOUND TECHNIQUE: Gray-scale sonography with compression, as well as color and duplex ultrasound, were performed to evaluate the deep venous system(s) from the level of the common femoral vein through the popliteal and proximal calf veins. COMPARISON:  None Available. FINDINGS: VENOUS Normal compressibility of the common femoral, superficial femoral, and popliteal veins, as well as the visualized calf veins. Visualized portions of profunda femoral vein and great saphenous vein unremarkable. No filling defects to suggest DVT on grayscale or color Doppler imaging. Doppler waveforms show normal direction of venous flow, normal respiratory plasticity and response to augmentation. OTHER None. Limitations: none IMPRESSION: Negative. Electronically Signed   By: Fidela Salisbury M.D.   On: 12/08/2022 20:57   ECHOCARDIOGRAM COMPLETE  Result Date: 12/08/2022    ECHOCARDIOGRAM REPORT   Patient Name:   Roger Cook Date of Exam: 12/08/2022 Medical Rec #:  [id number]      Height:       67.0 in Accession #:    [id number]     Weight:       150.0 lb Date of Birth:  1927-04-04      BSA:          1.790 m Patient Age:    41 years       BP:           162/45 mmHg Patient Gender: M              HR:           72 bpm. Exam Location:   [facility] Procedure: 2D Echo, Cardiac Doppler and Color Doppler Indications:     Pulmonary Embolus  History:         Patient has no prior history of Echocardiogram examinations.                  Angina, Signs/Symptoms:Syncope; Risk Factors:Hypertension and                  Sleep Apnea.  Sonographer:     Wenda Low Referring Phys:  [id number] PRINCE T Rande Brunt Diagnosing Phys: Kathlyn Sacramento MD IMPRESSIONS  1. Left ventricular ejection fraction, by estimation, is 60 to 65%. The left ventricle has normal function. The left ventricle has no regional wall motion abnormalities. There is mild left ventricular hypertrophy. Left ventricular diastolic parameters are consistent with Grade I diastolic dysfunction (impaired relaxation).  2. Right ventricular systolic function is normal. The right ventricular size is normal. Tricuspid regurgitation signal is inadequate for assessing PA pressure.  3. The mitral valve is normal in structure. No evidence of mitral valve regurgitation. No evidence of mitral stenosis.  4. The aortic valve is normal in structure. Aortic valve regurgitation is trivial. Aortic valve sclerosis/calcification is present, without any evidence of aortic stenosis.  5. The inferior vena cava is normal in size with greater than 50% respiratory variability, suggesting right atrial pressure of 3 mmHg. FINDINGS  Left Ventricle: Left ventricular ejection fraction, by estimation, is 60 to 65%. The left ventricle has normal function. The left ventricle has no regional wall motion abnormalities. The left ventricular internal cavity size was normal in size. There is  mild left ventricular hypertrophy. Left ventricular diastolic parameters are consistent with Grade I diastolic dysfunction (impaired relaxation). Right Ventricle: The right ventricular size is normal. No increase in right ventricular wall thickness. Right ventricular systolic function is normal. Tricuspid regurgitation signal is inadequate for assessing PA  pressure. Left Atrium: Left atrial size was normal in size. Right Atrium: Right atrial size was normal in size. Pericardium: There is no evidence of pericardial effusion. Mitral Valve: The mitral valve is normal in structure. No evidence of mitral valve regurgitation. No evidence of mitral valve stenosis. MV peak gradient, 5.6 mmHg. The mean mitral valve gradient is 2.0 mmHg. Tricuspid Valve: The tricuspid valve is normal in structure. Tricuspid valve regurgitation is not demonstrated. No evidence of tricuspid stenosis. Aortic Valve: The aortic valve is normal in structure. Aortic valve regurgitation is trivial. Aortic valve sclerosis/calcification is present, without any evidence of aortic stenosis. Aortic valve mean gradient measures 4.0 mmHg. Aortic valve peak gradient measures 8.2 mmHg. Aortic valve area, by VTI measures 2.24 cm. Pulmonic Valve: The pulmonic valve was normal in structure. Pulmonic valve regurgitation is not visualized. No evidence of pulmonic stenosis. Aorta: The aortic root is normal in size and structure. Venous: The inferior vena cava is normal in size with greater than 50% respiratory variability, suggesting right atrial pressure of 3 mmHg. IAS/Shunts: No atrial level shunt detected by color flow Doppler.  LEFT VENTRICLE PLAX 2D LVIDd:  4.60 cm   Diastology LVIDs:         3.00 cm   LV e' medial:    5.22 cm/s LV PW:         0.90 cm   LV E/e' medial:  10.0 LV IVS:        1.20 cm   LV e' lateral:   9.57 cm/s LVOT diam:     2.00 cm   LV E/e' lateral: 5.5 LV SV:         74 LV SV Index:   42 LVOT Area:     3.14 cm  RIGHT VENTRICLE RV Basal diam:  3.05 cm RV Mid diam:    2.70 cm RV S prime:     17.25 cm/s TAPSE (M-mode): 2.6 cm LEFT ATRIUM             Index        RIGHT ATRIUM           Index LA diam:        3.80 cm 2.12 cm/m   RA Area:     10.90 cm LA Vol (A2C):   53.8 ml 30.06 ml/m  RA Volume:   21.30 ml  11.90 ml/m LA Vol (A4C):   30.6 ml 17.10 ml/m LA Biplane Vol: 41.3 ml 23.08  ml/m  AORTIC VALVE                    PULMONIC VALVE AV Area (Vmax):    2.07 cm     PV Vmax:       0.95 m/s AV Area (Vmean):   2.01 cm     PV Peak grad:  3.6 mmHg AV Area (VTI):     2.24 cm AV Vmax:           143.00 cm/s AV Vmean:          95.700 cm/s AV VTI:            0.332 m AV Peak Grad:      8.2 mmHg AV Mean Grad:      4.0 mmHg LVOT Vmax:         94.20 cm/s LVOT Vmean:        61.300 cm/s LVOT VTI:          0.237 m LVOT/AV VTI ratio: 0.71  AORTA Ao Root diam: 3.20 cm MITRAL VALVE MV Area (PHT): 2.69 cm     SHUNTS MV Area VTI:   2.68 cm     Systemic VTI:  0.24 m MV Peak grad:  5.6 mmHg     Systemic Diam: 2.00 cm MV Mean grad:  2.0 mmHg MV Vmax:       1.18 m/s MV Vmean:      54.4 cm/s MV Decel Time: 282 msec MV E velocity: 52.40 cm/s MV A velocity: 114.00 cm/s MV E/A ratio:  0.46 Kathlyn Sacramento MD Electronically signed by Kathlyn Sacramento MD Signature Date/Time: 12/08/2022/11:54:39 AM    Final    CT Angio Chest PE W and/or Wo Contrast  Result Date: 12/06/2022 CLINICAL DATA:  Near syncope. EXAM: CT ANGIOGRAPHY CHEST WITH CONTRAST TECHNIQUE: Multidetector CT imaging of the chest was performed using the standard protocol during bolus administration of intravenous contrast. Multiplanar CT image reconstructions and MIPs were obtained to evaluate the vascular anatomy. RADIATION DOSE REDUCTION: This exam was performed according to the departmental dose-optimization program which includes automated exposure control, adjustment of the mA and/or kV according to patient size and/or  use of iterative reconstruction technique. CONTRAST:  70mL OMNIPAQUE IOHEXOL 350 MG/ML SOLN COMPARISON:  None Available. FINDINGS: Cardiovascular: There is marked severity calcification of the aortic arch and descending thoracic aorta, without evidence of aortic aneurysm. Satisfactory opacification of the pulmonary arteries to the segmental level. Marked severity areas of intraluminal low attenuation are seen involving multiple bilateral  upper lobe, right middle lobe and bilateral lower lobe branches of the bilateral pulmonary arteries. No saddle embolus is identified. Normal heart size with marked severity coronary artery calcification and right heart strain (RV/LV ratio of 1.03). No pericardial effusion. Mediastinum/Nodes: No enlarged mediastinal, hilar, or axillary lymph nodes. The thyroid gland is not clearly identified. The trachea and esophagus demonstrate no significant findings. Lungs/Pleura: Mild atelectatic changes are seen within the posterior aspects of the bilateral lower lobes. Focal linear scarring and/or atelectasis is noted within the posterolateral aspect of the right lung base. There is no evidence of an acute infiltrate, pleural effusion or pneumothorax. Upper Abdomen: A 2.2 cm diameter simple cyst is seen along the anterior aspect of the upper pole of the right kidney. Musculoskeletal: A chronic compression fracture deformity is seen involving the T4 vertebral body. Review of the MIP images confirms the above findings. IMPRESSION: 1. Marked severity bilateral pulmonary embolism with CT evidence of right heart strain (RV/LV ratio of 1.03). 2. Mild bilateral lower lobe atelectasis. 3. Chronic compression fracture deformity of the T4 vertebral body. 4. Aortic atherosclerosis. Aortic Atherosclerosis (ICD10-I70.0). Electronically Signed   By: Virgina Norfolk M.D.   On: 12/06/2022 22:16   CT HEAD WO CONTRAST (5MM)  Result Date: 12/06/2022 CLINICAL DATA:  Syncope/presyncope, cerebrovascular cause suspected. Dizziness EXAM: CT HEAD WITHOUT CONTRAST TECHNIQUE: Contiguous axial images were obtained from the base of the skull through the vertex without intravenous contrast. RADIATION DOSE REDUCTION: This exam was performed according to the departmental dose-optimization program which includes automated exposure control, adjustment of the mA and/or kV according to patient size and/or use of iterative reconstruction technique.  COMPARISON:  11/13/2022 FINDINGS: Brain: Normal anatomic configuration. Moderate diffuse parenchymal volume loss is commensurate with the patient's age. Stable moderate periventricular white matter changes are present likely reflecting the sequela of small vessel ischemia. Stable remote lacunar infarct within the left basal ganglia. No abnormal intra or extra-axial mass lesion or fluid collection. No abnormal mass effect or midline shift. No evidence of acute intracranial hemorrhage or infarct. Ventricular size is commensurate with the degree of parenchymal volume loss. Cerebellum unremarkable. Vascular: No asymmetric hyperdense vasculature at the skull base. Skull: Intact Sinuses/Orbits: There is complete opacification of the a frontal sinuses bilaterally, unchanged from prior examination, in keeping with changes of chronic sinusitis. Surgical changes of bilateral ethmoidectomy, maxillary antrectomy, and middle turbinectomy are identified. Moderate mucosal thickening within the left maxillary sinus without air-fluid level. The orbits are unremarkable. Other: Mastoid air cells and middle ear cavities are clear. IMPRESSION: 1. No acute intracranial abnormality. No calvarial fracture. 2. Stable senescent changes and remote lacunar infarct within the left basal ganglia. 3. Stable paranasal sinus disease. Electronically Signed   By: Fidela Salisbury M.D.   On: 12/06/2022 19:02   DG Chest 2 View  Result Date: 12/06/2022 CLINICAL DATA:  Near syncope . EXAM: CHEST - 2 VIEW COMPARISON:  11/13/2022 FINDINGS: The lungs are clear without focal pneumonia, edema, pneumothorax or pleural effusion. Interstitial markings are diffusely coarsened with chronic features. Patchy airspace disease in the suprahilar right lung and right base is similar to prior. The cardiopericardial silhouette is within  normal limits for size. Telemetry leads overlie the chest. IMPRESSION: Chronic interstitial changes with patchy airspace disease in the  suprahilar right lung and right base, potentially scarring although infiltrate not excluded. Electronically Signed   By: Misty Stanley M.D.   On: 12/06/2022 18:46   DG Knee Complete 4 Views Left  Result Date: 11/13/2022 CLINICAL DATA:  Left knee pain, fall EXAM: LEFT KNEE - COMPLETE 4+ VIEW COMPARISON:  10/13/2022 FINDINGS: No evidence of fracture, dislocation, or joint effusion. Mild medial compartment joint space narrowing. Soft tissues are unremarkable. IMPRESSION: Negative. Electronically Signed   By: Davina Poke D.O.   On: 11/13/2022 15:35   DG Chest Port 1 View  Result Date: 11/13/2022 CLINICAL DATA:  Sepsis EXAM: PORTABLE CHEST 1 VIEW COMPARISON:  03/10/2022 FINDINGS: The heart size and mediastinal contours are within normal limits. Aortic atherosclerosis. Diffusely increased interstitial markings bilaterally. No focal consolidation. No pleural effusion or pneumothorax. The visualized skeletal structures are unremarkable. IMPRESSION: Diffusely increased interstitial markings bilaterally, which may reflect bronchitic lung changes, edema, or developing atypical/viral infection. Electronically Signed   By: Davina Poke D.O.   On: 11/13/2022 15:34   CT Head Wo Contrast  Result Date: 11/13/2022 CLINICAL DATA:  Mental status change, unknown cause EXAM: CT HEAD WITHOUT CONTRAST TECHNIQUE: Contiguous axial images were obtained from the base of the skull through the vertex without intravenous contrast. RADIATION DOSE REDUCTION: This exam was performed according to the departmental dose-optimization program which includes automated exposure control, adjustment of the mA and/or kV according to patient size and/or use of iterative reconstruction technique. COMPARISON:  07/04/2022 FINDINGS: Brain: No evidence of acute infarction, hemorrhage, hydrocephalus, extra-axial collection or mass lesion/mass effect. Patchy low-density changes within the periventricular and subcortical white matter most  compatible with chronic microvascular ischemic change. Moderate diffuse cerebral volume loss. Vascular: Atherosclerotic calcifications involving the large vessels of the skull base. No unexpected hyperdense vessel. Skull: Normal. Negative for fracture or focal lesion. Sinuses/Orbits: Chronic opacification of the frontal sinuses. Prior sinus surgery. Mastoid air cells are clear. Other: None. IMPRESSION: 1. No acute intracranial findings. 2. Chronic microvascular ischemic change and cerebral volume loss. Electronically Signed   By: Davina Poke D.O.   On: 11/13/2022 15:26    ASSESSMENT: Bilateral pulmonary embolism with right heart strain.  PLAN:    Bilateral pulmonary embolism with right heart strain: CT scan results from December 06, 2022 reviewed independently and reported as above with marked severity bilateral pulmonary embolism with evidence of right heart strain.  Lower extremity Doppler did not reveal any DVT.  Patient reports his GI bleed from 2 weeks ago has resolved and he does not have a history of repeated bleeding.  Appreciate vascular surgery input who does not feel IVC filter is necessary at this time.  While there is increased risk of placing patient on anticoagulation, given the size of patient's bilateral pulmonary embolism the benefit currently outweighs the risk.  Patient has been instructed that if he has any evidence of a repeat GI bleed he needs to discontinue Eliquis immediately and proceed to the emergency room.  Patient will likely need 3 to 6 months of treatment.  Will arrange follow-up in the cancer center approximately 1 month after discharge.  Appreciate consult, will follow.  Lloyd Huger, MD   12/09/2022 12:53 PM

## 2022-12-09 NOTE — Progress Notes (Unsigned)
Reviewed and agree with patient's detailed plan of care. Home Health Certification and Plan of Care from 11/20/2022 through 01/18/2023 signed and sent to medical records to be faxed to home health agency.

## 2022-12-09 NOTE — Plan of Care (Signed)
  Problem: Education: Goal: Knowledge of General Education information will improve Description: Including pain rating scale, medication(s)/side effects and non-pharmacologic comfort measures 12/09/2022 1459 by Shauna Hugh, RN Outcome: Adequate for Discharge 12/09/2022 0749 by Shauna Hugh, RN Outcome: Progressing   Problem: Health Behavior/Discharge Planning: Goal: Ability to manage health-related needs will improve 12/09/2022 1459 by Shauna Hugh, RN Outcome: Adequate for Discharge 12/09/2022 0749 by Shauna Hugh, RN Outcome: Progressing   Problem: Clinical Measurements: Goal: Ability to maintain clinical measurements within normal limits will improve 12/09/2022 1459 by Shauna Hugh, RN Outcome: Adequate for Discharge 12/09/2022 0749 by Shauna Hugh, RN Outcome: Progressing Goal: Will remain free from infection 12/09/2022 1459 by Shauna Hugh, RN Outcome: Adequate for Discharge 12/09/2022 0749 by Shauna Hugh, RN Outcome: Progressing Goal: Diagnostic test results will improve 12/09/2022 1459 by Shauna Hugh, RN Outcome: Adequate for Discharge 12/09/2022 0749 by Shauna Hugh, RN Outcome: Progressing Goal: Respiratory complications will improve 12/09/2022 1459 by Shauna Hugh, RN Outcome: Adequate for Discharge 12/09/2022 0749 by Shauna Hugh, RN Outcome: Progressing Goal: Cardiovascular complication will be avoided 12/09/2022 1459 by Shauna Hugh, RN Outcome: Adequate for Discharge 12/09/2022 0749 by Shauna Hugh, RN Outcome: Progressing   Problem: Activity: Goal: Risk for activity intolerance will decrease 12/09/2022 1459 by Shauna Hugh, RN Outcome: Adequate for Discharge 12/09/2022 0749 by Shauna Hugh, RN Outcome: Progressing   Problem: Nutrition: Goal: Adequate nutrition will be maintained 12/09/2022 1459 by Shauna Hugh, RN Outcome: Adequate for Discharge 12/09/2022 0749 by Shauna Hugh, RN Outcome: Progressing   Problem: Coping: Goal: Level of anxiety  will decrease 12/09/2022 1459 by Shauna Hugh, RN Outcome: Adequate for Discharge 12/09/2022 0749 by Shauna Hugh, RN Outcome: Progressing   Problem: Elimination: Goal: Will not experience complications related to bowel motility 12/09/2022 1459 by Shauna Hugh, RN Outcome: Adequate for Discharge 12/09/2022 0749 by Shauna Hugh, RN Outcome: Progressing Goal: Will not experience complications related to urinary retention 12/09/2022 1459 by Shauna Hugh, RN Outcome: Adequate for Discharge 12/09/2022 0749 by Shauna Hugh, RN Outcome: Progressing   Problem: Pain Managment: Goal: General experience of comfort will improve 12/09/2022 1459 by Shauna Hugh, RN Outcome: Adequate for Discharge 12/09/2022 0749 by Shauna Hugh, RN Outcome: Progressing   Problem: Safety: Goal: Ability to remain free from injury will improve 12/09/2022 1459 by Shauna Hugh, RN Outcome: Adequate for Discharge 12/09/2022 0749 by Shauna Hugh, RN Outcome: Progressing   Problem: Skin Integrity: Goal: Risk for impaired skin integrity will decrease 12/09/2022 1459 by Shauna Hugh, RN Outcome: Adequate for Discharge 12/09/2022 0749 by Shauna Hugh, RN Outcome: Progressing

## 2022-12-09 NOTE — Discharge Summary (Signed)
Physician Discharge Summary   Patient: Roger Cook MRN: [id number] DOB: 06-02-1927  Admit date:     12/06/2022  Discharge date: 12/09/22  Discharge Physician: Verline Lema   PCP: [name], MD     Discharge Diagnoses: Bilateral pulmonary embolism [facility]) with possible right heart strain Chronic anemia History of  AVMs on colonoscopy 2023, normal EGD 10/2022 History of right renal hematoma History of severe anemia, hemoglobin 6.0, 10/2022 s/p transfusion Iron deficiency as well as vitamin B12 deficiency Postural dizziness with presyncope Chronic kidney disease, stage 3a ([place] Dementia ([place]) Hypothyroidism Severe obstructive sleep apnea H/O malignant neoplasm of prostate Essential hypertension  Hospital Course: Roger Cook is a 89 y.o. male with medical history significant for dementia, hypertension, hyperlipidemia, hypothyroid, history of TIA, history of prostate cancer, hospitalized from 2/23 to 2/27 with symptomatic anemia, hemoglobin 6.0, after presenting with a fall who presents to the ED following a near syncopal event while walking down his driveway.  ED course and data review: Tachypneic to the mid 20s in the ED with otherwise normal vitals and not hypoxic.   CTA chest shows bilateral PE. Patient started on a heparin infusion.  Given history of recent anemia requiring blood transfusion hematology was consulted who evaluated patient and given that there is no ongoing GI bleeding they requested to transition to Eliquis and to follow-up with hematologist after discharge.  Echocardiogram results showed normal ejection fraction with no right heart strain.  Patient was evaluated by vascular surgeon at the request of hematologist who also did not recommend IVC filter at this time unless patient is not able to tolerate anticoagulation.  Patient is therefore cleared for discharge today and to follow-up with primary care physician as well as hematologist.    Consultants: Surgery,  hematology Procedures performed: None Disposition: Home Diet recommendation:  Discharge Diet Orders (From admission, onward)     Start     Ordered   12/09/22 0000  Diet - low sodium heart healthy        12/09/22 1359           Cardiac diet DISCHARGE MEDICATION: Allergies as of 12/09/2022       Reactions   Levofloxacin Swelling   lips swelling   Povidone Iodine Other (See Comments)   Unknown, can not remember   Sulfa Antibiotics    Unknown, can not remember   Benadryl [diphenhydramine] Rash        Medication List     STOP taking these medications    aspirin 81 MG tablet       TAKE these medications    acetaminophen 325 MG tablet Commonly known as: TYLENOL Take 2 tablets (650 mg total) by mouth every 6 (six) hours as needed for mild pain (or Fever >/= 101).   apixaban 5 MG Tabs tablet Commonly known as: ELIQUIS Take 2 tablets (10 mg total) by mouth 2 (two) times daily for 7 days.   apixaban 5 MG Tabs tablet Commonly known as: ELIQUIS Take 1 tablet (5 mg total) by mouth 2 (two) times daily. Start taking on: December 17, 2022   ARTIFICIAL TEAR SOLUTION OP Place 1 drop into both eyes 2 (two) times daily as needed (dry eyes).   brimonidine 0.2 % ophthalmic solution Commonly known as: ALPHAGAN Place 1 drop into the left eye 2 (two) times daily.   CALCIUM 1200 PO Take by mouth.   cyanocobalamin 1000 MCG tablet Commonly known as: VITAMIN B12 Take 1 tablet (1,000 mcg total)  by mouth daily.   donepezil 10 MG tablet Commonly known as: ARICEPT TAKE 1 TABLET BY MOUTH AT BEDTIME   ferrous sulfate 325 (65 FE) MG tablet Take 1 tablet (325 mg total) by mouth 2 (two) times daily with a meal.   fluticasone 50 MCG/ACT nasal spray Commonly known as: FLONASE Place 2 sprays into both nostrils daily as needed for allergies.   guaiFENesin 600 MG 12 hr tablet Commonly known as: MUCINEX Take 2 tablets (1,200 mg total) by mouth 2 (two) times daily for 7 days.    isosorbide mononitrate 60 MG 24 hr tablet Commonly known as: IMDUR TAKE ONE TABLET EVERY DAY   ketoconazole 2 % cream Commonly known as: NIZORAL APPLY TO GLANS OF PENIS DAILY   levothyroxine 88 MCG tablet Commonly known as: SYNTHROID TAKE 1 TABLET EVERY DAY ON EMPTY STOMACHWITH A GLASS OF WATER AT LEAST 30-60 MINBEFORE BREAKFAST   memantine 10 MG tablet Commonly known as: NAMENDA TAKE 1 TABLET BY MOUTH TWO TIMES DAILY   metoprolol succinate 25 MG 24 hr tablet Commonly known as: TOPROL-XL Take 12.5 mg by mouth daily.   MULTIPLE VITAMIN PO Take 1 tablet by mouth daily.   NON FORMULARY CPAP (Free Text) - Historical Medication  As directed  Started 22-Sep-1994 Active   pravastatin 40 MG tablet Commonly known as: PRAVACHOL TAKE 1 TABLET BY MOUTH DAILY   Prevagen 10 MG Caps Generic drug: Apoaequorin Take 10 mg by mouth daily.   PreviDent 5000 Dry Mouth 1.1 % Gel dental gel Generic drug: sodium fluoride Place 1 application onto teeth 2 (two) times daily.        Follow-up Information     Lloyd Huger, MD. Schedule an appointment as soon as possible for a visit in 1 month(s).   Specialty: Oncology Why: Follow-up with hematologist in 1 month after discharge Contact information: [place] [address] [phone]                Discharge Exam: Danley Danker Weights   12/06/22 1733  Weight: 68 kg   Vitals and nursing note reviewed.  Constitutional:      General: He is not in acute distress. HENT:     Head: Normocephalic and atraumatic.  Cardiovascular:     Rate and Rhythm: Normal rate and regular rhythm.     Heart sounds: Normal heart sounds.  Pulmonary:     Effort: Pulmonary effort is normal.     Breath sounds: Normal breath sounds.  Abdominal:     Palpations: Abdomen is soft.     Tenderness: There is no abdominal tenderness.  Neurological:     Mental Status: Mental status is at baseline.  Condition at discharge:  good   Discharge time spent: greater than 30 minutes.  Signed: Verline Lema, MD [facility] 12/09/2022

## 2022-12-10 ENCOUNTER — Telehealth: Payer: Self-pay | Admitting: *Deleted

## 2022-12-10 NOTE — Transitions of Care (Post Inpatient/ED Visit) (Signed)
   12/10/2022  Name: Roger Cook MRN: [id number] DOB: February 20, 1927  Today's TOC FU Call Status: Today's TOC FU Call Status:: Successful TOC FU Call Competed TOC FU Call Complete Date: 12/10/22  Transition Care Management Follow-up Telephone Call Date of Discharge: 12/09/22 Discharge Facility: [facility]) Type of Discharge: Inpatient Admission Primary Inpatient Discharge Diagnosis:: Pulmonary embilus How have you been since you were released from the hospital?: Better Any questions or concerns?: No  Items Reviewed: Did you receive and understand the discharge instructions provided?: Yes Medications obtained and verified?: Yes (Medications Reviewed) Any new allergies since your discharge?: No Dietary orders reviewed?: No Do you have support at home?: Yes People in Home: spouse Name of Support/Comfort Primary Source: [facility] and Equipment/Supplies: [place] Ordered?: Yes Name of [place]:: [place] set up a time to come to your home?: Yes [place] Visit Date: 12/10/22 Any new equipment or medical supplies ordered?: No Do you have any questions related to the use of the equipment/supplies?: No  Functional Questionnaire: Do you need assistance with bathing/showering or dressing?: Yes Do you need assistance with meal preparation?: Yes Do you need assistance with eating?: No Do you have difficulty maintaining continence: No Do you need assistance with getting out of bed/getting out of a chair/moving?: No Do you have difficulty managing or taking your medications?: Yes  Follow up appointments reviewed: PCP Follow-up appointment confirmed?: Yes Date of PCP follow-up appointment?: 12/19/22 Follow-up Provider: Dr Lelon Huh Specialist [facility] Follow-up appointment confirmed?: Yes Date of Specialist follow-up appointment?: 01/09/23 Follow-Up Specialty Provider:: Dr Tod Persia [id number] 10:30 Do  you need transportation to your follow-up appointment?: No Do you understand care options if your condition(s) worsen?: Yes-patient verbalized understanding  SDOH Interventions Today    Flowsheet Row Most Recent Value  SDOH Interventions   Food Insecurity Interventions Intervention Not Indicated  Housing Interventions Intervention Not Indicated  Transportation Interventions Intervention Not Indicated      Interventions Today    Flowsheet Row Most Recent Value  General Interventions   General Interventions Discussed/Reviewed Doctor Visits  Doctor Visits Discussed/Reviewed Doctor Visits Reviewed, Doctor Visits Discussed  Pharmacy Interventions   Pharmacy Dicussed/Reviewed Pharmacy Topics Discussed, Pharmacy Topics Reviewed      TOC Interventions Today    Flowsheet Row Most Recent Value  TOC Interventions   TOC Interventions Discussed/Reviewed TOC Interventions Discussed, TOC Interventions Reviewed, Arranged PCP follow up less than 12 days/Care Guide scheduled       [place] [facility] [phone]

## 2022-12-11 ENCOUNTER — Telehealth: Payer: Self-pay | Admitting: Family Medicine

## 2022-12-11 DIAGNOSIS — E785 Hyperlipidemia, unspecified: Secondary | ICD-10-CM | POA: Diagnosis not present

## 2022-12-11 DIAGNOSIS — D509 Iron deficiency anemia, unspecified: Secondary | ICD-10-CM | POA: Diagnosis not present

## 2022-12-11 DIAGNOSIS — E538 Deficiency of other specified B group vitamins: Secondary | ICD-10-CM | POA: Diagnosis not present

## 2022-12-11 DIAGNOSIS — I10 Essential (primary) hypertension: Secondary | ICD-10-CM | POA: Diagnosis not present

## 2022-12-11 DIAGNOSIS — E039 Hypothyroidism, unspecified: Secondary | ICD-10-CM | POA: Diagnosis not present

## 2022-12-11 DIAGNOSIS — I700 Atherosclerosis of aorta: Secondary | ICD-10-CM | POA: Diagnosis not present

## 2022-12-11 NOTE — Telephone Encounter (Signed)
Home Health Verbal Orders - Caller/Agency: [facility] Number: [id number] Requesting OT/PT/Skilled Nursing/Social Work/Speech Therapy: PT  Frequency:   1w5

## 2022-12-12 NOTE — Telephone Encounter (Signed)
Advised 

## 2022-12-12 NOTE — Telephone Encounter (Signed)
That's fine

## 2022-12-13 DIAGNOSIS — E538 Deficiency of other specified B group vitamins: Secondary | ICD-10-CM | POA: Diagnosis not present

## 2022-12-13 DIAGNOSIS — E785 Hyperlipidemia, unspecified: Secondary | ICD-10-CM | POA: Diagnosis not present

## 2022-12-13 DIAGNOSIS — I10 Essential (primary) hypertension: Secondary | ICD-10-CM | POA: Diagnosis not present

## 2022-12-13 DIAGNOSIS — E039 Hypothyroidism, unspecified: Secondary | ICD-10-CM | POA: Diagnosis not present

## 2022-12-13 DIAGNOSIS — I700 Atherosclerosis of aorta: Secondary | ICD-10-CM | POA: Diagnosis not present

## 2022-12-13 DIAGNOSIS — D509 Iron deficiency anemia, unspecified: Secondary | ICD-10-CM | POA: Diagnosis not present

## 2022-12-15 ENCOUNTER — Inpatient Hospital Stay: Payer: Medicare Other | Admitting: Family Medicine

## 2022-12-16 ENCOUNTER — Ambulatory Visit (INDEPENDENT_AMBULATORY_CARE_PROVIDER_SITE_OTHER): Payer: Medicare Other | Admitting: Family Medicine

## 2022-12-16 ENCOUNTER — Telehealth: Payer: Self-pay | Admitting: Family Medicine

## 2022-12-16 DIAGNOSIS — D509 Iron deficiency anemia, unspecified: Secondary | ICD-10-CM

## 2022-12-16 DIAGNOSIS — E785 Hyperlipidemia, unspecified: Secondary | ICD-10-CM | POA: Diagnosis not present

## 2022-12-16 DIAGNOSIS — E039 Hypothyroidism, unspecified: Secondary | ICD-10-CM | POA: Diagnosis not present

## 2022-12-16 DIAGNOSIS — I10 Essential (primary) hypertension: Secondary | ICD-10-CM | POA: Diagnosis not present

## 2022-12-16 DIAGNOSIS — E538 Deficiency of other specified B group vitamins: Secondary | ICD-10-CM | POA: Diagnosis not present

## 2022-12-16 DIAGNOSIS — F03B0 Unspecified dementia, moderate, without behavioral disturbance, psychotic disturbance, mood disturbance, and anxiety: Secondary | ICD-10-CM

## 2022-12-16 DIAGNOSIS — I700 Atherosclerosis of aorta: Secondary | ICD-10-CM | POA: Diagnosis not present

## 2022-12-16 DIAGNOSIS — I2699 Other pulmonary embolism without acute cor pulmonale: Secondary | ICD-10-CM

## 2022-12-16 DIAGNOSIS — F02818 Dementia in other diseases classified elsewhere, unspecified severity, with other behavioral disturbance: Secondary | ICD-10-CM

## 2022-12-16 DIAGNOSIS — D649 Anemia, unspecified: Secondary | ICD-10-CM

## 2022-12-16 NOTE — Telephone Encounter (Unsigned)
Copied from [place] [phone]. Topic: Quick Communication - Home Health Verbal Orders >> Dec 16, 2022  3:42 PM Cyndi Bender wrote: Caller/Agency: Lattie Haw with [place] Number: [phone] may leave voicemail Requesting OT/PT/Skilled Nursing/Social Work/Speech Therapy: OT Frequency: 1 x 6

## 2022-12-16 NOTE — Progress Notes (Signed)
Reviewed and agree with patient's detailed plan of care. Home Health Certification and Plan of Care from 11/20/2022 through 01/18/2023 signed and sent to medical records to be faxed to home health agency.

## 2022-12-17 DIAGNOSIS — I700 Atherosclerosis of aorta: Secondary | ICD-10-CM | POA: Diagnosis not present

## 2022-12-17 DIAGNOSIS — E785 Hyperlipidemia, unspecified: Secondary | ICD-10-CM | POA: Diagnosis not present

## 2022-12-17 DIAGNOSIS — D509 Iron deficiency anemia, unspecified: Secondary | ICD-10-CM | POA: Diagnosis not present

## 2022-12-17 DIAGNOSIS — E039 Hypothyroidism, unspecified: Secondary | ICD-10-CM | POA: Diagnosis not present

## 2022-12-17 DIAGNOSIS — I10 Essential (primary) hypertension: Secondary | ICD-10-CM | POA: Diagnosis not present

## 2022-12-17 DIAGNOSIS — E538 Deficiency of other specified B group vitamins: Secondary | ICD-10-CM | POA: Diagnosis not present

## 2022-12-17 NOTE — Telephone Encounter (Signed)
That fine.

## 2022-12-17 NOTE — Telephone Encounter (Signed)
LMTCB-per voicemail Roger Cook doesn't check voicemail very often. Will try later on.

## 2022-12-19 ENCOUNTER — Emergency Department: Payer: Medicare Other

## 2022-12-19 ENCOUNTER — Inpatient Hospital Stay: Payer: Medicare Other | Admitting: Family Medicine

## 2022-12-19 ENCOUNTER — Inpatient Hospital Stay
Admission: EM | Admit: 2022-12-19 | Discharge: 2022-12-21 | DRG: 312 | Disposition: A | Discharge status: Home Health | Payer: Medicare Other | Attending: Internal Medicine | Admitting: Internal Medicine

## 2022-12-19 ENCOUNTER — Other Ambulatory Visit: Payer: Self-pay

## 2022-12-19 DIAGNOSIS — N1831 Chronic kidney disease, stage 3a: Secondary | ICD-10-CM | POA: Diagnosis present

## 2022-12-19 DIAGNOSIS — Z8673 Personal history of transient ischemic attack (TIA), and cerebral infarction without residual deficits: Secondary | ICD-10-CM

## 2022-12-19 DIAGNOSIS — I2510 Atherosclerotic heart disease of native coronary artery without angina pectoris: Secondary | ICD-10-CM | POA: Diagnosis present

## 2022-12-19 DIAGNOSIS — Z8249 Family history of ischemic heart disease and other diseases of the circulatory system: Secondary | ICD-10-CM | POA: Diagnosis not present

## 2022-12-19 DIAGNOSIS — Z79899 Other long term (current) drug therapy: Secondary | ICD-10-CM

## 2022-12-19 DIAGNOSIS — D649 Anemia, unspecified: Secondary | ICD-10-CM | POA: Diagnosis present

## 2022-12-19 DIAGNOSIS — I2699 Other pulmonary embolism without acute cor pulmonale: Secondary | ICD-10-CM | POA: Diagnosis not present

## 2022-12-19 DIAGNOSIS — R55 Syncope and collapse: Secondary | ICD-10-CM | POA: Diagnosis not present

## 2022-12-19 DIAGNOSIS — Z888 Allergy status to other drugs, medicaments and biological substances status: Secondary | ICD-10-CM

## 2022-12-19 DIAGNOSIS — Z882 Allergy status to sulfonamides status: Secondary | ICD-10-CM

## 2022-12-19 DIAGNOSIS — Z87891 Personal history of nicotine dependence: Secondary | ICD-10-CM

## 2022-12-19 DIAGNOSIS — G4733 Obstructive sleep apnea (adult) (pediatric): Secondary | ICD-10-CM | POA: Diagnosis present

## 2022-12-19 DIAGNOSIS — I129 Hypertensive chronic kidney disease with stage 1 through stage 4 chronic kidney disease, or unspecified chronic kidney disease: Secondary | ICD-10-CM | POA: Diagnosis present

## 2022-12-19 DIAGNOSIS — I6381 Other cerebral infarction due to occlusion or stenosis of small artery: Secondary | ICD-10-CM | POA: Diagnosis not present

## 2022-12-19 DIAGNOSIS — Z79890 Hormone replacement therapy (postmenopausal): Secondary | ICD-10-CM | POA: Diagnosis not present

## 2022-12-19 DIAGNOSIS — R4182 Altered mental status, unspecified: Secondary | ICD-10-CM | POA: Diagnosis not present

## 2022-12-19 DIAGNOSIS — Z8546 Personal history of malignant neoplasm of prostate: Secondary | ICD-10-CM

## 2022-12-19 DIAGNOSIS — K219 Gastro-esophageal reflux disease without esophagitis: Secondary | ICD-10-CM | POA: Diagnosis present

## 2022-12-19 DIAGNOSIS — F0390 Unspecified dementia without behavioral disturbance: Secondary | ICD-10-CM | POA: Diagnosis present

## 2022-12-19 DIAGNOSIS — E785 Hyperlipidemia, unspecified: Secondary | ICD-10-CM | POA: Diagnosis not present

## 2022-12-19 DIAGNOSIS — I10 Essential (primary) hypertension: Secondary | ICD-10-CM | POA: Diagnosis present

## 2022-12-19 DIAGNOSIS — Z801 Family history of malignant neoplasm of trachea, bronchus and lung: Secondary | ICD-10-CM | POA: Diagnosis not present

## 2022-12-19 DIAGNOSIS — E039 Hypothyroidism, unspecified: Secondary | ICD-10-CM | POA: Diagnosis present

## 2022-12-19 DIAGNOSIS — J9811 Atelectasis: Secondary | ICD-10-CM | POA: Diagnosis not present

## 2022-12-19 DIAGNOSIS — Z7901 Long term (current) use of anticoagulants: Secondary | ICD-10-CM | POA: Diagnosis not present

## 2022-12-19 DIAGNOSIS — G510 Bell's palsy: Secondary | ICD-10-CM | POA: Diagnosis present

## 2022-12-19 DIAGNOSIS — Z960 Presence of urogenital implants: Secondary | ICD-10-CM | POA: Diagnosis present

## 2022-12-19 DIAGNOSIS — I959 Hypotension, unspecified: Secondary | ICD-10-CM | POA: Diagnosis not present

## 2022-12-19 DIAGNOSIS — Z91048 Other nonmedicinal substance allergy status: Secondary | ICD-10-CM

## 2022-12-19 DIAGNOSIS — D509 Iron deficiency anemia, unspecified: Secondary | ICD-10-CM | POA: Diagnosis not present

## 2022-12-19 DIAGNOSIS — Z9849 Cataract extraction status, unspecified eye: Secondary | ICD-10-CM

## 2022-12-19 DIAGNOSIS — E538 Deficiency of other specified B group vitamins: Secondary | ICD-10-CM | POA: Diagnosis not present

## 2022-12-19 DIAGNOSIS — I700 Atherosclerosis of aorta: Secondary | ICD-10-CM | POA: Diagnosis not present

## 2022-12-19 LAB — BASIC METABOLIC PANEL
Anion gap: 12 (ref 5–15)
BUN: 26 mg/dL — ABNORMAL HIGH (ref 8–23)
CO2: 20 mmol/L — ABNORMAL LOW (ref 22–32)
Calcium: 9 mg/dL (ref 8.9–10.3)
Chloride: 105 mmol/L (ref 98–111)
Creatinine, Ser: 1.56 mg/dL — ABNORMAL HIGH (ref 0.61–1.24)
GFR, Estimated: 41 mL/min — ABNORMAL LOW (ref >60)
Glucose, Bld: 134 mg/dL — ABNORMAL HIGH (ref 70–99)
Potassium: 4.4 mmol/L (ref 3.5–5.1)
Sodium: 137 mmol/L (ref 135–145)

## 2022-12-19 LAB — CBC
HCT: 27 % — ABNORMAL LOW (ref 39.0–52.0)
Hemoglobin: 8 g/dL — ABNORMAL LOW (ref 13.0–17.0)
MCH: 25.7 pg — ABNORMAL LOW (ref 26.0–34.0)
MCHC: 29.6 g/dL — ABNORMAL LOW (ref 30.0–36.0)
MCV: 86.8 fL (ref 80.0–100.0)
Platelets: 315 10*3/uL (ref 150–400)
RBC: 3.11 MIL/uL — ABNORMAL LOW (ref 4.22–5.81)
RDW: 22.7 % — ABNORMAL HIGH (ref 11.5–15.5)
WBC: 7.4 10*3/uL (ref 4.0–10.5)
nRBC: 0 % (ref 0.0–0.2)

## 2022-12-19 LAB — PROTIME-INR
INR: 1.7 — ABNORMAL HIGH (ref 0.8–1.2)
Prothrombin Time: 19.9 seconds — ABNORMAL HIGH (ref 11.4–15.2)

## 2022-12-19 LAB — TROPONIN I (HIGH SENSITIVITY)
Troponin I (High Sensitivity): 9 ng/L (ref <18)
Troponin I (High Sensitivity): 10 ng/L (ref <18)

## 2022-12-19 LAB — TSH: TSH: 2.639 u[IU]/mL (ref 0.350–4.500)

## 2022-12-19 MED ORDER — MEMANTINE HCL 5 MG PO TABS
10.0000 mg | ORAL_TABLET | Freq: Two times a day (BID) | ORAL | Status: DC
  Administered 2022-12-19 – 2022-12-21 (×4): 10 mg via ORAL
  Filled 2022-12-19 (×4): qty 2

## 2022-12-19 MED ORDER — ACETAMINOPHEN 325 MG PO TABS: 650.0000 mg | ORAL_TABLET | Freq: Four times a day (QID) | ORAL | Status: DC | PRN

## 2022-12-19 MED ORDER — VITAMIN B-12 1000 MCG PO TABS
1000.0000 ug | ORAL_TABLET | Freq: Every day | ORAL | Status: DC
  Administered 2022-12-19 – 2022-12-21 (×3): 1000 ug via ORAL
  Filled 2022-12-19 (×3): qty 1

## 2022-12-19 MED ORDER — METOPROLOL TARTRATE 5 MG/5ML IV SOLN: 5.0000 mg | INTRAVENOUS | Status: DC | PRN

## 2022-12-19 MED ORDER — HYDRALAZINE HCL 20 MG/ML IJ SOLN: 10.0000 mg | INTRAMUSCULAR | Status: DC | PRN

## 2022-12-19 MED ORDER — IPRATROPIUM-ALBUTEROL 0.5-2.5 (3) MG/3ML IN SOLN: 3.0000 mL | RESPIRATORY_TRACT | Status: DC | PRN

## 2022-12-19 MED ORDER — PRAVASTATIN SODIUM 20 MG PO TABS
40.0000 mg | ORAL_TABLET | Freq: Every evening | ORAL | Status: DC
  Administered 2022-12-19 – 2022-12-20 (×2): 40 mg via ORAL
  Filled 2022-12-19 (×2): qty 2

## 2022-12-19 MED ORDER — SENNOSIDES-DOCUSATE SODIUM 8.6-50 MG PO TABS: 1.0000 | ORAL_TABLET | Freq: Every evening | ORAL | Status: DC | PRN

## 2022-12-19 MED ORDER — ONDANSETRON HCL 4 MG/2ML IJ SOLN: 4.0000 mg | Freq: Four times a day (QID) | INTRAMUSCULAR | Status: DC | PRN

## 2022-12-19 MED ORDER — DONEPEZIL HCL 5 MG PO TABS
10.0000 mg | ORAL_TABLET | Freq: Every day | ORAL | Status: DC
  Administered 2022-12-19 – 2022-12-20 (×2): 10 mg via ORAL
  Filled 2022-12-19 (×2): qty 2

## 2022-12-19 MED ORDER — SODIUM CHLORIDE 0.9 % IV SOLN: INTRAVENOUS | Status: AC

## 2022-12-19 MED ORDER — GUAIFENESIN 100 MG/5ML PO LIQD: 5.0000 mL | ORAL | Status: DC | PRN

## 2022-12-19 MED ORDER — FERROUS SULFATE 325 (65 FE) MG PO TABS
325.0000 mg | ORAL_TABLET | Freq: Two times a day (BID) | ORAL | Status: DC
  Administered 2022-12-19 – 2022-12-21 (×4): 325 mg via ORAL
  Filled 2022-12-19 (×4): qty 1

## 2022-12-19 MED ORDER — LEVOTHYROXINE SODIUM 88 MCG PO TABS
88.0000 ug | ORAL_TABLET | Freq: Every day | ORAL | Status: DC
  Administered 2022-12-20 – 2022-12-21 (×2): 88 ug via ORAL
  Filled 2022-12-19 (×2): qty 1

## 2022-12-19 MED ORDER — ACETAMINOPHEN 650 MG RE SUPP: 650.0000 mg | Freq: Four times a day (QID) | RECTAL | Status: DC | PRN

## 2022-12-19 MED ORDER — APIXABAN 5 MG PO TABS
5.0000 mg | ORAL_TABLET | Freq: Two times a day (BID) | ORAL | Status: DC
  Administered 2022-12-19 – 2022-12-21 (×4): 5 mg via ORAL
  Filled 2022-12-19 (×4): qty 1

## 2022-12-19 MED ORDER — ONDANSETRON HCL 4 MG PO TABS: 4.0000 mg | ORAL_TABLET | Freq: Four times a day (QID) | ORAL | Status: DC | PRN

## 2022-12-19 NOTE — H&P (Signed)
History and Physical    Roger Cook [id number] DOB: 1927/06/08 DOA: 12/19/2022  PCP: [name], MD Patient coming from: Home  Chief Complaint: Syncope  HPI: Roger Cook is a 89 y.o. male with medical history significant of HTN, dementia, HLD, hypothyroidism, TIA, prostate cancer hospitalized just 10 days ago diagnosed with bilateral PE without DVT and sent home on Eliquis.  Patient was doing well up until earlier today when he was working with therapy and walked up and down his driveway.  After sitting down on the couch all of a sudden he had loss of consciousness without any trauma.  EMS was called and patient gained his consciousness.  During this time the therapy team and spouse was present and did not notice any seizure type of episode.  Patient denied any chest pain or other complaints. In the ED vital signs, CT head, chest x-ray and lab work are unremarkable.  During my evaluation at bedside patient appears to be comfortable and does not have any complaints at all.   Review of Systems: As per HPI otherwise 10 point review of systems negative.  Review of Systems Otherwise negative except as per HPI, including: General: Denies fever, chills, night sweats or unintended weight loss. Resp: Denies cough, wheezing, shortness of breath. Cardiac: Denies chest pain, palpitations, orthopnea, paroxysmal nocturnal dyspnea. GI: Denies abdominal pain, nausea, vomiting, diarrhea or constipation GU: Denies dysuria, frequency, hesitancy or incontinence MS: Denies muscle aches, joint pain or swelling Neuro: Denies headache, neurologic deficits (focal weakness, numbness, tingling), abnormal gait Psych: Denies anxiety, depression, SI/HI/AVH Skin: Denies new rashes or lesions ID: Denies sick contacts, exotic exposures, travel  Past Medical History:  Diagnosis Date   Anemia    Aortic atherosclerosis ([place])    Bell palsy    Bilateral renal cysts    Bladder tumor    CAD (coronary  artery disease)    DOE (dyspnea on exertion)    Elevated lactic acid level 11/13/2022   GERD (gastroesophageal reflux disease)    History of angina    History of penile implant    History of prostate cancer    Hyperlipidemia    Hypertension    Hypothyroidism    Lipoma    MRSA bacteremia 02/2011   Nephrolithiasis    OSA on CPAP    Pneumonia    as a teenager    Past Surgical History:  Procedure Laterality Date   APPENDECTOMY  2005   CARDIAC CATHETERIZATION N/A 08/13/1990   75% pLCx, 75% mLCx, 25% mLAD, 75% D2, 50% right renal artery; Location: [facility]; Surgeon: Doristine Bosworth, MD   CATARACT EXTRACTION  2005   also had a macular hole in 2005   CATARACT EXTRACTION  2008   COLONOSCOPY     2003, 2014   COLONOSCOPY WITH PROPOFOL N/A 12/26/2021   Procedure: COLONOSCOPY WITH PROPOFOL;  Surgeon: Lucilla Lame, MD;  Location: [facility] ENDOSCOPY;  Service: Endoscopy;  Laterality: N/A;   ESOPHAGOGASTRODUODENOSCOPY     2004, 2014   ESOPHAGOGASTRODUODENOSCOPY (EGD) WITH PROPOFOL N/A 12/26/2021   Procedure: ESOPHAGOGASTRODUODENOSCOPY (EGD) WITH PROPOFOL;  Surgeon: Lucilla Lame, MD;  Location: [facility] ENDOSCOPY;  Service: Endoscopy;  Laterality: N/A;   ESOPHAGOGASTRODUODENOSCOPY (EGD) WITH PROPOFOL N/A 11/15/2022   Procedure: ESOPHAGOGASTRODUODENOSCOPY (EGD) WITH PROPOFOL;  Surgeon: Lesly Rubenstein, MD;  Location: [facility] ENDOSCOPY;  Service: Endoscopy;  Laterality: N/A;   HAND SURGERY Left 06/26/2011   Malignancy removed from left hand   [place]  1990   penile inplant  1984   polyp of rectum  2003   PROSTATE SURGERY  1975   Abdominal, had to have radiation treatment with the procedure   PROSTATE SURGERY     TONSILLECTOMY     TRANSURETHRAL RESECTION OF BLADDER TUMOR N/A 03/12/2021   Procedure: TRANSURETHRAL RESECTION OF BLADDER TUMOR (TURBT);  Surgeon: Abbie Sons, MD;  Location: [facility] ORS;  Service: Urology;  Laterality: N/A;    SOCIAL HISTORY:  reports  that he quit smoking about 60 years ago. His smoking use included cigarettes. He has a 16.00 pack-year smoking history. He has never used smokeless tobacco. He reports that he does not currently use alcohol after a past usage of about 2.0 standard drinks of alcohol per week. He reports that he does not use drugs.  Allergies  Allergen Reactions   Levofloxacin Swelling    lips swelling   Povidone Iodine Other (See Comments)    Unknown, can not remember   Sulfa Antibiotics     Unknown, can not remember   Benadryl [Diphenhydramine] Rash    FAMILY HISTORY: Family History  Problem Relation Age of Onset   Lung cancer Mother    Heart disease Father      Prior to Admission medications   Medication Sig Start Date End Date Taking? Authorizing Provider  acetaminophen (TYLENOL) 325 MG tablet Take 2 tablets (650 mg total) by mouth every 6 (six) hours as needed for mild pain (or Fever >/= 101). 12/09/22   Verline Lema, MD  apixaban (ELIQUIS) 5 MG TABS tablet Take 2 tablets (10 mg total) by mouth 2 (two) times daily for 7 days. 12/09/22 12/16/22  Verline Lema, MD  apixaban (ELIQUIS) 5 MG TABS tablet Take 1 tablet (5 mg total) by mouth 2 (two) times daily. 12/17/22   Verline Lema, MD  Apoaequorin (PREVAGEN) 10 MG CAPS Take 10 mg by mouth daily.    [provider]  ARTIFICIAL TEAR SOLUTION OP Place 1 drop into both eyes 2 (two) times daily as needed (dry eyes).    [provider]  brimonidine (ALPHAGAN) 0.2 % ophthalmic solution Place 1 drop into the left eye 2 (two) times daily. 01/25/21   [provider]  Calcium Carbonate-Vit D-Min (CALCIUM 1200 PO) Take by mouth.    [provider]  cyanocobalamin (VITAMIN B12) 1000 MCG tablet Take 1 tablet (1,000 mcg total) by mouth daily. 11/18/22   Nicole Kindred A, DO  donepezil (ARICEPT) 10 MG tablet TAKE 1 TABLET BY MOUTH AT BEDTIME 06/20/21   [name], MD  ferrous sulfate 325 (65 FE) MG tablet Take 1 tablet (325 mg  total) by mouth 2 (two) times daily with a meal. 12/09/22   Verline Lema, MD  fluticasone (FLONASE) 50 MCG/ACT nasal spray Place 2 sprays into both nostrils daily as needed for allergies. 09/25/21   [name], MD  isosorbide mononitrate (IMDUR) 60 MG 24 hr tablet TAKE ONE TABLET EVERY DAY 10/20/22   [name], MD  ketoconazole (NIZORAL) 2 % cream APPLY TO GLANS OF PENIS DAILY 10/08/22   [name], MD  levothyroxine (SYNTHROID) 88 MCG tablet TAKE 1 TABLET EVERY DAY ON EMPTY STOMACHWITH A GLASS OF WATER AT LEAST 30-60 MINBEFORE BREAKFAST 10/29/22   [name], MD  memantine (NAMENDA) 10 MG tablet TAKE 1 TABLET BY MOUTH TWO TIMES DAILY 11/19/21   [name], MD  metoprolol succinate (TOPROL-XL) 25 MG 24 hr tablet  Take 12.5 mg by mouth daily. 01/25/21   [provider]  MULTIPLE VITAMIN PO Take 1 tablet by mouth daily.    [provider]  NON FORMULARY CPAP (Free Text) - Historical Medication  As directed  Started 22-Sep-1994 Active 09/22/1994   [provider]  pravastatin (PRAVACHOL) 40 MG tablet TAKE 1 TABLET BY MOUTH DAILY 11/24/21   [name], MD  PREVIDENT 5000 DRY MOUTH 1.1 % GEL dental gel Place 1 application onto teeth 2 (two) times daily. 02/19/21   [provider]    Physical Exam: Vitals:   12/19/22 1415 12/19/22 1416 12/19/22 1626  BP: (!) 141/43  (!) 176/65  Pulse: 70  60  Resp: 16  19  Temp:   (!) 97.5 F (36.4 C)  TempSrc:   Oral  SpO2: 99%  99%  Weight:  68 kg   Height:  5\' 7"  (1.702 m)       Constitutional: NAD, calm, comfortable, elderly frail Eyes: PERRL, lids and conjunctivae normal ENMT: Mucous membranes are moist. Posterior pharynx clear of any exudate or lesions.Normal dentition.  Neck: normal, supple, no masses, no thyromegaly Respiratory: clear to auscultation bilaterally, no wheezing, no crackles. Normal respiratory effort. No accessory muscle use.  Cardiovascular: Regular rate and rhythm, no  murmurs / rubs / gallops. No extremity edema. 2+ pedal pulses. No carotid bruits.  Abdomen: no tenderness, no masses palpated. No hepatosplenomegaly. Bowel sounds positive.  Musculoskeletal: no clubbing / cyanosis. No joint deformity upper and lower extremities. Good ROM, no contractures. Normal muscle tone.  Skin: no rashes, lesions, ulcers. No induration Neurologic: CN 2-12 grossly intact. Sensation intact, DTR normal. Strength 5/5 in all 4.  Psychiatric: Normal judgment and insight. Alert and oriented x 3. Normal mood.     Labs on Admission: I have personally reviewed following labs and imaging studies  CBC: Recent Labs  Lab 12/19/22 1443  WBC 7.4  HGB 8.0*  HCT 27.0*  MCV 86.8  PLT 123456   Basic Metabolic Panel: Recent Labs  Lab 12/19/22 1443  NA 137  K 4.4  CL 105  CO2 20*  GLUCOSE 134*  BUN 26*  CREATININE 1.56*  CALCIUM 9.0   GFR: Estimated Creatinine Clearance: 27.7 mL/min (A) (by C-G formula based on SCr of 1.56 mg/dL (H)). Liver Function Tests: No results for input(s): "AST", "ALT", "ALKPHOS", "BILITOT", "PROT", "ALBUMIN" in the last 168 hours. No results for input(s): "LIPASE", "AMYLASE" in the last 168 hours. No results for input(s): "AMMONIA" in the last 168 hours. Coagulation Profile: No results for input(s): "INR", "PROTIME" in the last 168 hours. Cardiac Enzymes: No results for input(s): "CKTOTAL", "CKMB", "CKMBINDEX", "TROPONINI" in the last 168 hours. BNP (last 3 results) No results for input(s): "PROBNP" in the last 8760 hours. HbA1C: No results for input(s): "HGBA1C" in the last 72 hours. CBG: No results for input(s): "GLUCAP" in the last 168 hours. Lipid Profile: No results for input(s): "CHOL", "HDL", "LDLCALC", "TRIG", "CHOLHDL", "LDLDIRECT" in the last 72 hours. Thyroid Function Tests: No results for input(s): "TSH", "T4TOTAL", "FREET4", "T3FREE", "THYROIDAB" in the last 72 hours. Anemia Panel: No results for input(s): "VITAMINB12",  "FOLATE", "FERRITIN", "TIBC", "IRON", "RETICCTPCT" in the last 72 hours. Urine analysis:    Component Value Date/Time   COLORURINE YELLOW (A) 12/06/2022 1738   APPEARANCEUR CLEAR (A) 12/06/2022 1738   APPEARANCEUR Cloudy (A) 02/28/2021 1454   LABSPEC 1.017 12/06/2022 1738   LABSPEC 1.018 10/21/2012 1747   PHURINE 6.0 12/06/2022 1738   GLUCOSEU  NEGATIVE 12/06/2022 1738   GLUCOSEU Negative 10/21/2012 1747   HGBUR NEGATIVE 12/06/2022 1738   BILIRUBINUR NEGATIVE 12/06/2022 1738   BILIRUBINUR Negative 02/28/2021 1454   BILIRUBINUR Negative 10/21/2012 [place] 12/06/2022 1738   PROTEINUR 30 (A) 12/06/2022 1738   NITRITE NEGATIVE 12/06/2022 1738   LEUKOCYTESUR NEGATIVE 12/06/2022 1738   LEUKOCYTESUR Negative 10/21/2012 1747   Sepsis Labs: !!!!!!!!!!!!!!!!!!!!!!!!!!!!!!!!!!!!!!!!!!!! @LABRCNTIP (procalcitonin:4,lacticidven:4) )No results found for this or any previous visit (from the past 240 hour(s)).   Radiological Exams on Admission: DG Chest Portable 1 View  Result Date: 12/19/2022 CLINICAL DATA:  Provided history: Syncope. EXAM: PORTABLE CHEST 1 VIEW COMPARISON:  Prior chest radiographs 12/06/2022 and earlier. FINDINGS: Heart size within normal limits. Aortic atherosclerosis. Small ill-defined opacity within the right lung base, decreased in size from the prior chest radiographs of 12/06/2022. Elsewhere, there is no appreciable airspace consolidation. No evidence of pleural effusion or pneumothorax. No acute osseous abnormality identified. IMPRESSION: 1. Small ill-defined opacity within the right lung base, decreased in size from the prior chest radiographs of 12/06/2022. Correlating with the prior chest CT of 12/06/2022, this likely reflects atelectasis and/or scarring. 2. Aortic Atherosclerosis (ICD10-I70.0). Electronically Signed   By: Kellie Simmering D.O.   On: 12/19/2022 16:24   CT HEAD WO CONTRAST (5MM)  Result Date: 12/19/2022 CLINICAL DATA:  Mental status change  EXAM: CT HEAD WITHOUT CONTRAST TECHNIQUE: Contiguous axial images were obtained from the base of the skull through the vertex without intravenous contrast. RADIATION DOSE REDUCTION: This exam was performed according to the departmental dose-optimization program which includes automated exposure control, adjustment of the mA and/or kV according to patient size and/or use of iterative reconstruction technique. COMPARISON:  12/06/2022 FINDINGS: Brain: No acute territorial infarction, hemorrhage or intracranial mass. Atrophy and chronic small vessel ischemic changes of the white matter. Stable ventricle size. Remote lacunar infarct left basal ganglia Vascular: No hyperdense vessels.  Carotid vascular calcification Skull: Normal. Negative for fracture or focal lesion. Sinuses/Orbits: Postsurgical changes of the sinuses. Moderate mucosal thickening left maxillary sinus and opacified frontal sinuses. Other: None IMPRESSION: 1. No CT evidence for acute intracranial abnormality. 2. Atrophy and chronic small vessel ischemic changes of the white matter. Electronically Signed   By: Donavan Foil M.D.   On: 12/19/2022 16:17     All images have been reviewed by me personally.  EKG: Independently reviewed.  Nonischemic  Assessment/Plan Principal Problem:   Syncope Active Problems:   Chronic anemia   Bilateral pulmonary embolism (HCC)   Arteriosclerosis of coronary artery   Essential hypertension   Dementia (HCC)   Chronic kidney disease, stage 3a (HCC)    Syncope/loss of consciousness - No head trauma.  Admit for observation.  CT head is negative.  Now back to baseline.  Will hold off on metoprolol and Imdur.  IV as needed ordered.  Gentle fluids.  Check TSH.  UA.  Monitor on telemetry.  May require Zio patch prior to discharge. -Given his history of bilateral PE, CTA ordered to reevaluate.  Patient does admit of medication compliance.    Bilateral pulmonary embolism without cor pulmonale - Was on outpatient  Eliquis, will continue.  Admits of medication compliance.  Seen by hematology and vascular on previous admission.  Prior history of GI bleed, chronic anemia - History of AVMs on colonoscopy in 2023, EGD February 2024-unremarkable.  Right side renal hematoma.  Hemoglobin currently around baseline of 8.0.  No obvious signs of bleeding.  Continue to monitor.  CKD stage  IIIa -Creatinine around baseline of 1.5  Dementia -Namenda, Aricept  Hypothyroidism -Check TSH.  Synthroid  Essential hypertension - Home p.o. meds on hold.  IV as needed  Obstructive sleep apnea - CPAP    DVT prophylaxis: On Eliquis Code Status: Full code Family Communication: Spouse at bedside Consults called: None Admission status: Telemetry  Telemetry observation   Time Spent: 65 minutes.  >50% of the time was devoted to discussing the patients care, assessment, plan and disposition with other care givers along with counseling the patient about the risks and benefits of treatment.    Cachet Mccutchen Arsenio Loader MD [facility]  If 7PM-7AM, please contact night-coverage   12/19/2022, 5:39 PM

## 2022-12-19 NOTE — ED Triage Notes (Signed)
Pt to ED [facility] from home for syncope after walking with PT to end of driveway and back. Sat in chair and had syncopal episode. +blood thinner. Denies pain. Denies dizziness.  Disoriented to year.

## 2022-12-19 NOTE — ED Provider Notes (Signed)
[facility] Provider Note    Event Date/Time   First MD Initiated Contact with Patient 12/19/22 1525     (approximate)   History   Loss of Consciousness   HPI  Roger Cook is a 89 y.o. male here with loss of consciousness.  The patient states that he was in his usual state of health today.  He walked back-and-forth down his driveway 2 or 3 times with PT.  He went inside.  Then reportedly slumped over and lost consciousness.  Unconscious for several minutes.  When EMS got there, he slowly returned to his baseline.  He had a similar episode recently and was diagnosed with a PE as well as 1 episode with anemia.  He has been taking his anticoagulation.  No leg swelling.  No chest pain.  No other complaints.     Physical Exam   Triage Vital Signs: ED Triage Vitals  Enc Vitals Group     BP 12/19/22 1415 (!) 141/43     Pulse Rate 12/19/22 1415 70     Resp 12/19/22 1415 16     Temp 12/19/22 1626 (!) 97.5 F (36.4 C)     Temp Source 12/19/22 1626 Oral     SpO2 12/19/22 1415 99 %     Weight 12/19/22 1416 150 lb (68 kg)     Height 12/19/22 1416 5\' 7"  (1.702 m)     Head Circumference --      Peak Flow --      Pain Score 12/19/22 1415 0     Pain Loc --      Pain Edu? --      Excl. in [place]? --     Most recent vital signs: Vitals:   12/19/22 1837 12/19/22 1902  BP: (!) 149/57 (!) 164/65  Pulse:  72  Resp: (!) 33 18  Temp:  98.2 F (36.8 C)  SpO2:  100%     General: Awake, no distress.  CV:  Good peripheral perfusion.  Regular rate and rhythm. Resp:  Normal work of breathing.  Lungs clear. Abd:  No distention.  No tenderness. Other:  No significant leg swelling.   ED Results / Procedures / Treatments   Labs (all labs ordered are listed, but only abnormal results are displayed) Labs Reviewed  BASIC METABOLIC PANEL - Abnormal; Notable for the following components:      Result Value   CO2 20 (*)    Glucose, Bld 134 (*)    BUN 26 (*)     Creatinine, Ser 1.56 (*)    GFR, Estimated 41 (*)    All other components within normal limits  CBC - Abnormal; Notable for the following components:   RBC 3.11 (*)    Hemoglobin 8.0 (*)    HCT 27.0 (*)    MCH 25.7 (*)    MCHC 29.6 (*)    RDW 22.7 (*)    All other components within normal limits  PROTIME-INR - Abnormal; Notable for the following components:   Prothrombin Time 19.9 (*)    INR 1.7 (*)    All other components within normal limits  TSH  URINALYSIS, ROUTINE W REFLEX MICROSCOPIC  BASIC METABOLIC PANEL  CBC  MAGNESIUM  TROPONIN I (HIGH SENSITIVITY)  TROPONIN I (HIGH SENSITIVITY)     EKG Normal sinus rhythm, trickle rate 64.  PR 172, QRS 82, QTc 433.  No acute ST elevation or depression.  No acute evidence of acute ischemic infarct.  RADIOLOGY CT head: No acute intracranial malady Chest x-ray: Clear   I also independently reviewed and agree with radiologist interpretations.   PROCEDURES:  Critical Care performed: No    MEDICATIONS ORDERED IN ED: Medications  pravastatin (PRAVACHOL) tablet 40 mg (40 mg Oral Given 12/19/22 2223)  donepezil (ARICEPT) tablet 10 mg (10 mg Oral Given 12/19/22 2223)  memantine (NAMENDA) tablet 10 mg (10 mg Oral Given 12/19/22 2223)  levothyroxine (SYNTHROID) tablet 88 mcg (has no administration in time range)  apixaban (ELIQUIS) tablet 5 mg (5 mg Oral Given 12/19/22 2223)  cyanocobalamin (VITAMIN B12) tablet 1,000 mcg (1,000 mcg Oral Given 12/19/22 2223)  ferrous sulfate tablet 325 mg (325 mg Oral Given 12/19/22 2223)  acetaminophen (TYLENOL) tablet 650 mg (has no administration in time range)    Or  acetaminophen (TYLENOL) suppository 650 mg (has no administration in time range)  ondansetron (ZOFRAN) tablet 4 mg (has no administration in time range)    Or  ondansetron (ZOFRAN) injection 4 mg (has no administration in time range)  ipratropium-albuterol (DUONEB) 0.5-2.5 (3) MG/3ML nebulizer solution 3 mL (has no administration in  time range)  hydrALAZINE (APRESOLINE) injection 10 mg (has no administration in time range)  metoprolol tartrate (LOPRESSOR) injection 5 mg (has no administration in time range)  senna-docusate (Senokot-S) tablet 1 tablet (has no administration in time range)  guaiFENesin (ROBITUSSIN) 100 MG/5ML liquid 5 mL (has no administration in time range)  0.9 %  sodium chloride infusion ( Intravenous New Bag/Given 12/19/22 1900)     IMPRESSION / MDM / ASSESSMENT AND PLAN / ED COURSE  I reviewed the triage vital signs and the nursing notes.                              Differential diagnosis includes, but is not limited to, arrhythmia, anemia, recurrent PE, orthostasis, vasovagal syncope, new valvular disease, unlikely seizure or CVA  Patient's presentation is most consistent with acute presentation with potential threat to life or bodily function.  The patient is on the cardiac monitor to evaluate for evidence of arrhythmia and/or significant heart rate changes   89 yo M with h/o recent PEs here with syncopal episode. H/o same with 2 recent admissions. Hgb is at baseline. Trop negative and EKG is nonischemic. No focal neuro deficits. CBC with baseline anemia. Will admit for observation. CXR shows chronic changes. CT head reviewed and is negative.   FINAL CLINICAL IMPRESSION(S) / ED DIAGNOSES   Final diagnoses:  Syncope, unspecified syncope type     Rx / DC Orders   ED Discharge Orders     None        Note:  This document was prepared using Dragon voice recognition software and may include unintentional dictation errors.   Duffy Bruce, MD 12/20/22 [phone]

## 2022-12-19 NOTE — ED Notes (Signed)
Lab contacted for labs, difficult stick

## 2022-12-20 ENCOUNTER — Encounter: Payer: Self-pay | Admitting: Internal Medicine

## 2022-12-20 ENCOUNTER — Observation Stay: Payer: Medicare Other

## 2022-12-20 DIAGNOSIS — E039 Hypothyroidism, unspecified: Secondary | ICD-10-CM | POA: Diagnosis present

## 2022-12-20 DIAGNOSIS — Z79890 Hormone replacement therapy (postmenopausal): Secondary | ICD-10-CM | POA: Diagnosis not present

## 2022-12-20 DIAGNOSIS — I129 Hypertensive chronic kidney disease with stage 1 through stage 4 chronic kidney disease, or unspecified chronic kidney disease: Secondary | ICD-10-CM | POA: Diagnosis present

## 2022-12-20 DIAGNOSIS — R55 Syncope and collapse: Secondary | ICD-10-CM | POA: Diagnosis present

## 2022-12-20 DIAGNOSIS — Z79899 Other long term (current) drug therapy: Secondary | ICD-10-CM | POA: Diagnosis not present

## 2022-12-20 DIAGNOSIS — Z87891 Personal history of nicotine dependence: Secondary | ICD-10-CM | POA: Diagnosis not present

## 2022-12-20 DIAGNOSIS — I2510 Atherosclerotic heart disease of native coronary artery without angina pectoris: Secondary | ICD-10-CM | POA: Diagnosis present

## 2022-12-20 DIAGNOSIS — J9811 Atelectasis: Secondary | ICD-10-CM | POA: Diagnosis not present

## 2022-12-20 DIAGNOSIS — G510 Bell's palsy: Secondary | ICD-10-CM | POA: Diagnosis present

## 2022-12-20 DIAGNOSIS — Z8249 Family history of ischemic heart disease and other diseases of the circulatory system: Secondary | ICD-10-CM | POA: Diagnosis not present

## 2022-12-20 DIAGNOSIS — Z882 Allergy status to sulfonamides status: Secondary | ICD-10-CM | POA: Diagnosis not present

## 2022-12-20 DIAGNOSIS — Z960 Presence of urogenital implants: Secondary | ICD-10-CM | POA: Diagnosis present

## 2022-12-20 DIAGNOSIS — E785 Hyperlipidemia, unspecified: Secondary | ICD-10-CM | POA: Diagnosis present

## 2022-12-20 DIAGNOSIS — Z91048 Other nonmedicinal substance allergy status: Secondary | ICD-10-CM | POA: Diagnosis not present

## 2022-12-20 DIAGNOSIS — Z801 Family history of malignant neoplasm of trachea, bronchus and lung: Secondary | ICD-10-CM | POA: Diagnosis not present

## 2022-12-20 DIAGNOSIS — Z7901 Long term (current) use of anticoagulants: Secondary | ICD-10-CM | POA: Diagnosis not present

## 2022-12-20 DIAGNOSIS — Z888 Allergy status to other drugs, medicaments and biological substances status: Secondary | ICD-10-CM | POA: Diagnosis not present

## 2022-12-20 DIAGNOSIS — Z8546 Personal history of malignant neoplasm of prostate: Secondary | ICD-10-CM | POA: Diagnosis not present

## 2022-12-20 DIAGNOSIS — I2699 Other pulmonary embolism without acute cor pulmonale: Secondary | ICD-10-CM | POA: Diagnosis present

## 2022-12-20 DIAGNOSIS — F0390 Unspecified dementia without behavioral disturbance: Secondary | ICD-10-CM | POA: Diagnosis present

## 2022-12-20 DIAGNOSIS — Z8673 Personal history of transient ischemic attack (TIA), and cerebral infarction without residual deficits: Secondary | ICD-10-CM | POA: Diagnosis not present

## 2022-12-20 DIAGNOSIS — K219 Gastro-esophageal reflux disease without esophagitis: Secondary | ICD-10-CM | POA: Diagnosis present

## 2022-12-20 DIAGNOSIS — D649 Anemia, unspecified: Secondary | ICD-10-CM | POA: Diagnosis present

## 2022-12-20 DIAGNOSIS — G4733 Obstructive sleep apnea (adult) (pediatric): Secondary | ICD-10-CM | POA: Diagnosis present

## 2022-12-20 DIAGNOSIS — N1831 Chronic kidney disease, stage 3a: Secondary | ICD-10-CM | POA: Diagnosis present

## 2022-12-20 LAB — BASIC METABOLIC PANEL
Anion gap: 11 (ref 5–15)
BUN: 23 mg/dL (ref 8–23)
CO2: 20 mmol/L — ABNORMAL LOW (ref 22–32)
Calcium: 8.5 mg/dL — ABNORMAL LOW (ref 8.9–10.3)
Chloride: 108 mmol/L (ref 98–111)
Creatinine, Ser: 1.34 mg/dL — ABNORMAL HIGH (ref 0.61–1.24)
GFR, Estimated: 49 mL/min — ABNORMAL LOW (ref >60)
Glucose, Bld: 95 mg/dL (ref 70–99)
Potassium: 3.9 mmol/L (ref 3.5–5.1)
Sodium: 139 mmol/L (ref 135–145)

## 2022-12-20 LAB — CBC
HCT: 23.6 % — ABNORMAL LOW (ref 39.0–52.0)
Hemoglobin: 6.9 g/dL — ABNORMAL LOW (ref 13.0–17.0)
MCH: 25.2 pg — ABNORMAL LOW (ref 26.0–34.0)
MCHC: 29.2 g/dL — ABNORMAL LOW (ref 30.0–36.0)
MCV: 86.1 fL (ref 80.0–100.0)
Platelets: 243 10*3/uL (ref 150–400)
RBC: 2.74 MIL/uL — ABNORMAL LOW (ref 4.22–5.81)
RDW: 22.9 % — ABNORMAL HIGH (ref 11.5–15.5)
WBC: 6.1 10*3/uL (ref 4.0–10.5)
nRBC: 0 % (ref 0.0–0.2)

## 2022-12-20 LAB — URINALYSIS, ROUTINE W REFLEX MICROSCOPIC
Bacteria, UA: NONE SEEN
Bilirubin Urine: NEGATIVE
Glucose, UA: NEGATIVE mg/dL
Ketones, ur: NEGATIVE mg/dL
Leukocytes,Ua: NEGATIVE
Nitrite: NEGATIVE
Protein, ur: NEGATIVE mg/dL
RBC / HPF: >50 RBC/hpf (ref 0–5)
Specific Gravity, Urine: >1.046 — ABNORMAL HIGH (ref 1.005–1.030)
pH: 5 (ref 5.0–8.0)

## 2022-12-20 LAB — MAGNESIUM: Magnesium: 2.4 mg/dL (ref 1.7–2.4)

## 2022-12-20 LAB — PREPARE RBC (CROSSMATCH)

## 2022-12-20 LAB — HEMOGLOBIN: Hemoglobin: 10.2 g/dL — ABNORMAL LOW (ref 13.0–17.0)

## 2022-12-20 MED ORDER — SODIUM CHLORIDE 0.9% IV SOLUTION: Freq: Once | INTRAVENOUS | Status: AC

## 2022-12-20 MED ORDER — IOHEXOL 350 MG/ML SOLN
75.0000 mL | Freq: Once | INTRAVENOUS | Status: AC | PRN
  Administered 2022-12-20: 75 mL via INTRAVENOUS

## 2022-12-20 NOTE — Progress Notes (Signed)
OT Cancellation Note  Patient Details Name: Roger Cook MRN: [id number] DOB: 10-14-1926   Cancelled Treatment:    Reason Eval/Treat Not Completed: Medical issues which prohibited therapy. OT orders received, chart reviewed. Pt with hemoglobin of 6.9 this morning which is contraindicated for participation in therapy. Pt scheduled to receive transfusion today. Will re-attempt as pt medically appropriate.   Doneta Public 12/20/2022, 8:27 AM

## 2022-12-20 NOTE — Progress Notes (Signed)
PT Cancellation Note  Patient Details Name: Roger Cook MRN: [id number] DOB: 07/01/1927   Cancelled Treatment:    Reason Eval/Treat Not Completed: Medical issues which prohibited therapy.  PT orders received, chart reviewed.  Pt with hemoglobin of 6.9 this morning which is contraindicated for participation in therapy.  Pt scheduled to receive transfusion today. Will re-attempt as pt medically appropriate.    Gwenlyn Saran, PT, DPT Physical Therapist - [facility]  12/20/22, 1:23 PM

## 2022-12-20 NOTE — Progress Notes (Addendum)
PROGRESS NOTE    Roger Cook  [id number] DOB: [birth year] DOA: 12/19/2022 PCP: [name], MD   Brief Narrative:  89 y.o. male with medical history significant of HTN, dementia, HLD, hypothyroidism, TIA, prostate cancer hospitalized just 10 days ago diagnosed with bilateral PE without DVT and sent home on Eliquis.  Patient admitted for syncopal episode.  CTA chest showed improved clot burden.  Following days hemoglobin was 6.9 without any obvious evidence of gross bleeding.  Units PRBC transfusion ordered.   Assessment & Plan:  Principal Problem:   Syncope Active Problems:   Chronic anemia   Bilateral pulmonary embolism (HCC)   Arteriosclerosis of coronary artery   Essential hypertension   Dementia (HCC)   Chronic kidney disease, stage 3a (HCC)    Syncope/loss of consciousness - No head trauma.  Admit for observation.  CT head is negative.  Now back to baseline.  Slowly resume his antihypertensive.  TSH normal, UA pending.  Gentle hydration as needed.  Will check orthostatics.  May benefit from [facility] cardiology notified.    Bilateral pulmonary embolism without cor pulmonale - Was on outpatient Eliquis, will continue.  Admits of medication compliance.  Seen by hematology and vascular on previous admission. Repeat CTA this admission shows improvement in clot burden   Prior history of GI bleed, chronic anemia - History of AVMs on colonoscopy in 2023, EGD February 2024-unremarkable.  Right side renal hematoma.  No obvious bleeding but hemoglobin drifted down, 2 units PRBC transfusion   CKD stage IIIa -Creatinine around baseline of 1.5   Dementia -Namenda, Aricept   Hypothyroidism -Synthroid   Essential hypertension - Home p.o. meds on hold.  IV as needed   Obstructive sleep apnea - CPAP, refusing    DVT prophylaxis: Eliquis Code Status: Full code Family Communication: Spoke with family over the phone        Diet Orders (From admission, onward)      Start     Ordered   12/19/22 1732  Diet Heart Room service appropriate? Yes; Fluid consistency: Thin  Diet effective now       Question Answer Comment  Room service appropriate? Yes   Fluid consistency: Thin      12/19/22 1731            Subjective: No new complaints.  Pleasantly confused.  No gross signs of bleeding   Examination:  General exam: Appears calm and comfortable  Respiratory system: Clear to auscultation. Respiratory effort normal. Cardiovascular system: S1 & S2 heard, RRR. No JVD, murmurs, rubs, gallops or clicks. No pedal edema. Gastrointestinal system: Abdomen is nondistended, soft and nontender. No organomegaly or masses felt. Normal bowel sounds heard. Central nervous system: Alert and oriented to name.  Grossly moving all the extremities Extremities: Symmetric 4 x 5 power. Skin: No rashes, lesions or ulcers Psychiatry: Judgement and insight appear poor Objective: Vitals:   12/19/22 1837 12/19/22 1902 12/20/22 0510 12/20/22 0749  BP: (!) 149/57 (!) 164/65 (!) 119/34 (!) 175/56  Pulse:  72 61 69  Resp: (!) 33 18 20 18   Temp:  98.2 F (36.8 C) 98 F (36.7 C) 97.7 F (36.5 C)  TempSrc:  Oral Oral   SpO2:  100% 97% 97%  Weight:      Height:        Intake/Output Summary (Last 24 hours) at 12/20/2022 0759 Last data filed at 12/20/2022 0500 Gross per 24 hour  Intake 430.97 ml  Output 400 ml  Net 30.97  ml   Filed Weights   12/19/22 1416  Weight: 68 kg    Scheduled Meds:  sodium chloride   Intravenous Once   apixaban  5 mg Oral BID   cyanocobalamin  1,000 mcg Oral Daily   donepezil  10 mg Oral QHS   ferrous sulfate  325 mg Oral BID WC   levothyroxine  88 mcg Oral Q0600   memantine  10 mg Oral BID   pravastatin  40 mg Oral QPM   Continuous Infusions:  sodium chloride 50 mL/hr at 12/20/22 0341    Nutritional status     Body mass index is 23.49 kg/m.  Data Reviewed:   CBC: Recent Labs  Lab 12/19/22 1443 12/20/22 0332  WBC  7.4 6.1  HGB 8.0* 6.9*  HCT 27.0* 23.6*  MCV 86.8 86.1  PLT 315 0000000   Basic Metabolic Panel: Recent Labs  Lab 12/19/22 1443 12/20/22 0332  NA 137 139  K 4.4 3.9  CL 105 108  CO2 20* 20*  GLUCOSE 134* 95  BUN 26* 23  CREATININE 1.56* 1.34*  CALCIUM 9.0 8.5*  MG  --  2.4   GFR: Estimated Creatinine Clearance: 32.2 mL/min (A) (by C-G formula based on SCr of 1.34 mg/dL (H)). Liver Function Tests: No results for input(s): "AST", "ALT", "ALKPHOS", "BILITOT", "PROT", "ALBUMIN" in the last 168 hours. No results for input(s): "LIPASE", "AMYLASE" in the last 168 hours. No results for input(s): "AMMONIA" in the last 168 hours. Coagulation Profile: Recent Labs  Lab 12/19/22 1913  INR 1.7*   Cardiac Enzymes: No results for input(s): "CKTOTAL", "CKMB", "CKMBINDEX", "TROPONINI" in the last 168 hours. BNP (last 3 results) No results for input(s): "PROBNP" in the last 8760 hours. HbA1C: No results for input(s): "HGBA1C" in the last 72 hours. CBG: No results for input(s): "GLUCAP" in the last 168 hours. Lipid Profile: No results for input(s): "CHOL", "HDL", "LDLCALC", "TRIG", "CHOLHDL", "LDLDIRECT" in the last 72 hours. Thyroid Function Tests: Recent Labs    12/19/22 1913  TSH 2.639   Anemia Panel: No results for input(s): "VITAMINB12", "FOLATE", "FERRITIN", "TIBC", "IRON", "RETICCTPCT" in the last 72 hours. Sepsis Labs: No results for input(s): "PROCALCITON", "LATICACIDVEN" in the last 168 hours.  No results found for this or any previous visit (from the past 240 hour(s)).       Radiology Studies: CT Angio Chest PE W and/or Wo Contrast  Result Date: 12/20/2022 CLINICAL DATA:  Pulmonary embolism suspected EXAM: CT ANGIOGRAPHY CHEST WITH CONTRAST TECHNIQUE: Multidetector CT imaging of the chest was performed using the standard protocol during bolus administration of intravenous contrast. Multiplanar CT image reconstructions and MIPs were obtained to evaluate the vascular  anatomy. RADIATION DOSE REDUCTION: This exam was performed according to the departmental dose-optimization program which includes automated exposure control, adjustment of the mA and/or kV according to patient size and/or use of iterative reconstruction technique. CONTRAST:  85mL OMNIPAQUE IOHEXOL 350 MG/ML SOLN COMPARISON:  12/06/2022 FINDINGS: Cardiovascular: Known recent pulmonary emboli. Clot burden is definitely improved, clot in the lower lobe bronchi is no longer occlusive. Lingular clot is also notably diminished and right middle lobe clot is no longer seen. No right heart dilatation. Atherosclerotic calcification of the aorta and coronaries. Mediastinum/Nodes: No adenopathy or mass. Lungs/Pleura: Mild dependent atelectasis. There is no edema, consolidation, effusion, or pneumothorax. Upper Abdomen: No acute finding Musculoskeletal: Chronic T3 compression fracture. T2-3 a T3-4 facet ankylosis Review of the MIP images confirms the above findings. IMPRESSION: Recent pulmonary emboli with significantly improved  clot burden since 12/06/2022. Nonocclusive clot is still seen in the lower lobes and lingula. No new abnormality. Electronically Signed   By: Jorje Guild M.D.   On: 12/20/2022 07:44   DG Chest Portable 1 View  Result Date: 12/19/2022 CLINICAL DATA:  Provided history: Syncope. EXAM: PORTABLE CHEST 1 VIEW COMPARISON:  Prior chest radiographs 12/06/2022 and earlier. FINDINGS: Heart size within normal limits. Aortic atherosclerosis. Small ill-defined opacity within the right lung base, decreased in size from the prior chest radiographs of 12/06/2022. Elsewhere, there is no appreciable airspace consolidation. No evidence of pleural effusion or pneumothorax. No acute osseous abnormality identified. IMPRESSION: 1. Small ill-defined opacity within the right lung base, decreased in size from the prior chest radiographs of 12/06/2022. Correlating with the prior chest CT of 12/06/2022, this likely reflects  atelectasis and/or scarring. 2. Aortic Atherosclerosis (ICD10-I70.0). Electronically Signed   By: Kellie Simmering D.O.   On: 12/19/2022 16:24   CT HEAD WO CONTRAST (5MM)  Result Date: 12/19/2022 CLINICAL DATA:  Mental status change EXAM: CT HEAD WITHOUT CONTRAST TECHNIQUE: Contiguous axial images were obtained from the base of the skull through the vertex without intravenous contrast. RADIATION DOSE REDUCTION: This exam was performed according to the departmental dose-optimization program which includes automated exposure control, adjustment of the mA and/or kV according to patient size and/or use of iterative reconstruction technique. COMPARISON:  12/06/2022 FINDINGS: Brain: No acute territorial infarction, hemorrhage or intracranial mass. Atrophy and chronic small vessel ischemic changes of the white matter. Stable ventricle size. Remote lacunar infarct left basal ganglia Vascular: No hyperdense vessels.  Carotid vascular calcification Skull: Normal. Negative for fracture or focal lesion. Sinuses/Orbits: Postsurgical changes of the sinuses. Moderate mucosal thickening left maxillary sinus and opacified frontal sinuses. Other: None IMPRESSION: 1. No CT evidence for acute intracranial abnormality. 2. Atrophy and chronic small vessel ischemic changes of the white matter. Electronically Signed   By: Donavan Foil M.D.   On: 12/19/2022 16:17           LOS: 0 days   Time spent= 35 mins    Agapito Hanway Arsenio Loader, MD [facility]  If 7PM-7AM, please contact night-coverage  12/20/2022, 7:59 AM

## 2022-12-20 NOTE — Progress Notes (Signed)
Cpap refused.  Pt states he does not use cpap at home

## 2022-12-21 DIAGNOSIS — R55 Syncope and collapse: Secondary | ICD-10-CM | POA: Diagnosis not present

## 2022-12-21 LAB — TYPE AND SCREEN
ABO/RH(D): AB POS
Antibody Screen: NEGATIVE
Unit division: 0

## 2022-12-21 LAB — BPAM RBC
Blood Product Expiration Date: 202404192359
ISSUE DATE / TIME: 202403301022
Unit Type and Rh: 6200

## 2022-12-21 LAB — BASIC METABOLIC PANEL
Anion gap: 7 (ref 5–15)
BUN: 25 mg/dL — ABNORMAL HIGH (ref 8–23)
CO2: 21 mmol/L — ABNORMAL LOW (ref 22–32)
Calcium: 8.5 mg/dL — ABNORMAL LOW (ref 8.9–10.3)
Chloride: 110 mmol/L (ref 98–111)
Creatinine, Ser: 1.26 mg/dL — ABNORMAL HIGH (ref 0.61–1.24)
GFR, Estimated: 53 mL/min — ABNORMAL LOW (ref >60)
Glucose, Bld: 117 mg/dL — ABNORMAL HIGH (ref 70–99)
Potassium: 3.8 mmol/L (ref 3.5–5.1)
Sodium: 138 mmol/L (ref 135–145)

## 2022-12-21 LAB — MAGNESIUM: Magnesium: 2.6 mg/dL — ABNORMAL HIGH (ref 1.7–2.4)

## 2022-12-21 LAB — CBC
HCT: 30.6 % — ABNORMAL LOW (ref 39.0–52.0)
Hemoglobin: 9.7 g/dL — ABNORMAL LOW (ref 13.0–17.0)
MCH: 26.9 pg (ref 26.0–34.0)
MCHC: 31.7 g/dL (ref 30.0–36.0)
MCV: 85 fL (ref 80.0–100.0)
Platelets: 255 10*3/uL (ref 150–400)
RBC: 3.6 MIL/uL — ABNORMAL LOW (ref 4.22–5.81)
RDW: 21.5 % — ABNORMAL HIGH (ref 11.5–15.5)
WBC: 6.9 10*3/uL (ref 4.0–10.5)
nRBC: 0 % (ref 0.0–0.2)

## 2022-12-21 NOTE — TOC Initial Note (Signed)
Transition of Care [facility]) - Initial/Assessment Note    Patient Details  Name: Roger Cook MRN: [id number] Date of Birth: [birth year]  [facility] CM/SW Contact:    Valente David, RN Phone Number: 12/21/2022, 1:02 PM  Clinical Narrative:                  Patient admitted from home with wife, Dr. Caryn Section is PCP.  Was recently discharged home with [facility] for PT/RN, Corene Cornea aware that patient is back in hospital and plan to be discharged again today.  Spoke with patient, state wife will provide transportation home once she is back in town for [facility] this afternoon.  Has walker at home, denies need for additional DME.   Expected Discharge Plan: [place] Barriers to Discharge: Continued Medical Work up   Patient Goals and CMS Choice Patient states their goals for this hospitalization and ongoing recovery are:: Home with HHPT          Expected Discharge Plan and Services     Post Acute Care Choice: [place] arrangements for the past 2 months: Single Family Home Expected Discharge Date: 12/21/22                         HH Arranged: PT, RN [place] Agency: [place] ([place]) Date HH Agency Contacted: 12/21/22 Time HH Agency Contacted: 24 Representative spoke with at [place]: Corene Cornea  Prior Living Arrangements/Services Living arrangements for the past 2 months: [place] Lives with:: Spouse Patient language and need for interpreter reviewed:: Yes Do you feel safe going back to the place where you live?: Yes      Need for Family Participation in Patient Care: Yes (Comment) Care giver support system in place?: Yes (comment) Current home services: Home PT, Home RN, DME Criminal Activity/Legal Involvement Pertinent to Current Situation/Hospitalization: No - Comment as needed  Activities of Daily Living Home Assistive Devices/Equipment: Cane (specify quad or straight) ADL Screening (condition at time of  admission) Patient's cognitive ability adequate to safely complete daily activities?: Yes Is the patient deaf or have difficulty hearing?: No Does the patient have difficulty seeing, even when wearing glasses/contacts?: No Does the patient have difficulty concentrating, remembering, or making decisions?: Yes Patient able to express need for assistance with ADLs?: Yes Does the patient have difficulty dressing or bathing?: Yes Independently performs ADLs?: No Communication: Independent Dressing (OT): Needs assistance Is this a change from baseline?: Pre-admission baseline Feeding: Independent Bathing: Needs assistance Is this a change from baseline?: Pre-admission baseline Toileting: Needs assistance Is this a change from baseline?: Pre-admission baseline In/Out Bed: Needs assistance Is this a change from baseline?: Pre-admission baseline Walks in Home: Needs assistance Is this a change from baseline?: Pre-admission baseline Does the patient have difficulty walking or climbing stairs?: Yes Weakness of Legs: Both Weakness of Arms/Hands: Both  Permission Sought/Granted Permission sought to share information with : Case Manager, Family Supports Permission granted to share information with : Yes, Release of Information Signed  Share Information with NAME: Shreyansh Clemenson, wife  Permission granted to share info w AGENCY: [facility]        Emotional Assessment   Attitude/Demeanor/Rapport: Engaged Affect (typically observed): Calm Orientation: : Oriented to Self, Oriented to Place, Oriented to  Time, Oriented to Situation   Psych Involvement: No (comment)  Admission diagnosis:  Syncope [R55] Patient Active Problem List   Diagnosis Date Noted   Bilateral pulmonary embolism (  [place]) 12/06/2022   Chronic kidney disease, stage 3a ([place]) 12/06/2022   Postural dizziness with presyncope 12/06/2022   Vitamin B12 deficiency 11/17/2022   Generalized weakness 11/17/2022   Positive blood culture  11/15/2022   Chronic anemia 11/13/2022   AKI (acute kidney injury) ([place]) 07/10/2022   Hyperglycemia 07/09/2022   Iron deficiency anemia due to chronic blood loss    Angiodysplasia of intestinal tract    Severe anemia 12/24/2021   Angina pectoris ([place]) 09/18/2020   Dementia ([place]) 09/18/2020   Renal hematoma, right, subsequent encounter 04/25/2018   Localized osteoporosis of spine 12/23/2017   Constipation 11/06/2016   Bell's palsy 02/26/2015   Nephrogenic adenoma of bladder 123456   Folliculitis 123456   Personal history of methicillin resistant Staphylococcus aureus 02/26/2015   Fatty tumor 02/26/2015   Syncope 02/26/2015   Fungal infection of nail 02/26/2015   H/O malignant neoplasm of prostate 02/26/2015   Altered blood in stool 10/18/2012   Arteriosclerosis of coronary artery 01/05/2009   Severe obstructive sleep apnea 01/05/2009   Hypothyroidism 01/05/2009   Essential hypertension 09/22/1998   PCP:  [name], MD Pharmacy:   [place], [place] - [address] [place] [address] Phone: [phone] Fax: [phone]     Social Determinants of Health (SDOH) Social History: SDOH Screenings   Food Insecurity: No Food Insecurity (12/10/2022)  Housing: Low Risk  (12/10/2022)  Transportation Needs: No Transportation Needs (12/10/2022)  Utilities: Not At Risk (12/07/2022)  Alcohol Screen: Low Risk  (12/23/2021)  Depression (PHQ2-9): Low Risk  (10/13/2022)  Financial Resource Strain: Low Risk  (02/07/2020)  Physical Activity: Sufficiently Active (02/07/2020)  Social Connections: Socially Integrated (02/07/2020)  Stress: No Stress Concern Present (02/07/2020)  Tobacco Use: Medium Risk (12/20/2022)   SDOH Interventions:     Readmission Risk Interventions     No data to display

## 2022-12-21 NOTE — Evaluation (Signed)
Occupational Therapy Evaluation Patient Details Name: Roger Cook MRN: [id number] DOB: Jul 29, 1927 Today's Date: 12/21/2022   History of Present Illness Roger Cook is a 89 y.o. male with medical history significant of HTN, dementia, HLD, hypothyroidism, TIA, prostate cancer hospitalized just 10 days ago diagnosed with bilateral PE without DVT and sent home on Eliquis.  Patient was doing well up until earlier today when he was working with therapy and walked up and down his driveway.  After sitting down on the couch all of a sudden he had loss of consciousness without any trauma.  EMS was called and patient gained his consciousness.  During this time the therapy team and spouse was present and did not notice any seizure type of episode.  Patient denied any chest pain or other complaints.  In the ED vital signs, CT head, chest x-ray and lab work are unremarkable   Clinical Impression   Patient seen for OT evaluation. Received in bed with PT and daughter present. Pt presenting with decreased independence in self care, balance, functional mobility/transfers, endurance, and safety awareness. Pt with cognitive deficits at baseline. Daughter provided PLOF/home set up information. PTA pt lived with spouse, received assistance for all ADLs/IADLs, and used a RW for functional mobility. Pt has 24/7 supervision available with [facility] aide and PCA coming several times a week. Pt currently functioning at supervision for bed mobility, Min guard for simulated toilet transfer, and Min guard for functional mobility at room level using RW. Pt with episode of urinary incontinence upon standing (which is baseline) and required Max A for LB dressing/bathing. Pt will benefit from skilled acute OT services to address deficits noted below. OT recommends ongoing therapy upon discharge to maximize safety and independence with ADLs, decrease fall risk, decrease caregiver burden, and promote return to PLOF.      Recommendations for  follow up therapy are one component of a multi-disciplinary discharge planning process, led by the attending physician.  Recommendations may be updated based on patient status, additional functional criteria and insurance authorization.   Assistance Recommended at Discharge Frequent or constant Supervision/Assistance  Patient can return home with the following A little help with walking and/or transfers;A lot of help with bathing/dressing/bathroom;Assistance with cooking/housework;Assist for transportation;Help with stairs or ramp for entrance;Direct supervision/assist for financial management;Direct supervision/assist for medications management    Functional Status Assessment  Patient has had a recent decline in their functional status and demonstrates the ability to make significant improvements in function in a reasonable and predictable amount of time.  Equipment Recommendations  None recommended by OT    Recommendations for Other Services       Precautions / Restrictions Precautions Precautions: Fall Precaution Comments: urinary incontinence in standing Restrictions Weight Bearing Restrictions: No      Mobility Bed Mobility Overal bed mobility: Needs Assistance Bed Mobility: Rolling, Supine to Sit Rolling: Supervision   Supine to sit: Supervision          Transfers Overall transfer level: Needs assistance Equipment used: Rolling walker (2 wheels) Transfers: Sit to/from Stand, Bed to chair/wheelchair/BSC Sit to Stand: Min guard     Step pivot transfers: Min guard     General transfer comment: urinary incontinence upon standing      Balance Overall balance assessment: Needs assistance Sitting-balance support: Feet supported Sitting balance-Leahy Scale: Good     Standing balance support: Bilateral upper extremity supported, During functional activity Standing balance-Leahy Scale: Good         ADL either performed or  assessed with clinical judgement   ADL  Overall ADL's : Needs assistance/impaired     Grooming: Set up;Supervision/safety;Sitting       Lower Body Bathing: Maximal assistance;Sitting/lateral leans Lower Body Bathing Details (indicate cue type and reason): after episode of urinary incontinence     Lower Body Dressing: Maximal assistance;Sitting/lateral leans Lower Body Dressing Details (indicate cue type and reason): socks Toilet Transfer: Min guard;Rolling walker (2 wheels) Toilet Transfer Details (indicate cue type and reason): simulated Toileting- Clothing Manipulation and Hygiene: Minimal assistance;Sit to/from stand Toileting - Clothing Manipulation Details (indicate cue type and reason): anticipate     Functional mobility during ADLs: Min guard;Rolling walker (2 wheels) (~22ft into hallway, chair follow for safety due to recent syncopal episode)       Vision Baseline Vision/History: 1 Wears glasses Patient Visual Report: No change from baseline       Perception     Praxis      Pertinent Vitals/Pain Pain Assessment Pain Assessment: No/denies pain     Hand Dominance     Extremity/Trunk Assessment Upper Extremity Assessment Upper Extremity Assessment: Generalized weakness   Lower Extremity Assessment Lower Extremity Assessment: Generalized weakness       Communication Communication Communication: No difficulties   Cognition Arousal/Alertness: Awake/alert Behavior During Therapy: WFL for tasks assessed/performed Overall Cognitive Status: History of cognitive impairments - at baseline           General Comments: Pt with h/o dementia at baseline. Daughter present to confirm PLOF/home set up. Very pleasant and cooperative. Followed commands well.     General Comments       Exercises Other Exercises Other Exercises: OT provided education re: role of OT, OT POC, post acute recs, sitting up for all meals, EOB/OOB mobility with assistance, home/fall safety.    Shoulder Instructions      Home  Living Family/patient expects to be discharged to:: Private residence Living Arrangements: Spouse/significant other Available Help at Discharge: Family;Available 24 hours/day;Personal care attendant Type of Home: House Home Access: Stairs to enter [employer] of Steps: 4 Entrance Stairs-Rails: Right;Left Home Layout: Two level     Bathroom Shower/Tub: Teacher, early years/pre: Standard     Home Equipment: Conservation officer, nature (2 wheels);BSC/3in1;Shower seat;Hospital bed   Additional Comments: Pt poor historian. Daughter present for evaluation and confirming PLOF/home set up. Pt lives on the main floor where his hospital bed and [facility] is placed. 1 daughter and son live in [place]. Other daughter lives out of state.      Prior Functioning/Environment Prior Level of Function : Needs assist       Physical Assist : Mobility (physical);ADLs (physical)   ADLs (physical): IADLs;Bathing;Dressing;Toileting Mobility Comments: Pt's daughter present and reported: Pt ambualtes at household level with one person using RW, does not drive ADLs Comments: [place] aide 2x/wk for ~4 hours, other hired caregiver comes 1-2x/wk to assist with bathing (due to pt only wanting male to assist with bathing). Receives assistance with all ADLs/IADLs. BSC for toileting. Urinary incontinence at baseline (wears depends).        OT Problem List: Decreased strength;Decreased activity tolerance;Impaired balance (sitting and/or standing);Decreased cognition;Decreased safety awareness      OT Treatment/Interventions: Self-care/ADL training;Therapeutic exercise;Neuromuscular education;Energy conservation;DME and/or AE instruction;Manual therapy;Modalities;Balance training;Patient/family education;Visual/perceptual remediation/compensation;Cognitive remediation/compensation;Therapeutic activities;Splinting    OT Goals(Current goals can be found in the care plan section) Acute Rehab OT Goals Patient Stated  Goal: return home OT Goal Formulation: With patient/family Time For Goal Achievement: 01/04/23 Potential to Achieve  Goals: Good   OT Frequency: Min 2X/week    Co-evaluation PT/OT/SLP Co-Evaluation/Treatment: Yes   PT goals addressed during session: Mobility/safety with mobility;Balance OT goals addressed during session: ADL's and self-care      AM-PAC OT "6 Clicks" Daily Activity     Outcome Measure Help from another person eating meals?: None Help from another person taking care of personal grooming?: A Little Help from another person toileting, which includes using toliet, bedpan, or urinal?: A Lot Help from another person bathing (including washing, rinsing, drying)?: A Lot Help from another person to put on and taking off regular upper body clothing?: A Little Help from another person to put on and taking off regular lower body clothing?: A Lot 6 Click Score: 16   End of Session Equipment Utilized During Treatment: Gait belt;Rolling walker (2 wheels) Nurse Communication: Mobility status  Activity Tolerance: Patient tolerated treatment well Patient left: in chair;with call bell/phone within reach;with chair alarm set;with family/visitor present  OT Visit Diagnosis: Other abnormalities of gait and mobility (R26.89);Muscle weakness (generalized) (M62.81)                Time: [id number] OT Time Calculation (min): 26 min Charges:  OT General Charges $OT Visit: 1 Visit OT Evaluation $OT Eval Low Complexity: 1 Low  [facility] MS, OTR/L ascom [phone]  12/21/22, 12:58 PM

## 2022-12-21 NOTE — Discharge Summary (Signed)
Physician Discharge Summary  GUADALUPE NUSE [id number] DOB: 1927-08-23 DOA: 12/19/2022  PCP: [name], MD  Admit date: 12/19/2022 Discharge date: 12/21/2022  Admitted From: Home Disposition:  Home w/ HH  Recommendations for Outpatient Follow-up:  Follow up with PCP in 1-2 weeks Please obtain CBC with PCP in the next 3 days Continue home previous medications Discussed with Dr. Clayborn Bigness from cardiology who will help arrange for outpatient Zio patch  Discharge Condition: Stable CODE STATUS: Full code Diet recommendation: Heart healthy  Brief/Interim Summary: 89 y.o. male with medical history significant of HTN, dementia, HLD, hypothyroidism, TIA, prostate cancer hospitalized just 10 days ago diagnosed with bilateral PE without DVT and sent home on Eliquis.  Patient admitted for syncopal episode.  CTA chest showed improved clot burden.  Following days hemoglobin was 6.9 without any obvious evidence of gross bleeding.  1 Units PRBC transfusion ordered.  Following day hemoglobin remained stable, worked with physical therapy and did well who recommended home health.  Arrangements were made. Discussed with patient's daughter.  I also messaged Dr. Clayborn Bigness from [facility] cardiology who will arrange for Zio patch     Assessment & Plan:  Principal Problem:   Syncope Active Problems:   Chronic anemia   Bilateral pulmonary embolism (HCC)   Arteriosclerosis of coronary artery   Essential hypertension   Dementia (HCC)   Chronic kidney disease, stage 3a ([place])    Syncope/loss of consciousness improved - No head trauma.  CT head is negative.  Now back to baseline.  Slowly resume his antihypertensive.  TSH normal, UA p negative.  Gentle hydration as needed.  Will check orthostatics.  May benefit from [facility] cardiology notified.  Their service will arrange for Zio patch   Bilateral pulmonary embolism without cor pulmonale - Resume home Eliquis Repeat CTA this admission shows improvement  in clot burden   Prior history of GI bleed, chronic anemia - History of AVMs on colonoscopy in 2023, EGD February 2024-unremarkable.  Right side renal hematoma.  After 1 unit PRBC transfusion, hemoglobin stable at 9.7 Unfortunately due to recent pulmonary embolism and still current evidence on CTA, unable to discontinue Eliquis.  Bleeding appears to be slow and not currently life-threatening.  He will need to follow-up outpatient with hematology and gastroenterology   CKD stage IIIa -Creatinine around baseline of 1.5 > 1.26   Dementia -Namenda, Aricept   Hypothyroidism -Synthroid   Essential hypertension - Home p.o. meds    Obstructive sleep apnea - CPAP, refusing    Discharge Diagnoses:  Principal Problem:   Syncope Active Problems:   Chronic anemia   Bilateral pulmonary embolism (HCC)   Arteriosclerosis of coronary artery   Essential hypertension   Dementia ([place])   Chronic kidney disease, stage 3a ([place])      Consultations: Discussed with [facility] cardiology, Dr. Call would  Subjective: Seen and examined at bedside, worked with physical therapy and he was sitting up in the chair.  No complaints. Also called his wife and discussed the case.  Discharge Exam: Vitals:   12/21/22 0504 12/21/22 0747  BP: (!) 172/62 (!) 152/62  Pulse: 60 65  Resp: 16 18  Temp: 97.8 F (36.6 C) 98.4 F (36.9 C)  SpO2: 99% 98%   Vitals:   12/20/22 1555 12/20/22 2009 12/21/22 0504 12/21/22 0747  BP: (!) 166/45 (!) 163/49 (!) 172/62 (!) 152/62  Pulse: 68 68 60 65  Resp: 16 16 16 18   Temp: 98.7 F (37.1 C) 98.4 F (36.9  C) 97.8 F (36.6 C) 98.4 F (36.9 C)  TempSrc: Oral Oral Oral Oral  SpO2: 99% 97% 99% 98%  Weight:      Height:        General: Pt is alert, awake, not in acute distress Cardiovascular: RRR, S1/S2 +, no rubs, no gallops Respiratory: CTA bilaterally, no wheezing, no rhonchi Abdominal: Soft, NT, ND, bowel sounds + Extremities: no edema, no cyanosis  Discharge  Instructions   Allergies as of 12/21/2022       Reactions   Levofloxacin Swelling   lips swelling   Povidone Iodine Other (See Comments)   Unknown, can not remember   Sulfa Antibiotics    Unknown, can not remember   Benadryl [diphenhydramine] Rash        Medication List     TAKE these medications    acetaminophen 325 MG tablet Commonly known as: TYLENOL Take 2 tablets (650 mg total) by mouth every 6 (six) hours as needed for mild pain (or Fever >/= 101).   apixaban 5 MG Tabs tablet Commonly known as: ELIQUIS Take 1 tablet (5 mg total) by mouth 2 (two) times daily. What changed: Another medication with the same name was removed. Continue taking this medication, and follow the directions you see here.   ARTIFICIAL TEAR SOLUTION OP Place 1 drop into both eyes 2 (two) times daily as needed (dry eyes).   brimonidine 0.2 % ophthalmic solution Commonly known as: ALPHAGAN Place 1 drop into the left eye 2 (two) times daily.   CALCIUM 1200 PO Take 1,200 mg by mouth daily.   cyanocobalamin 1000 MCG tablet Commonly known as: VITAMIN B12 Take 1 tablet (1,000 mcg total) by mouth daily.   donepezil 10 MG tablet Commonly known as: ARICEPT TAKE 1 TABLET BY MOUTH AT BEDTIME   ferrous sulfate 325 (65 FE) MG tablet Take 1 tablet (325 mg total) by mouth 2 (two) times daily with a meal.   fluticasone 50 MCG/ACT nasal spray Commonly known as: FLONASE Place 2 sprays into both nostrils daily as needed for allergies.   isosorbide mononitrate 60 MG 24 hr tablet Commonly known as: IMDUR TAKE ONE TABLET EVERY DAY   ketoconazole 2 % cream Commonly known as: NIZORAL APPLY TO GLANS OF PENIS DAILY   levothyroxine 88 MCG tablet Commonly known as: SYNTHROID TAKE 1 TABLET EVERY DAY ON EMPTY STOMACHWITH A GLASS OF WATER AT LEAST 30-60 MINBEFORE BREAKFAST   memantine 10 MG tablet Commonly known as: NAMENDA TAKE 1 TABLET BY MOUTH TWO TIMES DAILY   metoprolol succinate 25 MG 24 hr  tablet Commonly known as: TOPROL-XL Take 12.5 mg by mouth daily.   MULTIPLE VITAMIN PO Take 1 tablet by mouth daily.   NON FORMULARY CPAP (Free Text) - Historical Medication  As directed  Started 22-Sep-1994 Active   pravastatin 40 MG tablet Commonly known as: PRAVACHOL TAKE 1 TABLET BY MOUTH DAILY   Prevagen 10 MG Caps Generic drug: Apoaequorin Take 10 mg by mouth daily.   PreviDent 5000 Dry Mouth 1.1 % Gel dental gel Generic drug: sodium fluoride Place 1 application  onto teeth 2 (two) times daily.        Follow-up Information     [name], MD Follow up in 1 week(s).   Specialty: Family Medicine Contact information: [address] [place] [address] [phone]         Yolonda Kida, MD. Call.   Specialties: Cardiology, Internal Medicine Why: Call the office for [facility]  Patch. Dr Clayborn Bigness was notified in the hospital with the request. Contact information: [place] [address] [phone]                Allergies  Allergen Reactions   Levofloxacin Swelling    lips swelling   Povidone Iodine Other (See Comments)    Unknown, can not remember   Sulfa Antibiotics     Unknown, can not remember   Benadryl [Diphenhydramine] Rash    You were cared for by a hospitalist during your hospital stay. If you have any questions about your discharge medications or the care you received while you were in the hospital after you are discharged, you can call the unit and asked to speak with the hospitalist on call if the hospitalist that took care of you is not available. Once you are discharged, your primary care physician will handle any further medical issues. Please note that no refills for any discharge medications will be authorized once you are discharged, as it is imperative that you return to your primary care physician (or establish a relationship with a primary care physician if you do not have one) for your  aftercare needs so that they can reassess your need for medications and monitor your lab values.  You were cared for by a hospitalist during your hospital stay. If you have any questions about your discharge medications or the care you received while you were in the hospital after you are discharged, you can call the unit and asked to speak with the hospitalist on call if the hospitalist that took care of you is not available. Once you are discharged, your primary care physician will handle any further medical issues. Please note that NO REFILLS for any discharge medications will be authorized once you are discharged, as it is imperative that you return to your primary care physician (or establish a relationship with a primary care physician if you do not have one) for your aftercare needs so that they can reassess your need for medications and monitor your lab values.  Please request your Prim.MD to go over all Hospital Tests and Procedure/Radiological results at the follow up, please get all Hospital records sent to your Prim MD by signing hospital release before you go home.  Get CBC, CMP, 2 view Chest X ray checked  by Primary MD during your next visit or SNF MD in 5-7 days ( we routinely change or add medications that can affect your baseline labs and fluid status, therefore we recommend that you get the mentioned basic workup next visit with your PCP, your PCP may decide not to get them or add new tests based on their clinical decision)  On your next visit with your primary care physician please Get Medicines reviewed and adjusted.  If you experience worsening of your admission symptoms, develop shortness of breath, life threatening emergency, suicidal or homicidal thoughts you must seek medical attention immediately by calling 911 or calling your MD immediately  if symptoms less severe.  You Must read complete instructions/literature along with all the possible adverse reactions/side effects for  all the Medicines you take and that have been prescribed to you. Take any new Medicines after you have completely understood and accpet all the possible adverse reactions/side effects.   Do not drive, operate heavy machinery, perform activities at heights, swimming or participation in water activities or provide baby sitting services if your were admitted for syncope or siezures until you have seen by Primary  MD or a Neurologist and advised to do so again.  Do not drive when taking Pain medications.   Procedures/Studies: CT Angio Chest PE W and/or Wo Contrast  Result Date: 12/20/2022 CLINICAL DATA:  Pulmonary embolism suspected EXAM: CT ANGIOGRAPHY CHEST WITH CONTRAST TECHNIQUE: Multidetector CT imaging of the chest was performed using the standard protocol during bolus administration of intravenous contrast. Multiplanar CT image reconstructions and MIPs were obtained to evaluate the vascular anatomy. RADIATION DOSE REDUCTION: This exam was performed according to the departmental dose-optimization program which includes automated exposure control, adjustment of the mA and/or kV according to patient size and/or use of iterative reconstruction technique. CONTRAST:  78mL OMNIPAQUE IOHEXOL 350 MG/ML SOLN COMPARISON:  12/06/2022 FINDINGS: Cardiovascular: Known recent pulmonary emboli. Clot burden is definitely improved, clot in the lower lobe bronchi is no longer occlusive. Lingular clot is also notably diminished and right middle lobe clot is no longer seen. No right heart dilatation. Atherosclerotic calcification of the aorta and coronaries. Mediastinum/Nodes: No adenopathy or mass. Lungs/Pleura: Mild dependent atelectasis. There is no edema, consolidation, effusion, or pneumothorax. Upper Abdomen: No acute finding Musculoskeletal: Chronic T3 compression fracture. T2-3 a T3-4 facet ankylosis Review of the MIP images confirms the above findings. IMPRESSION: Recent pulmonary emboli with significantly improved  clot burden since 12/06/2022. Nonocclusive clot is still seen in the lower lobes and lingula. No new abnormality. Electronically Signed   By: Jorje Guild M.D.   On: 12/20/2022 07:44   DG Chest Portable 1 View  Result Date: 12/19/2022 CLINICAL DATA:  Provided history: Syncope. EXAM: PORTABLE CHEST 1 VIEW COMPARISON:  Prior chest radiographs 12/06/2022 and earlier. FINDINGS: Heart size within normal limits. Aortic atherosclerosis. Small ill-defined opacity within the right lung base, decreased in size from the prior chest radiographs of 12/06/2022. Elsewhere, there is no appreciable airspace consolidation. No evidence of pleural effusion or pneumothorax. No acute osseous abnormality identified. IMPRESSION: 1. Small ill-defined opacity within the right lung base, decreased in size from the prior chest radiographs of 12/06/2022. Correlating with the prior chest CT of 12/06/2022, this likely reflects atelectasis and/or scarring. 2. Aortic Atherosclerosis (ICD10-I70.0). Electronically Signed   By: Kellie Simmering D.O.   On: 12/19/2022 16:24   CT HEAD WO CONTRAST (5MM)  Result Date: 12/19/2022 CLINICAL DATA:  Mental status change EXAM: CT HEAD WITHOUT CONTRAST TECHNIQUE: Contiguous axial images were obtained from the base of the skull through the vertex without intravenous contrast. RADIATION DOSE REDUCTION: This exam was performed according to the departmental dose-optimization program which includes automated exposure control, adjustment of the mA and/or kV according to patient size and/or use of iterative reconstruction technique. COMPARISON:  12/06/2022 FINDINGS: Brain: No acute territorial infarction, hemorrhage or intracranial mass. Atrophy and chronic small vessel ischemic changes of the white matter. Stable ventricle size. Remote lacunar infarct left basal ganglia Vascular: No hyperdense vessels.  Carotid vascular calcification Skull: Normal. Negative for fracture or focal lesion. Sinuses/Orbits:  Postsurgical changes of the sinuses. Moderate mucosal thickening left maxillary sinus and opacified frontal sinuses. Other: None IMPRESSION: 1. No CT evidence for acute intracranial abnormality. 2. Atrophy and chronic small vessel ischemic changes of the white matter. Electronically Signed   By: Donavan Foil M.D.   On: 12/19/2022 16:17   US Venous Img Lower Bilateral (DVT)  Result Date: 12/08/2022 CLINICAL DATA:  [id number] Pulmonary embolism without acute cor pulmonale (HCC) [id number] EXAM: BILATERAL LOWER EXTREMITY VENOUS DOPPLER ULTRASOUND TECHNIQUE: Gray-scale sonography with compression, as well as color and duplex ultrasound, were performed to  evaluate the deep venous system(s) from the level of the common femoral vein through the popliteal and proximal calf veins. COMPARISON:  None Available. FINDINGS: VENOUS Normal compressibility of the common femoral, superficial femoral, and popliteal veins, as well as the visualized calf veins. Visualized portions of profunda femoral vein and great saphenous vein unremarkable. No filling defects to suggest DVT on grayscale or color Doppler imaging. Doppler waveforms show normal direction of venous flow, normal respiratory plasticity and response to augmentation. OTHER None. Limitations: none IMPRESSION: Negative. Electronically Signed   By: Fidela Salisbury M.D.   On: 12/08/2022 20:57   ECHOCARDIOGRAM COMPLETE  Result Date: 12/08/2022    ECHOCARDIOGRAM REPORT   Patient Name:   KAHSEEM ANASTASIA Chrestman Date of Exam: 12/08/2022 Medical Rec #:  [id number]      Height:       67.0 in Accession #:    [id number]     Weight:       150.0 lb Date of Birth:  March 03, 1927      BSA:          1.790 m Patient Age:    61 years       BP:           162/45 mmHg Patient Gender: M              HR:           72 bpm. Exam Location:  [facility] Procedure: 2D Echo, Cardiac Doppler and Color Doppler Indications:     Pulmonary Embolus  History:         Patient has no prior history of Echocardiogram examinations.                   Angina, Signs/Symptoms:Syncope; Risk Factors:Hypertension and                  Sleep Apnea.  Sonographer:     Wenda Low Referring Phys:  [id number] PRINCE T Rande Brunt Diagnosing Phys: Kathlyn Sacramento MD IMPRESSIONS  1. Left ventricular ejection fraction, by estimation, is 60 to 65%. The left ventricle has normal function. The left ventricle has no regional wall motion abnormalities. There is mild left ventricular hypertrophy. Left ventricular diastolic parameters are consistent with Grade I diastolic dysfunction (impaired relaxation).  2. Right ventricular systolic function is normal. The right ventricular size is normal. Tricuspid regurgitation signal is inadequate for assessing PA pressure.  3. The mitral valve is normal in structure. No evidence of mitral valve regurgitation. No evidence of mitral stenosis.  4. The aortic valve is normal in structure. Aortic valve regurgitation is trivial. Aortic valve sclerosis/calcification is present, without any evidence of aortic stenosis.  5. The inferior vena cava is normal in size with greater than 50% respiratory variability, suggesting right atrial pressure of 3 mmHg. FINDINGS  Left Ventricle: Left ventricular ejection fraction, by estimation, is 60 to 65%. The left ventricle has normal function. The left ventricle has no regional wall motion abnormalities. The left ventricular internal cavity size was normal in size. There is  mild left ventricular hypertrophy. Left ventricular diastolic parameters are consistent with Grade I diastolic dysfunction (impaired relaxation). Right Ventricle: The right ventricular size is normal. No increase in right ventricular wall thickness. Right ventricular systolic function is normal. Tricuspid regurgitation signal is inadequate for assessing PA pressure. Left Atrium: Left atrial size was normal in size. Right Atrium: Right atrial size was normal in size. Pericardium: There is no evidence of pericardial effusion. Mitral  Valve: The mitral  valve is normal in structure. No evidence of mitral valve regurgitation. No evidence of mitral valve stenosis. MV peak gradient, 5.6 mmHg. The mean mitral valve gradient is 2.0 mmHg. Tricuspid Valve: The tricuspid valve is normal in structure. Tricuspid valve regurgitation is not demonstrated. No evidence of tricuspid stenosis. Aortic Valve: The aortic valve is normal in structure. Aortic valve regurgitation is trivial. Aortic valve sclerosis/calcification is present, without any evidence of aortic stenosis. Aortic valve mean gradient measures 4.0 mmHg. Aortic valve peak gradient measures 8.2 mmHg. Aortic valve area, by VTI measures 2.24 cm. Pulmonic Valve: The pulmonic valve was normal in structure. Pulmonic valve regurgitation is not visualized. No evidence of pulmonic stenosis. Aorta: The aortic root is normal in size and structure. Venous: The inferior vena cava is normal in size with greater than 50% respiratory variability, suggesting right atrial pressure of 3 mmHg. IAS/Shunts: No atrial level shunt detected by color flow Doppler.  LEFT VENTRICLE PLAX 2D LVIDd:         4.60 cm   Diastology LVIDs:         3.00 cm   LV e' medial:    5.22 cm/s LV PW:         0.90 cm   LV E/e' medial:  10.0 LV IVS:        1.20 cm   LV e' lateral:   9.57 cm/s LVOT diam:     2.00 cm   LV E/e' lateral: 5.5 LV SV:         74 LV SV Index:   42 LVOT Area:     3.14 cm  RIGHT VENTRICLE RV Basal diam:  3.05 cm RV Mid diam:    2.70 cm RV S prime:     17.25 cm/s TAPSE (M-mode): 2.6 cm LEFT ATRIUM             Index        RIGHT ATRIUM           Index LA diam:        3.80 cm 2.12 cm/m   RA Area:     10.90 cm LA Vol (A2C):   53.8 ml 30.06 ml/m  RA Volume:   21.30 ml  11.90 ml/m LA Vol (A4C):   30.6 ml 17.10 ml/m LA Biplane Vol: 41.3 ml 23.08 ml/m  AORTIC VALVE                    PULMONIC VALVE AV Area (Vmax):    2.07 cm     PV Vmax:       0.95 m/s AV Area (Vmean):   2.01 cm     PV Peak grad:  3.6 mmHg AV Area (VTI):      2.24 cm AV Vmax:           143.00 cm/s AV Vmean:          95.700 cm/s AV VTI:            0.332 m AV Peak Grad:      8.2 mmHg AV Mean Grad:      4.0 mmHg LVOT Vmax:         94.20 cm/s LVOT Vmean:        61.300 cm/s LVOT VTI:          0.237 m LVOT/AV VTI ratio: 0.71  AORTA Ao Root diam: 3.20 cm MITRAL VALVE MV Area (PHT): 2.69 cm     SHUNTS MV Area VTI:   2.68  cm     Systemic VTI:  0.24 m MV Peak grad:  5.6 mmHg     Systemic Diam: 2.00 cm MV Mean grad:  2.0 mmHg MV Vmax:       1.18 m/s MV Vmean:      54.4 cm/s MV Decel Time: 282 msec MV E velocity: 52.40 cm/s MV A velocity: 114.00 cm/s MV E/A ratio:  0.46 Kathlyn Sacramento MD Electronically signed by Kathlyn Sacramento MD Signature Date/Time: 12/08/2022/11:54:39 AM    Final    CT Angio Chest PE W and/or Wo Contrast  Result Date: 12/06/2022 CLINICAL DATA:  Near syncope. EXAM: CT ANGIOGRAPHY CHEST WITH CONTRAST TECHNIQUE: Multidetector CT imaging of the chest was performed using the standard protocol during bolus administration of intravenous contrast. Multiplanar CT image reconstructions and MIPs were obtained to evaluate the vascular anatomy. RADIATION DOSE REDUCTION: This exam was performed according to the departmental dose-optimization program which includes automated exposure control, adjustment of the mA and/or kV according to patient size and/or use of iterative reconstruction technique. CONTRAST:  38mL OMNIPAQUE IOHEXOL 350 MG/ML SOLN COMPARISON:  None Available. FINDINGS: Cardiovascular: There is marked severity calcification of the aortic arch and descending thoracic aorta, without evidence of aortic aneurysm. Satisfactory opacification of the pulmonary arteries to the segmental level. Marked severity areas of intraluminal low attenuation are seen involving multiple bilateral upper lobe, right middle lobe and bilateral lower lobe branches of the bilateral pulmonary arteries. No saddle embolus is identified. Normal heart size with marked severity coronary  artery calcification and right heart strain (RV/LV ratio of 1.03). No pericardial effusion. Mediastinum/Nodes: No enlarged mediastinal, hilar, or axillary lymph nodes. The thyroid gland is not clearly identified. The trachea and esophagus demonstrate no significant findings. Lungs/Pleura: Mild atelectatic changes are seen within the posterior aspects of the bilateral lower lobes. Focal linear scarring and/or atelectasis is noted within the posterolateral aspect of the right lung base. There is no evidence of an acute infiltrate, pleural effusion or pneumothorax. Upper Abdomen: A 2.2 cm diameter simple cyst is seen along the anterior aspect of the upper pole of the right kidney. Musculoskeletal: A chronic compression fracture deformity is seen involving the T4 vertebral body. Review of the MIP images confirms the above findings. IMPRESSION: 1. Marked severity bilateral pulmonary embolism with CT evidence of right heart strain (RV/LV ratio of 1.03). 2. Mild bilateral lower lobe atelectasis. 3. Chronic compression fracture deformity of the T4 vertebral body. 4. Aortic atherosclerosis. Aortic Atherosclerosis (ICD10-I70.0). Electronically Signed   By: Virgina Norfolk M.D.   On: 12/06/2022 22:16   CT HEAD WO CONTRAST (5MM)  Result Date: 12/06/2022 CLINICAL DATA:  Syncope/presyncope, cerebrovascular cause suspected. Dizziness EXAM: CT HEAD WITHOUT CONTRAST TECHNIQUE: Contiguous axial images were obtained from the base of the skull through the vertex without intravenous contrast. RADIATION DOSE REDUCTION: This exam was performed according to the departmental dose-optimization program which includes automated exposure control, adjustment of the mA and/or kV according to patient size and/or use of iterative reconstruction technique. COMPARISON:  11/13/2022 FINDINGS: Brain: Normal anatomic configuration. Moderate diffuse parenchymal volume loss is commensurate with the patient's age. Stable moderate periventricular white  matter changes are present likely reflecting the sequela of small vessel ischemia. Stable remote lacunar infarct within the left basal ganglia. No abnormal intra or extra-axial mass lesion or fluid collection. No abnormal mass effect or midline shift. No evidence of acute intracranial hemorrhage or infarct. Ventricular size is commensurate with the degree of parenchymal volume loss. Cerebellum unremarkable. Vascular: No asymmetric  hyperdense vasculature at the skull base. Skull: Intact Sinuses/Orbits: There is complete opacification of the a frontal sinuses bilaterally, unchanged from prior examination, in keeping with changes of chronic sinusitis. Surgical changes of bilateral ethmoidectomy, maxillary antrectomy, and middle turbinectomy are identified. Moderate mucosal thickening within the left maxillary sinus without air-fluid level. The orbits are unremarkable. Other: Mastoid air cells and middle ear cavities are clear. IMPRESSION: 1. No acute intracranial abnormality. No calvarial fracture. 2. Stable senescent changes and remote lacunar infarct within the left basal ganglia. 3. Stable paranasal sinus disease. Electronically Signed   By: Fidela Salisbury M.D.   On: 12/06/2022 19:02   DG Chest 2 View  Result Date: 12/06/2022 CLINICAL DATA:  Near syncope . EXAM: CHEST - 2 VIEW COMPARISON:  11/13/2022 FINDINGS: The lungs are clear without focal pneumonia, edema, pneumothorax or pleural effusion. Interstitial markings are diffusely coarsened with chronic features. Patchy airspace disease in the suprahilar right lung and right base is similar to prior. The cardiopericardial silhouette is within normal limits for size. Telemetry leads overlie the chest. IMPRESSION: Chronic interstitial changes with patchy airspace disease in the suprahilar right lung and right base, potentially scarring although infiltrate not excluded. Electronically Signed   By: Misty Stanley M.D.   On: 12/06/2022 18:46     The results of  significant diagnostics from this hospitalization (including imaging, microbiology, ancillary and laboratory) are listed below for reference.     Microbiology: No results found for this or any previous visit (from the past 240 hour(s)).   Labs: BNP (last 3 results) No results for input(s): "BNP" in the last 8760 hours. Basic Metabolic Panel: Recent Labs  Lab 12/19/22 1443 12/20/22 0332 12/21/22 0334  NA 137 139 138  K 4.4 3.9 3.8  CL 105 108 110  CO2 20* 20* 21*  GLUCOSE 134* 95 117*  BUN 26* 23 25*  CREATININE 1.56* 1.34* 1.26*  CALCIUM 9.0 8.5* 8.5*  MG  --  2.4 2.6*   Liver Function Tests: No results for input(s): "AST", "ALT", "ALKPHOS", "BILITOT", "PROT", "ALBUMIN" in the last 168 hours. No results for input(s): "LIPASE", "AMYLASE" in the last 168 hours. No results for input(s): "AMMONIA" in the last 168 hours. CBC: Recent Labs  Lab 12/19/22 1443 12/20/22 0332 12/20/22 1844 12/21/22 0334  WBC 7.4 6.1  --  6.9  HGB 8.0* 6.9* 10.2* 9.7*  HCT 27.0* 23.6*  --  30.6*  MCV 86.8 86.1  --  85.0  PLT 315 243  --  255   Cardiac Enzymes: No results for input(s): "CKTOTAL", "CKMB", "CKMBINDEX", "TROPONINI" in the last 168 hours. BNP: Invalid input(s): "POCBNP" CBG: No results for input(s): "GLUCAP" in the last 168 hours. D-Dimer No results for input(s): "DDIMER" in the last 72 hours. Hgb A1c No results for input(s): "HGBA1C" in the last 72 hours. Lipid Profile No results for input(s): "CHOL", "HDL", "LDLCALC", "TRIG", "CHOLHDL", "LDLDIRECT" in the last 72 hours. Thyroid function studies Recent Labs    12/19/22 1913  TSH 2.639   Anemia work up No results for input(s): "VITAMINB12", "FOLATE", "FERRITIN", "TIBC", "IRON", "RETICCTPCT" in the last 72 hours. Urinalysis    Component Value Date/Time   COLORURINE YELLOW (A) 12/20/2022 1050   APPEARANCEUR CLEAR (A) 12/20/2022 1050   APPEARANCEUR Cloudy (A) 02/28/2021 1454   LABSPEC >1.046 (H) 12/20/2022 1050    LABSPEC 1.018 10/21/2012 1747   PHURINE 5.0 12/20/2022 1050   GLUCOSEU NEGATIVE 12/20/2022 1050   GLUCOSEU Negative 10/21/2012 1747   HGBUR LARGE (A)  12/20/2022 [place] 12/20/2022 1050   BILIRUBINUR Negative 02/28/2021 1454   BILIRUBINUR Negative 10/21/2012 1747   KETONESUR NEGATIVE 12/20/2022 1050   PROTEINUR NEGATIVE 12/20/2022 1050   NITRITE NEGATIVE 12/20/2022 1050   LEUKOCYTESUR NEGATIVE 12/20/2022 1050   LEUKOCYTESUR Negative 10/21/2012 1747   Sepsis Labs Recent Labs  Lab 12/19/22 1443 12/20/22 0332 12/21/22 0334  WBC 7.4 6.1 6.9   Microbiology No results found for this or any previous visit (from the past 240 hour(s)).   Time coordinating discharge:  I have spent 35 minutes face to face with the patient and on the ward discussing the patients care, assessment, plan and disposition with other care givers. >50% of the time was devoted counseling the patient about the risks and benefits of treatment/Discharge disposition and coordinating care.   SIGNED:   Damita Lack, MD  [facility] 12/21/2022, 12:28 PM   If 7PM-7AM, please contact night-coverage

## 2022-12-21 NOTE — Evaluation (Signed)
Physical Therapy Evaluation Patient Details Name: Roger Cook MRN: [id number] DOB: 09/25/1926 Today's Date: 12/21/2022  History of Present Illness  Roger Cook is a 89 y.o. male with medical history significant of HTN, dementia, HLD, hypothyroidism, TIA, prostate cancer hospitalized just 10 days ago diagnosed with bilateral PE without DVT and sent home on Eliquis.  Patient was doing well up until earlier today when he was working with therapy and walked up and down his driveway.  After sitting down on the couch all of a sudden he had loss of consciousness without any trauma.  EMS was called and patient gained his consciousness.  During this time the therapy team and spouse was present and did not notice any seizure type of episode.  Patient denied any chest pain or other complaints.  In the ED vital signs, CT head, chest x-ray and lab work are unremarkable  Clinical Impression  Pt received in bed with daughter by his side agreeable to participated in PT evaluation. Pt's PLOF includes needing assistance with ADLs and IADls.  Ambulated with FWW with one person assist. Uses BSC when the help is done. Pt was receiving HHaide 2 x week x 4 hrs and  HHPT/OT. PT assessment revealed pt is able to perform bed mobility with sup , transfers and ambulates with one person CGA  without symptoms. Daughter reported of Pt's wife unable top provide care for pt when the [facility] is done and so would  like to increase [facility] care hours and days. PT will continue while in acute care and pt will benefit form continued PT beyond Acute care.      Recommendations for follow up therapy are one component of a multi-disciplinary discharge planning process, led by the attending physician.  Recommendations may be updated based on patient status, additional functional criteria and insurance authorization.  Follow Up Recommendations   None      Assistance Recommended at Discharge Frequent or constant Supervision/Assistance  Patient can  return home with the following  A little help with walking and/or transfers;A little help with bathing/dressing/bathroom;Assistance with cooking/housework;Direct supervision/assist for medications management;Assist for transportation;Help with stairs or ramp for entrance    Equipment Recommendations None recommended by PT  Recommendations for Other Services       Functional Status Assessment Patient has had a recent decline in their functional status and demonstrates the ability to make significant improvements in function in a reasonable and predictable amount of time.     Precautions / Restrictions Precautions Precautions: Fall Restrictions Weight Bearing Restrictions: No      Mobility  Bed Mobility Overal bed mobility: Needs Assistance Bed Mobility: Rolling, Supine to Sit Rolling: Supervision   Supine to sit: Supervision     General bed mobility comments: slow but safe.    Transfers Overall transfer level: Needs assistance Equipment used: Rolling walker (2 wheels) Transfers: Sit to/from Stand, Bed to chair/wheelchair/BSC Sit to Stand: Min guard   Step pivot transfers: Min guard            Ambulation/Gait Ambulation/Gait assistance: Min guard Gait Distance (Feet): 55 Feet Assistive device: Rolling walker (2 wheels) Gait Pattern/deviations: Step-through pattern, Decreased stride length Gait velocity: dec        Stairs: N/T            Wheelchair Mobility    Modified Rankin (Stroke Patients Only)       Balance Overall balance assessment: Needs assistance Sitting-balance support: Bilateral upper extremity supported Sitting balance-Leahy Scale: Good Sitting balance -  Comments: no Leans or LOB and asymptomatic   Standing balance support: Bilateral upper extremity supported Standing balance-Leahy Scale: Good                               Pertinent Vitals/Pain Pain Assessment Pain Assessment: No/denies pain    Home Living  Family/patient expects to be discharged to:: Private residence Living Arrangements: Spouse/significant other Available Help at Discharge: Family;Available 24 hours/day;Personal care attendant (twice a week) Type of Home: House Home Access: Stairs to enter Entrance Stairs-Rails: Psychiatric nurse of Steps: 4   Home Layout: Two level Home Equipment: Conservation officer, nature (2 wheels);BSC/3in1;Shower seat;Hospital bed Additional Comments: Pt lives on the main floor where his hospital bed and BSC is placed.    Prior Function Prior Level of Function : Patient poor historian/Family not available;Other (comment)             Mobility Comments: Pt's daughter present and reproted: Pt ambualtes at household level with one person using FWW. Uses BSC. ADLs Comments: Pt needs assitance with bathing, toileting, and IADls andmeds.Wife annd HHAide prepapres  meals and wife provides assist with everything when help is gone.     Hand Dominance        Extremity/Trunk Assessment   Upper Extremity Assessment Upper Extremity Assessment: Defer to OT evaluation    Lower Extremity Assessment Lower Extremity Assessment: Generalized weakness       Communication   Communication: No difficulties  Cognition Arousal/Alertness: Awake/alert Behavior During Therapy: WFL for tasks assessed/performed Overall Cognitive Status: History of cognitive impairments - at baseline                                 General Comments: Pt is pleasnt and cognitively impaired at baselinie.        General Comments      Exercises     Assessment/Plan    PT Assessment Patient needs continued PT services  PT Problem List Decreased strength;Decreased activity tolerance;Decreased safety awareness;Decreased cognition       PT Treatment Interventions Gait training;Stair training;Functional mobility training;Therapeutic activities;Therapeutic exercise;Balance training;Cognitive  remediation;Patient/family education    PT Goals (Current goals can be found in the Care Plan section)  Acute Rehab PT Goals Patient Stated Goal: unable, but daugter stated" To go home with more help and continue HHPT/OT." PT Goal Formulation: With family Time For Goal Achievement: 01/04/23 Potential to Achieve Goals: Good    Frequency Min 2X/week     Co-evaluation PT/OT/SLP Co-Evaluation/Treatment: Yes   PT goals addressed during session: Mobility/safety with mobility;Balance         AM-PAC PT "6 Clicks" Mobility  Outcome Measure Help needed turning from your back to your side while in a flat bed without using bedrails?: None Help needed moving from lying on your back to sitting on the side of a flat bed without using bedrails?: A Little Help needed moving to and from a bed to a chair (including a wheelchair)?: A Little Help needed standing up from a chair using your arms (e.g., wheelchair or bedside chair)?: A Little Help needed to walk in hospital room?: A Little Help needed climbing 3-5 steps with a railing? : A Little 6 Click Score: 19    End of Session Equipment Utilized During Treatment: Gait belt Activity Tolerance: Patient tolerated treatment well Patient left: in chair;with call bell/phone within reach;with chair alarm set;with family/visitor  present Nurse Communication: Mobility status PT Visit Diagnosis: Muscle weakness (generalized) (M62.81);Difficulty in walking, not elsewhere classified (R26.2)    Time: [id number] PT Time Calculation (min) (ACUTE ONLY): 36 min   Charges:   PT Evaluation $PT Eval Low Complexity: 1 Low PT Treatments $Gait Training: 8-22 mins      Aspin Palomarez PT DPT 12:25 PM,12/21/22

## 2022-12-22 ENCOUNTER — Telehealth: Payer: Self-pay | Admitting: *Deleted

## 2022-12-22 NOTE — Transitions of Care (Post Inpatient/ED Visit) (Signed)
   12/22/2022  Name: Roger Cook MRN: [id number] DOB: September 14, 1927  Today's TOC FU Call Status: Today's TOC FU Call Status:: Unsuccessul Call (1st Attempt) Unsuccessful Call (1st Attempt) Date: 12/22/22  Attempted to reach the patient regarding the most recent Inpatient/ED visit.  Follow Up Plan: Additional outreach attempts will be made to reach the patient to complete the Transitions of Care (Post Inpatient/ED visit) call.   [place] [facility] [phone]

## 2022-12-23 ENCOUNTER — Telehealth: Payer: Self-pay | Admitting: *Deleted

## 2022-12-23 NOTE — Transitions of Care (Post Inpatient/ED Visit) (Signed)
   12/23/2022  Name: Roger Cook MRN: [id number] DOB: 20-Jun-1927  Today's TOC FU Call Status: Today's TOC FU Call Status:: Successful TOC FU Call Competed TOC FU Call Complete Date: 12/23/22  Transition Care Management Follow-up Telephone Call Date of Discharge: 12/21/22 Discharge Facility[facility]) Type of Discharge: Inpatient Admission Primary Inpatient Discharge Diagnosis:: Syncope How have you been since you were released from the hospital?: Better Any questions or concerns?: No  Items Reviewed: Did you receive and understand the discharge instructions provided?: Yes (Patient did not know he was to call Dr Clayborn Bigness office reagarding the [place]) Any new allergies since your discharge?: No Dietary orders reviewed?: No Do you have support at home?: Yes People in Home: spouse Name of Support/Comfort Primary Source: [facility] and Equipment/Supplies: [place] Ordered?: NA Any new equipment or medical supplies ordered?: NA Do you have any questions related to the use of the equipment/supplies?: No  Functional Questionnaire: Do you need assistance with bathing/showering or dressing?: Yes Do you need assistance with eating?: No Do you have difficulty maintaining continence: No Do you need assistance with getting out of bed/getting out of a chair/moving?: No Do you have difficulty managing or taking your medications?: Yes  Follow up appointments reviewed: PCP Follow-up appointment confirmed?: Yes Date of PCP follow-up appointment?: 01/05/23 Follow-up Provider: Dr Lelon Huh Specialist [facility] Follow-up appointment confirmed?: No Reason Specialist Follow-Up Not Confirmed: Patient has Specialist Provider Number and will Call for Appointment Do you need transportation to your follow-up appointment?: No Do you understand care options if your condition(s) worsen?: Yes-patient verbalized understanding  SDOH Interventions  Today    Flowsheet Row Most Recent Value  SDOH Interventions   Food Insecurity Interventions Intervention Not Indicated  Housing Interventions Intervention Not Indicated  Transportation Interventions Intervention Not Indicated      Interventions Today    Flowsheet Row Most Recent Value  General Interventions   General Interventions Discussed/Reviewed General Interventions Discussed, General Interventions Reviewed, Doctor Visits  Doctor Visits Discussed/Reviewed Doctor Visits Discussed, Doctor Visits Reviewed      [facility] Interventions Today    Flowsheet Row Most Recent Value  TOC Interventions   TOC Interventions Discussed/Reviewed TOC Interventions Discussed, TOC Interventions Reviewed  [discussed calling Dr Clayborn Bigness for [place] [facility] [phone]

## 2022-12-24 ENCOUNTER — Telehealth: Payer: Self-pay

## 2022-12-24 ENCOUNTER — Other Ambulatory Visit: Payer: Self-pay | Admitting: Family Medicine

## 2022-12-24 DIAGNOSIS — R55 Syncope and collapse: Secondary | ICD-10-CM | POA: Diagnosis not present

## 2022-12-24 NOTE — Telephone Encounter (Signed)
Copied from [place] [id number]. Topic: General - Other >> Dec 24, 2022  1:13 PM Roger Cook wrote: Reason for CRM: Medication Refill - Medication: pravastatin (PRAVACHOL) 40 MG tablet [id number]  Has the patient contacted their pharmacy?  (Agent: If no, request that the patient contact the pharmacy for the refill. If patient does not wish to contact the pharmacy document the reason why and proceed with request.) (Agent: If yes, when and what did the pharmacy advise?)  Preferred Pharmacy (with phone number or street name): *** Has the patient been seen for an appointment in the last year OR does the patient have an upcoming appointment?   Agent: Please be advised that RX refills may take up to 3 business days. We ask that you follow-up with your pharmacy.

## 2022-12-24 NOTE — Telephone Encounter (Signed)
Copied from [place] [id number]. Topic: Quick Communication - Home Health Verbal Orders >> Dec 24, 2022  4:42 PM Everette C wrote: Caller/Agency: [facility]  Callback Number: [phone] Requesting OT/PT/Skilled Nursing/Social Work/Speech Therapy: PT   [facility] would like to request start of care for the patient's PT on 12/26/22

## 2022-12-24 NOTE — Telephone Encounter (Signed)
Requested Prescriptions  Pending Prescriptions Disp Refills   pravastatin (PRAVACHOL) 40 MG tablet 90 tablet 4    Sig: Take 1 tablet (40 mg total) by mouth daily.     Cardiovascular:  Antilipid - Statins Failed - 12/24/2022  3:27 PM      Failed - Lipid Panel in normal range within the last 12 months    Cholesterol, Total  Date Value Ref Range Status  12/23/2021 124 100 - 199 mg/dL Final   LDL Chol Calc (NIH)  Date Value Ref Range Status  12/23/2021 55 0 - 99 mg/dL Final   HDL  Date Value Ref Range Status  12/23/2021 28 (L) >39 mg/dL Final   Triglycerides  Date Value Ref Range Status  12/23/2021 261 (H) 0 - 149 mg/dL Final         Passed - Patient is not pregnant      Passed - Valid encounter within last 12 months    Recent Outpatient Visits           2 months ago Chronic pain of left knee   [facility] [name], MD   5 months ago AKI (acute kidney injury) [facility])   [place] Mikey Kirschner, PA-C   5 months ago [place], Donald E, MD   9 months ago Bradycardia   [facility] [name], MD   11 months ago Iron deficiency anemia due to chronic blood loss   [place], MD       Future Appointments             In 1 week Fisher, Kirstie Peri, MD [facility]

## 2022-12-25 NOTE — Telephone Encounter (Signed)
Requested medication (s) are due for refill today: yes  Requested medication (s) are on the active medication list: yes  Last refill:  12/09/22  Future visit scheduled: yes  Notes to clinic:  Unable to refill per protocol, last refill by another provider not at this practice. Routing for approval.     Requested Prescriptions  Pending Prescriptions Disp Refills   FEROSUL 325 (65 Fe) MG tablet [Pharmacy Med Name: FEROSUL 325 (65 FE) MG TAB] 30 tablet 3    Sig: TAKE ONE TABLET BY MOUTH TWICE DAILY WITH A MEAL     Endocrinology:  Minerals - Iron Supplementation Failed - 12/24/2022  3:50 PM      Failed - HGB in normal range and within 360 days    Hemoglobin  Date Value Ref Range Status  12/21/2022 9.7 (L) 13.0 - 17.0 g/dL Final  03/26/2022 9.9 (L) 13.0 - 17.7 g/dL Final         Failed - HCT in normal range and within 360 days    HCT  Date Value Ref Range Status  12/21/2022 30.6 (L) 39.0 - 52.0 % Final   Hematocrit  Date Value Ref Range Status  03/26/2022 31.4 (L) 37.5 - 51.0 % Final         Failed - RBC in normal range and within 360 days    RBC  Date Value Ref Range Status  12/21/2022 3.60 (L) 4.22 - 5.81 MIL/uL Final         Failed - Ferritin in normal range and within 360 days    Ferritin  Date Value Ref Range Status  11/13/2022 5 (L) 24 - 336 ng/mL Final    Comment:    Performed at [facility], [address] [place], [place] [address]  03/26/2022 12 (L) 30 - 400 ng/mL Final         Passed - Fe (serum) in normal range and within 360 days    Iron  Date Value Ref Range Status  11/13/2022 78 45 - 182 ug/dL Final  03/26/2022 38 38 - 169 ug/dL Final   Iron Saturation  Date Value Ref Range Status  03/26/2022 10 (L) 15 - 55 % Final   Saturation Ratios  Date Value Ref Range Status  11/13/2022 20 17.9 - 39.5 % Final         Passed - Valid encounter within last 12 months    Recent Outpatient Visits           2 months ago Chronic pain of left knee    [facility] [name], MD   5 months ago AKI (acute kidney injury) [facility])   [place] Mikey Kirschner, PA-C   5 months ago [place], Donald E, MD   9 months ago Bradycardia   [facility] [name], MD   11 months ago Iron deficiency anemia due to chronic blood loss   [place], Kirstie Peri, MD       Future Appointments             In 1 week Fisher, Kirstie Peri, MD [facility] [place]

## 2022-12-25 NOTE — Telephone Encounter (Signed)
That's fine

## 2022-12-26 DIAGNOSIS — R55 Syncope and collapse: Secondary | ICD-10-CM | POA: Diagnosis not present

## 2022-12-26 DIAGNOSIS — E039 Hypothyroidism, unspecified: Secondary | ICD-10-CM | POA: Diagnosis not present

## 2022-12-26 DIAGNOSIS — N281 Cyst of kidney, acquired: Secondary | ICD-10-CM | POA: Diagnosis not present

## 2022-12-26 DIAGNOSIS — F0280 Dementia in other diseases classified elsewhere without behavioral disturbance: Secondary | ICD-10-CM | POA: Diagnosis not present

## 2022-12-26 DIAGNOSIS — I2510 Atherosclerotic heart disease of native coronary artery without angina pectoris: Secondary | ICD-10-CM | POA: Diagnosis not present

## 2022-12-26 DIAGNOSIS — N200 Calculus of kidney: Secondary | ICD-10-CM | POA: Diagnosis not present

## 2022-12-26 DIAGNOSIS — Z8546 Personal history of malignant neoplasm of prostate: Secondary | ICD-10-CM | POA: Diagnosis not present

## 2022-12-26 DIAGNOSIS — G510 Bell's palsy: Secondary | ICD-10-CM | POA: Diagnosis not present

## 2022-12-26 DIAGNOSIS — K219 Gastro-esophageal reflux disease without esophagitis: Secondary | ICD-10-CM | POA: Diagnosis not present

## 2022-12-26 DIAGNOSIS — I2699 Other pulmonary embolism without acute cor pulmonale: Secondary | ICD-10-CM | POA: Diagnosis not present

## 2022-12-26 DIAGNOSIS — E785 Hyperlipidemia, unspecified: Secondary | ICD-10-CM | POA: Diagnosis not present

## 2022-12-26 DIAGNOSIS — G4733 Obstructive sleep apnea (adult) (pediatric): Secondary | ICD-10-CM | POA: Diagnosis not present

## 2022-12-26 DIAGNOSIS — F0390 Unspecified dementia without behavioral disturbance: Secondary | ICD-10-CM | POA: Diagnosis not present

## 2022-12-26 DIAGNOSIS — D631 Anemia in chronic kidney disease: Secondary | ICD-10-CM | POA: Diagnosis not present

## 2022-12-26 DIAGNOSIS — Z87891 Personal history of nicotine dependence: Secondary | ICD-10-CM | POA: Diagnosis not present

## 2022-12-26 DIAGNOSIS — Z7901 Long term (current) use of anticoagulants: Secondary | ICD-10-CM | POA: Diagnosis not present

## 2022-12-26 DIAGNOSIS — I129 Hypertensive chronic kidney disease with stage 1 through stage 4 chronic kidney disease, or unspecified chronic kidney disease: Secondary | ICD-10-CM | POA: Diagnosis not present

## 2022-12-26 DIAGNOSIS — N1831 Chronic kidney disease, stage 3a: Secondary | ICD-10-CM | POA: Diagnosis not present

## 2022-12-26 DIAGNOSIS — I700 Atherosclerosis of aorta: Secondary | ICD-10-CM | POA: Diagnosis not present

## 2022-12-29 ENCOUNTER — Telehealth: Payer: Self-pay | Admitting: Family Medicine

## 2022-12-29 NOTE — Telephone Encounter (Signed)
Pt's daughter [name] Alphonsus Sias) is calling in requesting Dr. Theodis Aguas nurse to give her a call because she wants to discuss some things regarding her dad (pt). Per Elease Hashimoto pt has been seen in the hospital numerous times for the same issue and she would like some advice before they come in for pt's appt on 01/05/23.

## 2022-12-29 NOTE — Telephone Encounter (Signed)
Daughter was advised to have someone with her father at his next visit to discuss the questions she has

## 2022-12-30 ENCOUNTER — Telehealth: Payer: Self-pay

## 2022-12-30 NOTE — Telephone Encounter (Signed)
Copied from CRM [id number]. Topic: General - Other >> Dec 30, 2022  1:46 PM Epimenio Foot F wrote: Reason for CRM: Home Health Verbal Orders - Caller/Agency: [facility]  Callback Number: [phone] Requesting OT/PT/Skilled Nursing/Social Work/Speech Therapy: Verbal orders Physical Therapy  Frequency: 1x week for 5 weeks , and then every other week x4

## 2022-12-30 NOTE — Telephone Encounter (Signed)
That's fine

## 2022-12-30 NOTE — Telephone Encounter (Signed)
Advised 

## 2023-01-01 DIAGNOSIS — D631 Anemia in chronic kidney disease: Secondary | ICD-10-CM | POA: Diagnosis not present

## 2023-01-01 DIAGNOSIS — E039 Hypothyroidism, unspecified: Secondary | ICD-10-CM | POA: Diagnosis not present

## 2023-01-01 DIAGNOSIS — I129 Hypertensive chronic kidney disease with stage 1 through stage 4 chronic kidney disease, or unspecified chronic kidney disease: Secondary | ICD-10-CM | POA: Diagnosis not present

## 2023-01-01 DIAGNOSIS — F0280 Dementia in other diseases classified elsewhere without behavioral disturbance: Secondary | ICD-10-CM | POA: Diagnosis not present

## 2023-01-01 DIAGNOSIS — I2699 Other pulmonary embolism without acute cor pulmonale: Secondary | ICD-10-CM | POA: Diagnosis not present

## 2023-01-01 DIAGNOSIS — N1831 Chronic kidney disease, stage 3a: Secondary | ICD-10-CM | POA: Diagnosis not present

## 2023-01-05 ENCOUNTER — Inpatient Hospital Stay: Payer: Medicare Other | Admitting: Family Medicine

## 2023-01-05 ENCOUNTER — Ambulatory Visit (INDEPENDENT_AMBULATORY_CARE_PROVIDER_SITE_OTHER): Payer: Medicare Other | Admitting: Family Medicine

## 2023-01-05 ENCOUNTER — Encounter: Payer: Self-pay | Admitting: Family Medicine

## 2023-01-05 VITALS — BP 125/49 | HR 69 | Ht 66.0 in | Wt 152.0 lb

## 2023-01-05 DIAGNOSIS — R55 Syncope and collapse: Secondary | ICD-10-CM

## 2023-01-05 DIAGNOSIS — E538 Deficiency of other specified B group vitamins: Secondary | ICD-10-CM | POA: Diagnosis not present

## 2023-01-05 DIAGNOSIS — D649 Anemia, unspecified: Secondary | ICD-10-CM

## 2023-01-05 DIAGNOSIS — D500 Iron deficiency anemia secondary to blood loss (chronic): Secondary | ICD-10-CM

## 2023-01-05 MED ORDER — FERROUS SULFATE 325 (65 FE) MG PO TABS: 325.0000 mg | ORAL_TABLET | ORAL | Status: DC

## 2023-01-08 ENCOUNTER — Inpatient Hospital Stay: Payer: Medicare Other | Attending: Oncology | Admitting: Oncology

## 2023-01-08 ENCOUNTER — Encounter: Payer: Self-pay | Admitting: Oncology

## 2023-01-08 VITALS — BP 125/49 | HR 57 | Temp 98.0°F | Resp 18 | Ht 66.0 in | Wt 151.0 lb

## 2023-01-08 DIAGNOSIS — E039 Hypothyroidism, unspecified: Secondary | ICD-10-CM | POA: Diagnosis not present

## 2023-01-08 DIAGNOSIS — Z7901 Long term (current) use of anticoagulants: Secondary | ICD-10-CM | POA: Insufficient documentation

## 2023-01-08 DIAGNOSIS — Z87891 Personal history of nicotine dependence: Secondary | ICD-10-CM | POA: Diagnosis not present

## 2023-01-08 DIAGNOSIS — Z801 Family history of malignant neoplasm of trachea, bronchus and lung: Secondary | ICD-10-CM | POA: Diagnosis not present

## 2023-01-08 DIAGNOSIS — I2699 Other pulmonary embolism without acute cor pulmonale: Secondary | ICD-10-CM | POA: Diagnosis not present

## 2023-01-08 DIAGNOSIS — F0280 Dementia in other diseases classified elsewhere without behavioral disturbance: Secondary | ICD-10-CM | POA: Diagnosis not present

## 2023-01-08 DIAGNOSIS — D631 Anemia in chronic kidney disease: Secondary | ICD-10-CM | POA: Diagnosis not present

## 2023-01-08 DIAGNOSIS — Z79899 Other long term (current) drug therapy: Secondary | ICD-10-CM | POA: Diagnosis not present

## 2023-01-08 DIAGNOSIS — I129 Hypertensive chronic kidney disease with stage 1 through stage 4 chronic kidney disease, or unspecified chronic kidney disease: Secondary | ICD-10-CM | POA: Diagnosis not present

## 2023-01-08 DIAGNOSIS — N1831 Chronic kidney disease, stage 3a: Secondary | ICD-10-CM | POA: Diagnosis not present

## 2023-01-08 NOTE — Progress Notes (Signed)
[facility]  [phone] Fax:([phone]) [phone]  ID: Roger Cook OB: 1927-08-26  MR#: [id number]  QIO#:[id number]  Patient Care Team: [name], MD as PCP - General (Family Medicine) Galen Manila, MD as Referring Physician (Ophthalmology) Riki Altes, MD (Urology) Dasher, Cliffton Asters, MD (Dermatology)  CHIEF COMPLAINT: Bilateral pulmonary embolism with right heart strain.  INTERVAL HISTORY: Patient returns to clinic today for hospital follow-up and further evaluation. He recently presented to the emergency room with a near syncopal episode. Subsequent workup did not reveal DVT, but bilateral pulmonary embolism with heart strain. Patient was admitted to the hospital 2 weeks ago with GI bleed.  He currently feels at his baseline.  He is tolerating Eliquis without significant side effects.  He has no neurologic complaints. He denies any recent fevers. He has a good appetite and denies weight loss. He has no chest pain, shortness of breath, cough, or hemoptysis. He denies any nausea, vomiting, constipation, or diarrhea. He has no melena or hematochezia. He has no urinary complaints.  Patient offers no specific complaints today.  REVIEW OF SYSTEMS:   Review of Systems  Constitutional: Negative.  Negative for fever, malaise/fatigue and weight loss.  Respiratory: Negative.  Negative for cough, hemoptysis and shortness of breath.   Cardiovascular: Negative.  Negative for chest pain and leg swelling.  Gastrointestinal: Negative.  Negative for abdominal pain, blood in stool and melena.  Genitourinary: Negative.  Negative for hematuria.  Musculoskeletal: Negative.  Negative for back pain.  Skin: Negative.  Negative for rash.  Neurological: Negative.  Negative for dizziness, focal weakness, weakness and headaches.  Psychiatric/Behavioral: Negative.  The patient is not nervous/anxious.     As per HPI. Otherwise, a complete review of systems is  negative.  PAST MEDICAL HISTORY: Past Medical History:  Diagnosis Date   Anemia    Aortic atherosclerosis    Bell palsy    Bilateral renal cysts    Bladder tumor    CAD (coronary artery disease)    DOE (dyspnea on exertion)    Elevated lactic acid level 11/13/2022   GERD (gastroesophageal reflux disease)    History of angina    History of penile implant    History of prostate cancer    Hyperlipidemia    Hypertension    Hypothyroidism    Lipoma    MRSA bacteremia 02/2011   Nephrolithiasis    OSA on CPAP    Pneumonia    as a teenager    PAST SURGICAL HISTORY: Past Surgical History:  Procedure Laterality Date   APPENDECTOMY  2005   CARDIAC CATHETERIZATION N/A 08/13/1990   75% pLCx, 75% mLCx, 25% mLAD, 75% D2, 50% right renal artery; Location: [facility]; Surgeon: Eugenia Pancoast, MD   CATARACT EXTRACTION  2005   also had a macular hole in 2005   CATARACT EXTRACTION  2008   COLONOSCOPY     2003, 2014   COLONOSCOPY WITH PROPOFOL N/A 12/26/2021   Procedure: COLONOSCOPY WITH PROPOFOL;  Surgeon: Midge Minium, MD;  Location: [facility] ENDOSCOPY;  Service: Endoscopy;  Laterality: N/A;   ESOPHAGOGASTRODUODENOSCOPY     2004, 2014   ESOPHAGOGASTRODUODENOSCOPY (EGD) WITH PROPOFOL N/A 12/26/2021   Procedure: ESOPHAGOGASTRODUODENOSCOPY (EGD) WITH PROPOFOL;  Surgeon: Midge Minium, MD;  Location: [facility] ENDOSCOPY;  Service: Endoscopy;  Laterality: N/A;   ESOPHAGOGASTRODUODENOSCOPY (EGD) WITH PROPOFOL N/A 11/15/2022   Procedure: ESOPHAGOGASTRODUODENOSCOPY (EGD) WITH PROPOFOL;  Surgeon: Regis Bill, MD;  Location: [facility] ENDOSCOPY;  Service: Endoscopy;  Laterality: N/A;   HAND  SURGERY Left 06/26/2011   Malignancy removed from left hand   NASAL SEPTUM SURGERY  1986   NASAL SINUS SURGERY  1990   penile inplant  1984   polyp of rectum  2003   PROSTATE SURGERY  1975   Abdominal, had to have radiation treatment with the procedure   PROSTATE SURGERY     TONSILLECTOMY     TRANSURETHRAL RESECTION OF  BLADDER TUMOR N/A 03/12/2021   Procedure: TRANSURETHRAL RESECTION OF BLADDER TUMOR (TURBT);  Surgeon: Riki Altes, MD;  Location: [facility] ORS;  Service: Urology;  Laterality: N/A;    FAMILY HISTORY: Family History  Problem Relation Age of Onset   Lung cancer Mother    Heart disease Father     ADVANCED DIRECTIVES (Y/N):  N  HEALTH MAINTENANCE: Social History   Tobacco Use   Smoking status: Former    Packs/day: 1.00    Years: 16.00    Additional pack years: 0.00    Total pack years: 16.00    Types: Cigarettes    Quit date: 1964    Years since quitting: 60.3   Smokeless tobacco: Never  Vaping Use   Vaping Use: Never used  Substance Use Topics   Alcohol use: Not Currently    Alcohol/week: 2.0 standard drinks of alcohol    Types: 2 Glasses of wine per week   Drug use: No     Colonoscopy:  PAP:  Bone density:  Lipid panel:  Allergies  Allergen Reactions   Levofloxacin Swelling    lips swelling   Povidone Iodine Other (See Comments)    Unknown, can not remember   Sulfa Antibiotics     Unknown, can not remember   Benadryl [Diphenhydramine] Rash    Current Outpatient Medications  Medication Sig Dispense Refill   acetaminophen (TYLENOL) 325 MG tablet Take 2 tablets (650 mg total) by mouth every 6 (six) hours as needed for mild pain (or Fever >/= 101). 20 tablet 0   apixaban (ELIQUIS) 5 MG TABS tablet Take 1 tablet (5 mg total) by mouth 2 (two) times daily. 60 tablet 6   Apoaequorin (PREVAGEN) 10 MG CAPS Take 10 mg by mouth daily.     ARTIFICIAL TEAR SOLUTION OP Place 1 drop into both eyes 2 (two) times daily as needed (dry eyes).     brimonidine (ALPHAGAN) 0.2 % ophthalmic solution Place 1 drop into the left eye 2 (two) times daily.     Calcium Carbonate-Vit D-Min (CALCIUM 1200 PO) Take 1,200 mg by mouth daily.     cyanocobalamin (VITAMIN B12) 1000 MCG tablet Take 1 tablet (1,000 mcg total) by mouth daily. 30 tablet 2   donepezil (ARICEPT) 10 MG tablet TAKE 1 TABLET  BY MOUTH AT BEDTIME (Patient taking differently: Take 10 mg by mouth at bedtime.) 90 tablet 4   ferrous sulfate 325 (65 FE) MG tablet Take 1 tablet (325 mg total) by mouth every other day.     fluticasone (FLONASE) 50 MCG/ACT nasal spray Place 2 sprays into both nostrils daily as needed for allergies. 16 g 5   isosorbide mononitrate (IMDUR) 60 MG 24 hr tablet TAKE ONE TABLET EVERY DAY 90 tablet 3   ketoconazole (NIZORAL) 2 % cream APPLY TO GLANS OF PENIS DAILY 30 g 0   levothyroxine (SYNTHROID) 88 MCG tablet TAKE 1 TABLET EVERY DAY ON EMPTY STOMACHWITH A GLASS OF WATER AT LEAST 30-60 MINBEFORE BREAKFAST 90 tablet 4   memantine (NAMENDA) 10 MG tablet TAKE 1 TABLET BY MOUTH TWO  TIMES DAILY (Patient taking differently: Take 10 mg by mouth 2 (two) times daily.) 180 tablet 4   metoprolol succinate (TOPROL-XL) 25 MG 24 hr tablet Take 12.5 mg by mouth daily.     MULTIPLE VITAMIN PO Take 1 tablet by mouth daily.     NON FORMULARY CPAP (Free Text) - Historical Medication  As directed  Started 22-Sep-1994 Active     pravastatin (PRAVACHOL) 40 MG tablet TAKE 1 TABLET BY MOUTH DAILY 90 tablet 4   PREVIDENT 5000 DRY MOUTH 1.1 % GEL dental gel Place 1 application  onto teeth 2 (two) times daily.     No current facility-administered medications for this visit.    OBJECTIVE: Vitals:   01/08/23 1505  BP: (!) 125/49  Pulse: (!) 57  Resp: 18  Temp: 98 F (36.7 C)  SpO2: 97%     Body mass index is 24.37 kg/m.    ECOG FS:0 - Asymptomatic  General: Well-developed, well-nourished, no acute distress. Eyes: Pink conjunctiva, anicteric sclera. HEENT: Normocephalic, moist mucous membranes. Lungs: No audible wheezing or coughing. Heart: Regular rate and rhythm. Abdomen: Soft, nontender, no obvious distention. Musculoskeletal: No edema, cyanosis, or clubbing. Neuro: Alert, answering all questions appropriately. Cranial nerves grossly intact. Skin: No rashes or petechiae noted. Psych: Normal  affect. Lymphatics: No cervical, calvicular, axillary or inguinal LAD.   LAB RESULTS:  Lab Results  Component Value Date   NA 138 12/21/2022   K 3.8 12/21/2022   CL 110 12/21/2022   CO2 21 (L) 12/21/2022   GLUCOSE 117 (H) 12/21/2022   BUN 25 (H) 12/21/2022   CREATININE 1.26 (H) 12/21/2022   CALCIUM 8.5 (L) 12/21/2022   PROT 7.1 12/06/2022   ALBUMIN 3.4 (L) 12/06/2022   AST 29 12/06/2022   ALT 19 12/06/2022   ALKPHOS 77 12/06/2022   BILITOT 0.5 12/06/2022   GFRNONAA 53 (L) 12/21/2022   GFRAA 51 (L) 06/05/2020    Lab Results  Component Value Date   WBC 6.9 12/21/2022   NEUTROABS 5.5 12/09/2022   HGB 9.7 (L) 12/21/2022   HCT 30.6 (L) 12/21/2022   MCV 85.0 12/21/2022   PLT 255 12/21/2022     STUDIES: CT Angio Chest PE W and/or Wo Contrast  Result Date: 12/20/2022 CLINICAL DATA:  Pulmonary embolism suspected EXAM: CT ANGIOGRAPHY CHEST WITH CONTRAST TECHNIQUE: Multidetector CT imaging of the chest was performed using the standard protocol during bolus administration of intravenous contrast. Multiplanar CT image reconstructions and MIPs were obtained to evaluate the vascular anatomy. RADIATION DOSE REDUCTION: This exam was performed according to the departmental dose-optimization program which includes automated exposure control, adjustment of the mA and/or kV according to patient size and/or use of iterative reconstruction technique. CONTRAST:  75mL OMNIPAQUE IOHEXOL 350 MG/ML SOLN COMPARISON:  12/06/2022 FINDINGS: Cardiovascular: Known recent pulmonary emboli. Clot burden is definitely improved, clot in the lower lobe bronchi is no longer occlusive. Lingular clot is also notably diminished and right middle lobe clot is no longer seen. No right heart dilatation. Atherosclerotic calcification of the aorta and coronaries. Mediastinum/Nodes: No adenopathy or mass. Lungs/Pleura: Mild dependent atelectasis. There is no edema, consolidation, effusion, or pneumothorax. Upper Abdomen: No  acute finding Musculoskeletal: Chronic T3 compression fracture. T2-3 a T3-4 facet ankylosis Review of the MIP images confirms the above findings. IMPRESSION: Recent pulmonary emboli with significantly improved clot burden since 12/06/2022. Nonocclusive clot is still seen in the lower lobes and lingula. No new abnormality. Electronically Signed   By: Audry Riles.D.  On: 12/20/2022 07:44   DG Chest Portable 1 View  Result Date: 12/19/2022 CLINICAL DATA:  Provided history: Syncope. EXAM: PORTABLE CHEST 1 VIEW COMPARISON:  Prior chest radiographs 12/06/2022 and earlier. FINDINGS: Heart size within normal limits. Aortic atherosclerosis. Small ill-defined opacity within the right lung base, decreased in size from the prior chest radiographs of 12/06/2022. Elsewhere, there is no appreciable airspace consolidation. No evidence of pleural effusion or pneumothorax. No acute osseous abnormality identified. IMPRESSION: 1. Small ill-defined opacity within the right lung base, decreased in size from the prior chest radiographs of 12/06/2022. Correlating with the prior chest CT of 12/06/2022, this likely reflects atelectasis and/or scarring. 2. Aortic Atherosclerosis (ICD10-I70.0). Electronically Signed   By: Jackey Loge D.O.   On: 12/19/2022 16:24   CT HEAD WO CONTRAST ( )  Result Date: 12/19/2022 CLINICAL DATA:  Mental status change EXAM: CT HEAD WITHOUT CONTRAST TECHNIQUE: Contiguous axial images were obtained from the base of the skull through the vertex without intravenous contrast. RADIATION DOSE REDUCTION: This exam was performed according to the departmental dose-optimization program which includes automated exposure control, adjustment of the mA and/or kV according to patient size and/or use of iterative reconstruction technique. COMPARISON:  12/06/2022 FINDINGS: Brain: No acute territorial infarction, hemorrhage or intracranial mass. Atrophy and chronic small vessel ischemic changes of the white matter.  Stable ventricle size. Remote lacunar infarct left basal ganglia Vascular: No hyperdense vessels.  Carotid vascular calcification Skull: Normal. Negative for fracture or focal lesion. Sinuses/Orbits: Postsurgical changes of the sinuses. Moderate mucosal thickening left maxillary sinus and opacified frontal sinuses. Other: None IMPRESSION: 1. No CT evidence for acute intracranial abnormality. 2. Atrophy and chronic small vessel ischemic changes of the white matter. Electronically Signed   By: Jasmine Pang M.D.   On: 12/19/2022 16:17    ASSESSMENT: Bilateral pulmonary embolism with right heart strain.  PLAN:    Bilateral pulmonary embolism with right heart strain: CT scan results from December 06, 2022 reviewed independently with marked severity bilateral pulmonary embolism with evidence of right heart strain.  Lower extremity Doppler did not reveal any DVT.  Patient was in the hospital 2 weeks prior with GI bleed.  Vascular surgery did not feel IVC filter was necessary.  While there is increased risk of placing patient on anticoagulation, given the size of patient's bilateral pulmonary embolism the benefit currently outweighs the risk.  Patient has been instructed that if he has any evidence of a repeat GI bleed he needs to discontinue Eliquis immediately and proceed to the emergency room.  Have recommended a total of 6 months of treatment completing approximately June 23, 2023.  Return to clinic first week of October for further evaluation and likely discontinuation of Eliquis.   I spent a total of 30 minutes reviewing chart data, face-to-face evaluation with the patient, counseling and coordination of care as detailed above.  Patient expressed understanding and was in agreement with this plan. He also understands that He can call clinic at any time with any questions, concerns, or complaints.    Jeralyn Ruths, MD   01/08/2023 4:05 PM

## 2023-01-09 ENCOUNTER — Inpatient Hospital Stay: Payer: Medicare Other | Admitting: Oncology

## 2023-01-09 DIAGNOSIS — E039 Hypothyroidism, unspecified: Secondary | ICD-10-CM | POA: Diagnosis not present

## 2023-01-09 DIAGNOSIS — N1831 Chronic kidney disease, stage 3a: Secondary | ICD-10-CM | POA: Diagnosis not present

## 2023-01-09 DIAGNOSIS — I2699 Other pulmonary embolism without acute cor pulmonale: Secondary | ICD-10-CM | POA: Diagnosis not present

## 2023-01-09 DIAGNOSIS — F0280 Dementia in other diseases classified elsewhere without behavioral disturbance: Secondary | ICD-10-CM | POA: Diagnosis not present

## 2023-01-09 DIAGNOSIS — D631 Anemia in chronic kidney disease: Secondary | ICD-10-CM | POA: Diagnosis not present

## 2023-01-09 DIAGNOSIS — I129 Hypertensive chronic kidney disease with stage 1 through stage 4 chronic kidney disease, or unspecified chronic kidney disease: Secondary | ICD-10-CM | POA: Diagnosis not present

## 2023-01-14 DIAGNOSIS — D649 Anemia, unspecified: Secondary | ICD-10-CM | POA: Diagnosis not present

## 2023-01-14 DIAGNOSIS — F39 Unspecified mood [affective] disorder: Secondary | ICD-10-CM | POA: Diagnosis not present

## 2023-01-14 DIAGNOSIS — R5383 Other fatigue: Secondary | ICD-10-CM | POA: Diagnosis not present

## 2023-01-14 DIAGNOSIS — R413 Other amnesia: Secondary | ICD-10-CM | POA: Diagnosis not present

## 2023-01-15 DIAGNOSIS — I129 Hypertensive chronic kidney disease with stage 1 through stage 4 chronic kidney disease, or unspecified chronic kidney disease: Secondary | ICD-10-CM | POA: Diagnosis not present

## 2023-01-15 DIAGNOSIS — E039 Hypothyroidism, unspecified: Secondary | ICD-10-CM | POA: Diagnosis not present

## 2023-01-15 DIAGNOSIS — N1831 Chronic kidney disease, stage 3a: Secondary | ICD-10-CM | POA: Diagnosis not present

## 2023-01-15 DIAGNOSIS — F0280 Dementia in other diseases classified elsewhere without behavioral disturbance: Secondary | ICD-10-CM | POA: Diagnosis not present

## 2023-01-15 DIAGNOSIS — D631 Anemia in chronic kidney disease: Secondary | ICD-10-CM | POA: Diagnosis not present

## 2023-01-15 DIAGNOSIS — I2699 Other pulmonary embolism without acute cor pulmonale: Secondary | ICD-10-CM | POA: Diagnosis not present

## 2023-01-16 NOTE — Progress Notes (Signed)
Established patient visit   Patient: Roger Cook   DOB: 1927/04/01   89 y.o. Male  MRN: [id number] Visit Date: 01/05/2023  Today's healthcare provider: Mila Merry, MD    Subjective    HPI Follow up hospitalization 12/19/2022 through 12/21/2022 for syncopal episode and anemia, receiving 1 unit PRBCs. Arranged were made with Dr. Juliann Pares for [facility] patch. His long history of chronic anemia worked up by GI with findings of AVM on colonoscopy and on chronic DOAC for pulmonary embolism. He has been back to his baseline since hospital discharge, there has been no blood in stool or melena noted. He has been referred to hematology and has appt with Dr. Orlie Dakin scheduled for 4/18. Has also been referred to neurology for dementia and has appointment scheduled 01/14/2023.   Medications: Outpatient Medications Prior to Visit  Medication Sig   acetaminophen (TYLENOL) 325 MG tablet Take 2 tablets (650 mg total) by mouth every 6 (six) hours as needed for mild pain (or Fever >/= 101).   apixaban (ELIQUIS) 5 MG TABS tablet Take 1 tablet (5 mg total) by mouth 2 (two) times daily.   Apoaequorin (PREVAGEN) 10 MG CAPS Take 10 mg by mouth daily.   ARTIFICIAL TEAR SOLUTION OP Place 1 drop into both eyes 2 (two) times daily as needed (dry eyes).   brimonidine (ALPHAGAN) 0.2 % ophthalmic solution Place 1 drop into the left eye 2 (two) times daily.   Calcium Carbonate-Vit D-Min (CALCIUM 1200 PO) Take 1,200 mg by mouth daily.   cyanocobalamin (VITAMIN B12) 1000 MCG tablet Take 1 tablet (1,000 mcg total) by mouth daily.   donepezil (ARICEPT) 10 MG tablet TAKE 1 TABLET BY MOUTH AT BEDTIME (Patient taking differently: Take 10 mg by mouth at bedtime.)   fluticasone (FLONASE) 50 MCG/ACT nasal spray Place 2 sprays into both nostrils daily as needed for allergies.   isosorbide mononitrate (IMDUR) 60 MG 24 hr tablet TAKE ONE TABLET EVERY DAY   ketoconazole (NIZORAL) 2 % cream APPLY TO GLANS OF PENIS DAILY    levothyroxine (SYNTHROID) 88 MCG tablet TAKE 1 TABLET EVERY DAY ON EMPTY STOMACHWITH A GLASS OF WATER AT LEAST 30-60 MINBEFORE BREAKFAST   memantine (NAMENDA) 10 MG tablet TAKE 1 TABLET BY MOUTH TWO TIMES DAILY (Patient taking differently: Take 10 mg by mouth 2 (two) times daily.)   metoprolol succinate (TOPROL-XL) 25 MG 24 hr tablet Take 12.5 mg by mouth daily.   MULTIPLE VITAMIN PO Take 1 tablet by mouth daily.   NON FORMULARY CPAP (Free Text) - Historical Medication  As directed  Started 22-Sep-1994 Active   pravastatin (PRAVACHOL) 40 MG tablet TAKE 1 TABLET BY MOUTH DAILY   PREVIDENT 5000 DRY MOUTH 1.1 % GEL dental gel Place 1 application  onto teeth 2 (two) times daily.   [DISCONTINUED] ferrous sulfate 325 (65 FE) MG tablet Take 1 tablet (325 mg total) by mouth 2 (two) times daily with a meal.   No facility-administered medications prior to visit.    Review of Systems  Constitutional:  Negative for appetite change, chills and fever.  Respiratory:  Negative for chest tightness, shortness of breath and wheezing.   Cardiovascular:  Negative for chest pain and palpitations.  Gastrointestinal:  Negative for abdominal pain, nausea and vomiting.       Objective    BP (!) 125/49 (BP Location: Right Arm, Patient Position: Sitting, Cuff Size: Normal)   Pulse 69   Ht 5\' 6"  (1.676 m)   Wt 152  lb (68.9 kg)   SpO2 96%   BMI 24.53 kg/m    Physical Exam   General: Appearance:    Well developed, well nourished male in no acute distress  Eyes:    PERRL, conjunctiva/corneas clear, EOM's intact       Lungs:     Clear to auscultation bilaterally, respirations unlabored  Heart:    Normal heart rate. Normal rhythm. No murmurs, rubs, or gallops.    MS:   All extremities are intact.    Neurologic:   Awake, alert, oriented x 1. No apparent focal neurological defect.         Assessment & Plan     1. Syncope, unspecified syncope type   2. Severe anemia Likely chronic GI blood loss from AVM,  has had extensive GI work up.   3. Iron deficiency anemia due to chronic blood loss Recommend OTC oral iron sulfate. He can discuss iron infusions with hematology at upcoming appointment.   4. Vitamin B12 deficiency Recommend 1000 mg b12 daily.           Mila Merry, MD  [facility] [phone] (phone) [phone] (fax)  [facility]

## 2023-01-19 DIAGNOSIS — I2699 Other pulmonary embolism without acute cor pulmonale: Secondary | ICD-10-CM | POA: Diagnosis not present

## 2023-01-19 DIAGNOSIS — I129 Hypertensive chronic kidney disease with stage 1 through stage 4 chronic kidney disease, or unspecified chronic kidney disease: Secondary | ICD-10-CM | POA: Diagnosis not present

## 2023-01-19 DIAGNOSIS — F0280 Dementia in other diseases classified elsewhere without behavioral disturbance: Secondary | ICD-10-CM | POA: Diagnosis not present

## 2023-01-19 DIAGNOSIS — D631 Anemia in chronic kidney disease: Secondary | ICD-10-CM | POA: Diagnosis not present

## 2023-01-19 DIAGNOSIS — N1831 Chronic kidney disease, stage 3a: Secondary | ICD-10-CM | POA: Diagnosis not present

## 2023-01-19 DIAGNOSIS — E039 Hypothyroidism, unspecified: Secondary | ICD-10-CM | POA: Diagnosis not present

## 2023-01-24 ENCOUNTER — Emergency Department: Payer: Medicare Other

## 2023-01-24 ENCOUNTER — Inpatient Hospital Stay
Admission: EM | Admit: 2023-01-24 | Discharge: 2023-01-27 | DRG: 309 | Disposition: A | Discharge status: Home Health | Payer: Medicare Other | Attending: Student | Admitting: Student

## 2023-01-24 DIAGNOSIS — I6381 Other cerebral infarction due to occlusion or stenosis of small artery: Secondary | ICD-10-CM | POA: Diagnosis not present

## 2023-01-24 DIAGNOSIS — Z86711 Personal history of pulmonary embolism: Secondary | ICD-10-CM

## 2023-01-24 DIAGNOSIS — R55 Syncope and collapse: Principal | ICD-10-CM

## 2023-01-24 DIAGNOSIS — Z66 Do not resuscitate: Secondary | ICD-10-CM | POA: Diagnosis present

## 2023-01-24 DIAGNOSIS — F32A Depression, unspecified: Secondary | ICD-10-CM | POA: Diagnosis present

## 2023-01-24 DIAGNOSIS — E860 Dehydration: Secondary | ICD-10-CM | POA: Diagnosis present

## 2023-01-24 DIAGNOSIS — I951 Orthostatic hypotension: Secondary | ICD-10-CM | POA: Diagnosis not present

## 2023-01-24 DIAGNOSIS — N179 Acute kidney failure, unspecified: Secondary | ICD-10-CM | POA: Diagnosis not present

## 2023-01-24 DIAGNOSIS — F0280 Dementia in other diseases classified elsewhere without behavioral disturbance: Secondary | ICD-10-CM | POA: Diagnosis not present

## 2023-01-24 DIAGNOSIS — Z79890 Hormone replacement therapy (postmenopausal): Secondary | ICD-10-CM | POA: Diagnosis not present

## 2023-01-24 DIAGNOSIS — I959 Hypotension, unspecified: Secondary | ICD-10-CM | POA: Diagnosis not present

## 2023-01-24 DIAGNOSIS — Z8249 Family history of ischemic heart disease and other diseases of the circulatory system: Secondary | ICD-10-CM

## 2023-01-24 DIAGNOSIS — Z8546 Personal history of malignant neoplasm of prostate: Secondary | ICD-10-CM | POA: Diagnosis not present

## 2023-01-24 DIAGNOSIS — R001 Bradycardia, unspecified: Secondary | ICD-10-CM | POA: Diagnosis not present

## 2023-01-24 DIAGNOSIS — N200 Calculus of kidney: Secondary | ICD-10-CM | POA: Diagnosis not present

## 2023-01-24 DIAGNOSIS — N1831 Chronic kidney disease, stage 3a: Secondary | ICD-10-CM | POA: Diagnosis present

## 2023-01-24 DIAGNOSIS — Z960 Presence of urogenital implants: Secondary | ICD-10-CM | POA: Diagnosis present

## 2023-01-24 DIAGNOSIS — F0393 Unspecified dementia, unspecified severity, with mood disturbance: Secondary | ICD-10-CM | POA: Diagnosis present

## 2023-01-24 DIAGNOSIS — I129 Hypertensive chronic kidney disease with stage 1 through stage 4 chronic kidney disease, or unspecified chronic kidney disease: Secondary | ICD-10-CM | POA: Diagnosis present

## 2023-01-24 DIAGNOSIS — N281 Cyst of kidney, acquired: Secondary | ICD-10-CM | POA: Diagnosis not present

## 2023-01-24 DIAGNOSIS — D649 Anemia, unspecified: Secondary | ICD-10-CM | POA: Diagnosis present

## 2023-01-24 DIAGNOSIS — Z7901 Long term (current) use of anticoagulants: Secondary | ICD-10-CM

## 2023-01-24 DIAGNOSIS — D631 Anemia in chronic kidney disease: Secondary | ICD-10-CM | POA: Diagnosis not present

## 2023-01-24 DIAGNOSIS — R4182 Altered mental status, unspecified: Secondary | ICD-10-CM | POA: Diagnosis not present

## 2023-01-24 DIAGNOSIS — E785 Hyperlipidemia, unspecified: Secondary | ICD-10-CM | POA: Diagnosis present

## 2023-01-24 DIAGNOSIS — Z79899 Other long term (current) drug therapy: Secondary | ICD-10-CM | POA: Diagnosis not present

## 2023-01-24 DIAGNOSIS — F0390 Unspecified dementia without behavioral disturbance: Secondary | ICD-10-CM | POA: Diagnosis not present

## 2023-01-24 DIAGNOSIS — Z881 Allergy status to other antibiotic agents status: Secondary | ICD-10-CM

## 2023-01-24 DIAGNOSIS — G4733 Obstructive sleep apnea (adult) (pediatric): Secondary | ICD-10-CM | POA: Diagnosis not present

## 2023-01-24 DIAGNOSIS — Z801 Family history of malignant neoplasm of trachea, bronchus and lung: Secondary | ICD-10-CM | POA: Diagnosis not present

## 2023-01-24 DIAGNOSIS — I2699 Other pulmonary embolism without acute cor pulmonale: Secondary | ICD-10-CM | POA: Diagnosis not present

## 2023-01-24 DIAGNOSIS — K219 Gastro-esophageal reflux disease without esophagitis: Secondary | ICD-10-CM | POA: Diagnosis present

## 2023-01-24 DIAGNOSIS — G510 Bell's palsy: Secondary | ICD-10-CM | POA: Diagnosis not present

## 2023-01-24 DIAGNOSIS — I10 Essential (primary) hypertension: Secondary | ICD-10-CM | POA: Diagnosis not present

## 2023-01-24 DIAGNOSIS — I2510 Atherosclerotic heart disease of native coronary artery without angina pectoris: Secondary | ICD-10-CM | POA: Diagnosis present

## 2023-01-24 DIAGNOSIS — R404 Transient alteration of awareness: Secondary | ICD-10-CM | POA: Diagnosis not present

## 2023-01-24 DIAGNOSIS — E039 Hypothyroidism, unspecified: Secondary | ICD-10-CM | POA: Diagnosis present

## 2023-01-24 DIAGNOSIS — I700 Atherosclerosis of aorta: Secondary | ICD-10-CM | POA: Diagnosis not present

## 2023-01-24 DIAGNOSIS — Z7189 Other specified counseling: Secondary | ICD-10-CM | POA: Diagnosis not present

## 2023-01-24 DIAGNOSIS — Z87891 Personal history of nicotine dependence: Secondary | ICD-10-CM

## 2023-01-24 DIAGNOSIS — Z91041 Radiographic dye allergy status: Secondary | ICD-10-CM

## 2023-01-24 DIAGNOSIS — Z882 Allergy status to sulfonamides status: Secondary | ICD-10-CM

## 2023-01-24 DIAGNOSIS — J9811 Atelectasis: Secondary | ICD-10-CM | POA: Diagnosis not present

## 2023-01-24 DIAGNOSIS — R0902 Hypoxemia: Secondary | ICD-10-CM | POA: Diagnosis not present

## 2023-01-24 DIAGNOSIS — G4730 Sleep apnea, unspecified: Secondary | ICD-10-CM | POA: Diagnosis present

## 2023-01-24 DIAGNOSIS — Z8673 Personal history of transient ischemic attack (TIA), and cerebral infarction without residual deficits: Secondary | ICD-10-CM

## 2023-01-24 DIAGNOSIS — Z515 Encounter for palliative care: Secondary | ICD-10-CM | POA: Diagnosis not present

## 2023-01-24 DIAGNOSIS — R0689 Other abnormalities of breathing: Secondary | ICD-10-CM | POA: Diagnosis not present

## 2023-01-24 LAB — COMPREHENSIVE METABOLIC PANEL
ALT: 20 U/L (ref 0–44)
AST: 28 U/L (ref 15–41)
Albumin: 3.9 g/dL (ref 3.5–5.0)
Alkaline Phosphatase: 49 U/L (ref 38–126)
Anion gap: 8 (ref 5–15)
BUN: 28 mg/dL — ABNORMAL HIGH (ref 8–23)
CO2: 25 mmol/L (ref 22–32)
Calcium: 8.7 mg/dL — ABNORMAL LOW (ref 8.9–10.3)
Chloride: 107 mmol/L (ref 98–111)
Creatinine, Ser: 1.37 mg/dL — ABNORMAL HIGH (ref 0.61–1.24)
GFR, Estimated: 48 mL/min — ABNORMAL LOW (ref >60)
Glucose, Bld: 95 mg/dL (ref 70–99)
Potassium: 4.4 mmol/L (ref 3.5–5.1)
Sodium: 140 mmol/L (ref 135–145)
Total Bilirubin: 0.6 mg/dL (ref 0.3–1.2)
Total Protein: 6.8 g/dL (ref 6.5–8.1)

## 2023-01-24 LAB — URINALYSIS, W/ REFLEX TO CULTURE (INFECTION SUSPECTED)
Bacteria, UA: NONE SEEN
Bilirubin Urine: NEGATIVE
Glucose, UA: NEGATIVE mg/dL
Ketones, ur: NEGATIVE mg/dL
Leukocytes,Ua: NEGATIVE
Nitrite: NEGATIVE
Protein, ur: NEGATIVE mg/dL
RBC / HPF: >50 RBC/hpf (ref 0–5)
Specific Gravity, Urine: 1.012 (ref 1.005–1.030)
Squamous Epithelial / HPF: NONE SEEN /HPF (ref 0–5)
pH: 6 (ref 5.0–8.0)

## 2023-01-24 LAB — CBC WITH DIFFERENTIAL/PLATELET
Abs Immature Granulocytes: 0.05 10*3/uL (ref 0.00–0.07)
Basophils Absolute: 0 10*3/uL (ref 0.0–0.1)
Basophils Relative: 0 %
Eosinophils Absolute: 0.2 10*3/uL (ref 0.0–0.5)
Eosinophils Relative: 2 %
HCT: 32.9 % — ABNORMAL LOW (ref 39.0–52.0)
Hemoglobin: 10.3 g/dL — ABNORMAL LOW (ref 13.0–17.0)
Immature Granulocytes: 1 %
Lymphocytes Relative: 15 %
Lymphs Abs: 1.3 10*3/uL (ref 0.7–4.0)
MCH: 29.9 pg (ref 26.0–34.0)
MCHC: 31.3 g/dL (ref 30.0–36.0)
MCV: 95.6 fL (ref 80.0–100.0)
Monocytes Absolute: 0.7 10*3/uL (ref 0.1–1.0)
Monocytes Relative: 7 %
Neutro Abs: 6.8 10*3/uL (ref 1.7–7.7)
Neutrophils Relative %: 75 %
Platelets: 220 10*3/uL (ref 150–400)
RBC: 3.44 MIL/uL — ABNORMAL LOW (ref 4.22–5.81)
RDW: 19.4 % — ABNORMAL HIGH (ref 11.5–15.5)
WBC: 9 10*3/uL (ref 4.0–10.5)
nRBC: 0 % (ref 0.0–0.2)

## 2023-01-24 LAB — LACTIC ACID, PLASMA
Lactic Acid, Venous: 1.4 mmol/L (ref 0.5–1.9)
Lactic Acid, Venous: 1.3 mmol/L (ref 0.5–1.9)

## 2023-01-24 LAB — TROPONIN I (HIGH SENSITIVITY): Troponin I (High Sensitivity): 8 ng/L (ref <18)

## 2023-01-24 LAB — PROTIME-INR
INR: 1.4 — ABNORMAL HIGH (ref 0.8–1.2)
Prothrombin Time: 16.7 seconds — ABNORMAL HIGH (ref 11.4–15.2)

## 2023-01-24 LAB — CBG MONITORING, ED: Glucose-Capillary: 96 mg/dL (ref 70–99)

## 2023-01-24 LAB — APTT: aPTT: 32 seconds (ref 24–36)

## 2023-01-24 NOTE — ED Notes (Signed)
CBG 96. 

## 2023-01-24 NOTE — Assessment & Plan Note (Addendum)
Vitals:   01/24/23 1901 01/24/23 1915 01/24/23 1930 01/24/23 2330  BP: (!) 153/51 (!) 195/54 (!) 169/52 (!) 191/53  Will hold patient's metoprolol and Imdur. Start patient on scheduled hydralazine for blood pressure control.

## 2023-01-24 NOTE — Assessment & Plan Note (Signed)
Lab Results  Component Value Date   CREATININE 1.37 (H) 01/24/2023   CREATININE 1.26 (H) 12/21/2022   CREATININE 1.34 (H) 12/20/2022  .  Kidney function is currently at baseline we will avoid contrast and renally dose all needed medications.

## 2023-01-24 NOTE — ED Provider Notes (Signed)
[facility] Provider Note    Event Date/Time   First MD Initiated Contact with Patient 01/24/23 1856     (approximate)   History   Bradycardia and Altered Mental Status   HPI  Roger Cook is a 89 y.o. male who presents to the emergency department today because of concerns for syncopal episode and altered mental status.  The patient himself is unable to give any significant history.  When EMS arrived they found the patient to be bradycardic in the 40s and hypotensive.  Because of this they administered versed and started cardiac pacing.  Per chart review the patient has had recent admission for syncope and was found to have pulmonary embolisms.     Physical Exam   Triage Vital Signs: ED Triage Vitals  Enc Vitals Group     BP 01/24/23 1901 (!) 153/51     Pulse Rate 01/24/23 1901 (!) 52     Resp 01/24/23 1901 15     Temp --      Temp src --      SpO2 01/24/23 1901 100 %     Weight 01/24/23 1900 157 lb 12.8 oz (71.6 kg)     Height --      Head Circumference --      Peak Flow --      Pain Score --      Pain Loc --      Pain Edu? --      Excl. in GC? --     Most recent vital signs: Vitals:   01/24/23 1901  BP: (!) 153/51  Pulse: (!) 52  Resp: 15  SpO2: 100%    General: Somnolent. CV:  Good peripheral perfusion. Bradycardia. Resp:  Normal effort. Lungs clear. Abd:  No distention.     ED Results / Procedures / Treatments   Labs (all labs ordered are listed, but only abnormal results are displayed) Labs Reviewed  COMPREHENSIVE METABOLIC PANEL - Abnormal; Notable for the following components:      Result Value   BUN 28 (*)    Creatinine, Ser 1.37 (*)    Calcium 8.7 (*)    GFR, Estimated 48 (*)    All other components within normal limits  CBC WITH DIFFERENTIAL/PLATELET - Abnormal; Notable for the following components:   RBC 3.44 (*)    Hemoglobin 10.3 (*)    HCT 32.9 (*)    RDW 19.4 (*)    All other components within normal  limits  PROTIME-INR - Abnormal; Notable for the following components:   Prothrombin Time 16.7 (*)    INR 1.4 (*)    All other components within normal limits  URINALYSIS, W/ REFLEX TO CULTURE (INFECTION SUSPECTED) - Abnormal; Notable for the following components:   Color, Urine YELLOW (*)    APPearance HAZY (*)    Hgb urine dipstick MODERATE (*)    All other components within normal limits  CULTURE, BLOOD (ROUTINE X 2)  CULTURE, BLOOD (ROUTINE X 2)  LACTIC ACID, PLASMA  LACTIC ACID, PLASMA  APTT  CBG MONITORING, ED  TROPONIN I (HIGH SENSITIVITY)     EKG  I, Phineas Semen, attending physician, personally viewed and interpreted this EKG  EKG Time: 1853 Rate: 53 Rhythm: sinus bradycardia Axis: normal Intervals: qtc 451 QRS: narrow ST changes: no st elevation Impression: abnormal ekg   RADIOLOGY I independently interpreted and visualized the CT head. My interpretation: No bleed Radiology interpretation:  IMPRESSION:  1. No acute intracranial  CT findings or intracranial interval  changes.  2. Chronic changes.  3. Sinus disease, again with complete opacification of the frontal  sinuses.   I independently interpreted and visualized the CXR. My interpretation: No pneumonia Radiology interpretation:  IMPRESSION:  Bibasilar atelectasis or scarring.    No active disease.       PROCEDURES:  Critical Care performed: Yes  CRITICAL CARE Performed by: Phineas Semen   Total critical care time: 35 minutes  Critical care time was exclusive of separately billable procedures and treating other patients.  Critical care was necessary to treat or prevent imminent or life-threatening deterioration.  Critical care was time spent personally by me on the following activities: development of treatment plan with patient and/or surrogate as well as nursing, discussions with consultants, evaluation of patient's response to treatment, examination of patient, obtaining history  from patient or surrogate, ordering and performing treatments and interventions, ordering and review of laboratory studies, ordering and review of radiographic studies, pulse oximetry and re-evaluation of patient's condition.   Procedures    MEDICATIONS ORDERED IN ED: Medications - No data to display   IMPRESSION / MDM / ASSESSMENT AND PLAN / ED COURSE  I reviewed the triage vital signs and the nursing notes.                              Differential diagnosis includes, but is not limited to, arrhythmia, anemia, infection.  Patient's presentation is most consistent with acute presentation with potential threat to life or bodily function.   The patient is on the cardiac monitor to evaluate for evidence of arrhythmia and/or significant heart rate changes.  Patient presented to the emergency department today from home after a apparent syncopal episode.  When EMS arrived patient was altered with significant bradycardia and hypotension.  Because of this they did initiate pacing and gave patient Versed.  Upon arrival to the emergency department patient was quite sedated.  We did take him off the cardiac pacing and he maintained good blood pressure with rates in the 50s.  Family did arrive to the emergency department stated that patient has had syncopal episodes recently.  Blood work here without significant anemia.  No significant electrolyte abnormalities.  Patient was maintained on cardiac monitoring here without any significant bradycardia.  However given his significant bradycardia upon EMS arrival & episode do think patient would benefit from further workup and management.  Discussed with Dr. Allena Katz with the hospitalist service who will plan on admission.      FINAL CLINICAL IMPRESSION(S) / ED DIAGNOSES   Final diagnoses:  Syncope, unspecified syncope type  Bradycardia    Note:  This document was prepared using Dragon voice recognition software and may include unintentional dictation  errors.    Phineas Semen, MD 01/25/23 [phone]

## 2023-01-24 NOTE — Assessment & Plan Note (Addendum)
Will continue patient on Eliquis.

## 2023-01-24 NOTE — Assessment & Plan Note (Addendum)
   Sinus bradycardia and low voltage.  Will continue patient on Eliquis.  Will hold patient's metoprolol.  And Imdur.

## 2023-01-24 NOTE — ED Triage Notes (Signed)
Pt arrived via ems from home. Pts wife called ems due to pt being unresponsive. Pt HR was in the 30's on ems arrival. Pt given 2.5mg  versed and externally paced. Pt given an additional 2.5mg  of versed en route. Pt was ambu-bagged with assisted ventilations on arrival. Pt able to be non-paced and respirate on own once roomed in ED. Pt able to respond to voice at this time.

## 2023-01-24 NOTE — Assessment & Plan Note (Signed)
Stable and at baseline we will continue patient's Aricept and Namenda and Paxil.

## 2023-01-24 NOTE — H&P (Incomplete)
History and Physical     Patient: Roger Cook:[id number] DOB: Oct 22, 1926 DOA: 01/24/2023 DOS: the patient was seen and examined on 01/24/2023 PCP: [name], MD   Patient coming from: Home  Chief Complaint: AMS Cardiology : [facility]: Dr. Juliann Pares.   HISTORY OF PRESENT ILLNESS: Roger Cook is an 89 y.o. male ***  Past Medical History:  Diagnosis Date  . Anemia   . Aortic atherosclerosis (HCC)   . Bell palsy   . Bilateral renal cysts   . Bladder tumor   . CAD (coronary artery disease)   . DOE (dyspnea on exertion)   . Elevated lactic acid level 11/13/2022  . GERD (gastroesophageal reflux disease)   . History of angina   . History of penile implant   . History of prostate cancer   . Hyperlipidemia   . Hypertension   . Hypothyroidism   . Lipoma   . MRSA bacteremia 02/2011  . Nephrolithiasis   . OSA on CPAP   . Pneumonia    as a teenager   Review of Systems  Neurological:  Positive for syncope.   Allergies  Allergen Reactions  . Levofloxacin Swelling    lips swelling  . Povidone Iodine Other (See Comments)    Unknown, can not remember  . Sulfa Antibiotics     Unknown, can not remember  . Benadryl [Diphenhydramine] Rash   Past Surgical History:  Procedure Laterality Date  . APPENDECTOMY  2005  . CARDIAC CATHETERIZATION N/A 08/13/1990   75% pLCx, 75% mLCx, 25% mLAD, 75% D2, 50% right renal artery; Location: [facility]; Surgeon: Eugenia Pancoast, MD  . CATARACT EXTRACTION  2005   also had a macular hole in 2005  . CATARACT EXTRACTION  2008  . COLONOSCOPY     2003, 2014  . COLONOSCOPY WITH PROPOFOL N/A 12/26/2021   Procedure: COLONOSCOPY WITH PROPOFOL;  Surgeon: Midge Minium, MD;  Location: [facility] ENDOSCOPY;  Service: Endoscopy;  Laterality: N/A;  . ESOPHAGOGASTRODUODENOSCOPY     2004, 2014  . ESOPHAGOGASTRODUODENOSCOPY (EGD) WITH PROPOFOL N/A 12/26/2021   Procedure: ESOPHAGOGASTRODUODENOSCOPY (EGD) WITH PROPOFOL;  Surgeon: Midge Minium, MD;   Location: [facility] ENDOSCOPY;  Service: Endoscopy;  Laterality: N/A;  . ESOPHAGOGASTRODUODENOSCOPY (EGD) WITH PROPOFOL N/A 11/15/2022   Procedure: ESOPHAGOGASTRODUODENOSCOPY (EGD) WITH PROPOFOL;  Surgeon: Regis Bill, MD;  Location: [facility] ENDOSCOPY;  Service: Endoscopy;  Laterality: N/A;  . HAND SURGERY Left 06/26/2011   Malignancy removed from left hand  . NASAL SEPTUM SURGERY  1986  . NASAL SINUS SURGERY  1990  . penile inplant  1984  . polyp of rectum  2003  . PROSTATE SURGERY  1975   Abdominal, had to have radiation treatment with the procedure  . PROSTATE SURGERY    . TONSILLECTOMY    . TRANSURETHRAL RESECTION OF BLADDER TUMOR N/A 03/12/2021   Procedure: TRANSURETHRAL RESECTION OF BLADDER TUMOR (TURBT);  Surgeon: Riki Altes, MD;  Location: [facility] ORS;  Service: Urology;  Laterality: N/A;   MEDICATIONS: Prior to Admission medications   Medication Sig Start Date End Date Taking? Authorizing Provider  acetaminophen (TYLENOL) 325 MG tablet Take 2 tablets (650 mg total) by mouth every 6 (six) hours as needed for mild pain (or Fever >/= 101). 12/09/22  Yes Loyce Dys, MD  apixaban (ELIQUIS) 5 MG TABS tablet Take 1 tablet (5 mg total) by mouth 2 (two) times daily. 12/17/22  Yes Djan, Scarlette Calico, MD  Apoaequorin (PREVAGEN) 10 MG CAPS Take 10 mg by mouth daily.  Yes [provider]  ARTIFICIAL TEAR SOLUTION OP Place 1 drop into both eyes 2 (two) times daily as needed (dry eyes).   Yes [provider]  brimonidine (ALPHAGAN) 0.2 % ophthalmic solution Place 1 drop into the left eye 2 (two) times daily. 01/25/21  Yes [provider]  Calcium Carbonate-Vit D-Min (CALCIUM 1200 PO) Take 1,200 mg by mouth daily.   Yes [provider]  cyanocobalamin (VITAMIN B12) 1000 MCG tablet Take 1 tablet (1,000 mcg total) by mouth daily. 11/18/22  Yes Esaw Grandchild A, DO  donepezil (ARICEPT) 10 MG tablet TAKE 1 TABLET BY MOUTH AT BEDTIME Patient taking differently: Take  10 mg by mouth at bedtime. 06/20/21  Yes [name], MD  ferrous sulfate 325 (65 FE) MG tablet Take 1 tablet (325 mg total) by mouth every other day. 01/05/23  Yes [name], MD  fluticasone (FLONASE) 50 MCG/ACT nasal spray Place 2 sprays into both nostrils daily as needed for allergies. 09/25/21  Yes [name], MD  isosorbide mononitrate (IMDUR) 60 MG 24 hr tablet TAKE ONE TABLET EVERY DAY 10/20/22  Yes [name], MD  ketoconazole (NIZORAL) 2 % cream APPLY TO GLANS OF PENIS DAILY 10/08/22  Yes [name], MD  levothyroxine (SYNTHROID) 88 MCG tablet TAKE 1 TABLET EVERY DAY ON EMPTY STOMACHWITH A GLASS OF WATER AT LEAST 30-60 MINBEFORE BREAKFAST 12/24/22  Yes [name], MD  memantine (NAMENDA) 10 MG tablet TAKE 1 TABLET BY MOUTH TWO TIMES DAILY Patient taking differently: Take 10 mg by mouth 2 (two) times daily. 11/19/21  Yes [name], MD  metoprolol succinate (TOPROL-XL) 25 MG 24 hr tablet Take 12.5 mg by mouth daily. 01/25/21  Yes [provider]  MULTIPLE VITAMIN PO Take 1 tablet by mouth daily.   Yes [provider]  NON FORMULARY CPAP (Free Text) - Historical Medication  As directed  Started 22-Sep-1994 Active 09/22/1994  Yes [provider]  PARoxetine (PAXIL) 10 MG tablet Take 10 mg by mouth daily.   Yes [provider]  pravastatin (PRAVACHOL) 40 MG tablet TAKE 1 TABLET BY MOUTH DAILY 12/24/22  Yes [name], MD  PREVIDENT 5000 DRY MOUTH 1.1 % GEL dental gel Place 1 application  onto teeth 2 (two) times daily. 02/19/21  Yes [provider]   ED Course: Pt in Ed *** Vitals:   01/24/23 1900 01/24/23 1901  BP:  (!) 153/51  Pulse:  (!) 52  Resp:  15  SpO2:  100%  Weight: 71.6 kg    No intake/output data recorded. SpO2: 100 % Blood work in ed shows: *** Results for orders placed or performed during the hospital encounter of 01/24/23 (from the past 72 hour(s))  CBG monitoring, ED     Status: None    Collection Time: 01/24/23  6:57 PM  Result Value Ref Range   Glucose-Capillary 96 70 - 99 mg/dL    Comment: Glucose reference range applies only to samples taken after fasting for at least 8 hours.  Lactic acid, plasma     Status: None   Collection Time: 01/24/23  7:53 PM  Result Value Ref Range   Lactic Acid, Venous 1.4 0.5 - 1.9 mmol/L    Comment: Performed at [facility], [address], [place], [place] [address]  Comprehensive metabolic panel     Status: Abnormal   Collection Time: 01/24/23  7:53 PM  Result Value Ref Range   Sodium 140 135 -  145 mmol/L   Potassium 4.4 3.5 - 5.1 mmol/L   Chloride 107 98 - 111 mmol/L   CO2 25 22 - 32 mmol/L   Glucose, Bld 95 70 - 99 mg/dL    Comment: Glucose reference range applies only to samples taken after fasting for at least 8 hours.   BUN 28 (H) 8 - 23 mg/dL   Creatinine, Ser 0.34 (H) 0.61 - 1.24 mg/dL   Calcium 8.7 (L) 8.9 - 10.3 mg/dL   Total Protein 6.8 6.5 - 8.1 g/dL   Albumin 3.9 3.5 - 5.0 g/dL   AST 28 15 - 41 U/L   ALT 20 0 - 44 U/L   Alkaline Phosphatase 49 38 - 126 U/L   Total Bilirubin 0.6 0.3 - 1.2 mg/dL   GFR, Estimated 48 (L) >60 mL/min    Comment: (NOTE) Calculated using the CKD-EPI Creatinine Equation (2021)    Anion gap 8 5 - 15    Comment: Performed at [facility], [address] [place], [place] [address]  CBC with Differential     Status: Abnormal   Collection Time: 01/24/23  7:53 PM  Result Value Ref Range   WBC 9.0 4.0 - 10.5 K/uL   RBC 3.44 (L) 4.22 - 5.81 MIL/uL   Hemoglobin 10.3 (L) 13.0 - 17.0 g/dL   HCT 56.3 (L) 87.5 - 64.3 %   MCV 95.6 80.0 - 100.0 fL   MCH 29.9 26.0 - 34.0 pg   MCHC 31.3 30.0 - 36.0 g/dL   RDW 32.9 (H) 51.8 - 84.1 %   Platelets 220 150 - 400 K/uL   nRBC 0.0 0.0 - 0.2 %   Neutrophils Relative % 75 %   Neutro Abs 6.8 1.7 - 7.7 K/uL   Lymphocytes Relative 15 %   Lymphs Abs 1.3 0.7 - 4.0 K/uL   Monocytes Relative 7 %   Monocytes Absolute 0.7 0.1 - 1.0 K/uL    Eosinophils Relative 2 %   Eosinophils Absolute 0.2 0.0 - 0.5 K/uL   Basophils Relative 0 %   Basophils Absolute 0.0 0.0 - 0.1 K/uL   Immature Granulocytes 1 %   Abs Immature Granulocytes 0.05 0.00 - 0.07 K/uL    Comment: Performed at [facility], [address] [place], [place] [address]  Protime-INR     Status: Abnormal   Collection Time: 01/24/23  7:53 PM  Result Value Ref Range   Prothrombin Time 16.7 (H) 11.4 - 15.2 seconds   INR 1.4 (H) 0.8 - 1.2    Comment: (NOTE) INR goal varies based on device and disease states. Performed at [facility], [address] [place], [place] [address]   APTT     Status: None   Collection Time: 01/24/23  7:53 PM  Result Value Ref Range   aPTT 32 24 - 36 seconds    Comment: Performed at [facility], [address] [place], [place] [address]  Urinalysis, w/ Reflex to Culture (Infection Suspected) -Urine, Clean Catch     Status: Abnormal   Collection Time: 01/24/23  7:53 PM  Result Value Ref Range   Specimen Source URINE, CLEAN CATCH    Color, Urine YELLOW (A) YELLOW   APPearance HAZY (A) CLEAR   Specific Gravity, Urine 1.012 1.005 - 1.030   pH 6.0 5.0 - 8.0   Glucose, UA NEGATIVE NEGATIVE mg/dL   Hgb urine dipstick MODERATE (A) NEGATIVE   Bilirubin Urine NEGATIVE NEGATIVE   Ketones, ur NEGATIVE NEGATIVE mg/dL  Protein, ur NEGATIVE NEGATIVE mg/dL   Nitrite NEGATIVE NEGATIVE   Leukocytes,Ua NEGATIVE NEGATIVE   RBC / HPF >50 0 - 5 RBC/hpf   WBC, UA 0-5 0 - 5 WBC/hpf    Comment:        Reflex urine culture not performed if WBC <=10, OR if Squamous epithelial cells >5. If Squamous epithelial cells >5 suggest recollection.    Bacteria, UA NONE SEEN NONE SEEN   Squamous Epithelial / HPF NONE SEEN 0 - 5 /HPF   Mucus PRESENT     Comment: Performed at [facility], [address] [place], [place] [address]  Lactic acid, plasma     Status: None   Collection Time: 01/24/23  9:20 PM  Result Value  Ref Range   Lactic Acid, Venous 1.3 0.5 - 1.9 mmol/L    Comment: Performed at [facility], [address] [place], [place] [address]  Troponin I (High Sensitivity)     Status: None   Collection Time: 01/24/23  9:20 PM  Result Value Ref Range   Troponin I (High Sensitivity) 8 <18 ng/L    Comment: (NOTE) Elevated high sensitivity troponin I (hsTnI) values and significant  changes across serial measurements may suggest ACS but many other  chronic and acute conditions are known to elevate hsTnI results.  Refer to the "Links" section for chest pain algorithms and additional  guidance. Performed at [facility], [address] [place], [place] [address]     Lab Results  Component Value Date   CREATININE 1.37 (H) 01/24/2023   CREATININE 1.26 (H) 12/21/2022   CREATININE 1.34 (H) 12/20/2022      Latest Ref Rng & Units 01/24/2023    7:53 PM 12/21/2022    3:34 AM 12/20/2022    3:32 AM  CMP  Glucose 70 - 99 mg/dL 95  130  95   BUN 8 - 23 mg/dL 28  25  23    Creatinine 0.61 - 1.24 mg/dL 8.65  7.84  6.96   Sodium 135 - 145 mmol/L 140  138  139   Potassium 3.5 - 5.1 mmol/L 4.4  3.8  3.9   Chloride 98 - 111 mmol/L 107  110  108   CO2 22 - 32 mmol/L 25  21  20    Calcium 8.9 - 10.3 mg/dL 8.7  8.5  8.5   Total Protein 6.5 - 8.1 g/dL 6.8     Total Bilirubin 0.3 - 1.2 mg/dL 0.6     Alkaline Phos 38 - 126 U/L 49     AST 15 - 41 U/L 28     ALT 0 - 44 U/L 20      Unresulted Labs (From admission, onward)     Start     Ordered   01/24/23 1902  Blood Culture (routine x 2)  (Undifferentiated presentation (screening labs and basic nursing orders))  BLOOD CULTURE X 2,   STAT      01/24/23 1902           Pt has received : Orders Placed This Encounter  Procedures  . Blood Culture (routine x 2)    Standing Status:   Standing    Number of Occurrences:   2  . CT Head Wo Contrast    Standing Status:   Standing    Number of Occurrences:   1  . DG Chest Port 1 View     Standing Status:   Standing    Number of Occurrences:  1    Order Specific Question:   Reason for Exam (SYMPTOM  OR DIAGNOSIS REQUIRED)    Answer:   Questionable sepsis - evaluate for abnormality  . Lactic acid, plasma    Standing Status:   Standing    Number of Occurrences:   2  . Comprehensive metabolic panel    Standing Status:   Standing    Number of Occurrences:   1  . CBC with Differential    Standing Status:   Standing    Number of Occurrences:   1  . Protime-INR    Standing Status:   Standing    Number of Occurrences:   1  . APTT    Standing Status:   Standing    Number of Occurrences:   1  . Urinalysis, w/ Reflex to Culture (Infection Suspected) -Urine, Clean Catch    Standing Status:   Standing    Number of Occurrences:   1    Order Specific Question:   Specimen Source    Answer:   Urine, Clean Catch [[id number]]  . Diet NPO time specified    Standing Status:   Standing    Number of Occurrences:   1  . Document height and weight    Standing Status:   Standing    Number of Occurrences:   1  . Assess and Document Glasgow Coma Scale    Standing Status:   Standing    Number of Occurrences:   1  . Document vital signs within 1-hour of fluid bolus completion.  Notify provider of abnormal vital signs despite fluid resuscitation.    Standing Status:   Standing    Number of Occurrences:   1  . Refer to Sidebar Report: Sepsis Bundle ED/IP    Sepsis Bundle ED/IP    Standing Status:   Standing    Number of Occurrences:   1  . Notify provider for difficulties obtaining IV access    Standing Status:   Standing    Number of Occurrences:   1  . Initiate Carrier Fluid Protocol    Standing Status:   Standing    Number of Occurrences:   1  . Consult to hospitalist    Standing Status:   Standing    Number of Occurrences:   1    Order Specific Question:   Place call to:    Answer:   hospitalist    Order Specific Question:   Reason for Consult    Answer:   Consult    Order Specific  Question:   Diagnosis/Clinical Info for Consult:    Answer:   syncopal episode  . CBG monitoring, ED    Standing Status:   Standing    Number of Occurrences:   1  . ED EKG 12-Lead    Standing Status:   Standing    Number of Occurrences:   1    Order Specific Question:   Notes    Answer:   Baseline  . Insert peripheral IV X 1    Angiocath size 20G or larger    Standing Status:   Standing    Number of Occurrences:   1    No orders of the defined types were placed in this encounter.   Admission Imaging : CT Head Wo Contrast  Result Date: 01/24/2023 CLINICAL DATA:  Bradycardia and altered mental status. EXAM: CT HEAD WITHOUT CONTRAST TECHNIQUE: Contiguous axial images were obtained from the base of the skull through the vertex  without intravenous contrast. RADIATION DOSE REDUCTION: This exam was performed according to the departmental dose-optimization program which includes automated exposure control, adjustment of the mA and/or kV according to patient size and/or use of iterative reconstruction technique. COMPARISON:  Head CT without contrast 12/19/2022 FINDINGS: Brain: There is moderately advanced cerebral atrophy, small-vessel disease and atrophic ventriculomegaly commensurate with age. Relatively mild cerebellar atrophy. There are tiny chronic bilateral gangliocapsular lacunar infarcts and another small chronic lacunar infarct in the left thalamus. There is no midline shift. No new asymmetry is seen worrisome for acute infarct, hemorrhage or mass. Basal cisterns are clear. Vascular: There are calcifications in the carotid siphons and distal vertebral arteries. No hyperdense central vessel is seen. Skull: Negative for fractures or focal lesions. Sinuses/Orbits: Old sinonasal surgical changes. Complete opacification of the frontal sinuses is again noted, with interval resolution of the prior finding of moderate circumferential membrane thickening left maxillary sinus. Other sinuses are clear. Trace  fluid again noted left mastoid tip with no other significant bilateral mastoid disease. Other: None. IMPRESSION: 1. No acute intracranial CT findings or intracranial interval changes. 2. Chronic changes. 3. Sinus disease, again with complete opacification of the frontal sinuses. Electronically Signed   By: Almira Bar M.D.   On: 01/24/2023 20:25   DG Chest Port 1 View  Result Date: 01/24/2023 CLINICAL DATA:  Questionable sepsis EXAM: PORTABLE CHEST 1 VIEW COMPARISON:  12/19/2022 FINDINGS: Linear bibasilar atelectasis. No confluent opacities or effusions. Heart mediastinal contours within normal limits. Aortic atherosclerosis. No acute bony abnormality. IMPRESSION: Bibasilar atelectasis or scarring. No active disease. Electronically Signed   By: Charlett Nose M.D.   On: 01/24/2023 19:39   Physical Examination: Vitals:   01/24/23 1900 01/24/23 1901  BP:  (!) 153/51  Pulse:  (!) 52  Resp:  15  Weight: 71.6 kg   SpO2:  100%  BMI (Calculated): 25.48    Physical Exam Vitals and nursing note reviewed.  Constitutional:      General: He is not in acute distress.    Appearance: Normal appearance. He is not ill-appearing, toxic-appearing or diaphoretic.  HENT:     Head: Normocephalic and atraumatic.     Right Ear: Hearing and external ear normal.     Left Ear: Hearing and external ear normal.     Nose: Nose normal. No nasal deformity.     Mouth/Throat:     Lips: Pink.     Mouth: Mucous membranes are moist.     Tongue: No lesions.     Pharynx: Oropharynx is clear.  Eyes:     Extraocular Movements: Extraocular movements intact.  Cardiovascular:     Rate and Rhythm: Regular rhythm. Bradycardia present.     Pulses: Normal pulses.     Heart sounds: Normal heart sounds.  Pulmonary:     Effort: Pulmonary effort is normal.     Breath sounds: Normal breath sounds.  Abdominal:     General: Bowel sounds are normal. There is no distension.     Palpations: Abdomen is soft. There is no mass.      Tenderness: There is no abdominal tenderness. There is no guarding.     Hernia: No hernia is present.  Musculoskeletal:     Right lower leg: No edema.     Left lower leg: No edema.  Skin:    General: Skin is warm.  Neurological:     General: No focal deficit present.     Mental Status: He is alert and oriented to person,  place, and time.     Cranial Nerves: Cranial nerves 2-12 are intact.     Motor: Motor function is intact.  Psychiatric:        Attention and Perception: Attention normal.        Mood and Affect: Mood normal.        Speech: Speech normal.        Behavior: Behavior normal. Behavior is cooperative.        Cognition and Memory: Cognition normal.     Assessment and Plan: No notes have been filed under this hospital service. Service: Hospitalist       DVT prophylaxis:  ***  Code Status:  ***     01/08/2023    3:10 PM  Advanced Directives  Does Patient Have a Medical Advance Directive? Yes  Type of Estate agent of [place];Living will  Copy of Healthcare Power of Attorney in Chart? No - copy requested    Family Communication:  *** Emergency Contact: Contact Information     Name Relation Home Work Mobile   [place] [phone]  [phone]   welborn,Roger Cook Daughter   [phone]   Sutton,Jennifer Daughter   [phone]       Disposition Plan:  ***  Consults: *** Admission status: ***  Unit / Expected LOS: ***  Gertha Calkin MD [facility]  6 PM- 2 AM. [phone]( Pager )  For questions regarding this patient please use [email or web address] to contact the current [facility] MD.   Bonita Quin may also call [phone] to contact current Assigned [facility] Attending/Consulting MD for this patient.

## 2023-01-24 NOTE — Assessment & Plan Note (Signed)
    Latest Ref Rng & Units 01/24/2023    7:53 PM 12/21/2022    3:34 AM 12/20/2022    6:44 PM  CBC  WBC 4.0 - 10.5 K/uL 9.0  6.9    Hemoglobin 13.0 - 17.0 g/dL 16.1  9.7  09.6   Hematocrit 39.0 - 52.0 % 32.9  30.6    Platelets 150 - 400 K/uL 220  255    H&H is currently stable. Will follow

## 2023-01-24 NOTE — Assessment & Plan Note (Signed)
CPAP per home settings.  

## 2023-01-24 NOTE — Assessment & Plan Note (Signed)
Pt found unresponsive and bradycardic at home. Patient baseline initially.  Later recovered became alert awake arousable oriented in the emergency room. Suspect patient's altered mental status to be related to syncopal presentation due to bradycardia and hypotension possibly or dysrhythmia which we will evaluate after admitting him.

## 2023-01-24 NOTE — Assessment & Plan Note (Signed)
Continue home dose of levothyroxine 88 mcg.

## 2023-01-25 ENCOUNTER — Encounter: Payer: Self-pay | Admitting: Internal Medicine

## 2023-01-25 ENCOUNTER — Other Ambulatory Visit: Payer: Self-pay

## 2023-01-25 DIAGNOSIS — Z7901 Long term (current) use of anticoagulants: Secondary | ICD-10-CM | POA: Diagnosis not present

## 2023-01-25 DIAGNOSIS — N179 Acute kidney failure, unspecified: Secondary | ICD-10-CM | POA: Diagnosis not present

## 2023-01-25 DIAGNOSIS — Z79890 Hormone replacement therapy (postmenopausal): Secondary | ICD-10-CM | POA: Diagnosis not present

## 2023-01-25 DIAGNOSIS — Z515 Encounter for palliative care: Secondary | ICD-10-CM

## 2023-01-25 DIAGNOSIS — N200 Calculus of kidney: Secondary | ICD-10-CM | POA: Diagnosis not present

## 2023-01-25 DIAGNOSIS — Z8249 Family history of ischemic heart disease and other diseases of the circulatory system: Secondary | ICD-10-CM | POA: Diagnosis not present

## 2023-01-25 DIAGNOSIS — E860 Dehydration: Secondary | ICD-10-CM | POA: Diagnosis present

## 2023-01-25 DIAGNOSIS — I129 Hypertensive chronic kidney disease with stage 1 through stage 4 chronic kidney disease, or unspecified chronic kidney disease: Secondary | ICD-10-CM | POA: Diagnosis present

## 2023-01-25 DIAGNOSIS — Z7189 Other specified counseling: Secondary | ICD-10-CM | POA: Diagnosis not present

## 2023-01-25 DIAGNOSIS — I2699 Other pulmonary embolism without acute cor pulmonale: Secondary | ICD-10-CM | POA: Diagnosis not present

## 2023-01-25 DIAGNOSIS — K219 Gastro-esophageal reflux disease without esophagitis: Secondary | ICD-10-CM | POA: Diagnosis present

## 2023-01-25 DIAGNOSIS — N1831 Chronic kidney disease, stage 3a: Secondary | ICD-10-CM | POA: Diagnosis not present

## 2023-01-25 DIAGNOSIS — R001 Bradycardia, unspecified: Secondary | ICD-10-CM | POA: Diagnosis present

## 2023-01-25 DIAGNOSIS — Z87891 Personal history of nicotine dependence: Secondary | ICD-10-CM | POA: Diagnosis not present

## 2023-01-25 DIAGNOSIS — I2510 Atherosclerotic heart disease of native coronary artery without angina pectoris: Secondary | ICD-10-CM | POA: Diagnosis present

## 2023-01-25 DIAGNOSIS — Z79899 Other long term (current) drug therapy: Secondary | ICD-10-CM | POA: Diagnosis not present

## 2023-01-25 DIAGNOSIS — I10 Essential (primary) hypertension: Secondary | ICD-10-CM | POA: Diagnosis not present

## 2023-01-25 DIAGNOSIS — D649 Anemia, unspecified: Secondary | ICD-10-CM | POA: Diagnosis present

## 2023-01-25 DIAGNOSIS — E039 Hypothyroidism, unspecified: Secondary | ICD-10-CM | POA: Diagnosis not present

## 2023-01-25 DIAGNOSIS — I700 Atherosclerosis of aorta: Secondary | ICD-10-CM | POA: Diagnosis not present

## 2023-01-25 DIAGNOSIS — R4182 Altered mental status, unspecified: Secondary | ICD-10-CM | POA: Diagnosis not present

## 2023-01-25 DIAGNOSIS — F0280 Dementia in other diseases classified elsewhere without behavioral disturbance: Secondary | ICD-10-CM | POA: Diagnosis not present

## 2023-01-25 DIAGNOSIS — F0390 Unspecified dementia without behavioral disturbance: Secondary | ICD-10-CM | POA: Diagnosis not present

## 2023-01-25 DIAGNOSIS — D631 Anemia in chronic kidney disease: Secondary | ICD-10-CM | POA: Diagnosis not present

## 2023-01-25 DIAGNOSIS — Z801 Family history of malignant neoplasm of trachea, bronchus and lung: Secondary | ICD-10-CM | POA: Diagnosis not present

## 2023-01-25 DIAGNOSIS — Z86711 Personal history of pulmonary embolism: Secondary | ICD-10-CM | POA: Diagnosis not present

## 2023-01-25 DIAGNOSIS — Z881 Allergy status to other antibiotic agents status: Secondary | ICD-10-CM | POA: Diagnosis not present

## 2023-01-25 DIAGNOSIS — R55 Syncope and collapse: Secondary | ICD-10-CM | POA: Diagnosis not present

## 2023-01-25 DIAGNOSIS — G510 Bell's palsy: Secondary | ICD-10-CM | POA: Diagnosis not present

## 2023-01-25 DIAGNOSIS — Z960 Presence of urogenital implants: Secondary | ICD-10-CM | POA: Diagnosis present

## 2023-01-25 DIAGNOSIS — Z66 Do not resuscitate: Secondary | ICD-10-CM | POA: Diagnosis present

## 2023-01-25 DIAGNOSIS — F32A Depression, unspecified: Secondary | ICD-10-CM | POA: Diagnosis present

## 2023-01-25 DIAGNOSIS — G4733 Obstructive sleep apnea (adult) (pediatric): Secondary | ICD-10-CM | POA: Diagnosis present

## 2023-01-25 DIAGNOSIS — E785 Hyperlipidemia, unspecified: Secondary | ICD-10-CM | POA: Diagnosis not present

## 2023-01-25 DIAGNOSIS — N281 Cyst of kidney, acquired: Secondary | ICD-10-CM | POA: Diagnosis not present

## 2023-01-25 DIAGNOSIS — Z8673 Personal history of transient ischemic attack (TIA), and cerebral infarction without residual deficits: Secondary | ICD-10-CM | POA: Diagnosis not present

## 2023-01-25 DIAGNOSIS — F0393 Unspecified dementia, unspecified severity, with mood disturbance: Secondary | ICD-10-CM | POA: Diagnosis present

## 2023-01-25 DIAGNOSIS — Z8546 Personal history of malignant neoplasm of prostate: Secondary | ICD-10-CM | POA: Diagnosis not present

## 2023-01-25 LAB — CBC
HCT: 30.2 % — ABNORMAL LOW (ref 39.0–52.0)
Hemoglobin: 9.6 g/dL — ABNORMAL LOW (ref 13.0–17.0)
MCH: 29.7 pg (ref 26.0–34.0)
MCHC: 31.8 g/dL (ref 30.0–36.0)
MCV: 93.5 fL (ref 80.0–100.0)
Platelets: 180 10*3/uL (ref 150–400)
RBC: 3.23 MIL/uL — ABNORMAL LOW (ref 4.22–5.81)
RDW: 18.9 % — ABNORMAL HIGH (ref 11.5–15.5)
WBC: 6.1 10*3/uL (ref 4.0–10.5)
nRBC: 0 % (ref 0.0–0.2)

## 2023-01-25 LAB — COMPREHENSIVE METABOLIC PANEL
ALT: 20 U/L (ref 0–44)
AST: 24 U/L (ref 15–41)
Albumin: 3.5 g/dL (ref 3.5–5.0)
Alkaline Phosphatase: 46 U/L (ref 38–126)
Anion gap: 5 (ref 5–15)
BUN: 25 mg/dL — ABNORMAL HIGH (ref 8–23)
CO2: 25 mmol/L (ref 22–32)
Calcium: 8.9 mg/dL (ref 8.9–10.3)
Chloride: 108 mmol/L (ref 98–111)
Creatinine, Ser: 1.21 mg/dL (ref 0.61–1.24)
GFR, Estimated: 56 mL/min — ABNORMAL LOW (ref >60)
Glucose, Bld: 97 mg/dL (ref 70–99)
Potassium: 4.1 mmol/L (ref 3.5–5.1)
Sodium: 138 mmol/L (ref 135–145)
Total Bilirubin: 0.6 mg/dL (ref 0.3–1.2)
Total Protein: 6.1 g/dL — ABNORMAL LOW (ref 6.5–8.1)

## 2023-01-25 LAB — MRSA NEXT GEN BY PCR, NASAL: MRSA by PCR Next Gen: NOT DETECTED

## 2023-01-25 LAB — GLUCOSE, CAPILLARY: Glucose-Capillary: 85 mg/dL (ref 70–99)

## 2023-01-25 LAB — T4, FREE: Free T4: 0.97 ng/dL (ref 0.61–1.12)

## 2023-01-25 LAB — TSH: TSH: 2.417 u[IU]/mL (ref 0.350–4.500)

## 2023-01-25 MED ORDER — DONEPEZIL HCL 5 MG PO TABS
10.0000 mg | ORAL_TABLET | Freq: Every day | ORAL | Status: DC
  Administered 2023-01-25: 10 mg via ORAL
  Filled 2023-01-25: qty 2

## 2023-01-25 MED ORDER — HYDRALAZINE HCL 20 MG/ML IJ SOLN
10.0000 mg | Freq: Four times a day (QID) | INTRAMUSCULAR | Status: DC | PRN
  Administered 2023-01-25 – 2023-01-26 (×2): 10 mg via INTRAVENOUS
  Filled 2023-01-25 (×2): qty 1

## 2023-01-25 MED ORDER — FLUTICASONE PROPIONATE 50 MCG/ACT NA SUSP: 2.0000 | Freq: Every day | NASAL | Status: DC | PRN

## 2023-01-25 MED ORDER — PRAVASTATIN SODIUM 20 MG PO TABS
40.0000 mg | ORAL_TABLET | Freq: Every day | ORAL | Status: DC
  Administered 2023-01-25 – 2023-01-26 (×2): 40 mg via ORAL
  Filled 2023-01-25 (×2): qty 2

## 2023-01-25 MED ORDER — APIXABAN 5 MG PO TABS
5.0000 mg | ORAL_TABLET | Freq: Two times a day (BID) | ORAL | Status: DC
  Administered 2023-01-25 – 2023-01-27 (×5): 5 mg via ORAL
  Filled 2023-01-25 (×5): qty 1

## 2023-01-25 MED ORDER — ADULT MULTIVITAMIN W/MINERALS CH
1.0000 | ORAL_TABLET | Freq: Every day | ORAL | Status: DC
  Administered 2023-01-27: 1 via ORAL
  Filled 2023-01-25: qty 1

## 2023-01-25 MED ORDER — ACETAMINOPHEN 325 MG PO TABS
650.0000 mg | ORAL_TABLET | Freq: Four times a day (QID) | ORAL | Status: DC | PRN
  Administered 2023-01-25: 650 mg via ORAL
  Filled 2023-01-25: qty 2

## 2023-01-25 MED ORDER — MEMANTINE HCL 10 MG PO TABS
10.0000 mg | ORAL_TABLET | Freq: Two times a day (BID) | ORAL | Status: DC
  Administered 2023-01-25 – 2023-01-27 (×5): 10 mg via ORAL
  Filled 2023-01-25 (×5): qty 1

## 2023-01-25 MED ORDER — BRIMONIDINE TARTRATE 0.2 % OP SOLN
1.0000 [drp] | Freq: Two times a day (BID) | OPHTHALMIC | Status: DC
  Administered 2023-01-25 – 2023-01-27 (×5): 1 [drp] via OPHTHALMIC
  Filled 2023-01-25: qty 5

## 2023-01-25 MED ORDER — SODIUM CHLORIDE 0.9% FLUSH
3.0000 mL | Freq: Two times a day (BID) | INTRAVENOUS | Status: DC
  Administered 2023-01-25 – 2023-01-27 (×6): 3 mL via INTRAVENOUS

## 2023-01-25 MED ORDER — ORAL CARE MOUTH RINSE: 15.0000 mL | OROMUCOSAL | Status: DC | PRN

## 2023-01-25 MED ORDER — APOAEQUORIN 10 MG PO CAPS: 10.0000 mg | ORAL_CAPSULE | Freq: Every day | ORAL | Status: DC

## 2023-01-25 MED ORDER — CALCIUM CARBONATE ANTACID 500 MG PO CHEW
600.0000 mg | CHEWABLE_TABLET | Freq: Two times a day (BID) | ORAL | Status: DC
  Administered 2023-01-25 – 2023-01-27 (×4): 600 mg via ORAL
  Filled 2023-01-25 (×4): qty 3

## 2023-01-25 MED ORDER — VITAMIN B-12 1000 MCG PO TABS
1000.0000 ug | ORAL_TABLET | Freq: Every day | ORAL | Status: DC
  Administered 2023-01-25 – 2023-01-27 (×3): 1000 ug via ORAL
  Filled 2023-01-25 (×4): qty 1

## 2023-01-25 MED ORDER — HYDRALAZINE HCL 20 MG/ML IJ SOLN
10.0000 mg | INTRAMUSCULAR | Status: AC
  Administered 2023-01-25: 10 mg via INTRAVENOUS
  Filled 2023-01-25: qty 1

## 2023-01-25 MED ORDER — PAROXETINE HCL 10 MG PO TABS
10.0000 mg | ORAL_TABLET | Freq: Every day | ORAL | Status: DC
  Administered 2023-01-25 – 2023-01-27 (×3): 10 mg via ORAL
  Filled 2023-01-25 (×3): qty 1

## 2023-01-25 MED ORDER — LEVOTHYROXINE SODIUM 88 MCG PO TABS
88.0000 ug | ORAL_TABLET | Freq: Every day | ORAL | Status: DC
  Administered 2023-01-25 – 2023-01-27 (×3): 88 ug via ORAL
  Filled 2023-01-25 (×3): qty 1

## 2023-01-25 MED ORDER — CALCIUM 1200 1200-1000 MG-UNIT PO CHEW: CHEWABLE_TABLET | Freq: Every day | ORAL | Status: DC

## 2023-01-25 MED ORDER — ACETAMINOPHEN 650 MG RE SUPP: 650.0000 mg | Freq: Four times a day (QID) | RECTAL | Status: DC | PRN

## 2023-01-25 MED ORDER — FERROUS SULFATE 325 (65 FE) MG PO TABS
325.0000 mg | ORAL_TABLET | ORAL | Status: DC
  Administered 2023-01-26: 325 mg via ORAL
  Filled 2023-01-25: qty 1

## 2023-01-25 MED ORDER — CHLORHEXIDINE GLUCONATE CLOTH 2 % EX PADS
6.0000 | MEDICATED_PAD | Freq: Every day | CUTANEOUS | Status: DC
  Administered 2023-01-25 – 2023-01-27 (×3): 6 via TOPICAL

## 2023-01-25 NOTE — Progress Notes (Addendum)
0800 Patient very confused. He knows his name,birthday and he is in the hospital but frequently ask does his family know he is here (ie 4 times /hour)  0900 Ate small amount of breakfast. 1000 Patient's daughter in to visit. Patient complained of chest pain. EKG done. 1015 Dr. Gerri Lins in to see patient. No orders received. 1025 Home medications resumed per Dr.Swayze. 1100 Patient still complaining of chest pain but sleeps  intermittently. Given Tylenol for chest pain. 1145 Wife in to visit. Meeting with pallative care set for 1500 1600 Patient now DNR DNI Wife visiting for a short while. Dementia is advanced but orients quickly when family is here. 1800 Remains in NSR. And alert.

## 2023-01-25 NOTE — Progress Notes (Signed)
The patient is a 89 yr old man who was brought the the [facility] ED after a syncopal episode. He was found to be unresponsive and bradycardic at that time. He has had recurrent syncopal episodes for which he has been admitted. He had a cardiac monitor for 3 weeks that was just returned by mail on Friday. All prior workups have not returned a cause for these episodes. Cardiology has been consulted. The patient has been admitted to a telemetry bed by my colleague early this morning. Beta blockers and other rate controlling medications have been held.   On my exam the patient was awake and alert. Heart and lung exam was within normal limits. Extremities were without cyanosis, clubbing, or edema.  The patient's daughter has made the patient a DNR/DNI. Palliative care consult is pending.

## 2023-01-25 NOTE — Assessment & Plan Note (Signed)
Head CT shows: 1. No acute intracranial CT findings or intracranial interval changes. 2. Chronic changes. 3. Sinus disease, again with complete opacification of the frontal sinuses.  2/2 to bradycardia.

## 2023-01-25 NOTE — Plan of Care (Signed)
  Problem: Education: Goal: Knowledge of General Education information will improve Description: Including pain rating scale, medication(s)/side effects and non-pharmacologic comfort measures Outcome: Not Progressing   

## 2023-01-25 NOTE — Progress Notes (Signed)
PHARMACIST - PHYSICIAN ORDER COMMUNICATION  CONCERNING: P&T Medication Policy on Herbal Medications  DESCRIPTION:  This patient's order for:  Apoaequorin  has been noted.  This product(s) is classified as an "herbal" or natural product. Due to a lack of definitive safety studies or FDA approval, nonstandard manufacturing practices, plus the potential risk of unknown drug-drug interactions while on inpatient medications, the Pharmacy and Therapeutics Committee does not permit the use of "herbal" or natural products of this type within [place].   ACTION TAKEN: The pharmacy department is unable to verify this order at this time and your patient has been informed of this safety policy. Please reevaluate patient's clinical condition at discharge and address if the herbal or natural product(s) should be resumed at that time.   

## 2023-01-25 NOTE — Progress Notes (Signed)
Attempted to set patient up on cpap. He said he does not wear mask during sleep and did not want to wear one here. Unit pulled from room.

## 2023-01-25 NOTE — Consult Note (Signed)
Roger Cook is a 89 y.o. male  [id number]  Primary Cardiologist: Dr. Juliann Pares Reason for Consultation: Syncope  HPI: This is a 89 year old white male with a past medical history of coronary artery disease dementia presented to the hospital after passing out.  Patient apparently was bradycardic in the 40s and was hypotensive with blood pressure systolic around 80.  At this time patient appears to be little bit more alert and communicating.  Patient states that he was at home and found himself on the floor.   Review of Systems: No chest pain or palpitation or fluttering   Past Medical History:  Diagnosis Date   Anemia    Aortic atherosclerosis (HCC)    Bell palsy    Bilateral renal cysts    Bladder tumor    CAD (coronary artery disease)    DOE (dyspnea on exertion)    Elevated lactic acid level 11/13/2022   GERD (gastroesophageal reflux disease)    History of angina    History of penile implant    History of prostate cancer    Hyperlipidemia    Hypertension    Hypothyroidism    Lipoma    MRSA bacteremia 02/2011   Nephrolithiasis    OSA on CPAP    Pneumonia    as a teenager    Medications Prior to Admission  Medication Sig Dispense Refill   acetaminophen (TYLENOL) 325 MG tablet Take 2 tablets (650 mg total) by mouth every 6 (six) hours as needed for mild pain (or Fever >/= 101). 20 tablet 0   apixaban (ELIQUIS) 5 MG TABS tablet Take 1 tablet (5 mg total) by mouth 2 (two) times daily. 60 tablet 6   Apoaequorin (PREVAGEN) 10 MG CAPS Take 10 mg by mouth daily.     ARTIFICIAL TEAR SOLUTION OP Place 1 drop into both eyes 2 (two) times daily as needed (dry eyes).     brimonidine (ALPHAGAN) 0.2 % ophthalmic solution Place 1 drop into the left eye 2 (two) times daily.     Calcium Carbonate-Vit D-Min (CALCIUM 1200 PO) Take 1,200 mg by mouth daily.     cyanocobalamin (VITAMIN B12) 1000 MCG tablet Take 1 tablet (1,000 mcg total) by mouth daily. 30 tablet 2   donepezil (ARICEPT)  10 MG tablet TAKE 1 TABLET BY MOUTH AT BEDTIME (Patient taking differently: Take 10 mg by mouth at bedtime.) 90 tablet 4   ferrous sulfate 325 (65 FE) MG tablet Take 1 tablet (325 mg total) by mouth every other day.     fluticasone (FLONASE) 50 MCG/ACT nasal spray Place 2 sprays into both nostrils daily as needed for allergies. 16 g 5   isosorbide mononitrate (IMDUR) 60 MG 24 hr tablet TAKE ONE TABLET EVERY DAY 90 tablet 3   ketoconazole (NIZORAL) 2 % cream APPLY TO GLANS OF PENIS DAILY 30 g 0   levothyroxine (SYNTHROID) 88 MCG tablet TAKE 1 TABLET EVERY DAY ON EMPTY STOMACHWITH A GLASS OF WATER AT LEAST 30-60 MINBEFORE BREAKFAST 90 tablet 4   memantine (NAMENDA) 10 MG tablet TAKE 1 TABLET BY MOUTH TWO TIMES DAILY (Patient taking differently: Take 10 mg by mouth 2 (two) times daily.) 180 tablet 4   metoprolol succinate (TOPROL-XL) 25 MG 24 hr tablet Take 12.5 mg by mouth daily.     MULTIPLE VITAMIN PO Take 1 tablet by mouth daily.     NON FORMULARY CPAP (Free Text) - Historical Medication  As directed  Started 22-Sep-1994 Active  PARoxetine (PAXIL) 10 MG tablet Take 10 mg by mouth daily.     pravastatin (PRAVACHOL) 40 MG tablet TAKE 1 TABLET BY MOUTH DAILY 90 tablet 4   PREVIDENT 5000 DRY MOUTH 1.1 % GEL dental gel Place 1 application  onto teeth 2 (two) times daily.        apixaban  5 mg Oral BID   brimonidine  1 drop Left Eye BID   calcium carbonate  600 mg of elemental calcium Oral BID WC   Chlorhexidine Gluconate Cloth  6 each Topical Daily   cyanocobalamin  1,000 mcg Oral Daily   donepezil  10 mg Oral QHS   [START ON 01/26/2023] ferrous sulfate  325 mg Oral QODAY   levothyroxine  88 mcg Oral Q0600   memantine  10 mg Oral BID   multivitamin with minerals  1 tablet Oral Q lunch   PARoxetine  10 mg Oral Daily   pravastatin  40 mg Oral QHS   sodium chloride flush  3 mL Intravenous Q12H    Infusions:   Allergies  Allergen Reactions   Levofloxacin Swelling    lips swelling    Povidone Iodine Other (See Comments)    Unknown, can not remember   Sulfa Antibiotics     Unknown, can not remember   Benadryl [Diphenhydramine] Rash    Social History   Socioeconomic History   Marital status: Married    Spouse name: [name]   Number of children: 2   Years of education: Not on file   Highest education level: Bachelor's degree (e.g., BA, AB, BS)  Occupational History   Occupation: retired  Tobacco Use   Smoking status: Former    Packs/day: 1.00    Years: 16.00    Additional pack years: 0.00    Total pack years: 16.00    Types: Cigarettes    Quit date: 1964    Years since quitting: 60.3   Smokeless tobacco: Never  Vaping Use   Vaping Use: Never used  Substance and Sexual Activity   Alcohol use: Not Currently    Alcohol/week: 2.0 standard drinks of alcohol    Types: 2 Glasses of wine per week   Drug use: No   Sexual activity: Yes  Other Topics Concern   Not on file  Social History Narrative   Not on file   Social Determinants of Health   Financial Resource Strain: Low Risk  (02/07/2020)   Overall Financial Resource Strain (CARDIA)    Difficulty of Paying Living Expenses: Not hard at all  Food Insecurity: No Food Insecurity (01/25/2023)   Hunger Vital Sign    Worried About Running Out of Food in the Last Year: Never true    Ran Out of Food in the Last Year: Never true  Transportation Needs: No Transportation Needs (01/25/2023)   PRAPARE - Administrator, Civil Service (Medical): No    Lack of Transportation (Non-Medical): No  Physical Activity: Sufficiently Active (02/07/2020)   Exercise Vital Sign    Days of Exercise per Week: 6 days    Minutes of Exercise per Session: 40 min  Stress: No Stress Concern Present (02/07/2020)   [employer] of Occupational Health - Occupational Stress Questionnaire    Feeling of Stress : Not at all  Social Connections: Socially Integrated (02/07/2020)   Social Connection and Isolation Panel [NHANES]     Frequency of Communication with Friends and Family: More than three times a week    Frequency of Social Gatherings  with Friends and Family: Twice a week    Attends Religious Services: More than 4 times per year    Active Member of Clubs or Organizations: Yes    Attends Banker Meetings: More than 4 times per year    Marital Status: Married  Catering manager Violence: Not At Risk (01/25/2023)   Humiliation, Afraid, Rape, and Kick questionnaire    Fear of Current or Ex-Partner: No    Emotionally Abused: No    Physically Abused: No    Sexually Abused: No    Family History  Problem Relation Age of Onset   Lung cancer Mother    Heart disease Father     PHYSICAL EXAM: Vitals:   01/25/23 1800 01/25/23 1806  BP: (!) 184/54 (!) 178/48  Pulse: 72   Resp: (!) 23   Temp:    SpO2: 100%      Intake/Output Summary (Last 24 hours) at 01/25/2023 1912 Last data filed at 01/25/2023 1800 Gross per 24 hour  Intake 910 ml  Output 800 ml  Net 110 ml    General:  Well appearing. No respiratory difficulty HEENT: normal Neck: supple. no JVD. Carotids 2+ bilat; no bruits. No lymphadenopathy or thryomegaly appreciated. Cor: PMI nondisplaced. Regular rate & rhythm. No rubs, gallops or murmurs. Lungs: clear Abdomen: soft, nontender, nondistended. No hepatosplenomegaly. No bruits or masses. Good bowel sounds. Extremities: no cyanosis, clubbing, rash, edema Neuro: alert & oriented x 3, cranial nerves grossly intact. moves all 4 extremities w/o difficulty. Affect pleasant.  ECG: No EKG in chart but EKG from March 16 shows normal sinus rhythm 64 bpm no acute changes.  Monitor shows sinus rhythm 84 bpm.  Results for orders placed or performed during the hospital encounter of 01/24/23 (from the past 24 hour(s))  Lactic acid, plasma     Status: None   Collection Time: 01/24/23  7:53 PM  Result Value Ref Range   Lactic Acid, Venous 1.4 0.5 - 1.9 mmol/L  Comprehensive metabolic panel      Status: Abnormal   Collection Time: 01/24/23  7:53 PM  Result Value Ref Range   Sodium 140 135 - 145 mmol/L   Potassium 4.4 3.5 - 5.1 mmol/L   Chloride 107 98 - 111 mmol/L   CO2 25 22 - 32 mmol/L   Glucose, Bld 95 70 - 99 mg/dL   BUN 28 (H) 8 - 23 mg/dL   Creatinine, Ser 1.61 (H) 0.61 - 1.24 mg/dL   Calcium 8.7 (L) 8.9 - 10.3 mg/dL   Total Protein 6.8 6.5 - 8.1 g/dL   Albumin 3.9 3.5 - 5.0 g/dL   AST 28 15 - 41 U/L   ALT 20 0 - 44 U/L   Alkaline Phosphatase 49 38 - 126 U/L   Total Bilirubin 0.6 0.3 - 1.2 mg/dL   GFR, Estimated 48 (L) >60 mL/min   Anion gap 8 5 - 15  CBC with Differential     Status: Abnormal   Collection Time: 01/24/23  7:53 PM  Result Value Ref Range   WBC 9.0 4.0 - 10.5 K/uL   RBC 3.44 (L) 4.22 - 5.81 MIL/uL   Hemoglobin 10.3 (L) 13.0 - 17.0 g/dL   HCT 09.6 (L) 04.5 - 40.9 %   MCV 95.6 80.0 - 100.0 fL   MCH 29.9 26.0 - 34.0 pg   MCHC 31.3 30.0 - 36.0 g/dL   RDW 81.1 (H) 91.4 - 78.2 %   Platelets 220 150 - 400 K/uL  nRBC 0.0 0.0 - 0.2 %   Neutrophils Relative % 75 %   Neutro Abs 6.8 1.7 - 7.7 K/uL   Lymphocytes Relative 15 %   Lymphs Abs 1.3 0.7 - 4.0 K/uL   Monocytes Relative 7 %   Monocytes Absolute 0.7 0.1 - 1.0 K/uL   Eosinophils Relative 2 %   Eosinophils Absolute 0.2 0.0 - 0.5 K/uL   Basophils Relative 0 %   Basophils Absolute 0.0 0.0 - 0.1 K/uL   Immature Granulocytes 1 %   Abs Immature Granulocytes 0.05 0.00 - 0.07 K/uL  Protime-INR     Status: Abnormal   Collection Time: 01/24/23  7:53 PM  Result Value Ref Range   Prothrombin Time 16.7 (H) 11.4 - 15.2 seconds   INR 1.4 (H) 0.8 - 1.2  APTT     Status: None   Collection Time: 01/24/23  7:53 PM  Result Value Ref Range   aPTT 32 24 - 36 seconds  Blood Culture (routine x 2)     Status: None (Preliminary result)   Collection Time: 01/24/23  7:53 PM   Specimen: BLOOD  Result Value Ref Range   Specimen Description BLOOD BLOOD RIGHT ARM    Special Requests      BOTTLES DRAWN AEROBIC AND  ANAEROBIC Blood Culture results may not be optimal due to an inadequate volume of blood received in culture bottles   Culture      NO GROWTH < 12 HOURS Performed at [facility], [address] [place], [place] [address]    Report Status PENDING   Blood Culture (routine x 2)     Status: None (Preliminary result)   Collection Time: 01/24/23  7:53 PM   Specimen: BLOOD  Result Value Ref Range   Specimen Description BLOOD BLOOD LEFT ARM    Special Requests      BOTTLES DRAWN AEROBIC AND ANAEROBIC Blood Culture adequate volume   Culture      NO GROWTH < 12 HOURS Performed at [facility], [address] [place], [place] [address]    Report Status PENDING   Urinalysis, w/ Reflex to Culture (Infection Suspected) -Urine, Clean Catch     Status: Abnormal   Collection Time: 01/24/23  7:53 PM  Result Value Ref Range   Specimen Source URINE, CLEAN CATCH    Color, Urine YELLOW (A) YELLOW   APPearance HAZY (A) CLEAR   Specific Gravity, Urine 1.012 1.005 - 1.030   pH 6.0 5.0 - 8.0   Glucose, UA NEGATIVE NEGATIVE mg/dL   Hgb urine dipstick MODERATE (A) NEGATIVE   Bilirubin Urine NEGATIVE NEGATIVE   Ketones, ur NEGATIVE NEGATIVE mg/dL   Protein, ur NEGATIVE NEGATIVE mg/dL   Nitrite NEGATIVE NEGATIVE   Leukocytes,Ua NEGATIVE NEGATIVE   RBC / HPF >50 0 - 5 RBC/hpf   WBC, UA 0-5 0 - 5 WBC/hpf   Bacteria, UA NONE SEEN NONE SEEN   Squamous Epithelial / HPF NONE SEEN 0 - 5 /HPF   Mucus PRESENT   Lactic acid, plasma     Status: None   Collection Time: 01/24/23  9:20 PM  Result Value Ref Range   Lactic Acid, Venous 1.3 0.5 - 1.9 mmol/L  Troponin I (High Sensitivity)     Status: None   Collection Time: 01/24/23  9:20 PM  Result Value Ref Range   Troponin I (High Sensitivity) 8 <18 ng/L  Comprehensive metabolic panel     Status: Abnormal   Collection Time: 01/25/23  2:05  AM  Result Value Ref Range   Sodium 138 135 - 145 mmol/L   Potassium 4.1 3.5 - 5.1 mmol/L   Chloride  108 98 - 111 mmol/L   CO2 25 22 - 32 mmol/L   Glucose, Bld 97 70 - 99 mg/dL   BUN 25 (H) 8 - 23 mg/dL   Creatinine, Ser 1.91 0.61 - 1.24 mg/dL   Calcium 8.9 8.9 - 47.8 mg/dL   Total Protein 6.1 (L) 6.5 - 8.1 g/dL   Albumin 3.5 3.5 - 5.0 g/dL   AST 24 15 - 41 U/L   ALT 20 0 - 44 U/L   Alkaline Phosphatase 46 38 - 126 U/L   Total Bilirubin 0.6 0.3 - 1.2 mg/dL   GFR, Estimated 56 (L) >60 mL/min   Anion gap 5 5 - 15  CBC     Status: Abnormal   Collection Time: 01/25/23  2:05 AM  Result Value Ref Range   WBC 6.1 4.0 - 10.5 K/uL   RBC 3.23 (L) 4.22 - 5.81 MIL/uL   Hemoglobin 9.6 (L) 13.0 - 17.0 g/dL   HCT 29.5 (L) 62.1 - 30.8 %   MCV 93.5 80.0 - 100.0 fL   MCH 29.7 26.0 - 34.0 pg   MCHC 31.8 30.0 - 36.0 g/dL   RDW 65.7 (H) 84.6 - 96.2 %   Platelets 180 150 - 400 K/uL   nRBC 0.0 0.0 - 0.2 %  TSH     Status: None   Collection Time: 01/25/23  2:05 AM  Result Value Ref Range   TSH 2.417 0.350 - 4.500 uIU/mL  T4, free     Status: None   Collection Time: 01/25/23  2:05 AM  Result Value Ref Range   Free T4 0.97 0.61 - 1.12 ng/dL  Glucose, capillary     Status: None   Collection Time: 01/25/23  4:50 AM  Result Value Ref Range   Glucose-Capillary 85 70 - 99 mg/dL  MRSA Next Gen by PCR, Nasal     Status: None   Collection Time: 01/25/23  4:54 AM   Specimen: Nasal Mucosa; Nasal Swab  Result Value Ref Range   MRSA by PCR Next Gen NOT DETECTED NOT DETECTED   CT Head Wo Contrast  Result Date: 01/24/2023 CLINICAL DATA:  Bradycardia and altered mental status. EXAM: CT HEAD WITHOUT CONTRAST TECHNIQUE: Contiguous axial images were obtained from the base of the skull through the vertex without intravenous contrast. RADIATION DOSE REDUCTION: This exam was performed according to the departmental dose-optimization program which includes automated exposure control, adjustment of the mA and/or kV according to patient size and/or use of iterative reconstruction technique. COMPARISON:  Head CT without  contrast 12/19/2022 FINDINGS: Brain: There is moderately advanced cerebral atrophy, small-vessel disease and atrophic ventriculomegaly commensurate with age. Relatively mild cerebellar atrophy. There are tiny chronic bilateral gangliocapsular lacunar infarcts and another small chronic lacunar infarct in the left thalamus. There is no midline shift. No new asymmetry is seen worrisome for acute infarct, hemorrhage or mass. Basal cisterns are clear. Vascular: There are calcifications in the carotid siphons and distal vertebral arteries. No hyperdense central vessel is seen. Skull: Negative for fractures or focal lesions. Sinuses/Orbits: Old sinonasal surgical changes. Complete opacification of the frontal sinuses is again noted, with interval resolution of the prior finding of moderate circumferential membrane thickening left maxillary sinus. Other sinuses are clear. Trace fluid again noted left mastoid tip with no other significant bilateral mastoid disease. Other: None. IMPRESSION: 1. No  acute intracranial CT findings or intracranial interval changes. 2. Chronic changes. 3. Sinus disease, again with complete opacification of the frontal sinuses. Electronically Signed   By: Almira Bar M.D.   On: 01/24/2023 20:25   DG Chest Port 1 View  Result Date: 01/24/2023 CLINICAL DATA:  Questionable sepsis EXAM: PORTABLE CHEST 1 VIEW COMPARISON:  12/19/2022 FINDINGS: Linear bibasilar atelectasis. No confluent opacities or effusions. Heart mediastinal contours within normal limits. Aortic atherosclerosis. No acute bony abnormality. IMPRESSION: Bibasilar atelectasis or scarring. No active disease. Electronically Signed   By: Charlett Nose M.D.   On: 01/24/2023 19:39     ASSESSMENT AND PLAN: Syncope due to severe bradycardia reported in the  40s along with hypotension.  Metoprolol and isosorbide has been on hold.  Patient was started on hydralazine with blood pressure right now 170/50 and heart rate between 70 and 80.   Patient apparently had a monitor which was returned in the office because of prior history of syncope will be looked at to decide whether he needs a pacemaker.  Recently was admitted with bilateral pulmonary embolism and was started on Eliquis.  Continue Eliquis.  Agree with giving hydralazine to control blood pressure for now.  Thank you very much for follow-up.  Raychel Dowler Welton Flakes

## 2023-01-25 NOTE — Consult Note (Signed)
Consultation Note Date: 01/25/2023   Patient Name: Roger Cook  DOB: 01/05/1927  MRN: [id number]  Age / Sex: 89 y.o., male  PCP: [name], MD Referring Physician: Fran Lowes, DO  Reason for Consultation: Establishing goals of care   HPI/Brief Hospital Course: 89 y.o. male  with past medical history of HTN, dementia, HLD, hypothyroidism, TIA and prostate cancer admitted on from home 01/24/2023 after being found unresponsive by his wife.  Noted 4 IP admission in 6 months  Recently d/c'd 3/31 with similar syncopal episode, admission 3/19 for bilateral PE   Has been wearing cardiac monitoring for review of arrhythmias since last admission due to recurrent syncopal episodes, recently removed-Friday and sent in for review  Palliative medicine was consulted for assisting with goals of care conversations  Subjective:  Extensive chart review has been completed prior to meeting patient including labs, vital signs, imaging, progress notes, orders, and available advanced directive documents from current and previous encounters.  Introduced myself as a Publishing rights manager as a member of the palliative care team. Explained palliative medicine is specialized medical care for people living with serious illness. It focuses on providing relief from the symptoms and stress of a serious illness. The goal is to improve quality of life for both the patient and the family.   Visited with [name] at his bedside. Awake and alert, remains pleasantly confused, unable to appropriately answer orientation questions.  Met with daughter-[name] and wife-Barbara, requested to meet outside of room in conference area. [name] shares she and [name] have been married for over 40 years. [name] is [name] only living biological child-he recently lost his other daughter a few months prior. He has step-children with [name]. [name] retired from [employer]. He and  [name] loved to travel.  [name] shares she is [name] primary caregiver in the home. They recently, since last admission have had HH nurses and assistance coming into their home. She shares the for some time [name] has little activity throughout the day. He spends most of his time in his chair resting. He requires her assistance with ambulating, transferring and completing ADL's. She shares she has noticed an ongoing decline in [name] functional status and mentation for several months. His appetite remains stable without recent weight loss.  We discussed expected/anticipated disease trajectory of dementia. Anticipated progression of loss of ongoing loss of function and worsening mentation. Appreciative of education as they were unaware of expectations.  Reviewed Living Will document available on [name]'s cell phone-request for copy to be brought in. [name] has documented he wishes for a natural death and would not want to be kept alive artificially.  Discussed Code Status-Full Code versus Do Not Resuscitate. Encouraged family to consider DNR/DNI status understanding evidenced based poor outcomes in similar hospitalized patients, as the cause of the arrest is likely associated with chronic/terminal disease rather than a reversible acute cardio-pulmonary event. [name] and [name] in agreement with DNR. Clarified DNI-[name] and [name] in agreement for no intubation in the event of respiratory distress or [name] unable to protect his own airway. DNR form completed, placed in chart and will be uploaded to EMR.  [name] and [name] remain hopeful to be provided answers from cardiology regarding recurrent syncopal episodes. [name] and [name] both remain hopeful for a meaningful recovery and being able to return home with [name]. Needed time for outcomes.  I discussed importance of continued conversations with family/support persons and all members of their medical  team regarding overall plan  of care and treatment options ensuring decisions are in alignment with patients goals of care.  All questions/concerns addressed. Emotional support provided to patient/family/support persons. PMT will continue to follow and support patient as needed.  Objective: Primary Diagnoses: Present on Admission:  Bilateral pulmonary embolism (HCC)  Chronic anemia  Chronic kidney disease, stage 3a (HCC)  Dementia (HCC)  Essential hypertension  Hypothyroidism  Severe obstructive sleep apnea  Syncope  AMS (altered mental status)  Sinus bradycardia    Vital Signs: BP (!) 184/54   Pulse 72   Temp 98.4 F (36.9 C)   Resp (!) 23   Ht 5\' 6"  (1.676 m)   Wt 66.4 kg   SpO2 100%   BMI 23.63 kg/m  Pain Scale: 0-10   Pain Score: 0-No pain  IO: Intake/output summary:  Intake/Output Summary (Last 24 hours) at 01/25/2023 1809 Last data filed at 01/25/2023 1800 Gross per 24 hour  Intake 910 ml  Output 800 ml  Net 110 ml    LBM: Last BM Date :  (PTA) Baseline Weight: Weight: 71.6 kg Most recent weight: Weight: 66.4 kg       Assessment and Plan  SUMMARY OF RECOMMENDATIONS   DNR/DNI Time for outcomes Ongoing GOC needed PMT to continue to follow for ongoing needs and support  Discussed With: Nursing staff and primary team   Thank you for this consult and allowing Palliative Medicine to participate in the care of [name]. Palliative medicine will continue to follow and assist as needed.   Time Total: 75 minutes  Time spent includes: Detailed review of medical records (labs, imaging, vital signs), medically appropriate exam (mental status, respiratory, cardiac, skin), discussed with treatment team, counseling and educating patient, family and staff, documenting clinical information, medication management and coordination of care.   Signed by: Leeanne Deed, DNP, AGNP-C Palliative Medicine    Please contact Palliative Medicine Team phone at [phone] for questions and  concerns.  For individual provider: See Loretha Stapler

## 2023-01-25 NOTE — H&P (Signed)
History and Physical     Patient: Roger Cook AYT:[id number] DOB: 1926/11/27 DOA: 01/24/2023 DOS: the patient was seen and examined on 01/25/2023 PCP: [name], MD   Patient coming from: Home  Chief Complaint: AMS Cardiology : [facility]: Dr. Juliann Pares.   HISTORY OF PRESENT ILLNESS: Roger Cook is an 89 y.o. male found unresponsive and bradycardic.  HPI is limited secondary to age and history of dementia. On EMS eval was unresponsive bradycardic hypotensive. Patient has recent admissions for syncopal episodes and also found to have pulmonary embolism.  Past Medical History:  Diagnosis Date   Anemia    Aortic atherosclerosis (HCC)    Bell palsy    Bilateral renal cysts    Bladder tumor    CAD (coronary artery disease)    DOE (dyspnea on exertion)    Elevated lactic acid level 11/13/2022   GERD (gastroesophageal reflux disease)    History of angina    History of penile implant    History of prostate cancer    Hyperlipidemia    Hypertension    Hypothyroidism    Lipoma    MRSA bacteremia 02/2011   Nephrolithiasis    OSA on CPAP    Pneumonia    as a teenager   Review of Systems  Neurological:  Positive for syncope.   Allergies  Allergen Reactions   Levofloxacin Swelling    lips swelling   Povidone Iodine Other (See Comments)    Unknown, can not remember   Sulfa Antibiotics     Unknown, can not remember   Benadryl [Diphenhydramine] Rash   Past Surgical History:  Procedure Laterality Date   APPENDECTOMY  2005   CARDIAC CATHETERIZATION N/A 08/13/1990   75% pLCx, 75% mLCx, 25% mLAD, 75% D2, 50% right renal artery; Location: [facility]; Surgeon: Eugenia Pancoast, MD   CATARACT EXTRACTION  2005   also had a macular hole in 2005   CATARACT EXTRACTION  2008   COLONOSCOPY     2003, 2014   COLONOSCOPY WITH PROPOFOL N/A 12/26/2021   Procedure: COLONOSCOPY WITH PROPOFOL;  Surgeon: Midge Minium, MD;  Location: [facility] ENDOSCOPY;  Service: Endoscopy;  Laterality:  N/A;   ESOPHAGOGASTRODUODENOSCOPY     2004, 2014   ESOPHAGOGASTRODUODENOSCOPY (EGD) WITH PROPOFOL N/A 12/26/2021   Procedure: ESOPHAGOGASTRODUODENOSCOPY (EGD) WITH PROPOFOL;  Surgeon: Midge Minium, MD;  Location: [facility] ENDOSCOPY;  Service: Endoscopy;  Laterality: N/A;   ESOPHAGOGASTRODUODENOSCOPY (EGD) WITH PROPOFOL N/A 11/15/2022   Procedure: ESOPHAGOGASTRODUODENOSCOPY (EGD) WITH PROPOFOL;  Surgeon: Regis Bill, MD;  Location: [facility] ENDOSCOPY;  Service: Endoscopy;  Laterality: N/A;   HAND SURGERY Left 06/26/2011   Malignancy removed from left hand   NASAL SEPTUM SURGERY  1986   NASAL SINUS SURGERY  1990   penile inplant  1984   polyp of rectum  2003   PROSTATE SURGERY  1975   Abdominal, had to have radiation treatment with the procedure   PROSTATE SURGERY     TONSILLECTOMY     TRANSURETHRAL RESECTION OF BLADDER TUMOR N/A 03/12/2021   Procedure: TRANSURETHRAL RESECTION OF BLADDER TUMOR (TURBT);  Surgeon: Riki Altes, MD;  Location: [facility] ORS;  Service: Urology;  Laterality: N/A;   MEDICATIONS: Prior to Admission medications   Medication Sig Start Date End Date Taking? Authorizing Provider  acetaminophen (TYLENOL) 325 MG tablet Take 2 tablets (650 mg total) by mouth every 6 (six) hours as needed for mild pain (or Fever >/= 101). 12/09/22  Yes Loyce Dys, MD  apixaban Everlene Balls)  5 MG TABS tablet Take 1 tablet (5 mg total) by mouth 2 (two) times daily. 12/17/22  Yes Djan, Scarlette Calico, MD  Apoaequorin (PREVAGEN) 10 MG CAPS Take 10 mg by mouth daily.   Yes [provider]  ARTIFICIAL TEAR SOLUTION OP Place 1 drop into both eyes 2 (two) times daily as needed (dry eyes).   Yes [provider]  brimonidine (ALPHAGAN) 0.2 % ophthalmic solution Place 1 drop into the left eye 2 (two) times daily. 01/25/21  Yes [provider]  Calcium Carbonate-Vit D-Min (CALCIUM 1200 PO) Take 1,200 mg by mouth daily.   Yes [provider]  cyanocobalamin (VITAMIN B12) 1000 MCG  tablet Take 1 tablet (1,000 mcg total) by mouth daily. 11/18/22  Yes Esaw Grandchild A, DO  donepezil (ARICEPT) 10 MG tablet TAKE 1 TABLET BY MOUTH AT BEDTIME Patient taking differently: Take 10 mg by mouth at bedtime. 06/20/21  Yes [name], MD  ferrous sulfate 325 (65 FE) MG tablet Take 1 tablet (325 mg total) by mouth every other day. 01/05/23  Yes [name], MD  fluticasone (FLONASE) 50 MCG/ACT nasal spray Place 2 sprays into both nostrils daily as needed for allergies. 09/25/21  Yes [name], MD  isosorbide mononitrate (IMDUR) 60 MG 24 hr tablet TAKE ONE TABLET EVERY DAY 10/20/22  Yes [name], MD  ketoconazole (NIZORAL) 2 % cream APPLY TO GLANS OF PENIS DAILY 10/08/22  Yes [name], MD  levothyroxine (SYNTHROID) 88 MCG tablet TAKE 1 TABLET EVERY DAY ON EMPTY STOMACHWITH A GLASS OF WATER AT LEAST 30-60 MINBEFORE BREAKFAST 12/24/22  Yes [name], MD  memantine (NAMENDA) 10 MG tablet TAKE 1 TABLET BY MOUTH TWO TIMES DAILY Patient taking differently: Take 10 mg by mouth 2 (two) times daily. 11/19/21  Yes [name], MD  metoprolol succinate (TOPROL-XL) 25 MG 24 hr tablet Take 12.5 mg by mouth daily. 01/25/21  Yes [provider]  MULTIPLE VITAMIN PO Take 1 tablet by mouth daily.   Yes [provider]  NON FORMULARY CPAP (Free Text) - Historical Medication  As directed  Started 22-Sep-1994 Active 09/22/1994  Yes [provider]  PARoxetine (PAXIL) 10 MG tablet Take 10 mg by mouth daily.   Yes [provider]  pravastatin (PRAVACHOL) 40 MG tablet TAKE 1 TABLET BY MOUTH DAILY 12/24/22  Yes [name], MD  PREVIDENT 5000 DRY MOUTH 1.1 % GEL dental gel Place 1 application  onto teeth 2 (two) times daily. 02/19/21  Yes [provider]   ED Course: Pt in Ed is alert awake and oriented.  Vitals:   01/25/23 0100 01/25/23 0130 01/25/23 0329 01/25/23 0508  BP: (!) 150/54 (!) 149/49  (!) 212/65  Pulse: (!) 45 (!) 48     Resp: 15 14    Temp:   (!) 97.4 F (36.3 C)   TempSrc:   Oral   SpO2: 99% 97%    Weight:       No intake/output data recorded. SpO2: 97 % Blood work in ed shows: Baseline CKD with creatinine of 1.37.  Normal CMP otherwise. Lactic acid within normal limits. Troponin within normal limits. CBC shows baseline anemia that is chronic with a hemoglobin of 10.3.   Results for orders placed or performed during the hospital encounter of 01/24/23 (from the past 72 hour(s))  CBG monitoring, ED     Status: None   Collection Time: 01/24/23  6:57 PM  Result Value  Ref Range   Glucose-Capillary 96 70 - 99 mg/dL    Comment: Glucose reference range applies only to samples taken after fasting for at least 8 hours.  Lactic acid, plasma     Status: None   Collection Time: 01/24/23  7:53 PM  Result Value Ref Range   Lactic Acid, Venous 1.4 0.5 - 1.9 mmol/L    Comment: Performed at [facility], [address] [place], [place] [address]  Comprehensive metabolic panel     Status: Abnormal   Collection Time: 01/24/23  7:53 PM  Result Value Ref Range   Sodium 140 135 - 145 mmol/L   Potassium 4.4 3.5 - 5.1 mmol/L   Chloride 107 98 - 111 mmol/L   CO2 25 22 - 32 mmol/L   Glucose, Bld 95 70 - 99 mg/dL    Comment: Glucose reference range applies only to samples taken after fasting for at least 8 hours.   BUN 28 (H) 8 - 23 mg/dL   Creatinine, Ser 6.04 (H) 0.61 - 1.24 mg/dL   Calcium 8.7 (L) 8.9 - 10.3 mg/dL   Total Protein 6.8 6.5 - 8.1 g/dL   Albumin 3.9 3.5 - 5.0 g/dL   AST 28 15 - 41 U/L   ALT 20 0 - 44 U/L   Alkaline Phosphatase 49 38 - 126 U/L   Total Bilirubin 0.6 0.3 - 1.2 mg/dL   GFR, Estimated 48 (L) >60 mL/min    Comment: (NOTE) Calculated using the CKD-EPI Creatinine Equation (2021)    Anion gap 8 5 - 15    Comment: Performed at [facility], [address] [place], [place] [address]  CBC with Differential     Status: Abnormal   Collection Time: 01/24/23  7:53 PM   Result Value Ref Range   WBC 9.0 4.0 - 10.5 K/uL   RBC 3.44 (L) 4.22 - 5.81 MIL/uL   Hemoglobin 10.3 (L) 13.0 - 17.0 g/dL   HCT 11.9 (L) 14.7 - 82.9 %   MCV 95.6 80.0 - 100.0 fL   MCH 29.9 26.0 - 34.0 pg   MCHC 31.3 30.0 - 36.0 g/dL   RDW 56.2 (H) 13.0 - 86.5 %   Platelets 220 150 - 400 K/uL   nRBC 0.0 0.0 - 0.2 %   Neutrophils Relative % 75 %   Neutro Abs 6.8 1.7 - 7.7 K/uL   Lymphocytes Relative 15 %   Lymphs Abs 1.3 0.7 - 4.0 K/uL   Monocytes Relative 7 %   Monocytes Absolute 0.7 0.1 - 1.0 K/uL   Eosinophils Relative 2 %   Eosinophils Absolute 0.2 0.0 - 0.5 K/uL   Basophils Relative 0 %   Basophils Absolute 0.0 0.0 - 0.1 K/uL   Immature Granulocytes 1 %   Abs Immature Granulocytes 0.05 0.00 - 0.07 K/uL    Comment: Performed at [facility], [address] [place], [place] [address]  Protime-INR     Status: Abnormal   Collection Time: 01/24/23  7:53 PM  Result Value Ref Range   Prothrombin Time 16.7 (H) 11.4 - 15.2 seconds   INR 1.4 (H) 0.8 - 1.2    Comment: (NOTE) INR goal varies based on device and disease states. Performed at [facility], [address] [place], [place] [address]   APTT     Status: None   Collection Time: 01/24/23  7:53 PM  Result Value Ref Range   aPTT 32 24 - 36 seconds    Comment:  Performed at [facility], [address] [place], [place] [address]  Urinalysis, w/ Reflex to Culture (Infection Suspected) -Urine, Clean Catch     Status: Abnormal   Collection Time: 01/24/23  7:53 PM  Result Value Ref Range   Specimen Source URINE, CLEAN CATCH    Color, Urine YELLOW (A) YELLOW   APPearance HAZY (A) CLEAR   Specific Gravity, Urine 1.012 1.005 - 1.030   pH 6.0 5.0 - 8.0   Glucose, UA NEGATIVE NEGATIVE mg/dL   Hgb urine dipstick MODERATE (A) NEGATIVE   Bilirubin Urine NEGATIVE NEGATIVE   Ketones, ur NEGATIVE NEGATIVE mg/dL   Protein, ur NEGATIVE NEGATIVE mg/dL   Nitrite NEGATIVE NEGATIVE   Leukocytes,Ua  NEGATIVE NEGATIVE   RBC / HPF >50 0 - 5 RBC/hpf   WBC, UA 0-5 0 - 5 WBC/hpf    Comment:        Reflex urine culture not performed if WBC <=10, OR if Squamous epithelial cells >5. If Squamous epithelial cells >5 suggest recollection.    Bacteria, UA NONE SEEN NONE SEEN   Squamous Epithelial / HPF NONE SEEN 0 - 5 /HPF   Mucus PRESENT     Comment: Performed at [facility], [address] [place], [place] [address]  Lactic acid, plasma     Status: None   Collection Time: 01/24/23  9:20 PM  Result Value Ref Range   Lactic Acid, Venous 1.3 0.5 - 1.9 mmol/L    Comment: Performed at [facility], [address] [place], [place] [address]  Troponin I (High Sensitivity)     Status: None   Collection Time: 01/24/23  9:20 PM  Result Value Ref Range   Troponin I (High Sensitivity) 8 <18 ng/L    Comment: (NOTE) Elevated high sensitivity troponin I (hsTnI) values and significant  changes across serial measurements may suggest ACS but many other  chronic and acute conditions are known to elevate hsTnI results.  Refer to the "Links" section for chest pain algorithms and additional  guidance. Performed at [facility], [address] [place], [place] [address]   Comprehensive metabolic panel     Status: Abnormal   Collection Time: 01/25/23  2:05 AM  Result Value Ref Range   Sodium 138 135 - 145 mmol/L   Potassium 4.1 3.5 - 5.1 mmol/L   Chloride 108 98 - 111 mmol/L   CO2 25 22 - 32 mmol/L   Glucose, Bld 97 70 - 99 mg/dL    Comment: Glucose reference range applies only to samples taken after fasting for at least 8 hours.   BUN 25 (H) 8 - 23 mg/dL   Creatinine, Ser 2.95 0.61 - 1.24 mg/dL   Calcium 8.9 8.9 - 62.1 mg/dL   Total Protein 6.1 (L) 6.5 - 8.1 g/dL   Albumin 3.5 3.5 - 5.0 g/dL   AST 24 15 - 41 U/L   ALT 20 0 - 44 U/L   Alkaline Phosphatase 46 38 - 126 U/L   Total Bilirubin 0.6 0.3 - 1.2 mg/dL   GFR, Estimated 56 (L) >60 mL/min    Comment:  (NOTE) Calculated using the CKD-EPI Creatinine Equation (2021)    Anion gap 5 5 - 15    Comment: Performed at [facility], [address] [place], [place] [address]  CBC     Status: Abnormal   Collection Time: 01/25/23  2:05 AM  Result Value Ref Range   WBC 6.1 4.0 - 10.5 K/uL  RBC 3.23 (L) 4.22 - 5.81 MIL/uL   Hemoglobin 9.6 (L) 13.0 - 17.0 g/dL   HCT 16.1 (L) 09.6 - 04.5 %   MCV 93.5 80.0 - 100.0 fL   MCH 29.7 26.0 - 34.0 pg   MCHC 31.8 30.0 - 36.0 g/dL   RDW 40.9 (H) 81.1 - 91.4 %   Platelets 180 150 - 400 K/uL   nRBC 0.0 0.0 - 0.2 %    Comment: Performed at [facility], [address] [place], [place] [address]  TSH     Status: None   Collection Time: 01/25/23  2:05 AM  Result Value Ref Range   TSH 2.417 0.350 - 4.500 uIU/mL    Comment: Performed by a 3rd Generation assay with a functional sensitivity of <=0.01 uIU/mL. Performed at [facility], [address] [place], [place] [address]   T4, free     Status: None   Collection Time: 01/25/23  2:05 AM  Result Value Ref Range   Free T4 0.97 0.61 - 1.12 ng/dL    Comment: (NOTE) Biotin ingestion may interfere with free T4 tests. If the results are inconsistent with the TSH level, previous test results, or the clinical presentation, then consider biotin interference. If needed, order repeat testing after stopping biotin. Performed at [facility], [address] [place], [place] [address]   Glucose, capillary     Status: None   Collection Time: 01/25/23  4:50 AM  Result Value Ref Range   Glucose-Capillary 85 70 - 99 mg/dL    Comment: Glucose reference range applies only to samples taken after fasting for at least 8 hours.    Lab Results  Component Value Date   CREATININE 1.21 01/25/2023   CREATININE 1.37 (H) 01/24/2023   CREATININE 1.26 (H) 12/21/2022      Latest Ref Rng & Units 01/25/2023    2:05 AM 01/24/2023    7:53 PM 12/21/2022    3:34 AM  CMP  Glucose 70 - 99 mg/dL  97  95  469   BUN 8 - 23 mg/dL 25  28  25    Creatinine 0.61 - 1.24 mg/dL 6.29  5.28  4.13   Sodium 135 - 145 mmol/L 138  140  138   Potassium 3.5 - 5.1 mmol/L 4.1  4.4  3.8   Chloride 98 - 111 mmol/L 108  107  110   CO2 22 - 32 mmol/L 25  25  21    Calcium 8.9 - 10.3 mg/dL 8.9  8.7  8.5   Total Protein 6.5 - 8.1 g/dL 6.1  6.8    Total Bilirubin 0.3 - 1.2 mg/dL 0.6  0.6    Alkaline Phos 38 - 126 U/L 46  49    AST 15 - 41 U/L 24  28    ALT 0 - 44 U/L 20  20     Unresulted Labs (From admission, onward)     Start     Ordered   01/25/23 0455  MRSA Next Gen by PCR, Nasal  Once,   R        01/25/23 0454   01/24/23 1902  Blood Culture (routine x 2)  (Undifferentiated presentation (screening labs and basic nursing orders))  BLOOD CULTURE X 2,   STAT      01/24/23 1902           Pt has received : Orders Placed This Encounter  Procedures   Blood Culture (routine x 2)  Standing Status:   Standing    Number of Occurrences:   2   MRSA Next Gen by PCR, Nasal    Standing Status:   Standing    Number of Occurrences:   1   CT Head Wo Contrast    Standing Status:   Standing    Number of Occurrences:   1   DG Chest Port 1 View    Standing Status:   Standing    Number of Occurrences:   1    Order Specific Question:   Reason for Exam (SYMPTOM  OR DIAGNOSIS REQUIRED)    Answer:   Questionable sepsis - evaluate for abnormality   Lactic acid, plasma    Standing Status:   Standing    Number of Occurrences:   2   Comprehensive metabolic panel    Standing Status:   Standing    Number of Occurrences:   1   CBC with Differential    Standing Status:   Standing    Number of Occurrences:   1   Protime-INR    Standing Status:   Standing    Number of Occurrences:   1   APTT    Standing Status:   Standing    Number of Occurrences:   1   Urinalysis, w/ Reflex to Culture (Infection Suspected) -Urine, Clean Catch    Standing Status:   Standing    Number of Occurrences:   1    Order Specific  Question:   Specimen Source    Answer:   Urine, Clean Catch [[id number]]   Comprehensive metabolic panel    Standing Status:   Standing    Number of Occurrences:   1   CBC    Standing Status:   Standing    Number of Occurrences:   1   TSH    Standing Status:   Standing    Number of Occurrences:   1   T4, free    Standing Status:   Standing    Number of Occurrences:   1   Glucose, capillary    Standing Status:   Standing    Number of Occurrences:   1   Diet Heart Room service appropriate? Yes; Fluid consistency: Thin    Standing Status:   Standing    Number of Occurrences:   1    Order Specific Question:   Room service appropriate?    Answer:   Yes    Order Specific Question:   Fluid consistency:    Answer:   Thin   Document height and weight    Standing Status:   Standing    Number of Occurrences:   1   Assess and Document Glasgow Coma Scale    Standing Status:   Standing    Number of Occurrences:   1   Document vital signs within 1-hour of fluid bolus completion.  Notify provider of abnormal vital signs despite fluid resuscitation.    Standing Status:   Standing    Number of Occurrences:   1   Refer to Sidebar Report: Sepsis Bundle ED/IP    Sepsis Bundle ED/IP    Standing Status:   Standing    Number of Occurrences:   1   Notify provider for difficulties obtaining IV access    Standing Status:   Standing    Number of Occurrences:   1   Initiate Carrier Fluid Protocol    Standing Status:   Standing    Number of  Occurrences:   1   Maintain IV access    Standing Status:   Standing    Number of Occurrences:   1   Vital signs    Standing Status:   Standing    Number of Occurrences:   1   Notify physician (specify)    Standing Status:   Standing    Number of Occurrences:   20    Order Specific Question:   Notify Physician    Answer:   for pulse less than 55 or greater than 120    Order Specific Question:   Notify Physician    Answer:   for respiratory rate less than 12 or  greater than 25    Order Specific Question:   Notify Physician    Answer:   for temperature greater than 100.5 F    Order Specific Question:   Notify Physician    Answer:   for urinary output less than 30 mL/hr for four hours    Order Specific Question:   Notify Physician    Answer:   for systolic BP less than 90 or greater than 160, diastolic BP less than 60 or greater than 100    Order Specific Question:   Notify Physician    Answer:   for new hypoxia w/ oxygen saturations < 88%   Daily weights    Standing Status:   Standing    Number of Occurrences:   1   Intake and Output    Standing Status:   Standing    Number of Occurrences:   1   Do not place and if present remove PureWick    Standing Status:   Standing    Number of Occurrences:   1   Initiate Oral Care Protocol    Standing Status:   Standing    Number of Occurrences:   1   Initiate Carrier Fluid Protocol    Standing Status:   Standing    Number of Occurrences:   1   RN may order General Admission PRN Orders utilizing "General Admission PRN medications" (through manage orders) for the following patient needs: allergy symptoms (Claritin), cold sores (Carmex), cough (Robitussin DM), eye irritation (Liquifilm Tears), hemorrhoids (Tucks), indigestion (Maalox), minor skin irritation (Hydrocortisone Cream), muscle pain (Ben Gay), nose irritation (saline nasal spray) and sore throat (Chloraseptic spray).    Standing Status:   Standing    Number of Occurrences:   [phone]   Cardiac Monitoring - Continuous Indefinite    Standing Status:   Standing    Number of Occurrences:   1    Order Specific Question:   Indications for use:    Answer:   ICU/Stepdown patient   Bed rest    Standing Status:   Standing    Number of Occurrences:   1   Swab Process:     1.  Use the double-swab Venturi Transystem Transport Swab (green writing) to collect the specimen. 2.  Insert the dry culture swabs 1-2 cm into the patient's nostril and rotate the  swabs against the inside of the nostril for 3 seconds while applying pressure with a finger to the outside of the nose. 3.  Repeat Step 2 in second nostril with the same swabs. 4.  Return the swabs to the tube. 5.  Label the specimen at the bedside and send immediately to the laboratory.    Standing Status:   Standing    Number of Occurrences:   1   Considerations:  If a patient has had ENT surgery or has facial trauma with packing in place, coordinate with the physician to obtain the nasal swabs.  Other body sites may not be used to screen for MRSA.    Standing Status:   Standing    Number of Occurrences:   1   If MRSA PCR Screen is Positive:     Initiate Methicillin Resistant Staphylococcus aureus (MRSA) PCR Positive Standing Orders.    Standing Status:   Standing    Number of Occurrences:   1   Full code    Standing Status:   Standing    Number of Occurrences:   1    Order Specific Question:   By:    Answer:   Other   Consult to hospitalist    Standing Status:   Standing    Number of Occurrences:   1    Order Specific Question:   Place call to:    Answer:   hospitalist    Order Specific Question:   Reason for Consult    Answer:   Consult    Order Specific Question:   Diagnosis/Clinical Info for Consult:    Answer:   syncopal episode   Pulse oximetry check with vital signs    Standing Status:   Standing    Number of Occurrences:   1   Oxygen therapy Mode or (Route): Nasal cannula; Liters Per Minute: 2; Keep 02 saturation: greater than 92 %    Standing Status:   Standing    Number of Occurrences:   20    Order Specific Question:   Mode or (Route)    Answer:   Nasal cannula    Order Specific Question:   Liters Per Minute    Answer:   2    Order Specific Question:   Keep 02 saturation    Answer:   greater than 92 %   CBG monitoring, ED    Standing Status:   Standing    Number of Occurrences:   1   ED EKG 12-Lead    Standing Status:   Standing    Number of Occurrences:    1    Order Specific Question:   Notes    Answer:   Baseline   Insert peripheral IV X 1    Angiocath size 20G or larger    Standing Status:   Standing    Number of Occurrences:   1   Admit to Inpatient (patient's expected length of stay will be greater than 2 midnights or inpatient only procedure)    Standing Status:   Standing    Number of Occurrences:   1    Order Specific Question:   Hospital Area    Answer:   [facility] [[id number]]    Order Specific Question:   Level of Care    Answer:   Stepdown [14]    Order Specific Question:   Covid Evaluation    Answer:   Asymptomatic - no recent exposure (last 10 days) testing not required    Order Specific Question:   Diagnosis    Answer:   AMS (altered mental status) [[id number]]    Order Specific Question:   Admitting Physician    Answer:   Darrold Junker    Order Specific Question:   Attending Physician    Answer:   Darrold Junker    Order Specific Question:   Certification:    Answer:   I  certify this patient will need inpatient services for at least 2 midnights    Order Specific Question:   Estimated Length of Stay    Answer:   2   Aspiration precautions    Standing Status:   Standing    Number of Occurrences:   1   Fall precautions    Standing Status:   Standing    Number of Occurrences:   1    Meds ordered this encounter  Medications   sodium chloride flush (NS) 0.9 % injection 3 mL   OR Linked Order Group    acetaminophen (TYLENOL) tablet 650 mg    acetaminophen (TYLENOL) suppository 650 mg   hydrALAZINE (APRESOLINE) injection 10 mg    Admission Imaging : CT Head Wo Contrast  Result Date: 01/24/2023 CLINICAL DATA:  Bradycardia and altered mental status. EXAM: CT HEAD WITHOUT CONTRAST TECHNIQUE: Contiguous axial images were obtained from the base of the skull through the vertex without intravenous contrast. RADIATION DOSE REDUCTION: This exam was performed according to the departmental  dose-optimization program which includes automated exposure control, adjustment of the mA and/or kV according to patient size and/or use of iterative reconstruction technique. COMPARISON:  Head CT without contrast 12/19/2022 FINDINGS: Brain: There is moderately advanced cerebral atrophy, small-vessel disease and atrophic ventriculomegaly commensurate with age. Relatively mild cerebellar atrophy. There are tiny chronic bilateral gangliocapsular lacunar infarcts and another small chronic lacunar infarct in the left thalamus. There is no midline shift. No new asymmetry is seen worrisome for acute infarct, hemorrhage or mass. Basal cisterns are clear. Vascular: There are calcifications in the carotid siphons and distal vertebral arteries. No hyperdense central vessel is seen. Skull: Negative for fractures or focal lesions. Sinuses/Orbits: Old sinonasal surgical changes. Complete opacification of the frontal sinuses is again noted, with interval resolution of the prior finding of moderate circumferential membrane thickening left maxillary sinus. Other sinuses are clear. Trace fluid again noted left mastoid tip with no other significant bilateral mastoid disease. Other: None. IMPRESSION: 1. No acute intracranial CT findings or intracranial interval changes. 2. Chronic changes. 3. Sinus disease, again with complete opacification of the frontal sinuses. Electronically Signed   By: Almira Bar M.D.   On: 01/24/2023 20:25   DG Chest Port 1 View  Result Date: 01/24/2023 CLINICAL DATA:  Questionable sepsis EXAM: PORTABLE CHEST 1 VIEW COMPARISON:  12/19/2022 FINDINGS: Linear bibasilar atelectasis. No confluent opacities or effusions. Heart mediastinal contours within normal limits. Aortic atherosclerosis. No acute bony abnormality. IMPRESSION: Bibasilar atelectasis or scarring. No active disease. Electronically Signed   By: Charlett Nose M.D.   On: 01/24/2023 19:39   Physical Examination: Vitals:   01/25/23 0100 01/25/23  0130 01/25/23 0329 01/25/23 0508  BP: (!) 150/54 (!) 149/49  (!) 212/65  Pulse: (!) 45 (!) 48    Temp:   (!) 97.4 F (36.3 C)   Resp: 15 14    Weight:      SpO2: 99% 97%    TempSrc:   Oral   BMI (Calculated):       Physical Exam Vitals and nursing note reviewed.  Constitutional:      General: He is not in acute distress.    Appearance: Normal appearance. He is not ill-appearing, toxic-appearing or diaphoretic.  HENT:     Head: Normocephalic and atraumatic.     Right Ear: Hearing and external ear normal.     Left Ear: Hearing and external ear normal.     Nose: Nose normal. No nasal deformity.  Mouth/Throat:     Lips: Pink.     Mouth: Mucous membranes are moist.     Tongue: No lesions.     Pharynx: Oropharynx is clear.  Eyes:     Extraocular Movements: Extraocular movements intact.  Cardiovascular:     Rate and Rhythm: Regular rhythm. Bradycardia present.     Pulses: Normal pulses.     Heart sounds: Normal heart sounds.  Pulmonary:     Effort: Pulmonary effort is normal.     Breath sounds: Normal breath sounds.  Abdominal:     General: Bowel sounds are normal. There is no distension.     Palpations: Abdomen is soft. There is no mass.     Tenderness: There is no abdominal tenderness. There is no guarding.     Hernia: No hernia is present.  Musculoskeletal:     Right lower leg: No edema.     Left lower leg: No edema.  Skin:    General: Skin is warm.  Neurological:     General: No focal deficit present.     Mental Status: He is alert and oriented to person, place, and time.     Cranial Nerves: Cranial nerves 2-12 are intact.     Motor: Motor function is intact.  Psychiatric:        Attention and Perception: Attention normal.        Mood and Affect: Mood normal.        Speech: Speech normal.        Behavior: Behavior normal. Behavior is cooperative.        Cognition and Memory: Cognition normal.     Assessment and Plan: * AMS (altered mental status) Pt found  unresponsive and bradycardic at home. Patient baseline initially.  Later recovered became alert awake arousable oriented in the emergency room. Suspect patient's altered mental status to be related to syncopal presentation due to bradycardia and hypotension possibly or dysrhythmia which we will evaluate after admitting him.   Bilateral pulmonary embolism (HCC) Will continue patient on Eliquis.   Chronic anemia    Latest Ref Rng & Units 01/24/2023    7:53 PM 12/21/2022    3:34 AM 12/20/2022    6:44 PM  CBC  WBC 4.0 - 10.5 K/uL 9.0  6.9    Hemoglobin 13.0 - 17.0 g/dL 16.1  9.7  09.6   Hematocrit 39.0 - 52.0 % 32.9  30.6    Platelets 150 - 400 K/uL 220  255    H&H is currently stable. Will follow   Sinus bradycardia   Sinus bradycardia and low voltage.  Will continue patient on Eliquis.  Will hold patient's metoprolol.  And Imdur.    Chronic kidney disease, stage 3a (HCC) Lab Results  Component Value Date   CREATININE 1.37 (H) 01/24/2023   CREATININE 1.26 (H) 12/21/2022   CREATININE 1.34 (H) 12/20/2022  .  Kidney function is currently at baseline we will avoid contrast and renally dose all needed medications.   Dementia (HCC) Stable and at baseline we will continue patient's Aricept and Namenda and Paxil.  Hypothyroidism Continue home dose of levothyroxine 88 mcg.   Severe obstructive sleep apnea CPAP per home settings.  Syncope Head CT shows: 1. No acute intracranial CT findings or intracranial interval changes. 2. Chronic changes. 3. Sinus disease, again with complete opacification of the frontal sinuses.  2/2 to bradycardia.   Essential hypertension Vitals:   01/24/23 1901 01/24/23 1915 01/24/23 1930 01/24/23 2330  BP: (!) 153/51 Marland Kitchen)  195/54 (!) 169/52 (!) 191/53  Will hold patient's metoprolol and Imdur. Start patient on scheduled hydralazine for blood pressure control.          DVT prophylaxis:  Eliquis Code Status:  Full code    01/08/2023     3:10 PM  Advanced Directives  Does Patient Have a Medical Advance Directive? Yes  Type of Estate agent of [place];Living will  Copy of Healthcare Power of Attorney in Chart? No - copy requested    Family Communication:  None Emergency Contact: Contact Information     Name Relation Home Work Portal [phone]  [phone]   welborn,patty Daughter   [phone]   Sutton,Jennifer Daughter   [phone]       Disposition Plan:  Telemetry Consults: None Admission status: Inpatient Unit / Expected LOS: Progressive/2 days  Gertha Calkin MD [facility]  6 PM- 2 AM. [phone]( Pager )  For questions regarding this patient please use [email or web address] to contact the current [facility] MD.   Bonita Quin may also call [phone] to contact current Assigned [facility] Attending/Consulting MD for this patient.

## 2023-01-26 ENCOUNTER — Inpatient Hospital Stay: Payer: Medicare Other

## 2023-01-26 DIAGNOSIS — R4182 Altered mental status, unspecified: Secondary | ICD-10-CM | POA: Diagnosis not present

## 2023-01-26 LAB — CBC WITH DIFFERENTIAL/PLATELET
Abs Immature Granulocytes: 0.02 10*3/uL (ref 0.00–0.07)
Basophils Absolute: 0 10*3/uL (ref 0.0–0.1)
Basophils Relative: 1 %
Eosinophils Absolute: 0.1 10*3/uL (ref 0.0–0.5)
Eosinophils Relative: 3 %
HCT: 32.8 % — ABNORMAL LOW (ref 39.0–52.0)
Hemoglobin: 10.4 g/dL — ABNORMAL LOW (ref 13.0–17.0)
Immature Granulocytes: 0 %
Lymphocytes Relative: 28 %
Lymphs Abs: 1.6 10*3/uL (ref 0.7–4.0)
MCH: 29.1 pg (ref 26.0–34.0)
MCHC: 31.7 g/dL (ref 30.0–36.0)
MCV: 91.9 fL (ref 80.0–100.0)
Monocytes Absolute: 0.7 10*3/uL (ref 0.1–1.0)
Monocytes Relative: 12 %
Neutro Abs: 3.2 10*3/uL (ref 1.7–7.7)
Neutrophils Relative %: 56 %
Platelets: 191 10*3/uL (ref 150–400)
RBC: 3.57 MIL/uL — ABNORMAL LOW (ref 4.22–5.81)
RDW: 19.2 % — ABNORMAL HIGH (ref 11.5–15.5)
WBC: 5.6 10*3/uL (ref 4.0–10.5)
nRBC: 0 % (ref 0.0–0.2)

## 2023-01-26 LAB — BASIC METABOLIC PANEL
Anion gap: 8 (ref 5–15)
BUN: 28 mg/dL — ABNORMAL HIGH (ref 8–23)
CO2: 24 mmol/L (ref 22–32)
Calcium: 8.8 mg/dL — ABNORMAL LOW (ref 8.9–10.3)
Chloride: 107 mmol/L (ref 98–111)
Creatinine, Ser: 1.48 mg/dL — ABNORMAL HIGH (ref 0.61–1.24)
GFR, Estimated: 44 mL/min — ABNORMAL LOW (ref >60)
Glucose, Bld: 115 mg/dL — ABNORMAL HIGH (ref 70–99)
Potassium: 3.8 mmol/L (ref 3.5–5.1)
Sodium: 139 mmol/L (ref 135–145)

## 2023-01-26 LAB — PHOSPHORUS: Phosphorus: 4.2 mg/dL (ref 2.5–4.6)

## 2023-01-26 LAB — MAGNESIUM: Magnesium: 2.3 mg/dL (ref 1.7–2.4)

## 2023-01-26 MED ORDER — SODIUM CHLORIDE 0.9 % IV SOLN
INTRAVENOUS | Status: AC
  Administered 2023-01-26: 75 mL/h via INTRAVENOUS

## 2023-01-26 MED ORDER — ISOSORBIDE MONONITRATE ER 30 MG PO TB24
60.0000 mg | ORAL_TABLET | Freq: Every day | ORAL | Status: DC
  Administered 2023-01-26 – 2023-01-27 (×2): 60 mg via ORAL
  Filled 2023-01-26 (×2): qty 2

## 2023-01-26 MED ORDER — AMLODIPINE BESYLATE 5 MG PO TABS
5.0000 mg | ORAL_TABLET | Freq: Every evening | ORAL | Status: DC
  Administered 2023-01-26: 5 mg via ORAL
  Filled 2023-01-26: qty 1

## 2023-01-26 NOTE — Evaluation (Signed)
Physical Therapy Evaluation Patient Details Name: Roger Cook MRN: [id number] DOB: 02-15-1927 Today's Date: 01/26/2023  History of Present Illness  89 year old white male with a past medical history of coronary artery disease dementia presented to the hospital after passing out.  Patient apparently was bradycardic in the 40s and was hypotensive with blood pressure systolic around 80.  At this time patient appears to be little bit more alert and communicating.  Patient states that he was at home and found himself on the floor.  Clinical Impression  Patient admitted with the above. PTA, patient lives with wife who assists him as well as a PCA 4 days/week. Ambulatory with RW at home and receiving [facility] therapy services at this time. Patient presents with weakness, impaired balance, decreased activity tolerance, and decreased safety awareness. Daughter present during session. Able to complete bed mobility with min guard and came into standing with minA. Positive orthostatics, however asymptomatic throughout. RN and MD aware. Patient will benefit from skilled PT services during acute stay to address listed deficits. Patient will benefit from ongoing therapy at discharge to maximize functional independence and safety.      Orthostatic BPs  Supine 123/31  Sitting 110/41  Standing 81/34  Sitting following transfer 105/33      Recommendations for follow up therapy are one component of a multi-disciplinary discharge planning process, led by the attending physician.  Recommendations may be updated based on patient status, additional functional criteria and insurance authorization.  Follow Up Recommendations       Assistance Recommended at Discharge Frequent or constant Supervision/Assistance  Patient can return home with the following  A little help with walking and/or transfers;A little help with bathing/dressing/bathroom;Assistance with cooking/housework;Assist for transportation;Help with stairs or  ramp for entrance    Equipment Recommendations None recommended by PT  Recommendations for Other Services       Functional Status Assessment Patient has had a recent decline in their functional status and demonstrates the ability to make significant improvements in function in a reasonable and predictable amount of time.     Precautions / Restrictions Precautions Precautions: Fall Restrictions Weight Bearing Restrictions: No      Mobility  Bed Mobility Overal bed mobility: Needs Assistance Bed Mobility: Supine to Sit     Supine to sit: Min guard          Transfers Overall transfer level: Needs assistance Equipment used: Rolling Jackquline Branca (2 wheels) Transfers: Sit to/from Stand, Bed to chair/wheelchair/BSC Sit to Stand: Min assist, +2 safety/equipment   Step pivot transfers: Min assist, +2 safety/equipment            Ambulation/Gait                  Stairs            Wheelchair Mobility    Modified Rankin (Stroke Patients Only)       Balance Overall balance assessment: Needs assistance Sitting-balance support: Feet supported Sitting balance-Leahy Scale: Good     Standing balance support: Reliant on assistive device for balance, Bilateral upper extremity supported Standing balance-Leahy Scale: Fair                               Pertinent Vitals/Pain Pain Assessment Pain Assessment: No/denies pain    Home Living Family/patient expects to be discharged to:: Private residence Living Arrangements: Spouse/significant other Available Help at Discharge: Family;Available 24 hours/day;Personal care attendant Type of Home: House Home  Access: Stairs to enter Entrance Stairs-Rails: Doctor, general practice of Steps: 4 Alternate Level Stairs-Number of Steps: flight Home Layout: Two level Home Equipment: Agricultural consultant (2 wheels);BSC/3in1;Shower seat;Hospital bed Additional Comments: Daughter reports, pt lives on main floor  whre hospital bed and [facility] is placed. 1 daughter and son live in [place]. Other daughter lives out of state.    Prior Function Prior Level of Function : Needs assist             Mobility Comments: Pt's daughter present and reported: Pt ambualtes at household level with one person using RW, does not drive ADLs Comments: HH aide 2x/wk for ~4 hours, other hired caregiver comes 1-2x/wk to assist with bathing (due to pt only wanting male to assist with bathing). Receives assistance with all ADLs/IADLs. BSC for toileting. Urinary incontinence at baseline (wears depends). Caregivers also assist with IADLs as needed.     Hand Dominance        Extremity/Trunk Assessment   Upper Extremity Assessment Upper Extremity Assessment: Defer to OT evaluation    Lower Extremity Assessment Lower Extremity Assessment: Generalized weakness       Communication   Communication: No difficulties  Cognition Arousal/Alertness: Awake/alert Behavior During Therapy: WFL for tasks assessed/performed Overall Cognitive Status: History of cognitive impairments - at baseline                                 General Comments: Pt is pleasant and cooperative and follows 1 step commands.        General Comments      Exercises     Assessment/Plan    PT Assessment Patient needs continued PT services  PT Problem List Decreased strength;Decreased activity tolerance;Decreased balance;Decreased mobility;Cardiopulmonary status limiting activity;Decreased safety awareness       PT Treatment Interventions DME instruction;Gait training;Functional mobility training;Therapeutic activities;Therapeutic exercise;Balance training;Patient/family education    PT Goals (Current goals can be found in the Care Plan section)  Acute Rehab PT Goals Patient Stated Goal: to figure things out PT Goal Formulation: With patient/family Time For Goal Achievement: 02/09/23 Potential to Achieve Goals: Good     Frequency Min 3X/week     Co-evaluation PT/OT/SLP Co-Evaluation/Treatment: Yes Reason for Co-Treatment: To address functional/ADL transfers;Complexity of the patient's impairments (multi-system involvement) PT goals addressed during session: Mobility/safety with mobility OT goals addressed during session: ADL's and self-care       AM-PAC PT "6 Clicks" Mobility  Outcome Measure Help needed turning from your back to your side while in a flat bed without using bedrails?: A Little Help needed moving from lying on your back to sitting on the side of a flat bed without using bedrails?: A Little Help needed moving to and from a bed to a chair (including a wheelchair)?: A Little Help needed standing up from a chair using your arms (e.g., wheelchair or bedside chair)?: A Little Help needed to walk in hospital room?: A Little Help needed climbing 3-5 steps with a railing? : A Lot 6 Click Score: 17    End of Session   Activity Tolerance: Patient tolerated treatment well Patient left: in chair;with call bell/phone within reach;with family/visitor present Nurse Communication: Mobility status PT Visit Diagnosis: Unsteadiness on feet (R26.81);Muscle weakness (generalized) (M62.81);Difficulty in walking, not elsewhere classified (R26.2)    Time: 1610-9604 PT Time Calculation (min) (ACUTE ONLY): 19 min   Charges:   PT Evaluation $PT Eval Moderate Complexity: 1 Mod  Maylon Peppers, PT, DPT Physical Therapist - [place]  [facility]   Devon Kingdon A Juliyah Mergen 01/26/2023, 1:52 PM

## 2023-01-26 NOTE — Progress Notes (Signed)
0800 Patient remains oriented to self only. Does recognize spouse. Speech still garbled most of the time. 1034 Elevated blood pressure treated with Hydralazine.

## 2023-01-26 NOTE — Progress Notes (Signed)
[facility] Progress Note  Patient: Roger Cook    ZOX:[id number]  DOA: 01/24/2023     Date of Service: the patient was seen and examined on 01/26/2023  Chief Complaint  Patient presents with   Bradycardia   Altered Mental Status   Brief hospital course: DAGOBERTO MCCORKEL is an 89 y.o. male with medical history of PE recently had syncopal episode secondary to pulmonary embolism, CAD, HTN, HLD, hypothyroid, prostate cancer, dementia, as reviewed from EMR, presented at [facility] ED with unresponsiveness.  Patient was found to have bradycardia.  On EMS eval was unresponsive, bradycardic and hypotensive.  HPI limited secondary to significant dementia. Cardiology was consulted and patient was admitted for further management as below.  Assessment and Plan:  # Syncope and Unresponsiveness, secondary to bradycardia He had a cardiac monitor for 3 weeks that was just returned by mail on Friday. All prior workups have not returned a cause for these episodes.  CT head negative for any acute findings Discontinued Toprol-XL and Aricept which can contribute to bradycardia as well Cardiology was consulted, recommended to hold beta-blocker and monitor.  No any other intervention  Continue fall precautions Continue to monitor on telemetry Orthostatic hypotension noticed today Will continue to monitor orthostatic vital signs every shift   Elevated creatinine most likely due to dehydration Bladder scan ruled out urinary retention US renal ruled out any obstruction Started IV fluid for hydration Monitor renal functions and urine output   History of pulmonary embolism, continued Eliquis   Hypertension Resumed Imdur home dose Started amlodipine Discontinued beta-blocker due to bradycardia Monitor BP and titrate medications accordingly  Dementia and depression Continue Aricept secondary to bradycardia, Continue Namenda, Paxil   Hypothyroid, continue Synthroid   Body mass index is 23.63  kg/m.  Interventions:      Diet: Heart healthy diet DVT Prophylaxis: Therapeutic Anticoagulation with Eliquis    Advance goals of care discussion: DNR  Family Communication: family was not present at bedside, at the time of interview.  The pt provided permission to discuss medical plan with the family. Opportunity was given to ask question and all questions were answered satisfactorily.   Disposition:  Pt is from Home, admitted with syncope, bradycardia, still has orthostatic hypotension and elevated creatinine, started IV fluid, which precludes a safe discharge. Discharge to home, when clinically stable, may need 1-2 more days to stay.  Subjective: No significant events overnight, patient has arthritic hypertension but denies any symptoms.  No headache or dizziness.  Denies any chest pain or palpitation, no shortness of breath.  No difficulty voiding.  Physical Exam: General: NAD, lying comfortably Appear in no distress, affect appropriate Eyes: PERRLA ENT: Oral Mucosa Clear, moist  Neck: no JVD,  Cardiovascular: S1 and S2 Present, no Murmur,  Respiratory: good respiratory effort, Bilateral Air entry equal and Decreased, no Crackles, no wheezes Abdomen: Bowel Sound present, Soft and no tenderness,  Skin: no rashes Extremities: no Pedal edema, no calf tenderness Neurologic: without any new focal findings Gait not checked due to patient safety concerns  Vitals:   01/26/23 1300 01/26/23 1400 01/26/23 1500 01/26/23 1600  BP: (!) 126/45 (!) 123/46 (!) 124/42 (!) 138/37  Pulse: 83 81 69 77  Resp: (!) 21 (!) 25 (!) 33 (!) 21  Temp:    98.6 F (37 C)  TempSrc:      SpO2: 99% 97% 98% 98%  Weight:      Height:        Intake/Output Summary (Last 24  hours) at 01/26/2023 1655 Last data filed at 01/26/2023 1001 Gross per 24 hour  Intake 610 ml  Output 225 ml  Net 385 ml   Filed Weights   01/24/23 1900 01/25/23 0500 01/25/23 0943  Weight: 71.6 kg 66.4 kg 66.4 kg    Data  Reviewed: I have personally reviewed and interpreted daily labs, tele strips, imagings as discussed above. I reviewed all nursing notes, pharmacy notes, vitals, pertinent old records I have discussed plan of care as described above with RN and patient/family.  CBC: Recent Labs  Lab 01/24/23 1953 01/25/23 0205 01/26/23 0610  WBC 9.0 6.1 5.6  NEUTROABS 6.8  --  3.2  HGB 10.3* 9.6* 10.4*  HCT 32.9* 30.2* 32.8*  MCV 95.6 93.5 91.9  PLT 220 180 191   Basic Metabolic Panel: Recent Labs  Lab 01/24/23 1953 01/25/23 0205 01/26/23 0607 01/26/23 0610  NA 140 138  --  139  K 4.4 4.1  --  3.8  CL 107 108  --  107  CO2 25 25  --  24  GLUCOSE 95 97  --  115*  BUN 28* 25*  --  28*  CREATININE 1.37* 1.21  --  1.48*  CALCIUM 8.7* 8.9  --  8.8*  MG  --   --  2.3  --   PHOS  --   --  4.2  --     Studies: US RENAL  Result Date: 01/26/2023 CLINICAL DATA:  Acute kidney injury. EXAM: RENAL / URINARY TRACT ULTRASOUND COMPLETE COMPARISON:  CT abdomen and pelvis 06/04/2020. Renal ultrasound 07/12/2018. FINDINGS: Right Kidney: Renal measurements: 9.4 x 4.0 x 5.1 cm = volume: 99 mL. Increased parenchymal echogenicity. No hydronephrosis. 1.7 cm upper pole cyst, similar in size to the prior CT. Left Kidney: Renal measurements: 9.4 x 4.6 x 4.1 cm = volume: 93 mL. Increased parenchymal echogenicity. No hydronephrosis. 1.2 cm and 1.0 cm upper pole cysts with the larger being similar in size to the prior studies. A previously shown lower pole cyst was not visualized today. Bladder: Appears normal for degree of bladder distention. Other: None. IMPRESSION: 1. Echogenic kidneys compatible with medical renal disease. No hydronephrosis. 2. Small bilateral renal cysts with no follow-up imaging recommended. Electronically Signed   By: Sebastian Ache M.D.   On: 01/26/2023 10:09    Scheduled Meds:  amLODipine  5 mg Oral QPM   apixaban  5 mg Oral BID   brimonidine  1 drop Left Eye BID   calcium carbonate  600 mg of  elemental calcium Oral BID WC   Chlorhexidine Gluconate Cloth  6 each Topical Daily   cyanocobalamin  1,000 mcg Oral Daily   ferrous sulfate  325 mg Oral QODAY   isosorbide mononitrate  60 mg Oral Daily   levothyroxine  88 mcg Oral Q0600   memantine  10 mg Oral BID   multivitamin with minerals  1 tablet Oral Q lunch   PARoxetine  10 mg Oral Daily   pravastatin  40 mg Oral QHS   sodium chloride flush  3 mL Intravenous Q12H   Continuous Infusions:  sodium chloride 75 mL/hr (01/26/23 1627)   PRN Meds: acetaminophen **OR** acetaminophen, fluticasone, hydrALAZINE, mouth rinse  Time spent: 35 minutes  Author: [name]. MD [facility] 01/26/2023 4:55 PM  To reach On-call, see care teams to locate the attending and reach out to them via [email or web address]. If 7PM-7AM, please contact night-coverage If you still have difficulty reaching the attending provider, please page  the [facility] (Director on Call) for [facility] on amion for assistance.

## 2023-01-26 NOTE — TOC Initial Note (Signed)
Transition of Care [facility]) - Initial/Assessment Note    Patient Details  Name: Roger Cook MRN: [id number] Date of Birth: [birth year]  Transition of Care [facility]) CM/SW Contact:    Roger Liner, LCSW Phone Number: 01/26/2023, 4:06 PM  Clinical Narrative:   Readmission prevention screen complete. CSW met with patient. Wife at bedside. CSW introduced role and explained that discharge planning would be discussed. PCP is Roger Merry, MD. Wife drives him to appointments. He uses [facility]. No issues obtaining medications. He lives home with his wife. He is active with [facility] for PT and nursing. They can add OT. Patient has a RW, SPC, and bedside commode at home. He also has two shower chairs that he doesn't use. No further concerns. CSW encouraged patient and his wife to contact CSW as needed. CSW will continue to follow patient and his wife for support and facilitate return home once stable. Daughter will likely transport him home at discharge.               Expected Discharge Plan: Home w Home Health Services Barriers to Discharge: Continued Medical Work up   Patient Goals and CMS Choice            Expected Discharge Plan and Services     Post Acute Care Choice: Resumption of Svcs/PTA Provider Living arrangements for the past 2 months: Single Family Home                           HH Arranged: RN, PT, OT [facility] Agency: [facility] Date HH Agency Contacted: 01/26/23   Representative spoke with at [facility] Agency: Roger Cook  Prior Living Arrangements/Services Living arrangements for the past 2 months: Single Family Home Lives with:: Spouse Patient language and need for interpreter reviewed:: Yes Do you feel safe going back to the place where you live?: Yes      Need for Family Participation in Patient Care: Yes (Comment) Care giver support system in place?: Yes (comment) Current home services: DME, Home PT, Home RN Criminal  Activity/Legal Involvement Pertinent to Current Situation/Hospitalization: No - Comment as needed  Activities of Daily Living Home Assistive Devices/Equipment: Walker (specify type), CPAP, Shower chair with back, Bedside commode/3-in-1, Blood pressure cuff, Raised toilet seat with rails, Eyeglasses ADL Screening (condition at time of admission) Patient's cognitive ability adequate to safely complete daily activities?: Yes Is the patient deaf or have difficulty hearing?: Yes Does the patient have difficulty seeing, even when wearing glasses/contacts?: No Does the patient have difficulty concentrating, remembering, or making decisions?: Yes Patient able to express need for assistance with ADLs?: Yes Does the patient have difficulty dressing or bathing?: Yes Independently performs ADLs?: No Communication: Independent Is this a change from baseline?: Pre-admission baseline Dressing (OT): Dependent Is this a change from baseline?: Pre-admission baseline Grooming: Dependent Is this a change from baseline?: Pre-admission baseline Feeding: Independent Is this a change from baseline?: Pre-admission baseline Bathing: Dependent Is this a change from baseline?: Pre-admission baseline Toileting: Dependent Is this a change from baseline?: Pre-admission baseline In/Out Bed: Dependent Is this a change from baseline?: Pre-admission baseline Walks in Home: Dependent Is this a change from baseline?: Pre-admission baseline Does the patient have difficulty walking or climbing stairs?: Yes Weakness of Legs: Both Weakness of Arms/Hands: Both  Permission Sought/Granted Permission sought to share information with : Facility Medical sales representative, Family Supports    Share Information with NAME: Roger Cook  Permission granted to share info w AGENCY: [facility]  Permission granted to share info w Relationship: Wife  Permission granted to share info w Contact Information:  [phone]  Emotional Assessment Appearance:: Appears stated age Attitude/Demeanor/Rapport: Engaged, Gracious Affect (typically observed): Accepting, Appropriate, Calm, Pleasant Orientation: : Oriented to Self, Oriented to Place Alcohol / Substance Use: Not Applicable Psych Involvement: No (comment)  Admission diagnosis:  Bradycardia [R00.1] Syncope, unspecified syncope type [R55] AMS (altered mental status) [R41.82] Patient Active Problem List   Diagnosis Date Noted   AMS (altered mental status) 01/24/2023   Sinus bradycardia 01/24/2023   Bilateral pulmonary embolism (HCC) 12/06/2022   Chronic kidney disease, stage 3a (HCC) 12/06/2022   Postural dizziness with presyncope 12/06/2022   Vitamin B12 deficiency 11/17/2022   Generalized weakness 11/17/2022   Positive blood culture 11/15/2022   Chronic anemia 11/13/2022   Hyperglycemia 07/09/2022   Iron deficiency anemia due to chronic blood loss    Angiodysplasia of intestinal tract    Severe anemia 12/24/2021   Angina pectoris (HCC) 09/18/2020   Dementia (HCC) 09/18/2020   Renal hematoma, right, subsequent encounter 04/25/2018   Localized osteoporosis of spine 12/23/2017   Constipation 11/06/2016   Bell's palsy 02/26/2015   Nephrogenic adenoma of bladder 02/26/2015   Folliculitis 02/26/2015   Personal history of methicillin resistant Staphylococcus aureus 02/26/2015   Fatty tumor 02/26/2015   Syncope 02/26/2015   Fungal infection of nail 02/26/2015   H/O malignant neoplasm of prostate 02/26/2015   Arteriosclerosis of coronary artery 01/05/2009   Severe obstructive sleep apnea 01/05/2009   Hypothyroidism 01/05/2009   Essential hypertension 09/22/1998   PCP:  Roger Limes, MD Pharmacy:   [facility] [place] [place] - [address] [place] [address] Phone: [phone] Fax: [phone]     Social Determinants of Health (SDOH) Social History: SDOH Screenings   Food Insecurity:  No Food Insecurity (01/25/2023)  Housing: Low Risk  (01/25/2023)  Transportation Needs: No Transportation Needs (01/25/2023)  Utilities: Not At Risk (01/25/2023)  Alcohol Screen: Low Risk  (12/23/2021)  Depression (PHQ2-9): Low Risk  (01/05/2023)  Financial Resource Strain: Low Risk  (02/07/2020)  Physical Activity: Sufficiently Active (02/07/2020)  Social Connections: Socially Integrated (02/07/2020)  Stress: No Stress Concern Present (02/07/2020)  Tobacco Use: Medium Risk (01/25/2023)   SDOH Interventions:     Readmission Risk Interventions    01/26/2023    3:59 PM  Readmission Risk Prevention Plan  Transportation Screening Complete  Medication Review (RN Care Manager) Complete  PCP or Specialist appointment within 3-5 days of discharge Complete  HRI or Home Care Consult Complete  SW Recovery Care/Counseling Consult Complete  Palliative Care Screening Not Applicable  Skilled Nursing Facility Not Applicable

## 2023-01-26 NOTE — Progress Notes (Signed)
[facility] Cardiology    SUBJECTIVE: Sleeping quitely. Denies pain. Still with mild confusion   Vitals:   01/26/23 1800 01/26/23 1900 01/26/23 2000 01/26/23 2100  BP: (!) 144/45 131/82 139/71 (!) 142/54  Pulse: 76 75 71 75  Resp: (!) 24 (!) 42 (!) 27 14  Temp:   98.3 F (36.8 C)   TempSrc:      SpO2: 100% 99% 97% 98%  Weight:      Height:         Intake/Output Summary (Last 24 hours) at 01/26/2023 2329 Last data filed at 01/26/2023 2000 Gross per 24 hour  Intake 1115.46 ml  Output 150 ml  Net 965.46 ml      PHYSICAL EXAM  General: Well developed, well nourished, in no acute distress HEENT:  Normocephalic and atramatic Neck:  No JVD.  Lungs: Clear bilaterally to auscultation and percussion. Heart: HRRR . Normal S1 and S2 without gallops or murmurs.  Abdomen: Bowel sounds are positive, abdomen soft and non-tender  Msk:  Back normal, normal gait. Normal strength and tone for age. Extremities: No clubbing, cyanosis or edema.   Neuro: Alert and oriented X 3. Psych:  Good affect, responds appropriately   LABS: Basic Metabolic Panel: Recent Labs    01/25/23 0205 01/26/23 0607 01/26/23 0610  NA 138  --  139  K 4.1  --  3.8  CL 108  --  107  CO2 25  --  24  GLUCOSE 97  --  115*  BUN 25*  --  28*  CREATININE 1.21  --  1.48*  CALCIUM 8.9  --  8.8*  MG  --  2.3  --   PHOS  --  4.2  --    Liver Function Tests: Recent Labs    01/24/23 1953 01/25/23 0205  AST 28 24  ALT 20 20  ALKPHOS 49 46  BILITOT 0.6 0.6  PROT 6.8 6.1*  ALBUMIN 3.9 3.5   No results for input(s): "LIPASE", "AMYLASE" in the last 72 hours. CBC: Recent Labs    01/24/23 1953 01/25/23 0205 01/26/23 0610  WBC 9.0 6.1 5.6  NEUTROABS 6.8  --  3.2  HGB 10.3* 9.6* 10.4*  HCT 32.9* 30.2* 32.8*  MCV 95.6 93.5 91.9  PLT 220 180 191   Cardiac Enzymes: No results for input(s): "CKTOTAL", "CKMB", "CKMBINDEX", "TROPONINI" in the last 72 hours. BNP: Invalid input(s): "POCBNP" D-Dimer: No results for  input(s): "DDIMER" in the last 72 hours. Hemoglobin A1C: No results for input(s): "HGBA1C" in the last 72 hours. Fasting Lipid Panel: No results for input(s): "CHOL", "HDL", "LDLCALC", "TRIG", "CHOLHDL", "LDLDIRECT" in the last 72 hours. Thyroid Function Tests: Recent Labs    01/25/23 0205  TSH 2.417   Anemia Panel: No results for input(s): "VITAMINB12", "FOLATE", "FERRITIN", "TIBC", "IRON", "RETICCTPCT" in the last 72 hours.  US RENAL  Result Date: 01/26/2023 CLINICAL DATA:  Acute kidney injury. EXAM: RENAL / URINARY TRACT ULTRASOUND COMPLETE COMPARISON:  CT abdomen and pelvis 06/04/2020. Renal ultrasound 07/12/2018. FINDINGS: Right Kidney: Renal measurements: 9.4 x 4.0 x 5.1 cm = volume: 99 mL. Increased parenchymal echogenicity. No hydronephrosis. 1.7 cm upper pole cyst, similar in size to the prior CT. Left Kidney: Renal measurements: 9.4 x 4.6 x 4.1 cm = volume: 93 mL. Increased parenchymal echogenicity. No hydronephrosis. 1.2 cm and 1.0 cm upper pole cysts with the larger being similar in size to the prior studies. A previously shown lower pole cyst was not visualized today. Bladder: Appears normal for  degree of bladder distention. Other: None. IMPRESSION: 1. Echogenic kidneys compatible with medical renal disease. No hydronephrosis. 2. Small bilateral renal cysts with no follow-up imaging recommended. Electronically Signed   By: Sebastian Ache M.D.   On: 01/26/2023 10:09     Echo EF 60-65%  TELEMETRY: Sinus brady nsstw:  ASSESSMENT AND PLAN:  Principal Problem:   AMS (altered mental status) Active Problems:   Essential hypertension   Syncope   Severe obstructive sleep apnea   Hypothyroidism   Dementia (HCC)   Chronic anemia   Bilateral pulmonary embolism (HCC)   Chronic kidney disease, stage 3a (HCC)   Sinus bradycardia    Plan Continue current therapy Contine hydration CPAP for OSA  Anticoug for DVT/PE CRI III rec hydration ,consider nephrology Conservative  management    Alwyn Pea, MD, 01/26/2023 11:29 PM

## 2023-01-26 NOTE — Progress Notes (Signed)
Good day. Up to the chair for 3 hours. Back to bed without issues. Used his 4 wheel walker. No complaints of pain. Patient pleasant all day.

## 2023-01-26 NOTE — Evaluation (Signed)
Occupational Therapy Evaluation Patient Details Name: Roger Cook MRN: [id number] DOB: [birth year] Today's Date: 01/26/2023   History of Present Illness 89 year old white male with a past medical history of coronary artery disease dementia presented to the hospital after passing out.  Patient apparently was bradycardic in the 40s and was hypotensive with blood pressure systolic around 80.  At this time patient appears to be little bit more alert and communicating.  Patient states that he was at home and found himself on the floor.   Clinical Impression   Patient presenting with decreased Ind in self care,balance, functional mobility/transfers, endurance, and safety awareness. Patient's daughter present in the room reports pt needing some assistance with self care and mobility secondary to cognition at baseline. His wife assists as well as PCA several times a week for 4 hours. Pt needing min A of 2 for functional mobility tasks. He is positive for orthostatic hypotension but remains asymptomatic. RN and MD notified. Patient will benefit from acute OT to increase overall independence in the areas of ADLs, functional mobility, and safety awareness in order to safely discharge.  ORTHOSTATIC BP:  Supine: 123/31 Sitting EOB: 110/41 Standing: 81/39 Returned to seated position: 105/33     Recommendations for follow up therapy are one component of a multi-disciplinary discharge planning process, led by the attending physician.  Recommendations may be updated based on patient status, additional functional criteria and insurance authorization.   Assistance Recommended at Discharge Intermittent Supervision/Assistance  Patient can return home with the following A little help with walking and/or transfers;A little help with bathing/dressing/bathroom;Assistance with cooking/housework;Assist for transportation;Help with stairs or ramp for entrance;Direct supervision/assist for financial management;Direct  supervision/assist for medications management    Functional Status Assessment  Patient has had a recent decline in their functional status and demonstrates the ability to make significant improvements in function in a reasonable and predictable amount of time.  Equipment Recommendations  None recommended by OT    Recommendations for Other Services       Precautions / Restrictions Precautions Precautions: Fall      Mobility Bed Mobility Overal bed mobility: Needs Assistance Bed Mobility: Supine to Sit, Sit to Supine     Supine to sit: Min guard Sit to supine: Min guard        Transfers Overall transfer level: Needs assistance Equipment used: Rolling walker (2 wheels) Transfers: Sit to/from Stand, Bed to chair/wheelchair/BSC Sit to Stand: Min assist, +2 safety/equipment     Step pivot transfers: Min assist, +2 safety/equipment            Balance Overall balance assessment: Needs assistance Sitting-balance support: Feet supported Sitting balance-Leahy Scale: Good     Standing balance support: Reliant on assistive device for balance, Bilateral upper extremity supported Standing balance-Leahy Scale: Fair                             ADL either performed or assessed with clinical judgement   ADL Overall ADL's : Needs assistance/impaired                         Toilet Transfer: Minimal assistance;Rolling walker (2 wheels) Toilet Transfer Details (indicate cue type and reason): simulated         Functional mobility during ADLs: Minimal assistance;Rolling walker (2 wheels)       Vision Patient Visual Report: No change from baseline       Perception  Praxis      Pertinent Vitals/Pain Pain Assessment Pain Assessment: No/denies pain     Hand Dominance     Extremity/Trunk Assessment Upper Extremity Assessment Upper Extremity Assessment: Overall WFL for tasks assessed   Lower Extremity Assessment Lower Extremity  Assessment: Defer to PT evaluation       Communication Communication Communication: No difficulties   Cognition Arousal/Alertness: Awake/alert Behavior During Therapy: WFL for tasks assessed/performed Overall Cognitive Status: History of cognitive impairments - at baseline                                 General Comments: Pt is pleasant and cooperative and follows 1 step commands.     General Comments       Exercises     Shoulder Instructions      Home Living Family/patient expects to be discharged to:: Private residence Living Arrangements: Spouse/significant other Available Help at Discharge: Family;Available 24 hours/day;Personal care attendant Type of Home: House Home Access: Stairs to enter [employer] of Steps: 4 Entrance Stairs-Rails: Right;Left Home Layout: Two level Alternate Level Stairs-Number of Steps: flight   Bathroom Shower/Tub: Chief Strategy Officer: Standard     Home Equipment: Agricultural consultant (2 wheels);BSC/3in1;Shower seat;Hospital bed   Additional Comments: Daughter reports, pt lives on main floor whre hospital bed and [facility] is placed. 1 daughter and son live in [place]. Other daughter lives out of state.      Prior Functioning/Environment Prior Level of Function : Needs assist             Mobility Comments: Pt's daughter present and reported: Pt ambualtes at household level with one person using RW, does not drive ADLs Comments: HH aide 2x/wk for ~4 hours, other hired caregiver comes 1-2x/wk to assist with bathing (due to pt only wanting male to assist with bathing). Receives assistance with all ADLs/IADLs. BSC for toileting. Urinary incontinence at baseline (wears depends). Caregivers also assist with IADLs as needed.        OT Problem List: Decreased strength;Decreased activity tolerance;Decreased safety awareness;Impaired balance (sitting and/or standing);Decreased knowledge of use of DME or AE       OT Treatment/Interventions: Self-care/ADL training;Therapeutic exercise;Therapeutic activities;DME and/or AE instruction;Patient/family education;Balance training    OT Goals(Current goals can be found in the care plan section) Acute Rehab OT Goals Patient Stated Goal: to go home OT Goal Formulation: With patient/family Time For Goal Achievement: 02/09/23 Potential to Achieve Goals: Good ADL Goals Pt Will Perform Grooming: with supervision;standing Pt Will Perform Lower Body Dressing: with supervision;sit to/from stand Pt Will Transfer to Toilet: with supervision;ambulating Pt Will Perform Toileting - Clothing Manipulation and hygiene: with supervision;sit to/from stand  OT Frequency: Min 2X/week    Co-evaluation PT/OT/SLP Co-Evaluation/Treatment: Yes Reason for Co-Treatment: To address functional/ADL transfers;Complexity of the patient's impairments (multi-system involvement) PT goals addressed during session: Mobility/safety with mobility OT goals addressed during session: ADL's and self-care      AM-PAC OT "6 Clicks" Daily Activity     Outcome Measure Help from another person eating meals?: None Help from another person taking care of personal grooming?: A Little Help from another person toileting, which includes using toliet, bedpan, or urinal?: A Little Help from another person bathing (including washing, rinsing, drying)?: A Little Help from another person to put on and taking off regular upper body clothing?: None Help from another person to put on and taking off regular lower body clothing?:  A Little 6 Click Score: 20   End of Session Equipment Utilized During Treatment: Rolling walker (2 wheels) Nurse Communication: Mobility status;Other (comment) (orthostatic hypotension)  Activity Tolerance: Treatment limited secondary to medical complications (Comment) Patient left: with call bell/phone within reach;in chair;with family/visitor present  OT Visit Diagnosis: Muscle  weakness (generalized) (M62.81);Unsteadiness on feet (R26.81);History of falling (Z91.81)                Time: 7829-5621 OT Time Calculation (min): 18 min Charges:  OT General Charges $OT Visit: 1 Visit OT Evaluation $OT Eval Moderate Complexity: [address], [place], OTR/L , CBIS ascom [phone]  01/26/23, 1:55 PM

## 2023-01-27 DIAGNOSIS — R4182 Altered mental status, unspecified: Secondary | ICD-10-CM | POA: Diagnosis not present

## 2023-01-27 DIAGNOSIS — R55 Syncope and collapse: Secondary | ICD-10-CM | POA: Diagnosis not present

## 2023-01-27 LAB — CBC
HCT: 33 % — ABNORMAL LOW (ref 39.0–52.0)
Hemoglobin: 10.5 g/dL — ABNORMAL LOW (ref 13.0–17.0)
MCH: 29.4 pg (ref 26.0–34.0)
MCHC: 31.8 g/dL (ref 30.0–36.0)
MCV: 92.4 fL (ref 80.0–100.0)
Platelets: 208 10*3/uL (ref 150–400)
RBC: 3.57 MIL/uL — ABNORMAL LOW (ref 4.22–5.81)
RDW: 19.1 % — ABNORMAL HIGH (ref 11.5–15.5)
WBC: 5.9 10*3/uL (ref 4.0–10.5)
nRBC: 0 % (ref 0.0–0.2)

## 2023-01-27 LAB — BASIC METABOLIC PANEL
Anion gap: 7 (ref 5–15)
BUN: 30 mg/dL — ABNORMAL HIGH (ref 8–23)
CO2: 23 mmol/L (ref 22–32)
Calcium: 8.6 mg/dL — ABNORMAL LOW (ref 8.9–10.3)
Chloride: 107 mmol/L (ref 98–111)
Creatinine, Ser: 1.35 mg/dL — ABNORMAL HIGH (ref 0.61–1.24)
GFR, Estimated: 49 mL/min — ABNORMAL LOW (ref >60)
Glucose, Bld: 113 mg/dL — ABNORMAL HIGH (ref 70–99)
Potassium: 3.6 mmol/L (ref 3.5–5.1)
Sodium: 137 mmol/L (ref 135–145)

## 2023-01-27 LAB — PHOSPHORUS: Phosphorus: 4.3 mg/dL (ref 2.5–4.6)

## 2023-01-27 LAB — MAGNESIUM: Magnesium: 2.1 mg/dL (ref 1.7–2.4)

## 2023-01-27 MED ORDER — AMLODIPINE BESYLATE 5 MG PO TABS: 5.0000 mg | ORAL_TABLET | Freq: Every evening | ORAL | 11 refills | Status: DC

## 2023-01-27 NOTE — TOC Transition Note (Signed)
Transition of Care [facility]) - CM/SW Discharge Note   Patient Details  Name: Roger Cook MRN: [id number] Date of Birth: 1927/08/14  [facility]) CM/SW Contact:  Margarito Liner, LCSW Phone Number: 01/27/2023, 1:10 PM   Clinical Narrative:   Patient has orders to discharge home today. [facility] liaison is aware. No further concerns. CSW signing off.  Final next level of care: Home w Home Health Services Barriers to Discharge: Barriers Resolved   Patient Goals and CMS Choice      Discharge Placement                      Patient and family notified of of transfer: 01/27/23  Discharge Plan and Services Additional resources added to the After Visit Summary for       Post Acute Care Choice: Resumption of Svcs/PTA Provider                    HH Arranged: RN, PT, OT, Nurse's Aide [facility] Agency: [facility] Date [facility] Agency Contacted: 01/27/23   Representative spoke with at [facility] Agency: Feliberto Gottron  Social Determinants of Health (SDOH) Interventions SDOH Screenings   Food Insecurity: No Food Insecurity (01/25/2023)  Housing: Low Risk  (01/25/2023)  Transportation Needs: No Transportation Needs (01/25/2023)  Utilities: Not At Risk (01/25/2023)  Alcohol Screen: Low Risk  (12/23/2021)  Depression (PHQ2-9): Low Risk  (01/05/2023)  Financial Resource Strain: Low Risk  (02/07/2020)  Physical Activity: Sufficiently Active (02/07/2020)  Social Connections: Socially Integrated (02/07/2020)  Stress: No Stress Concern Present (02/07/2020)  Tobacco Use: Medium Risk (01/25/2023)     Readmission Risk Interventions    01/26/2023    3:59 PM  Readmission Risk Prevention Plan  Transportation Screening Complete  Medication Review (RN Care Manager) Complete  PCP or Specialist appointment within 3-5 days of discharge Complete  HRI or Home Care Consult Complete  SW Recovery Care/Counseling Consult Complete  Palliative Care Screening Not Applicable  Skilled  Nursing Facility Not Applicable

## 2023-01-27 NOTE — Discharge Summary (Signed)
[facility] Discharge Summary   Patient: Roger Cook:[id number]  PCP: [name], MD  Date of admission: 01/24/2023   Date of discharge:  01/27/2023     Discharge Diagnoses:  Principal Problem:   AMS (altered mental status) Active Problems:   Chronic anemia   Bilateral pulmonary embolism (HCC)   Sinus bradycardia   Essential hypertension   Syncope   Severe obstructive sleep apnea   Hypothyroidism   Dementia (HCC)   Chronic kidney disease, stage 3a (HCC)   Admitted From: Home Disposition:  Home with [facility] services  Recommendations for Outpatient Follow-up:  Follow with PCP in 1 week, continue to monitor BP at home and follow with PCP to titrate medications accordingly.  Discontinued Lopressor due to bradycardia and also discontinued Aricept due to bradycardia.  Started amlodipine 5 mg every evening, skip the dose if systolic BP less than 140 mmHg due to orthostatic hypotension.  Repeat BMP after 1 week to check renal functions.  Creatinine is slightly high, continue oral hydration at home. Follow with cardiology in 1 to 2 weeks Follow up LABS/TEST:  BMP in 1 wk   Follow-up Information     Callwood, Gerda Diss D, MD. Go in 1 week(s).   Specialties: Cardiology, Internal Medicine Contact information: [address] [place] [address] [phone]                Diet recommendation: Cardiac diet  Activity: The patient is advised to gradually reintroduce usual activities, as tolerated  Discharge Condition: stable  Code Status: DNR   History of present illness: As per the H and P dictated on admission Hospital Course:   Roger Cook is an 89 y.o. male with medical history of PE recently had syncopal episode secondary to pulmonary embolism, CAD, HTN, HLD, hypothyroid, prostate cancer, dementia, as reviewed from EMR, presented at [facility] ED with unresponsiveness.  Patient was found to have bradycardia.  On EMS eval was unresponsive, bradycardic and  hypotensive.  HPI limited secondary to significant dementia. Cardiology was consulted and patient was admitted for further management as below.   Assessment and Plan: # Syncope and Unresponsiveness, secondary to bradycardia He had a cardiac monitor for 3 weeks that was just returned by mail on Friday. All prior workups have not returned a cause for these episodes.  CT head negative for any acute findings. Discontinued Toprol-XL and Aricept which can contribute to bradycardia as well. Cardiology was consulted, recommended to hold beta-blocker and monitor.  No any other intervention. Continue fall precautions. Orthostatic hypotension noticed on 5/6 but resolved today.  # Hypertension, Resumed Imdur home dose. Started amlodipine 5 mg p.o. daily with holding parameters to skip the dose if SBP less than 140 mmHg. Discontinued beta-blocker due to bradycardia. Monitor BP at home and follow with PCP to titrate medications accordingly # Elevated creatinine most likely due to dehydration Bladder scan ruled out urinary retention, US renal ruled out any obstruction Creatinine 1.48--1.35 slightly improved after IVF.  Patient was advised to continue oral hydration.  Follow with PCP to repeat BMP after 1 week.  # History of pulmonary embolism, continued Eliquis # Dementia and depression, discontinued Aricept secondary to bradycardia. Continue Namenda, Paxil Hypothyroid, continue Synthroid Body mass index is 23.48 kg/m.  Nutrition Interventions:   Patient was seen by physical therapy, who recommended Home health, which was arranged. On the day of the discharge the patient's vitals were stable, and no other acute medical condition were reported by patient. the patient  was felt safe to be discharge at Home with Home health.  Consultants: Cardiologist Procedures: none  Discharge Exam: General: Appear in no distress, no Rash; Oral Mucosa Clear, moist. Cardiovascular: S1 and S2 Present, no Murmur, Respiratory:  normal respiratory effort, Bilateral Air entry present and no Crackles, no wheezes Abdomen: Bowel Sound present, Soft and no tenderness, no hernia Extremities: no Pedal edema, no calf tenderness Neurology: alert and oriented to time, place, and person affect appropriate.  Filed Weights   01/25/23 0500 01/25/23 0943 01/27/23 0410  Weight: 66.4 kg 66.4 kg 66 kg   Vitals:   01/27/23 0800 01/27/23 1121  BP: (!) 185/60 (!) 164/56  Pulse: 80 65  Resp: 19 (!) 27  Temp: 98 F (36.7 C)   SpO2: 100% 97%    DISCHARGE MEDICATION: Allergies as of 01/27/2023       Reactions   Levofloxacin Swelling   lips swelling   Povidone Iodine Other (See Comments)   Unknown, can not remember   Sulfa Antibiotics    Unknown, can not remember   Benadryl [diphenhydramine] Rash        Medication List     STOP taking these medications    donepezil 10 MG tablet Commonly known as: ARICEPT   metoprolol succinate 25 MG 24 hr tablet Commonly known as: TOPROL-XL       TAKE these medications    acetaminophen 325 MG tablet Commonly known as: TYLENOL Take 2 tablets (650 mg total) by mouth every 6 (six) hours as needed for mild pain (or Fever >/= 101).   amLODipine 5 MG tablet Commonly known as: NORVASC Take 1 tablet (5 mg total) by mouth every evening. Skip the dose if systolic BP less than 140 mmHg   apixaban 5 MG Tabs tablet Commonly known as: ELIQUIS Take 1 tablet (5 mg total) by mouth 2 (two) times daily.   ARTIFICIAL TEAR SOLUTION OP Place 1 drop into both eyes 2 (two) times daily as needed (dry eyes).   brimonidine 0.2 % ophthalmic solution Commonly known as: ALPHAGAN Place 1 drop into the left eye 2 (two) times daily.   CALCIUM 1200 PO Take 1,200 mg by mouth daily.   cyanocobalamin 1000 MCG tablet Commonly known as: VITAMIN B12 Take 1 tablet (1,000 mcg total) by mouth daily.   ferrous sulfate 325 (65 FE) MG tablet Take 1 tablet (325 mg total) by mouth every other day.    fluticasone 50 MCG/ACT nasal spray Commonly known as: FLONASE Place 2 sprays into both nostrils daily as needed for allergies.   isosorbide mononitrate 60 MG 24 hr tablet Commonly known as: IMDUR TAKE ONE TABLET EVERY DAY   ketoconazole 2 % cream Commonly known as: NIZORAL APPLY TO GLANS OF PENIS DAILY   levothyroxine 88 MCG tablet Commonly known as: SYNTHROID TAKE 1 TABLET EVERY DAY ON EMPTY STOMACHWITH A GLASS OF WATER AT LEAST 30-60 MINBEFORE BREAKFAST   memantine 10 MG tablet Commonly known as: NAMENDA TAKE 1 TABLET BY MOUTH TWO TIMES DAILY   MULTIPLE VITAMIN PO Take 1 tablet by mouth daily.   NON FORMULARY CPAP (Free Text) - Historical Medication  As directed  Started 22-Sep-1994 Active   PARoxetine 10 MG tablet Commonly known as: PAXIL Take 10 mg by mouth daily.   pravastatin 40 MG tablet Commonly known as: PRAVACHOL TAKE 1 TABLET BY MOUTH DAILY   Prevagen 10 MG Caps Generic drug: Apoaequorin Take 10 mg by mouth daily.   PreviDent 5000 Dry Mouth 1.1 % Gel  dental gel Generic drug: sodium fluoride Place 1 application  onto teeth 2 (two) times daily.       Allergies  Allergen Reactions   Levofloxacin Swelling    lips swelling   Povidone Iodine Other (See Comments)    Unknown, can not remember   Sulfa Antibiotics     Unknown, can not remember   Benadryl [Diphenhydramine] Rash   Discharge Instructions     Call MD for:  difficulty breathing, headache or visual disturbances   Complete by: As directed    Call MD for:  extreme fatigue   Complete by: As directed    Call MD for:  persistant dizziness or light-headedness   Complete by: As directed    Call MD for:  temperature >100.4   Complete by: As directed    Diet - low sodium heart healthy   Complete by: As directed    Discharge instructions   Complete by: As directed    Follow with PCP in 1 week, continue to monitor BP at home and follow with PCP to titrate medications accordingly.  Discontinued  Lopressor due to bradycardia and also discontinued Aricept due to bradycardia.  Started amlodipine 5 mg every evening, skip the dose if systolic BP less than 140 mmHg due to orthostatic hypotension.  Repeat BMP after 1 week to check renal functions.  Creatinine is slightly high, continue oral hydration at home. Follow with cardiology in 1 to 2 weeks   Increase activity slowly   Complete by: As directed        The results of significant diagnostics from this hospitalization (including imaging, microbiology, ancillary and laboratory) are listed below for reference.    Significant Diagnostic Studies: US RENAL  Result Date: 01/26/2023 CLINICAL DATA:  Acute kidney injury. EXAM: RENAL / URINARY TRACT ULTRASOUND COMPLETE COMPARISON:  CT abdomen and pelvis 06/04/2020. Renal ultrasound 07/12/2018. FINDINGS: Right Kidney: Renal measurements: 9.4 x 4.0 x 5.1 cm = volume: 99 mL. Increased parenchymal echogenicity. No hydronephrosis. 1.7 cm upper pole cyst, similar in size to the prior CT. Left Kidney: Renal measurements: 9.4 x 4.6 x 4.1 cm = volume: 93 mL. Increased parenchymal echogenicity. No hydronephrosis. 1.2 cm and 1.0 cm upper pole cysts with the larger being similar in size to the prior studies. A previously shown lower pole cyst was not visualized today. Bladder: Appears normal for degree of bladder distention. Other: None. IMPRESSION: 1. Echogenic kidneys compatible with medical renal disease. No hydronephrosis. 2. Small bilateral renal cysts with no follow-up imaging recommended. Electronically Signed   By: Sebastian Ache M.D.   On: 01/26/2023 10:09   CT Head Wo Contrast  Result Date: 01/24/2023 CLINICAL DATA:  Bradycardia and altered mental status. EXAM: CT HEAD WITHOUT CONTRAST TECHNIQUE: Contiguous axial images were obtained from the base of the skull through the vertex without intravenous contrast. RADIATION DOSE REDUCTION: This exam was performed according to the departmental dose-optimization  program which includes automated exposure control, adjustment of the mA and/or kV according to patient size and/or use of iterative reconstruction technique. COMPARISON:  Head CT without contrast 12/19/2022 FINDINGS: Brain: There is moderately advanced cerebral atrophy, small-vessel disease and atrophic ventriculomegaly commensurate with age. Relatively mild cerebellar atrophy. There are tiny chronic bilateral gangliocapsular lacunar infarcts and another small chronic lacunar infarct in the left thalamus. There is no midline shift. No new asymmetry is seen worrisome for acute infarct, hemorrhage or mass. Basal cisterns are clear. Vascular: There are calcifications in the carotid siphons and distal vertebral arteries.  No hyperdense central vessel is seen. Skull: Negative for fractures or focal lesions. Sinuses/Orbits: Old sinonasal surgical changes. Complete opacification of the frontal sinuses is again noted, with interval resolution of the prior finding of moderate circumferential membrane thickening left maxillary sinus. Other sinuses are clear. Trace fluid again noted left mastoid tip with no other significant bilateral mastoid disease. Other: None. IMPRESSION: 1. No acute intracranial CT findings or intracranial interval changes. 2. Chronic changes. 3. Sinus disease, again with complete opacification of the frontal sinuses. Electronically Signed   By: Almira Bar M.D.   On: 01/24/2023 20:25   DG Chest Port 1 View  Result Date: 01/24/2023 CLINICAL DATA:  Questionable sepsis EXAM: PORTABLE CHEST 1 VIEW COMPARISON:  12/19/2022 FINDINGS: Linear bibasilar atelectasis. No confluent opacities or effusions. Heart mediastinal contours within normal limits. Aortic atherosclerosis. No acute bony abnormality. IMPRESSION: Bibasilar atelectasis or scarring. No active disease. Electronically Signed   By: Charlett Nose M.D.   On: 01/24/2023 19:39    Microbiology: Recent Results (from the past 240 hour(s))  Blood  Culture (routine x 2)     Status: None (Preliminary result)   Collection Time: 01/24/23  7:53 PM   Specimen: BLOOD  Result Value Ref Range Status   Specimen Description BLOOD BLOOD RIGHT ARM  Final   Special Requests   Final    BOTTLES DRAWN AEROBIC AND ANAEROBIC Blood Culture results may not be optimal due to an inadequate volume of blood received in culture bottles   Culture   Final    NO GROWTH 3 DAYS Performed at [facility], [address] [place], [place] [address]    Report Status PENDING  Incomplete  Blood Culture (routine x 2)     Status: None (Preliminary result)   Collection Time: 01/24/23  7:53 PM   Specimen: BLOOD  Result Value Ref Range Status   Specimen Description BLOOD BLOOD LEFT ARM  Final   Special Requests   Final    BOTTLES DRAWN AEROBIC AND ANAEROBIC Blood Culture adequate volume   Culture   Final    NO GROWTH 3 DAYS Performed at [facility], [address] [place], [place] [address]    Report Status PENDING  Incomplete  MRSA Next Gen by PCR, Nasal     Status: None   Collection Time: 01/25/23  4:54 AM   Specimen: Nasal Mucosa; Nasal Swab  Result Value Ref Range Status   MRSA by PCR Next Gen NOT DETECTED NOT DETECTED Final    Comment: (NOTE) The GeneXpert MRSA Assay (FDA approved for NASAL specimens only), is one component of a comprehensive MRSA colonization surveillance program. It is not intended to diagnose MRSA infection nor to guide or monitor treatment for MRSA infections. Test performance is not FDA approved in patients less than 26 years old. Performed at [facility], [address] [place], [place] [address]      Labs: CBC: Recent Labs  Lab 01/24/23 1953 01/25/23 0205 01/26/23 0610 01/27/23 0456  WBC 9.0 6.1 5.6 5.9  NEUTROABS 6.8  --  3.2  --   HGB 10.3* 9.6* 10.4* 10.5*  HCT 32.9* 30.2* 32.8* 33.0*  MCV 95.6 93.5 91.9 92.4  PLT 220 180 191 208   Basic Metabolic Panel: Recent Labs  Lab  01/24/23 1953 01/25/23 0205 01/26/23 0607 01/26/23 0610 01/27/23 0456  NA 140 138  --  139 137  K 4.4 4.1  --  3.8 3.6  CL 107 108  --  107  107  CO2 25 25  --  24 23  GLUCOSE 95 97  --  115* 113*  BUN 28* 25*  --  28* 30*  CREATININE 1.37* 1.21  --  1.48* 1.35*  CALCIUM 8.7* 8.9  --  8.8* 8.6*  MG  --   --  2.3  --  2.1  PHOS  --   --  4.2  --  4.3   Liver Function Tests: Recent Labs  Lab 01/24/23 1953 01/25/23 0205  AST 28 24  ALT 20 20  ALKPHOS 49 46  BILITOT 0.6 0.6  PROT 6.8 6.1*  ALBUMIN 3.9 3.5   No results for input(s): "LIPASE", "AMYLASE" in the last 168 hours. No results for input(s): "AMMONIA" in the last 168 hours. Cardiac Enzymes: No results for input(s): "CKTOTAL", "CKMB", "CKMBINDEX", "TROPONINI" in the last 168 hours. BNP (last 3 results) No results for input(s): "BNP" in the last 8760 hours. CBG: Recent Labs  Lab 01/24/23 1857 01/25/23 0450  GLUCAP 96 85    Time spent: 35 minutes  Signed:  [name]  [facility] 01/27/2023 12:09 PM

## 2023-01-28 ENCOUNTER — Telehealth: Payer: Self-pay

## 2023-01-28 ENCOUNTER — Ambulatory Visit (INDEPENDENT_AMBULATORY_CARE_PROVIDER_SITE_OTHER): Payer: Medicare Other | Admitting: Family Medicine

## 2023-01-28 DIAGNOSIS — R531 Weakness: Secondary | ICD-10-CM

## 2023-01-28 DIAGNOSIS — I2510 Atherosclerotic heart disease of native coronary artery without angina pectoris: Secondary | ICD-10-CM

## 2023-01-28 DIAGNOSIS — R55 Syncope and collapse: Secondary | ICD-10-CM

## 2023-01-28 DIAGNOSIS — I2699 Other pulmonary embolism without acute cor pulmonale: Secondary | ICD-10-CM

## 2023-01-28 DIAGNOSIS — R4182 Altered mental status, unspecified: Secondary | ICD-10-CM

## 2023-01-28 DIAGNOSIS — I10 Essential (primary) hypertension: Secondary | ICD-10-CM | POA: Diagnosis not present

## 2023-01-28 DIAGNOSIS — D500 Iron deficiency anemia secondary to blood loss (chronic): Secondary | ICD-10-CM

## 2023-01-28 DIAGNOSIS — R42 Dizziness and giddiness: Secondary | ICD-10-CM | POA: Diagnosis not present

## 2023-01-28 NOTE — Transitions of Care (Post Inpatient/ED Visit) (Signed)
01/28/2023  Name: Roger Cook MRN: [id number] DOB: 06-Jun-1927  Today's TOC FU Call Status: Today's TOC FU Call Status:: Successful TOC FU Call Competed TOC FU Call Complete Date: 01/28/23  Transition Care Management Follow-up Telephone Call Date of Discharge: 01/27/23 Type of Discharge: Inpatient Admission Primary Inpatient Discharge Diagnosis:: Altered Mental Status How have you been since you were released from the hospital?: Better (Spoke with RH who was with [facility], Pts BP was high this morning, when she took it was 138/72, P68) Any questions or concerns?: No  Items Reviewed: Did you receive and understand the discharge instructions provided?: Yes Medications obtained,verified, and reconciled?: Yes (Medications Reviewed) Any new allergies since your discharge?: No Dietary orders reviewed?: No Do you have support at home?: Yes People in Home: spouse Name of Support/Comfort Primary Source: Roger Cook  Medications Reviewed Today: Medications Reviewed Today     Reviewed by Jodelle Gross, RN (Case Manager) on 01/28/23 at 1515  Med List Status: <None>   Medication Order Taking? Sig Documenting Provider Last Dose Status Informant  acetaminophen (TYLENOL) 325 MG tablet [id number] No Take 2 tablets (650 mg total) by mouth every 6 (six) hours as needed for mild pain (or Fever >/= 101). Roger Dys, MD unk unk Active Spouse/Significant Other, Multiple Informants, Pharmacy Records  amLODipine (NORVASC) 5 MG tablet [id number]  Take 1 tablet (5 mg total) by mouth every evening. Skip the dose if systolic BP less than 140 mmHg [name], MD  Active   apixaban (ELIQUIS) 5 MG TABS tablet [id number] No Take 1 tablet (5 mg total) by mouth 2 (two) times daily. Roger Dys, MD Past Week Active Spouse/Significant Other, Multiple Informants, Pharmacy Records  Apoaequorin [facility]) 10 MG CAPS [id number] No Take 10 mg by mouth daily. [provider] Past Week Active  Spouse/Significant Other, Multiple Informants, Pharmacy Records  ARTIFICIAL TEAR SOLUTION OP [id number] No Place 1 drop into both eyes 2 (two) times daily as needed (dry eyes). [provider] unk unk Active Spouse/Significant Other, Multiple Informants, Pharmacy Records           Med Note Marcelino Freestone   Thu Nov 19, 2017  4:01 PM)    brimonidine (ALPHAGAN) 0.2 % ophthalmic solution [id number] No Place 1 drop into the left eye 2 (two) times daily. [provider] Past Week Active Spouse/Significant Other, Multiple Informants, Pharmacy Records  Calcium Carbonate-Vit D-Min (CALCIUM 1200 PO) [id number] No Take 1,200 mg by mouth daily. [provider] Past Week Active Spouse/Significant Other, Multiple Informants, Pharmacy Records  cyanocobalamin (VITAMIN B12) 1000 MCG tablet [id number] No Take 1 tablet (1,000 mcg total) by mouth daily. Roger Banter, DO Past Week Active Spouse/Significant Other, Multiple Informants, Pharmacy Records  ferrous sulfate 325 (65 FE) MG tablet [id number] No Take 1 tablet (325 mg total) by mouth every other day. Roger Limes, MD Past Week Active Multiple Informants, Pharmacy Records, Spouse/Significant Other  fluticasone [facility]) 50 MCG/ACT nasal spray [id number] No Place 2 sprays into both nostrils daily as needed for allergies. Roger Limes, MD unk unk Active Spouse/Significant Other, Multiple Informants, Pharmacy Records  isosorbide mononitrate (IMDUR) 60 MG 24 hr tablet [id number] No TAKE ONE TABLET EVERY DAY Roger Limes, MD Past Week Active Spouse/Significant Other, Multiple Informants, Pharmacy Records  ketoconazole (NIZORAL) 2 % cream [id number] No APPLY TO GLANS OF PENIS DAILY Roger Limes, MD Past Week Active Spouse/Significant Other, Multiple Informants, Pharmacy Records  levothyroxine (SYNTHROID) 88 MCG tablet [id number] No TAKE 1 TABLET  EVERY DAY ON EMPTY STOMACHWITH A GLASS OF WATER AT LEAST 30-60 MINBEFORE BREAKFAST Roger Limes, MD Past Week Active Multiple Informants, Pharmacy Records, Spouse/Significant Other  memantine (NAMENDA) 10 MG tablet [id number] No TAKE 1 TABLET BY MOUTH TWO TIMES DAILY  Patient taking differently: Take 10 mg by mouth 2 (two) times daily.   Roger Limes, MD Past Week Active Spouse/Significant Other, Multiple Informants, Pharmacy Records  MULTIPLE VITAMIN PO [id number] No Take 1 tablet by mouth daily. [provider] Past Week Active Spouse/Significant Other, Multiple Informants, Pharmacy Records           Med Note Roger Cook Nov 19, 2017  4:01 PM)    Clent Demark [id number] No CPAP (Free Text) - Historical Medication  As directed  Started 22-Sep-1994 Active [provider] unk unk Active Spouse/Significant Other, Multiple Informants, Pharmacy Records           Med Note Marcelino Freestone   Thu Nov 19, 2017  4:01 PM)    PARoxetine (PAXIL) 10 MG tablet [id number] No Take 10 mg by mouth daily. [provider] Past Week Active Multiple Informants, Pharmacy Records, Spouse/Significant Other  pravastatin (PRAVACHOL) 40 MG tablet [id number] No TAKE 1 TABLET BY MOUTH DAILY Fisher, Demetrios Isaacs, MD Past Week Active Multiple Informants, Pharmacy Records, Spouse/Significant Other  PREVIDENT 5000 DRY MOUTH 1.1 % GEL dental gel [id number] No Place 1 application  onto teeth 2 (two) times daily. [provider] Past Week Active Spouse/Significant Other, Multiple Informants, Pharmacy Records            Home Care and Equipment/Supplies: Were Home Health Services Ordered?: Yes Name of Home Health Agency:: [facility] Has Agency set up a time to come to your home?: Yes First Home Health Visit Date: 01/29/23 Any new equipment or medical supplies ordered?: No  Functional Questionnaire: Do you need assistance with bathing/showering or dressing?: Yes Do you need assistance with meal preparation?: Yes Do you need assistance with eating?: No Do you have  difficulty maintaining continence: No Do you need assistance with getting out of bed/getting out of a chair/moving?: Yes Do you have difficulty managing or taking your medications?: Yes  Follow up appointments reviewed: PCP Follow-up appointment confirmed?: Yes Date of PCP follow-up appointment?: 01/29/23 Follow-up Provider: Merita Norton Specialist [facility] Follow-up appointment confirmed?: Yes Date of Specialist follow-up appointment?: 02/19/23 Follow-Up Specialty Provider:: Dr. Juliann Pares Do you need transportation to your follow-up appointment?: No Do you understand care options if your condition(s) worsen?: Yes-patient verbalized understanding  SDOH Interventions Today    Flowsheet Row Most Recent Value  SDOH Interventions   Food Insecurity Interventions Intervention Not Indicated  Housing Interventions Intervention Not Indicated  Transportation Interventions Intervention Not Indicated  Financial Strain Interventions Intervention Not Indicated      Interventions Today    Flowsheet Row Most Recent Value  Chronic Disease   Chronic disease during today's visit Hypertension (HTN), Other  [Dementia]  Pharmacy Interventions   Pharmacy Dicussed/Reviewed Pharmacy Topics Discussed       TOC Interventions Today    Flowsheet Row Most Recent Value  TOC Interventions   TOC Interventions Discussed/Reviewed TOC Interventions Discussed, Arranged PCP follow up within 7 days/Care Guide scheduled      Jodelle Gross, RN, BSN, CCM Care Management Coordinator [facility] Phone: [phone]/Fax: [phone]

## 2023-01-28 NOTE — Progress Notes (Signed)
Reviewed and agree with patient's detailed plan of care. Home Health Certification and Plan of Care from 12/26/2022 through 02/23/2023 signed and sent to medical records to be faxed to home health agency.

## 2023-01-28 NOTE — Patient Instructions (Signed)
.   Please review the attached list of medications and notify my office if there are any errors.   . Please bring all of your medications to every appointment so we can make sure that our medication list is the same as yours.   

## 2023-01-29 ENCOUNTER — Ambulatory Visit (INDEPENDENT_AMBULATORY_CARE_PROVIDER_SITE_OTHER): Payer: Medicare Other | Admitting: Family Medicine

## 2023-01-29 ENCOUNTER — Encounter: Payer: Self-pay | Admitting: Family Medicine

## 2023-01-29 VITALS — BP 124/62 | HR 97 | Temp 98.0°F | Resp 15 | Ht 67.0 in | Wt 150.0 lb

## 2023-01-29 DIAGNOSIS — I959 Hypotension, unspecified: Secondary | ICD-10-CM | POA: Diagnosis not present

## 2023-01-29 DIAGNOSIS — Z09 Encounter for follow-up examination after completed treatment for conditions other than malignant neoplasm: Secondary | ICD-10-CM

## 2023-01-29 DIAGNOSIS — Z87898 Personal history of other specified conditions: Secondary | ICD-10-CM | POA: Diagnosis not present

## 2023-01-29 LAB — CULTURE, BLOOD (ROUTINE X 2)
Culture: NO GROWTH
Culture: NO GROWTH
Special Requests: ADEQUATE

## 2023-01-29 NOTE — Assessment & Plan Note (Signed)
Acute, resolved Now off BB Continue BP monitoring at home Keep appts with Dr Juliann Pares at [facility]

## 2023-01-29 NOTE — Progress Notes (Signed)
Established patient visit  Patient: Roger Cook   DOB: 11/30/1926   89 y.o. Male  MRN: [id number] Visit Date: 01/29/2023  Today's healthcare provider: Jacky Kindle, FNP  Introduced to nurse practitioner role and practice setting.  All questions answered.  Discussed provider/patient relationship and expectations.  Subjective    HPI  Follow up Hospitalization  Patient was admitted to [facility] on 01/25/2023 and discharged on 01/27/2023. He was treated for syncope, BP.. Treatment for this included medications. He reports good compliance with treatment. He reports this condition is improved.  ----------------------------------------------------------------------------------------- -   Medications: Outpatient Medications Prior to Visit  Medication Sig   acetaminophen (TYLENOL) 325 MG tablet Take 2 tablets (650 mg total) by mouth every 6 (six) hours as needed for mild pain (or Fever >/= 101).   amLODipine (NORVASC) 5 MG tablet Take 1 tablet (5 mg total) by mouth every evening. Skip the dose if systolic BP less than 140 mmHg   apixaban (ELIQUIS) 5 MG TABS tablet Take 1 tablet (5 mg total) by mouth 2 (two) times daily.   Apoaequorin (PREVAGEN) 10 MG CAPS Take 10 mg by mouth daily.   ARTIFICIAL TEAR SOLUTION OP Place 1 drop into both eyes 2 (two) times daily as needed (dry eyes).   brimonidine (ALPHAGAN) 0.2 % ophthalmic solution Place 1 drop into the left eye 2 (two) times daily.   Calcium Carbonate-Vit D-Min (CALCIUM 1200 PO) Take 1,200 mg by mouth daily.   cyanocobalamin (VITAMIN B12) 1000 MCG tablet Take 1 tablet (1,000 mcg total) by mouth daily.   ferrous sulfate 325 (65 FE) MG tablet Take 1 tablet (325 mg total) by mouth every other day.   fluticasone (FLONASE) 50 MCG/ACT nasal spray Place 2 sprays into both nostrils daily as needed for allergies.   isosorbide mononitrate (IMDUR) 60 MG 24 hr tablet TAKE ONE TABLET EVERY DAY   ketoconazole (NIZORAL) 2 % cream APPLY TO  GLANS OF PENIS DAILY   levothyroxine (SYNTHROID) 88 MCG tablet TAKE 1 TABLET EVERY DAY ON EMPTY STOMACHWITH A GLASS OF WATER AT LEAST 30-60 MINBEFORE BREAKFAST   memantine (NAMENDA) 10 MG tablet TAKE 1 TABLET BY MOUTH TWO TIMES DAILY (Patient taking differently: Take 10 mg by mouth 2 (two) times daily.)   MULTIPLE VITAMIN PO Take 1 tablet by mouth daily.   NON FORMULARY CPAP (Free Text) - Historical Medication  As directed  Started 22-Sep-1994 Active   PARoxetine (PAXIL) 10 MG tablet Take 10 mg by mouth daily.   pravastatin (PRAVACHOL) 40 MG tablet TAKE 1 TABLET BY MOUTH DAILY   PREVIDENT 5000 DRY MOUTH 1.1 % GEL dental gel Place 1 application  onto teeth 2 (two) times daily.   No facility-administered medications prior to visit.    Review of Systems  All other systems reviewed and are negative.    Objective    BP 124/62 (BP Location: Right Arm, Patient Position: Sitting, Cuff Size: Normal)   Pulse 97   Temp 98 F (36.7 C) (Oral)   Resp 15   Ht 5\' 7"  (1.702 m)   Wt 150 lb (68 kg)   SpO2 98%   BMI 23.49 kg/m   Physical Exam Vitals and nursing note reviewed.  Constitutional:      Appearance: Normal appearance. He is normal weight.  HENT:     Head: Normocephalic and atraumatic.  Cardiovascular:     Rate and Rhythm: Normal rate and regular rhythm.     Pulses: Normal pulses.  Heart sounds: Normal heart sounds.  Pulmonary:     Effort: Pulmonary effort is normal.     Breath sounds: Normal breath sounds.  Musculoskeletal:        General: Normal range of motion.     Cervical back: Normal range of motion.  Skin:    General: Skin is warm and dry.     Capillary Refill: Capillary refill takes less than 2 seconds.  Neurological:     Mental Status: He is alert and oriented to person, place, and time. Mental status is at baseline.  Psychiatric:        Mood and Affect: Mood normal.        Behavior: Behavior normal.        Thought Content: Thought content normal.        Judgment:  Judgment normal.     No results found for any visits on 01/29/23.  Assessment & Plan     Problem List Items Addressed This Visit       Cardiovascular and Mediastinum   Transient hypotension    Resolved; stable on current regimen Defer additional medication changes at this time Remains on norvasc 5 mg; has not needed to hold dose due to BP <140 SBP; remains on imdur at 60 mg daily has also stopped aricept. Wife unclear if pt did start paxil last month; ordered at 10 mg.       Relevant Orders   Basic Metabolic Panel (BMET)     Other   History of bradycardia    Acute, resolved Now off BB Continue BP monitoring at home Keep appts with Dr Juliann Pares at [facility]      Relevant Orders   Basic Metabolic Panel (BMET)   History of syncope - Primary    Unclear etiology- hypotension vs bradycardia vs AMS in setting of both Resolved Pt is Awake, Alert, at baseline Responding to questions appropriately; normal strength with use of walker       Relevant Orders   Basic Metabolic Panel (BMET)   Hospital discharge follow-up    [facility] admission in setting of bradycardia and hypotension Negative infectious workup Medications have been adjusted Repeat BMP for elevated creatinine       Relevant Orders   Basic Metabolic Panel (BMET)   No follow-ups on file.     Leilani Merl, FNP, have reviewed all documentation for this visit. The documentation on 01/29/23 for the exam, diagnosis, procedures, and orders are all accurate and complete.  Jacky Kindle, FNP  [facility] [phone] (phone) [phone] (fax)  [facility]

## 2023-01-29 NOTE — Assessment & Plan Note (Signed)
[facility] admission in setting of bradycardia and hypotension Negative infectious workup Medications have been adjusted Repeat BMP for elevated creatinine

## 2023-01-29 NOTE — Assessment & Plan Note (Signed)
Resolved; stable on current regimen Defer additional medication changes at this time Remains on norvasc 5 mg; has not needed to hold dose due to BP <140 SBP; remains on imdur at 60 mg daily has also stopped aricept. Wife unclear if pt did start paxil last month; ordered at 10 mg.

## 2023-01-29 NOTE — Assessment & Plan Note (Signed)
Unclear etiology- hypotension vs bradycardia vs AMS in setting of both Resolved Pt is Awake, Alert, at baseline Responding to questions appropriately; normal strength with use of walker

## 2023-01-30 DIAGNOSIS — E039 Hypothyroidism, unspecified: Secondary | ICD-10-CM | POA: Diagnosis not present

## 2023-01-30 DIAGNOSIS — I2699 Other pulmonary embolism without acute cor pulmonale: Secondary | ICD-10-CM | POA: Diagnosis not present

## 2023-01-30 DIAGNOSIS — D631 Anemia in chronic kidney disease: Secondary | ICD-10-CM | POA: Diagnosis not present

## 2023-01-30 DIAGNOSIS — I129 Hypertensive chronic kidney disease with stage 1 through stage 4 chronic kidney disease, or unspecified chronic kidney disease: Secondary | ICD-10-CM | POA: Diagnosis not present

## 2023-01-30 DIAGNOSIS — F0280 Dementia in other diseases classified elsewhere without behavioral disturbance: Secondary | ICD-10-CM | POA: Diagnosis not present

## 2023-01-30 DIAGNOSIS — N1831 Chronic kidney disease, stage 3a: Secondary | ICD-10-CM | POA: Diagnosis not present

## 2023-01-30 LAB — BASIC METABOLIC PANEL
BUN/Creatinine Ratio: 15 (ref 10–24)
BUN: 22 mg/dL (ref 10–36)
CO2: 21 mmol/L (ref 20–29)
Calcium: 9.5 mg/dL (ref 8.6–10.2)
Chloride: 107 mmol/L — ABNORMAL HIGH (ref 96–106)
Creatinine, Ser: 1.48 mg/dL — ABNORMAL HIGH (ref 0.76–1.27)
Glucose: 111 mg/dL — ABNORMAL HIGH (ref 70–99)
Potassium: 4.5 mmol/L (ref 3.5–5.2)
Sodium: 144 mmol/L (ref 134–144)
eGFR: 44 mL/min/{1.73_m2} — ABNORMAL LOW (ref >59)

## 2023-01-30 NOTE — Progress Notes (Signed)
Continue to emphasize water of 48 oz per day to assist with kidney filtration given continued elevation of creatinine; would like to get back to baseline of 1.25-1.3. Labs otherwise stable.

## 2023-02-02 ENCOUNTER — Telehealth: Payer: Self-pay | Admitting: Family Medicine

## 2023-02-02 DIAGNOSIS — F0280 Dementia in other diseases classified elsewhere without behavioral disturbance: Secondary | ICD-10-CM | POA: Diagnosis not present

## 2023-02-02 DIAGNOSIS — I2699 Other pulmonary embolism without acute cor pulmonale: Secondary | ICD-10-CM | POA: Diagnosis not present

## 2023-02-02 DIAGNOSIS — E039 Hypothyroidism, unspecified: Secondary | ICD-10-CM | POA: Diagnosis not present

## 2023-02-02 DIAGNOSIS — D631 Anemia in chronic kidney disease: Secondary | ICD-10-CM | POA: Diagnosis not present

## 2023-02-02 DIAGNOSIS — N1831 Chronic kidney disease, stage 3a: Secondary | ICD-10-CM | POA: Diagnosis not present

## 2023-02-02 DIAGNOSIS — I129 Hypertensive chronic kidney disease with stage 1 through stage 4 chronic kidney disease, or unspecified chronic kidney disease: Secondary | ICD-10-CM | POA: Diagnosis not present

## 2023-02-02 NOTE — Telephone Encounter (Signed)
That's fine

## 2023-02-02 NOTE — Telephone Encounter (Signed)
Home Health Verbal Orders - Caller/Agency: Verlon Au with [facility] Callback Number: [phone]  Requesting Skilled Nursing Frequency: 1 x wk for 4 wks to monitor bp and educate on medication changes  Please assist further

## 2023-02-02 NOTE — Telephone Encounter (Signed)
Thayer Ohm calling from [facility] is calling to request verbal Orders for PT 1 w 4 Cb- [phone] Verbals ok on VM

## 2023-02-03 NOTE — Telephone Encounter (Signed)
Verbal consent given.

## 2023-02-04 DIAGNOSIS — D631 Anemia in chronic kidney disease: Secondary | ICD-10-CM | POA: Diagnosis not present

## 2023-02-04 DIAGNOSIS — I2699 Other pulmonary embolism without acute cor pulmonale: Secondary | ICD-10-CM | POA: Diagnosis not present

## 2023-02-04 DIAGNOSIS — E039 Hypothyroidism, unspecified: Secondary | ICD-10-CM | POA: Diagnosis not present

## 2023-02-04 DIAGNOSIS — F0280 Dementia in other diseases classified elsewhere without behavioral disturbance: Secondary | ICD-10-CM | POA: Diagnosis not present

## 2023-02-04 DIAGNOSIS — N1831 Chronic kidney disease, stage 3a: Secondary | ICD-10-CM | POA: Diagnosis not present

## 2023-02-04 DIAGNOSIS — I129 Hypertensive chronic kidney disease with stage 1 through stage 4 chronic kidney disease, or unspecified chronic kidney disease: Secondary | ICD-10-CM | POA: Diagnosis not present

## 2023-02-06 ENCOUNTER — Telehealth: Payer: Self-pay | Admitting: Family Medicine

## 2023-02-06 DIAGNOSIS — R9431 Abnormal electrocardiogram [ECG] [EKG]: Secondary | ICD-10-CM | POA: Diagnosis not present

## 2023-02-06 DIAGNOSIS — I25118 Atherosclerotic heart disease of native coronary artery with other forms of angina pectoris: Secondary | ICD-10-CM | POA: Diagnosis not present

## 2023-02-06 DIAGNOSIS — F39 Unspecified mood [affective] disorder: Secondary | ICD-10-CM | POA: Diagnosis not present

## 2023-02-06 DIAGNOSIS — E7800 Pure hypercholesterolemia, unspecified: Secondary | ICD-10-CM | POA: Diagnosis not present

## 2023-02-06 DIAGNOSIS — R413 Other amnesia: Secondary | ICD-10-CM | POA: Diagnosis not present

## 2023-02-06 DIAGNOSIS — R55 Syncope and collapse: Secondary | ICD-10-CM | POA: Diagnosis not present

## 2023-02-06 DIAGNOSIS — I10 Essential (primary) hypertension: Secondary | ICD-10-CM | POA: Diagnosis not present

## 2023-02-06 DIAGNOSIS — I209 Angina pectoris, unspecified: Secondary | ICD-10-CM | POA: Diagnosis not present

## 2023-02-06 NOTE — Telephone Encounter (Signed)
Esther OT with [facility] is calling to request a new order for [facility].   Pt is requesting OT eval to be moved to next week. [phone]  Verbal Ok on VM

## 2023-02-06 NOTE — Telephone Encounter (Signed)
Ok for verbal orders.    Tyrica Afzal Simmons-Robinson, MD   Family Practice  

## 2023-02-07 NOTE — Telephone Encounter (Signed)
That's fine

## 2023-02-09 DIAGNOSIS — I129 Hypertensive chronic kidney disease with stage 1 through stage 4 chronic kidney disease, or unspecified chronic kidney disease: Secondary | ICD-10-CM | POA: Diagnosis not present

## 2023-02-09 DIAGNOSIS — I2699 Other pulmonary embolism without acute cor pulmonale: Secondary | ICD-10-CM | POA: Diagnosis not present

## 2023-02-09 DIAGNOSIS — D631 Anemia in chronic kidney disease: Secondary | ICD-10-CM | POA: Diagnosis not present

## 2023-02-09 DIAGNOSIS — F0280 Dementia in other diseases classified elsewhere without behavioral disturbance: Secondary | ICD-10-CM | POA: Diagnosis not present

## 2023-02-09 DIAGNOSIS — E039 Hypothyroidism, unspecified: Secondary | ICD-10-CM | POA: Diagnosis not present

## 2023-02-09 DIAGNOSIS — N1831 Chronic kidney disease, stage 3a: Secondary | ICD-10-CM | POA: Diagnosis not present

## 2023-02-10 NOTE — Telephone Encounter (Signed)
Returned [employer] call and gave verbal order.

## 2023-02-11 DIAGNOSIS — F0280 Dementia in other diseases classified elsewhere without behavioral disturbance: Secondary | ICD-10-CM | POA: Diagnosis not present

## 2023-02-11 DIAGNOSIS — N1831 Chronic kidney disease, stage 3a: Secondary | ICD-10-CM | POA: Diagnosis not present

## 2023-02-11 DIAGNOSIS — E039 Hypothyroidism, unspecified: Secondary | ICD-10-CM | POA: Diagnosis not present

## 2023-02-11 DIAGNOSIS — I129 Hypertensive chronic kidney disease with stage 1 through stage 4 chronic kidney disease, or unspecified chronic kidney disease: Secondary | ICD-10-CM | POA: Diagnosis not present

## 2023-02-11 DIAGNOSIS — D631 Anemia in chronic kidney disease: Secondary | ICD-10-CM | POA: Diagnosis not present

## 2023-02-11 DIAGNOSIS — I2699 Other pulmonary embolism without acute cor pulmonale: Secondary | ICD-10-CM | POA: Diagnosis not present

## 2023-02-13 ENCOUNTER — Telehealth: Payer: Self-pay

## 2023-02-13 DIAGNOSIS — E039 Hypothyroidism, unspecified: Secondary | ICD-10-CM | POA: Diagnosis not present

## 2023-02-13 DIAGNOSIS — I2699 Other pulmonary embolism without acute cor pulmonale: Secondary | ICD-10-CM | POA: Diagnosis not present

## 2023-02-13 DIAGNOSIS — I129 Hypertensive chronic kidney disease with stage 1 through stage 4 chronic kidney disease, or unspecified chronic kidney disease: Secondary | ICD-10-CM | POA: Diagnosis not present

## 2023-02-13 DIAGNOSIS — F0280 Dementia in other diseases classified elsewhere without behavioral disturbance: Secondary | ICD-10-CM | POA: Diagnosis not present

## 2023-02-13 DIAGNOSIS — D631 Anemia in chronic kidney disease: Secondary | ICD-10-CM | POA: Diagnosis not present

## 2023-02-13 DIAGNOSIS — N1831 Chronic kidney disease, stage 3a: Secondary | ICD-10-CM | POA: Diagnosis not present

## 2023-02-13 NOTE — Telephone Encounter (Unsigned)
Copied from CRM [id number]. Topic: General - Other >> Feb 13, 2023  3:38 PM Epimenio Foot F wrote: Pt's daughter Elease Hashimoto is calling in because pt was in the hospital and they asked for a DNR form. They were supposed to get it before they left but they didn't get the form and when they contacted the hospital to get the form they were told they needed to get it from the pt's PCP. Per Elease Hashimoto the hospital said the form is in Epic and Dr. Sherrie Mustache would be able to sign and send it. Elease Hashimoto is asking for a follow up call to confirm this information. Please advise. Elease Hashimoto can be reached at [phone]

## 2023-02-13 NOTE — Telephone Encounter (Unsigned)
Copied from CRM #[id number]. Topic: General - Other >> Feb 13, 2023  3:38 PM Santiya F wrote: Pt's daughter Patricia is calling in because pt was in the hospital and they asked for a DNR form. They were supposed to get it before they left but they didn't get the form and when they contacted the hospital to get the form they were told they needed to get it from the pt's PCP. Per Patricia the hospital said the form is in Epic and Dr. Fisher would be able to sign and send it. Patricia is asking for a follow up call to confirm this information. Please advise. Patricia can be reached at [phone] 

## 2023-02-18 ENCOUNTER — Telehealth: Payer: Self-pay | Admitting: Family Medicine

## 2023-02-18 ENCOUNTER — Telehealth: Payer: Self-pay

## 2023-02-18 DIAGNOSIS — F0280 Dementia in other diseases classified elsewhere without behavioral disturbance: Secondary | ICD-10-CM | POA: Diagnosis not present

## 2023-02-18 DIAGNOSIS — I129 Hypertensive chronic kidney disease with stage 1 through stage 4 chronic kidney disease, or unspecified chronic kidney disease: Secondary | ICD-10-CM | POA: Diagnosis not present

## 2023-02-18 DIAGNOSIS — I2699 Other pulmonary embolism without acute cor pulmonale: Secondary | ICD-10-CM | POA: Diagnosis not present

## 2023-02-18 DIAGNOSIS — E039 Hypothyroidism, unspecified: Secondary | ICD-10-CM | POA: Diagnosis not present

## 2023-02-18 DIAGNOSIS — N1831 Chronic kidney disease, stage 3a: Secondary | ICD-10-CM | POA: Diagnosis not present

## 2023-02-18 DIAGNOSIS — D631 Anemia in chronic kidney disease: Secondary | ICD-10-CM | POA: Diagnosis not present

## 2023-02-18 NOTE — Telephone Encounter (Signed)
Copied from CRM [id number]. Topic: Quick Communication - Home Health Verbal Orders >> Feb 18, 2023 12:49 PM Macon Large wrote: Caller/Agency: Tiffany with [facility]  Callback Number: [phone] Option 2 Requesting OT/PT/Skilled Nursing/Social Work/Speech Therapy: PT Frequency: 1 x 4

## 2023-02-18 NOTE — Telephone Encounter (Signed)
Called Tiffany with [facility] and gave verbal orders.

## 2023-02-18 NOTE — Telephone Encounter (Signed)
Copied from CRM [id number]. Topic: General - Other >> Feb 18, 2023 11:34 AM Alfred Levins wrote: Per Darral Dash of [facility] .Marland KitchenMarland KitchenEvaluation completed and he is not in need of OT services

## 2023-02-18 NOTE — Telephone Encounter (Signed)
That's fine

## 2023-02-19 DIAGNOSIS — I25118 Atherosclerotic heart disease of native coronary artery with other forms of angina pectoris: Secondary | ICD-10-CM | POA: Diagnosis not present

## 2023-02-19 DIAGNOSIS — R55 Syncope and collapse: Secondary | ICD-10-CM | POA: Diagnosis not present

## 2023-02-19 DIAGNOSIS — E7800 Pure hypercholesterolemia, unspecified: Secondary | ICD-10-CM | POA: Diagnosis not present

## 2023-02-19 DIAGNOSIS — Z86718 Personal history of other venous thrombosis and embolism: Secondary | ICD-10-CM | POA: Diagnosis not present

## 2023-02-19 DIAGNOSIS — R413 Other amnesia: Secondary | ICD-10-CM | POA: Diagnosis not present

## 2023-02-19 DIAGNOSIS — I10 Essential (primary) hypertension: Secondary | ICD-10-CM | POA: Diagnosis not present

## 2023-02-23 DIAGNOSIS — E039 Hypothyroidism, unspecified: Secondary | ICD-10-CM | POA: Diagnosis not present

## 2023-02-23 DIAGNOSIS — F0280 Dementia in other diseases classified elsewhere without behavioral disturbance: Secondary | ICD-10-CM | POA: Diagnosis not present

## 2023-02-23 DIAGNOSIS — I2699 Other pulmonary embolism without acute cor pulmonale: Secondary | ICD-10-CM | POA: Diagnosis not present

## 2023-02-23 DIAGNOSIS — I129 Hypertensive chronic kidney disease with stage 1 through stage 4 chronic kidney disease, or unspecified chronic kidney disease: Secondary | ICD-10-CM | POA: Diagnosis not present

## 2023-02-23 DIAGNOSIS — N1831 Chronic kidney disease, stage 3a: Secondary | ICD-10-CM | POA: Diagnosis not present

## 2023-02-23 DIAGNOSIS — D631 Anemia in chronic kidney disease: Secondary | ICD-10-CM | POA: Diagnosis not present

## 2023-02-23 NOTE — Telephone Encounter (Signed)
DNR is ready to pick up at front desk

## 2023-02-24 NOTE — Telephone Encounter (Signed)
Advised 

## 2023-03-30 ENCOUNTER — Telehealth: Payer: Self-pay

## 2023-03-30 NOTE — Telephone Encounter (Signed)
Copied from CRM [id number]. Topic: General - Other >> Mar 30, 2023 11:43 AM Carrielelia G wrote: Letter of incapacity needed for the power of attorney(s) ( daughter and wife)  so that they can make decisions for pt Roger Cook.

## 2023-04-08 ENCOUNTER — Inpatient Hospital Stay: Payer: Medicare Other

## 2023-04-08 ENCOUNTER — Other Ambulatory Visit: Payer: Self-pay

## 2023-04-08 ENCOUNTER — Inpatient Hospital Stay
Admission: EM | Admit: 2023-04-08 | Discharge: 2023-04-10 | DRG: 315 | Disposition: A | Discharge status: Home / Self Care | Payer: Medicare Other | Attending: Hospitalist | Admitting: Hospitalist

## 2023-04-08 ENCOUNTER — Emergency Department: Payer: Medicare Other

## 2023-04-08 DIAGNOSIS — Z8614 Personal history of Methicillin resistant Staphylococcus aureus infection: Secondary | ICD-10-CM | POA: Diagnosis not present

## 2023-04-08 DIAGNOSIS — I700 Atherosclerosis of aorta: Secondary | ICD-10-CM | POA: Diagnosis not present

## 2023-04-08 DIAGNOSIS — I2510 Atherosclerotic heart disease of native coronary artery without angina pectoris: Secondary | ICD-10-CM | POA: Diagnosis present

## 2023-04-08 DIAGNOSIS — F0390 Unspecified dementia without behavioral disturbance: Secondary | ICD-10-CM | POA: Diagnosis present

## 2023-04-08 DIAGNOSIS — D62 Acute posthemorrhagic anemia: Secondary | ICD-10-CM | POA: Diagnosis present

## 2023-04-08 DIAGNOSIS — Z87442 Personal history of urinary calculi: Secondary | ICD-10-CM

## 2023-04-08 DIAGNOSIS — Z66 Do not resuscitate: Secondary | ICD-10-CM | POA: Diagnosis present

## 2023-04-08 DIAGNOSIS — Z8249 Family history of ischemic heart disease and other diseases of the circulatory system: Secondary | ICD-10-CM

## 2023-04-08 DIAGNOSIS — K922 Gastrointestinal hemorrhage, unspecified: Secondary | ICD-10-CM | POA: Diagnosis not present

## 2023-04-08 DIAGNOSIS — Z888 Allergy status to other drugs, medicaments and biological substances status: Secondary | ICD-10-CM

## 2023-04-08 DIAGNOSIS — I129 Hypertensive chronic kidney disease with stage 1 through stage 4 chronic kidney disease, or unspecified chronic kidney disease: Secondary | ICD-10-CM | POA: Diagnosis present

## 2023-04-08 DIAGNOSIS — Z8719 Personal history of other diseases of the digestive system: Secondary | ICD-10-CM

## 2023-04-08 DIAGNOSIS — Z882 Allergy status to sulfonamides status: Secondary | ICD-10-CM

## 2023-04-08 DIAGNOSIS — Z8546 Personal history of malignant neoplasm of prostate: Secondary | ICD-10-CM

## 2023-04-08 DIAGNOSIS — Z960 Presence of urogenital implants: Secondary | ICD-10-CM | POA: Diagnosis not present

## 2023-04-08 DIAGNOSIS — I672 Cerebral atherosclerosis: Secondary | ICD-10-CM | POA: Diagnosis not present

## 2023-04-08 DIAGNOSIS — R42 Dizziness and giddiness: Secondary | ICD-10-CM

## 2023-04-08 DIAGNOSIS — Z881 Allergy status to other antibiotic agents status: Secondary | ICD-10-CM

## 2023-04-08 DIAGNOSIS — R0689 Other abnormalities of breathing: Secondary | ICD-10-CM | POA: Diagnosis not present

## 2023-04-08 DIAGNOSIS — D649 Anemia, unspecified: Secondary | ICD-10-CM | POA: Diagnosis not present

## 2023-04-08 DIAGNOSIS — Z7901 Long term (current) use of anticoagulants: Secondary | ICD-10-CM | POA: Diagnosis not present

## 2023-04-08 DIAGNOSIS — Z883 Allergy status to other anti-infective agents status: Secondary | ICD-10-CM

## 2023-04-08 DIAGNOSIS — Z87891 Personal history of nicotine dependence: Secondary | ICD-10-CM

## 2023-04-08 DIAGNOSIS — R55 Syncope and collapse: Principal | ICD-10-CM

## 2023-04-08 DIAGNOSIS — E785 Hyperlipidemia, unspecified: Secondary | ICD-10-CM | POA: Diagnosis not present

## 2023-04-08 DIAGNOSIS — I2699 Other pulmonary embolism without acute cor pulmonale: Secondary | ICD-10-CM | POA: Diagnosis present

## 2023-04-08 DIAGNOSIS — Z86711 Personal history of pulmonary embolism: Secondary | ICD-10-CM

## 2023-04-08 DIAGNOSIS — D509 Iron deficiency anemia, unspecified: Secondary | ICD-10-CM | POA: Diagnosis present

## 2023-04-08 DIAGNOSIS — E039 Hypothyroidism, unspecified: Secondary | ICD-10-CM | POA: Diagnosis present

## 2023-04-08 DIAGNOSIS — G4733 Obstructive sleep apnea (adult) (pediatric): Secondary | ICD-10-CM | POA: Diagnosis present

## 2023-04-08 DIAGNOSIS — Z79890 Hormone replacement therapy (postmenopausal): Secondary | ICD-10-CM | POA: Diagnosis not present

## 2023-04-08 DIAGNOSIS — K219 Gastro-esophageal reflux disease without esophagitis: Secondary | ICD-10-CM | POA: Diagnosis present

## 2023-04-08 DIAGNOSIS — Z79899 Other long term (current) drug therapy: Secondary | ICD-10-CM

## 2023-04-08 DIAGNOSIS — I959 Hypotension, unspecified: Principal | ICD-10-CM | POA: Diagnosis present

## 2023-04-08 DIAGNOSIS — N1831 Chronic kidney disease, stage 3a: Secondary | ICD-10-CM | POA: Diagnosis present

## 2023-04-08 DIAGNOSIS — R4182 Altered mental status, unspecified: Secondary | ICD-10-CM | POA: Diagnosis not present

## 2023-04-08 DIAGNOSIS — Z8701 Personal history of pneumonia (recurrent): Secondary | ICD-10-CM

## 2023-04-08 DIAGNOSIS — R0902 Hypoxemia: Secondary | ICD-10-CM | POA: Diagnosis not present

## 2023-04-08 DIAGNOSIS — Z8673 Personal history of transient ischemic attack (TIA), and cerebral infarction without residual deficits: Secondary | ICD-10-CM | POA: Diagnosis not present

## 2023-04-08 DIAGNOSIS — D500 Iron deficiency anemia secondary to blood loss (chronic): Secondary | ICD-10-CM | POA: Diagnosis present

## 2023-04-08 DIAGNOSIS — I10 Essential (primary) hypertension: Secondary | ICD-10-CM | POA: Diagnosis present

## 2023-04-08 DIAGNOSIS — Z906 Acquired absence of other parts of urinary tract: Secondary | ICD-10-CM

## 2023-04-08 DIAGNOSIS — K921 Melena: Secondary | ICD-10-CM | POA: Diagnosis present

## 2023-04-08 DIAGNOSIS — Z9049 Acquired absence of other specified parts of digestive tract: Secondary | ICD-10-CM

## 2023-04-08 LAB — CBC
HCT: 26.1 % — ABNORMAL LOW (ref 39.0–52.0)
HCT: 24.1 % — ABNORMAL LOW (ref 39.0–52.0)
Hemoglobin: 8.1 g/dL — ABNORMAL LOW (ref 13.0–17.0)
Hemoglobin: 7.6 g/dL — ABNORMAL LOW (ref 13.0–17.0)
MCH: 31.3 pg (ref 26.0–34.0)
MCH: 31.4 pg (ref 26.0–34.0)
MCHC: 31 g/dL (ref 30.0–36.0)
MCHC: 31.5 g/dL (ref 30.0–36.0)
MCV: 100.8 fL — ABNORMAL HIGH (ref 80.0–100.0)
MCV: 99.6 fL (ref 80.0–100.0)
Platelets: 243 10*3/uL (ref 150–400)
Platelets: 244 10*3/uL (ref 150–400)
RBC: 2.59 MIL/uL — ABNORMAL LOW (ref 4.22–5.81)
RBC: 2.42 MIL/uL — ABNORMAL LOW (ref 4.22–5.81)
RDW: 14.5 % (ref 11.5–15.5)
RDW: 14.5 % (ref 11.5–15.5)
WBC: 5.7 10*3/uL (ref 4.0–10.5)
WBC: 5.3 10*3/uL (ref 4.0–10.5)
nRBC: 0 % (ref 0.0–0.2)
nRBC: 0 % (ref 0.0–0.2)

## 2023-04-08 LAB — BASIC METABOLIC PANEL
Anion gap: 5 (ref 5–15)
BUN: 26 mg/dL — ABNORMAL HIGH (ref 8–23)
CO2: 23 mmol/L (ref 22–32)
Calcium: 8.2 mg/dL — ABNORMAL LOW (ref 8.9–10.3)
Chloride: 109 mmol/L (ref 98–111)
Creatinine, Ser: 1.41 mg/dL — ABNORMAL HIGH (ref 0.61–1.24)
GFR, Estimated: 46 mL/min — ABNORMAL LOW (ref >60)
Glucose, Bld: 212 mg/dL — ABNORMAL HIGH (ref 70–99)
Potassium: 3.8 mmol/L (ref 3.5–5.1)
Sodium: 137 mmol/L (ref 135–145)

## 2023-04-08 LAB — HEPATIC FUNCTION PANEL
ALT: 16 U/L (ref 0–44)
AST: 24 U/L (ref 15–41)
Albumin: 3.5 g/dL (ref 3.5–5.0)
Alkaline Phosphatase: 47 U/L (ref 38–126)
Bilirubin, Direct: <0.1 mg/dL (ref 0.0–0.2)
Total Bilirubin: 0.2 mg/dL — ABNORMAL LOW (ref 0.3–1.2)
Total Protein: 6.4 g/dL — ABNORMAL LOW (ref 6.5–8.1)

## 2023-04-08 LAB — TROPONIN I (HIGH SENSITIVITY): Troponin I (High Sensitivity): 6 ng/L (ref <18)

## 2023-04-08 LAB — PROTIME-INR
INR: 1.5 — ABNORMAL HIGH (ref 0.8–1.2)
Prothrombin Time: 18 seconds — ABNORMAL HIGH (ref 11.4–15.2)

## 2023-04-08 LAB — APTT: aPTT: 33 seconds (ref 24–36)

## 2023-04-08 MED ORDER — ONDANSETRON HCL 4 MG PO TABS: 4.0000 mg | ORAL_TABLET | Freq: Four times a day (QID) | ORAL | Status: DC | PRN

## 2023-04-08 MED ORDER — PANTOPRAZOLE SODIUM 40 MG IV SOLR: 40.0000 mg | Freq: Two times a day (BID) | INTRAVENOUS | Status: DC

## 2023-04-08 MED ORDER — LACTATED RINGERS IV SOLN: INTRAVENOUS | Status: DC

## 2023-04-08 MED ORDER — MORPHINE SULFATE (PF) 2 MG/ML IV SOLN: 2.0000 mg | INTRAVENOUS | Status: DC | PRN

## 2023-04-08 MED ORDER — ONDANSETRON HCL 4 MG/2ML IJ SOLN: 4.0000 mg | Freq: Four times a day (QID) | INTRAMUSCULAR | Status: DC | PRN

## 2023-04-08 MED ORDER — ACETAMINOPHEN 650 MG RE SUPP: 650.0000 mg | Freq: Four times a day (QID) | RECTAL | Status: DC | PRN

## 2023-04-08 MED ORDER — PANTOPRAZOLE INFUSION (NEW) - SIMPLE MED
8.0000 mg/h | INTRAVENOUS | Status: DC
  Administered 2023-04-08 – 2023-04-09 (×2): 8 mg/h via INTRAVENOUS
  Filled 2023-04-08 (×3): qty 100

## 2023-04-08 MED ORDER — ACETAMINOPHEN 325 MG PO TABS: 650.0000 mg | ORAL_TABLET | Freq: Four times a day (QID) | ORAL | Status: DC | PRN

## 2023-04-08 MED ORDER — PANTOPRAZOLE 80MG IVPB - SIMPLE MED
80.0000 mg | Freq: Once | INTRAVENOUS | Status: AC
  Administered 2023-04-08: 80 mg via INTRAVENOUS
  Filled 2023-04-08: qty 100

## 2023-04-08 NOTE — ED Notes (Signed)
First Nurse Note: Patient to ED via [facility] from home for near syncope. Patient was positive for orthostatics with a BP of 78/36 when standing. Hx of dementia.  22 L Hand with fluids running from EMS.

## 2023-04-08 NOTE — ED Notes (Signed)
Pt bedding and brief change.

## 2023-04-08 NOTE — H&P (Addendum)
History and Physical    Patient: Roger Cook YQI:[id number] DOB: 12/28/1926 DOA: 04/08/2023 DOS: the patient was seen and examined on 04/09/2023 PCP: [name], MD  Patient coming from: Home   Chief Complaint:  Chief Complaint  Patient presents with   Near Syncope    Patient with dementia arrived by EMS from home; Wife states that she noticed patient holding onto the counter and "pale as a ghost" and he was "very close to losing consciousness"; Upon arrival here, patient has no complaints but does not recall the events (this is baseline for him)    HPI: Roger Cook is a 89 y.o. male with medical history significant for dementia, hypertension, hyperlipidemia, hypothyroid, history of TIA, history of prostate cancer, acute blood loss anemia and GI bleed attributed to known history of AVMs on colonoscopy that was done in 2023 and EGD done in feb 2024 was normal - Dr.Locklear , presents today for hypotension and recurrent syncope.  Patient also diagnosed with severe bilateral pulmonary embolism with right heart strain on Eliquis.  Patient was admitted by me on 5 May for altered mental status and unresponsiveness  Patient has a history of chronic anemia and acute blood loss anemia.  On his last admission patient's unresponsiveness and syncope was attributed to his bradycardia and medication changes made with holding his beta-blockers upon discharge.  Patient's AKI evaluation during previous admission was negative hydronephrosis or obstruction on ultrasound as well. Per  wife she noticed that patient was pale and onto the counter and became very faint.  Patient reported dark stools this morning.Today patient was hypotensive upon standing with blood pressures of 78/36 in the emergency room.    In the emergency room patient is awake cooperative afebrile O2 sats of 97% on room air. CMP today shows CKD with a creatinine of 1.41 EGFR 46, glucose of 212, LFTs within normal limits. CBC shows 2  point drop of hemoglobin from 10.5-8.1 today and MCV of 100.8 normal platelet count and normal white count. In the emergency room patient received Protonix 80 mg and continued on a drip. Most recent 2 d echo done in march 2024 shows: Study Result      ECHOCARDIOGRAM REPORT    Patient Name:   DEBORA SHIRAH Lohse Date of Exam: 12/08/2022 Medical Rec #:  [id number]      Height:       67.0 in Accession #:    [id number]     Weight:       150.0 lb Date of Birth:  April 23, 1927      BSA:          1.790 m Patient Age:    89 years       BP:           162/45 mmHg Patient Gender: M              HR:           72 bpm. Exam Location:  [facility]  Procedure: 2D Echo, Cardiac Doppler and Color Doppler  Indications:     Pulmonary Embolus   History:         Patient has no prior history of Echocardiogram examinations.                  Angina, Signs/Symptoms:Syncope; Risk Factors:Hypertension And Sleep Apnea.   Sonographer:     Mikki Harbor Referring Phys:  [id number] PRINCE T Meriam Sprague Diagnosing Phys: Lorine Bears MD  IMPRESSIONS  1. Left ventricular  ejection fraction, by estimation, is 60 to 65%. The left ventricle has normal function. The left ventricle has no regional wall motion abnormalities. There is mild left ventricular hypertrophy. Left ventricular diastolic parameters are consistent with Grade I diastolic dysfunction (impaired relaxation).  2. Right ventricular systolic function is normal. The right ventricular size is normal. Tricuspid regurgitation signal is inadequate for assessing PA pressure.  3. The mitral valve is normal in structure. No evidence of mitral valve regurgitation. No evidence of mitral stenosis.  4. The aortic valve is normal in structure. Aortic valve regurgitation is trivial. Aortic valve sclerosis/calcification is present, without any evidence of aortic stenosis.  5. The inferior vena cava is normal in size with greater than 50% respiratory variability, suggesting right  atrial pressure of 3 mmHg.   Review of Systems: Review of Systems  Gastrointestinal:  Positive for melena.   Past Medical History:  Diagnosis Date   Anemia    Aortic atherosclerosis (HCC)    Bell palsy    Bilateral renal cysts    Bladder tumor    CAD (coronary artery disease)    DOE (dyspnea on exertion)    Elevated lactic acid level 11/13/2022   GERD (gastroesophageal reflux disease)    History of angina    History of penile implant    History of prostate cancer    Hyperlipidemia    Hypertension    Hypothyroidism    Lipoma    MRSA bacteremia 02/2011   Nephrolithiasis    OSA on CPAP    Pneumonia    as a teenager   Past Surgical History:  Procedure Laterality Date   APPENDECTOMY  2005   CARDIAC CATHETERIZATION N/A 08/13/1990   75% pLCx, 75% mLCx, 25% mLAD, 75% D2, 50% right renal artery; Location: [facility]; Surgeon: Eugenia Pancoast, MD   CATARACT EXTRACTION  2005   also had a macular hole in 2005   CATARACT EXTRACTION  2008   COLONOSCOPY     2003, 2014   COLONOSCOPY WITH PROPOFOL N/A 12/26/2021   Procedure: COLONOSCOPY WITH PROPOFOL;  Surgeon: Midge Minium, MD;  Location: [facility] ENDOSCOPY;  Service: Endoscopy;  Laterality: N/A;   ESOPHAGOGASTRODUODENOSCOPY     2004, 2014   ESOPHAGOGASTRODUODENOSCOPY (EGD) WITH PROPOFOL N/A 12/26/2021   Procedure: ESOPHAGOGASTRODUODENOSCOPY (EGD) WITH PROPOFOL;  Surgeon: Midge Minium, MD;  Location: [facility] ENDOSCOPY;  Service: Endoscopy;  Laterality: N/A;   ESOPHAGOGASTRODUODENOSCOPY (EGD) WITH PROPOFOL N/A 11/15/2022   Procedure: ESOPHAGOGASTRODUODENOSCOPY (EGD) WITH PROPOFOL;  Surgeon: Regis Bill, MD;  Location: [facility] ENDOSCOPY;  Service: Endoscopy;  Laterality: N/A;   HAND SURGERY Left 06/26/2011   Malignancy removed from left hand   NASAL SEPTUM SURGERY  1986   NASAL SINUS SURGERY  1990   penile inplant  1984   polyp of rectum  2003   PROSTATE SURGERY  1975   Abdominal, had to have radiation treatment with the procedure   PROSTATE  SURGERY     TONSILLECTOMY     TRANSURETHRAL RESECTION OF BLADDER TUMOR N/A 03/12/2021   Procedure: TRANSURETHRAL RESECTION OF BLADDER TUMOR (TURBT);  Surgeon: Riki Altes, MD;  Location: [facility] ORS;  Service: Urology;  Laterality: N/A;   Social History:  reports that he quit smoking about 60 years ago. His smoking use included cigarettes. He started smoking about 76 years ago. He has a 16 pack-year smoking history. He has never used smokeless tobacco. He reports that he does not currently use alcohol after a past usage of about 2.0 standard drinks of alcohol  per week. He reports that he does not use drugs.  Allergies  Allergen Reactions   Levofloxacin Swelling    lips swelling   Povidone Iodine Other (See Comments)    Unknown, can not remember   Sulfa Antibiotics     Unknown, can not remember   Benadryl [Diphenhydramine] Rash    Family History  Problem Relation Age of Onset   Lung cancer Mother    Heart disease Father     Prior to Admission medications   Medication Sig Start Date End Date Taking? Authorizing Provider  acetaminophen (TYLENOL) 325 MG tablet Take 2 tablets (650 mg total) by mouth every 6 (six) hours as needed for mild pain (or Fever >/= 101). 12/09/22   Loyce Dys, MD  amLODipine (NORVASC) 5 MG tablet Take 1 tablet (5 mg total) by mouth every evening. Skip the dose if systolic BP less than 140 mmHg 01/27/23 01/27/24  [name], MD  apixaban (ELIQUIS) 5 MG TABS tablet Take 1 tablet (5 mg total) by mouth 2 (two) times daily. 12/17/22   Loyce Dys, MD  Apoaequorin (PREVAGEN) 10 MG CAPS Take 10 mg by mouth daily.    [provider]  ARTIFICIAL TEAR SOLUTION OP Place 1 drop into both eyes 2 (two) times daily as needed (dry eyes).    [provider]  brimonidine (ALPHAGAN) 0.2 % ophthalmic solution Place 1 drop into the left eye 2 (two) times daily. 01/25/21   [provider]  Calcium Carbonate-Vit D-Min (CALCIUM 1200 PO) Take 1,200 mg by mouth  daily.    [provider]  cyanocobalamin (VITAMIN B12) 1000 MCG tablet Take 1 tablet (1,000 mcg total) by mouth daily. 11/18/22   Esaw Grandchild A, DO  ferrous sulfate 325 (65 FE) MG tablet Take 1 tablet (325 mg total) by mouth every other day. 01/05/23   [name], MD  fluticasone (FLONASE) 50 MCG/ACT nasal spray Place 2 sprays into both nostrils daily as needed for allergies. 09/25/21   [name], MD  isosorbide mononitrate (IMDUR) 60 MG 24 hr tablet TAKE ONE TABLET EVERY DAY 10/20/22   [name], MD  ketoconazole (NIZORAL) 2 % cream APPLY TO GLANS OF PENIS DAILY 10/08/22   [name], MD  levothyroxine (SYNTHROID) 88 MCG tablet TAKE 1 TABLET EVERY DAY ON EMPTY STOMACHWITH A GLASS OF WATER AT LEAST 30-60 MINBEFORE BREAKFAST 12/24/22   [name], MD  memantine (NAMENDA) 10 MG tablet TAKE 1 TABLET BY MOUTH TWO TIMES DAILY Patient taking differently: Take 10 mg by mouth 2 (two) times daily. 11/19/21   [name], MD  MULTIPLE VITAMIN PO Take 1 tablet by mouth daily.    [provider]  NON FORMULARY CPAP (Free Text) - Historical Medication  As directed  Started 22-Sep-1994 Active 09/22/1994   [provider]  PARoxetine (PAXIL) 10 MG tablet Take 10 mg by mouth daily.    [provider]  pravastatin (PRAVACHOL) 40 MG tablet TAKE 1 TABLET BY MOUTH DAILY 12/24/22   [name], MD  PREVIDENT 5000 DRY MOUTH 1.1 % GEL dental gel Place 1 application  onto teeth 2 (two) times daily. 02/19/21   [provider]     Vitals:   04/08/23 2245 04/08/23 2300 04/08/23 2330 04/09/23 0000  BP: (!) 173/60 (!) 174/58 (!) 169/59 (!) 164/52  Pulse: 72 (!) 104 71 74  Resp: (!) 21 13 (!) 23 (!) 21  Temp:  TempSrc:      SpO2: 100% (!) 73% 100% 100%   Physical Exam Vitals and nursing note reviewed.  Constitutional:      General: He is not in acute distress. HENT:     Head: Normocephalic and atraumatic.     Right Ear: Hearing  normal.     Left Ear: Hearing normal.     Nose: Nose normal. No nasal deformity.     Mouth/Throat:     Lips: Pink.     Tongue: No lesions.     Pharynx: Oropharynx is clear.  Eyes:     General: Lids are normal.     Extraocular Movements: Extraocular movements intact.  Cardiovascular:     Rate and Rhythm: Normal rate and regular rhythm.     Heart sounds: Normal heart sounds.  Pulmonary:     Effort: Pulmonary effort is normal.     Breath sounds: Normal breath sounds.  Abdominal:     General: Bowel sounds are normal. There is no distension.     Palpations: Abdomen is soft. There is no mass.     Tenderness: There is no abdominal tenderness.  Musculoskeletal:     Right lower leg: No edema.     Left lower leg: No edema.  Skin:    General: Skin is warm.  Neurological:     General: No focal deficit present.     Mental Status: He is alert and oriented to person, place, and time.     Cranial Nerves: Cranial nerves 2-12 are intact.  Psychiatric:        Attention and Perception: Attention normal.        Mood and Affect: Mood normal.        Speech: Speech normal.        Behavior: Behavior normal. Behavior is cooperative.      Labs on Admission: I have personally reviewed following labs and imaging studies  CBC: Recent Labs  Lab 04/08/23 1419 04/08/23 2043  WBC 5.7 5.3  HGB 8.1* 7.6*  HCT 26.1* 24.1*  MCV 100.8* 99.6  PLT 243 244   Basic Metabolic Panel: Recent Labs  Lab 04/08/23 1419  NA 137  K 3.8  CL 109  CO2 23  GLUCOSE 212*  BUN 26*  CREATININE 1.41*  CALCIUM 8.2*   GFR: CrCl cannot be calculated (Unknown ideal weight.). Liver Function Tests: Recent Labs  Lab 04/08/23 1817  AST 24  ALT 16  ALKPHOS 47  BILITOT 0.2*  PROT 6.4*  ALBUMIN 3.5   No results for input(s): "LIPASE", "AMYLASE" in the last 168 hours. No results for input(s): "AMMONIA" in the last 168 hours. Coagulation Profile: Recent Labs  Lab 04/08/23 2043  INR 1.5*   Cardiac  Enzymes: No results for input(s): "CKTOTAL", "CKMB", "CKMBINDEX", "TROPONINI" in the last 168 hours. BNP (last 3 results) No results for input(s): "PROBNP" in the last 8760 hours. HbA1C: No results for input(s): "HGBA1C" in the last 72 hours. CBG: No results for input(s): "GLUCAP" in the last 168 hours. Lipid Profile: No results for input(s): "CHOL", "HDL", "LDLCALC", "TRIG", "CHOLHDL", "LDLDIRECT" in the last 72 hours. Thyroid Function Tests: No results for input(s): "TSH", "T4TOTAL", "FREET4", "T3FREE", "THYROIDAB" in the last 72 hours. Anemia Panel: No results for input(s): "VITAMINB12", "FOLATE", "FERRITIN", "TIBC", "IRON", "RETICCTPCT" in the last 72 hours. Urine analysis: Urinalysis    Component Value Date/Time   COLORURINE YELLOW (A) 01/24/2023 1953   APPEARANCEUR HAZY (A) 01/24/2023 1953   APPEARANCEUR Cloudy (A) 02/28/2021 1454  LABSPEC 1.012 01/24/2023 1953   LABSPEC 1.018 10/21/2012 1747   PHURINE 6.0 01/24/2023 1953   GLUCOSEU NEGATIVE 01/24/2023 1953   GLUCOSEU Negative 10/21/2012 1747   HGBUR MODERATE (A) 01/24/2023 1953   BILIRUBINUR NEGATIVE 01/24/2023 1953   BILIRUBINUR Negative 02/28/2021 1454   BILIRUBINUR Negative 10/21/2012 1747   KETONESUR NEGATIVE 01/24/2023 1953   PROTEINUR NEGATIVE 01/24/2023 1953   NITRITE NEGATIVE 01/24/2023 1953   LEUKOCYTESUR NEGATIVE 01/24/2023 1953   LEUKOCYTESUR Negative 10/21/2012 1747    Unresulted Labs (From admission, onward)    None      Radiological Exams on Admission: CT Head Wo Contrast  Result Date: 04/08/2023 CLINICAL DATA:  Altered mental status, nontraumatic (Ped 0-17y) EXAM: CT HEAD WITHOUT CONTRAST TECHNIQUE: Contiguous axial images were obtained from the base of the skull through the vertex without intravenous contrast. RADIATION DOSE REDUCTION: This exam was performed according to the departmental dose-optimization program which includes automated exposure control, adjustment of the mA and/or kV according  to patient size and/or use of iterative reconstruction technique. COMPARISON:  CT head 01/24/2023 FINDINGS: Brain: Cerebral ventricle sizes are concordant with the degree of cerebral volume loss. Patchy and confluent areas of decreased attenuation are noted throughout the deep and periventricular white matter of the cerebral hemispheres bilaterally, compatible with chronic microvascular ischemic disease. No evidence of large-territorial acute infarction. No parenchymal hemorrhage. No mass lesion. No extra-axial collection. No mass effect or midline shift. No hydrocephalus. Basilar cisterns are patent. Vascular: No hyperdense vessel. Atherosclerotic calcifications are present within the cavernous internal carotid and vertebral arteries. Skull: No acute fracture or focal lesion. Sinuses/Orbits: Complete opacification of bilateral frontal sinuses. Sinus surgery noted. Mastoid air cells are clear. The orbits are unremarkable. Other: None. IMPRESSION: No acute intracranial abnormality. Electronically Signed   By: Tish Frederickson M.D.   On: 04/08/2023 20:57   DG Chest 2 View  Result Date: 04/08/2023 CLINICAL DATA:  Near syncope EXAM: CHEST - 2 VIEW COMPARISON:  01/24/2023 FINDINGS: The heart size and mediastinal contours are within normal limits. Both lungs are clear. The visualized skeletal structures are unremarkable. IMPRESSION: No active cardiopulmonary disease. Electronically Signed   By: Ernie Avena M.D.   On: 04/08/2023 15:16     Data Reviewed: Relevant notes from primary care and specialist visits, past discharge summaries as available in EHR, including Care Everywhere. Prior diagnostic testing as pertinent to current admission diagnoses Updated medications and problem lists for reconciliation ED course, including vitals, labs, imaging, treatment and response to treatment Triage notes, nursing and pharmacy notes and ED provider's notes Notable results as noted in HPI Assessment and Plan: *  Postural dizziness with near syncope Due to hypotension.  Not sure if this is due to dysrhythmias or bleeding and hypovolemia.  Meds held include amlodipine and imdur metoprolol.  Bilateral pulmonary embolism (HCC) Pt is on eliquis and took his am dose.  D/W daughter at bedside that id not feel comfortable giving him eliquis as he is having melanotic stools.   Daughter agrees with holding eliquis.   Chronic kidney disease, stage 3a (HCC) Lab Results  Component Value Date   CREATININE 1.41 (H) 04/08/2023   CREATININE 1.48 (H) 01/29/2023   CREATININE 1.35 (H) 01/27/2023  Stable we will follow. Avoid contrast and renally dose meds.  Iron deficiency anemia due to chronic blood loss Continue iron/ b12 . Pt's hemoglobin has dropped to 7.6 today from 8.1.    Latest Ref Rng & Units 04/08/2023    8:43 PM 04/08/2023  2:19 PM 01/27/2023    4:56 AM  CBC  WBC 4.0 - 10.5 K/uL 5.3  5.7  5.9   Hemoglobin 13.0 - 17.0 g/dL 7.6  8.1  41.3   Hematocrit 39.0 - 52.0 % 24.1  26.1  33.0   Platelets 150 - 400 K/uL 244  243  208   We will give pt one unit of blood.        Dementia (HCC) Pt has known h/o dementia and on my exam he is speaking but words that are not connected as in wernicke's aphasia. Daughter states this is new but nurse laura states he has been like this and is same.  So stat head ct ordered which was negative.  Head ct negative.  Cont namenda. Cont paxil.      Melena Pt had melanotic stool this am that was guaiac positive.  Type/ screen. IV PPI. GI consult placed.    Hypothyroidism Continue levothyroxine at 88 mcg.    Essential hypertension Vitals:   04/08/23 1404 04/08/23 1800 04/08/23 1830 04/08/23 2245  BP: (!) 126/45 (!) 187/53 (!) 181/62 (!) 173/60   04/08/23 2300 04/08/23 2330 04/09/23 0000  BP: (!) 174/58 (!) 169/59 (!) 164/52  Pt was 78/36 on admission. We will start Prn hydralazine q 6h. Stat head ct negative for any acute findings.   We will monitor  patient.  Fall precaution. Aspiration precaution.      DVT prophylaxis:  SCD's  Consults:  GI.  Advance Care Planning:    Code Status: Full Code   Family Communication:  None  Disposition Plan:  Back to previous home environment  Severity of Illness: The appropriate patient status for this patient is INPATIENT. Inpatient status is judged to be reasonable and necessary in order to provide the required intensity of service to ensure the patient's safety. The patient's presenting symptoms, physical exam findings, and initial radiographic and laboratory data in the context of their chronic comorbidities is felt to place them at high risk for further clinical deterioration. Furthermore, it is not anticipated that the patient will be medically stable for discharge from the hospital within 2 midnights of admission.   * I certify that at the point of admission it is my clinical judgment that the patient will require inpatient hospital care spanning beyond 2 midnights from the point of admission due to high intensity of service, high risk for further deterioration and high frequency of surveillance required.*  Author: Gertha Calkin, MD 04/09/2023 1:47 AM  For on call review [email or web address].

## 2023-04-08 NOTE — ED Provider Notes (Signed)
Roger Cook Provider Note    Event Date/Time   First MD Initiated Contact with Patient 04/08/23 1734     (approximate)   History   Near Syncope (Patient with dementia arrived by EMS from home; Wife states that she noticed patient holding onto the counter and "pale as a ghost" and he was "very close to losing consciousness"; Upon arrival here, patient has no complaints but does not recall the events (this is baseline for him))   HPI  Roger Cook is a 89 y.o. male hypertension, pulmonary embolism on Eliquis with last dose this morning, recurrent syncope of unknown etiology, anemia presenting to the emergency department for evaluation following a near syncopal episode.  Earlier today, patient's wife noticed that he became very pale and was holding onto the counter.  He became very faint and his wife sat him down on a chair, unsure if he passed out or just came close.  He otherwise reports he has been feeling fine recently.  Denies chest pain, shortness of breath, abdominal pain, vomiting, diarrhea.  His caregiver did report that he had an episode of dark stool this morning.  I did review multiple records with the patient.  I reviewed his admission in May related to syncope.  I did also review his discharge summary from mid March when he was admitted with a pulmonary embolism, as well as from February of this year at which time he presented with a fall and was found to have a hemoglobin of 6 without identified bleeding source on EGD, colonoscopy deferred.  Has previously had a colonoscopy demonstrating AVM.     Physical Exam   Triage Vital Signs: ED Triage Vitals  Encounter Vitals Group     BP 04/08/23 1404 (!) 126/45     Systolic BP Percentile --      Diastolic BP Percentile --      Pulse Rate 04/08/23 1404 70     Resp 04/08/23 1404 16     Temp 04/08/23 1404 98.4 F (36.9 C)     Temp Source 04/08/23 1404 Oral     SpO2 04/08/23 1404 97 %     Weight --       Height --      Head Circumference --      Peak Flow --      Pain Score 04/08/23 1408 0     Pain Loc --      Pain Education --      Exclude from Growth Chart --     Most recent vital signs: Vitals:   04/08/23 1800 04/08/23 1830  BP: (!) 187/53 (!) 181/62  Pulse: 72 66  Resp:  16  Temp:  98.6 F (37 C)  SpO2: 100% 100%     General: Awake, interactive  CV:  Regular rate, good peripheral perfusion.  Resp:  Lungs clear, unlabored respirations.  Abd:  Soft, nondistended, nontender to palpation.  Dark black stool on rectal exam, Hemoccult positive. Neuro:  Symmetric facial movement, fluid speech   ED Results / Procedures / Treatments   Labs (all labs ordered are listed, but only abnormal results are displayed) Labs Reviewed  BASIC METABOLIC PANEL - Abnormal; Notable for the following components:      Result Value   Glucose, Bld 212 (*)    BUN 26 (*)    Creatinine, Ser 1.41 (*)    Calcium 8.2 (*)    GFR, Estimated 46 (*)    All other components  within normal limits  CBC - Abnormal; Notable for the following components:   RBC 2.59 (*)    Hemoglobin 8.1 (*)    HCT 26.1 (*)    MCV 100.8 (*)    All other components within normal limits  HEPATIC FUNCTION PANEL - Abnormal; Notable for the following components:   Total Protein 6.4 (*)    Total Bilirubin 0.2 (*)    All other components within normal limits  PROTIME-INR - Abnormal; Notable for the following components:   Prothrombin Time 18.0 (*)    INR 1.5 (*)    All other components within normal limits  CBC - Abnormal; Notable for the following components:   RBC 2.42 (*)    Hemoglobin 7.6 (*)    HCT 24.1 (*)    All other components within normal limits  APTT  TYPE AND SCREEN  TROPONIN I (HIGH SENSITIVITY)     EKG EKG independently reviewed interpreted by myself (ER attending) demonstrates:    RADIOLOGY Imaging independently reviewed and interpreted by myself demonstrates:    PROCEDURES:  Critical Care  performed: Yes, see critical care procedure note(s)  CRITICAL CARE Performed by: Trinna Post   Total critical care time: 32 minutes  Critical care time was exclusive of separately billable procedures and treating other patients.  Critical care was necessary to treat or prevent imminent or life-threatening deterioration.  Critical care was time spent personally by me on the following activities: development of treatment plan with patient and/or surrogate as well as nursing, discussions with consultants, evaluation of patient's response to treatment, examination of patient, obtaining history from patient or surrogate, ordering and performing treatments and interventions, ordering and review of laboratory studies, ordering and review of radiographic studies, pulse oximetry and re-evaluation of patient's condition.   Procedures   MEDICATIONS ORDERED IN ED: Medications  pantoprozole (PROTONIX) 80 mg /NS 100 mL infusion (8 mg/hr Intravenous New Bag/Given 04/08/23 1831)  lactated ringers infusion (has no administration in time range)  acetaminophen (TYLENOL) tablet 650 mg (has no administration in time range)    Or  acetaminophen (TYLENOL) suppository 650 mg (has no administration in time range)  morphine (PF) 2 MG/ML injection 2 mg (has no administration in time range)  ondansetron (ZOFRAN) tablet 4 mg (has no administration in time range)    Or  ondansetron (ZOFRAN) injection 4 mg (has no administration in time range)  pantoprazole (PROTONIX) 80 mg /NS 100 mL IVPB (0 mg Intravenous Stopped 04/08/23 1946)     IMPRESSION / MDM / ASSESSMENT AND PLAN / ED COURSE  I reviewed the triage vital signs and the nursing notes.  Differential diagnosis includes, but is not limited to, anemia secondary to GI bleed, chronic disease, electrolyte abnormality, arrhythmia, bradycardia  Patient's presentation is most consistent with acute presentation with potential threat to life or bodily  function.  89 year old male presenting following a near syncopal episode.  His lab work demonstrates  worsening anemia with a  hemoglobin of 8.1, baseline appears to be closer to 9-10.  His BMP demonstrates hyperglycemia without evidence of DKA, stable creatinine elevation.  I have added on a liver function panel.  With his new black stools that is Hemoccult positive, I am concerned about an acute GI bleed in the setting of his Eliquis use.  He is hemodynamically stable here, do not think he needs reversal.  I have ordered a Protonix drip and a type and screen.  However, I do think he is appropriate for admission for further  evaluation.  Will reach out to hospitalist team.  Case discussed with Dr. Allena Katz.  She will evaluate the patient for anticipated admission.      FINAL CLINICAL IMPRESSION(S) / ED DIAGNOSES   Final diagnoses:  Near syncope  Gastrointestinal hemorrhage, unspecified gastrointestinal hemorrhage type     Rx / DC Orders   ED Discharge Orders     None        Note:  This document was prepared using Dragon voice recognition software and may include unintentional dictation errors.   Trinna Post, MD 04/08/23 [phone]

## 2023-04-09 ENCOUNTER — Encounter: Payer: Self-pay | Admitting: Internal Medicine

## 2023-04-09 DIAGNOSIS — R55 Syncope and collapse: Secondary | ICD-10-CM | POA: Diagnosis not present

## 2023-04-09 DIAGNOSIS — R42 Dizziness and giddiness: Secondary | ICD-10-CM | POA: Diagnosis not present

## 2023-04-09 LAB — CBC
HCT: 30.8 % — ABNORMAL LOW (ref 39.0–52.0)
Hemoglobin: 9.9 g/dL — ABNORMAL LOW (ref 13.0–17.0)
MCH: 31.1 pg (ref 26.0–34.0)
MCHC: 32.1 g/dL (ref 30.0–36.0)
MCV: 96.9 fL (ref 80.0–100.0)
Platelets: 206 10*3/uL (ref 150–400)
RBC: 3.18 MIL/uL — ABNORMAL LOW (ref 4.22–5.81)
RDW: 14.7 % (ref 11.5–15.5)
WBC: 6.3 10*3/uL (ref 4.0–10.5)
nRBC: 0 % (ref 0.0–0.2)

## 2023-04-09 LAB — PREPARE RBC (CROSSMATCH)

## 2023-04-09 MED ORDER — LEVOTHYROXINE SODIUM 88 MCG PO TABS
88.0000 ug | ORAL_TABLET | Freq: Every day | ORAL | Status: DC
  Administered 2023-04-10: 88 ug via ORAL
  Filled 2023-04-09: qty 1

## 2023-04-09 MED ORDER — METOPROLOL SUCCINATE ER 25 MG PO TB24
12.5000 mg | ORAL_TABLET | Freq: Every day | ORAL | Status: DC
  Administered 2023-04-09 – 2023-04-10 (×2): 12.5 mg via ORAL
  Filled 2023-04-09 (×2): qty 1

## 2023-04-09 MED ORDER — PANTOPRAZOLE SODIUM 40 MG IV SOLR
40.0000 mg | Freq: Two times a day (BID) | INTRAVENOUS | Status: DC
  Administered 2023-04-09: 40 mg via INTRAVENOUS
  Filled 2023-04-09 (×2): qty 10

## 2023-04-09 MED ORDER — AMLODIPINE BESYLATE 5 MG PO TABS
5.0000 mg | ORAL_TABLET | Freq: Every evening | ORAL | Status: DC
  Administered 2023-04-09: 5 mg via ORAL
  Filled 2023-04-09: qty 1

## 2023-04-09 MED ORDER — LABETALOL HCL 5 MG/ML IV SOLN
20.0000 mg | INTRAVENOUS | Status: DC | PRN
  Administered 2023-04-09: 20 mg via INTRAVENOUS
  Filled 2023-04-09: qty 4

## 2023-04-09 MED ORDER — MEMANTINE HCL 5 MG PO TABS
10.0000 mg | ORAL_TABLET | Freq: Two times a day (BID) | ORAL | Status: DC
  Administered 2023-04-09 – 2023-04-10 (×3): 10 mg via ORAL
  Filled 2023-04-09 (×3): qty 2

## 2023-04-09 MED ORDER — HYDRALAZINE HCL 20 MG/ML IJ SOLN
5.0000 mg | INTRAMUSCULAR | Status: DC | PRN
  Administered 2023-04-09 (×2): 5 mg via INTRAVENOUS
  Filled 2023-04-09 (×2): qty 1

## 2023-04-09 MED ORDER — ISOSORBIDE MONONITRATE ER 30 MG PO TB24
60.0000 mg | ORAL_TABLET | Freq: Every day | ORAL | Status: DC
  Administered 2023-04-09 – 2023-04-10 (×2): 60 mg via ORAL
  Filled 2023-04-09 (×2): qty 2

## 2023-04-09 MED ORDER — SODIUM CHLORIDE 0.9 % IV SOLN: Freq: Once | INTRAVENOUS | Status: AC

## 2023-04-09 MED ORDER — PAROXETINE HCL 10 MG PO TABS
10.0000 mg | ORAL_TABLET | Freq: Every day | ORAL | Status: DC
  Administered 2023-04-09 – 2023-04-10 (×2): 10 mg via ORAL
  Filled 2023-04-09 (×2): qty 1

## 2023-04-09 NOTE — TOC Initial Note (Signed)
Transition of Care [facility]) - Initial/Assessment Note    Patient Details  Name: Roger Cook MRN: [id number] Date of Birth: March 31, 1927  [facility] of [facility] CM/SW Contact:    Kreg Shropshire, RN Phone Number: 04/09/2023, 10:35 AM  Clinical Narrative:                 CM assessed for TOC needs. Readmission prevention screen completed. Pt lives at home with wife. Wife and daughter both provide support. He has RW, BSC, and shower chair at home. Family will provide transportation whenever d/c. He has no preference of HH and SNF if needed. PCP is Dr. Sherrie Mustache. No complaints with receiving medications at this time. Cm will continue to follow for additional needs.        Patient Goals and CMS Choice            Expected Discharge Plan and Services                                              Prior Living Arrangements/Services                       Activities of Daily Living      Permission Sought/Granted                  Emotional Assessment              Admission diagnosis:  Postural dizziness with near syncope [R42, R55] Patient Active Problem List   Diagnosis Date Noted   Postural dizziness with near syncope 04/08/2023   Hospital discharge follow-up 01/29/2023   History of bradycardia 01/29/2023   History of syncope 01/29/2023   Transient hypotension 01/29/2023   AMS (altered mental status) 01/24/2023   Sinus bradycardia 01/24/2023   Bilateral pulmonary embolism (HCC) 12/06/2022   Chronic kidney disease, stage 3a (HCC) 12/06/2022   Vitamin B12 deficiency 11/17/2022   Generalized weakness 11/17/2022   Positive blood culture 11/15/2022   Chronic anemia 11/13/2022   Hyperglycemia 07/09/2022   Iron deficiency anemia due to chronic blood loss    Angiodysplasia of intestinal tract    Severe anemia 12/24/2021   Angina pectoris (HCC) 09/18/2020   Dementia (HCC) 09/18/2020   Renal hematoma, right, subsequent encounter 04/25/2018    Localized osteoporosis of spine 12/23/2017   Constipation 11/06/2016   Bell's palsy 02/26/2015   Nephrogenic adenoma of bladder 02/26/2015   Folliculitis 02/26/2015   Personal history of methicillin resistant Staphylococcus aureus 02/26/2015   Fatty tumor 02/26/2015   Syncope 02/26/2015   Fungal infection of nail 02/26/2015   H/O malignant neoplasm of prostate 02/26/2015   Melena 10/18/2012   Arteriosclerosis of coronary artery 01/05/2009   Severe obstructive sleep apnea 01/05/2009   Hypothyroidism 01/05/2009   Essential hypertension 09/22/1998   PCP:  [name], MD Pharmacy:   [facility] - [place], [place] - [address] [place] [address] Phone: [phone] Fax: [phone]     Social Determinants of Health (SDOH) Social History: SDOH Screenings   Food Insecurity: No Food Insecurity (01/28/2023)  Housing: Low Risk  (01/28/2023)  Transportation Needs: No Transportation Needs (01/28/2023)  Utilities: Not At Risk (01/25/2023)  Alcohol Screen: Low Risk  (01/29/2023)  Depression (PHQ2-9): Low Risk  (01/29/2023)  Financial Resource Strain: Low Risk  (01/28/2023)  Physical  Activity: Sufficiently Active (02/07/2020)  Social Connections: Unknown (04/08/2022)   Received from [facility]  Stress: No Stress Concern Present (02/07/2020)  Tobacco Use: Medium Risk (02/19/2023)   Received from [facility]   SDOH Interventions:     Readmission Risk Interventions    04/09/2023   10:35 AM 01/26/2023    3:59 PM  Readmission Risk Prevention Plan  Transportation Screening Complete Complete  Medication Review (RN Care Manager) Complete Complete  PCP or Specialist appointment within 3-5 days of discharge -- Complete  HRI or Home Care Consult Complete Complete  SW Recovery Care/Counseling Consult Complete Complete  Palliative Care Screening Not Applicable Not Applicable  Skilled Nursing Facility Not Applicable  Not Applicable

## 2023-04-09 NOTE — Assessment & Plan Note (Signed)
Due to hypotension.  Not sure if this is due to dysrhythmias or bleeding and hypovolemia.  Meds held include amlodipine and imdur metoprolol.

## 2023-04-09 NOTE — ED Notes (Signed)
BP 171/75 repeated x2. Given PRN hydralazine per MD order, see MAR. Pt remains on VS monitoring.

## 2023-04-09 NOTE — Assessment & Plan Note (Signed)
Continue iron/ b12 . Pt's hemoglobin has dropped to 7.6 today from 8.1.    Latest Ref Rng & Units 04/08/2023    8:43 PM 04/08/2023    2:19 PM 01/27/2023    4:56 AM  CBC  WBC 4.0 - 10.5 K/uL 5.3  5.7  5.9   Hemoglobin 13.0 - 17.0 g/dL 7.6  8.1  40.9   Hematocrit 39.0 - 52.0 % 24.1  26.1  33.0   Platelets 150 - 400 K/uL 244  243  208   We will give pt one unit of blood.

## 2023-04-09 NOTE — Progress Notes (Signed)
  PROGRESS NOTE    Roger CAFARELLI  NGE:[id number] DOB: [birth year] DOA: 04/08/2023 PCP: [name], MD  106A/106A-AA  LOS: 1 day   Brief hospital course:   Assessment & Plan: Roger Cook is a 89 y.o. male with medical history significant for dementia, hypertension, hyperlipidemia, hypothyroid, history of TIA, history of prostate cancer, acute blood loss anemia and GI bleed attributed to known history of AVMs on colonoscopy that was done in 2023 and EGD done in feb 2024 was normal - Dr.Locklear , presents today for hypotension and recurrent syncope.   Patient reported dark stools morning prior to presentation.  Pt was hypotensive upon standing with blood pressures of 78/36 in the emergency room.     * Postural dizziness with near syncope Due to hypotension.  Not sure if this is due to dysrhythmias or bleeding and hypovolemia.  --home amlodipine and imdur metoprolol held on admission.  BP trended up to 180's the next day.  Hx of Bilateral pulmonary embolism (HCC) --hold Eliquis, as per agreement with family and pt  Chronic kidney disease, stage 3a (HCC)  Iron deficiency anemia due to chronic blood loss with presumed GI source Pt's Hgb dropped to 7.6, was 10.5 back in May 2024. --s/p 1u pRBC --GI consulted, decision made not to undergo procedure --hold home Eliquis --cont PPI as IV protonix BID  Dementia (HCC) --pt does not seem to be aware of his dementia, and said he is his own decision maker. Cont namenda. Cont paxil.   Hypothyroidism Continue levothyroxine at 88 mcg.   Essential hypertension --resume home amlodipine, Imdur, Toprol   DVT prophylaxis: SCD/Compression stockings Code Status: DNR  Family Communication:  Level of care: Med-Surg Dispo:   The patient is from: home Anticipated d/c is to: home Anticipated d/c date is: tomorrow   Subjective and Interval History:  Pt reported feeling better.   Objective: Vitals:   04/09/23 1139 04/09/23 1140  04/09/23 1400 04/09/23 1612  BP:  (!) 180/54 (!) 130/96 (!) 123/42  Pulse: 66 60 79 87  Resp:  16  16  Temp:  97.8 F (36.6 C)  97.9 F (36.6 C)  TempSrc:  Oral    SpO2: 98% 96%  97%    Intake/Output Summary (Last 24 hours) at 04/09/2023 1904 Last data filed at 04/09/2023 0334 Gross per 24 hour  Intake 100 ml  Output 120 ml  Net -20 ml   There were no vitals filed for this visit.  Examination:   Constitutional: NAD, alert, oriented to person and place HEENT: conjunctivae and lids normal, EOMI CV: No cyanosis.   RESP: normal respiratory effort, on RA Neuro: II - XII grossly intact.   Psych: Normal mood and affect.     Data Reviewed: I have personally reviewed labs and imaging studies  Time spent: 50 minutes  Darlin Priestly, MD [facility] If 7PM-7AM, please contact night-coverage 04/09/2023, 7:04 PM

## 2023-04-09 NOTE — ED Notes (Signed)
Report received by previous RN. Pt resting comfortably. RR unlabored. Remains on VS monitor. Blood transfusion infusing. Call bell within reach.

## 2023-04-09 NOTE — Plan of Care (Signed)

## 2023-04-09 NOTE — Assessment & Plan Note (Addendum)
Pt had melanotic stool this am that was guaiac positive.  Type/ screen. IV PPI. GI consult placed.

## 2023-04-09 NOTE — Assessment & Plan Note (Signed)
Pt is on eliquis and took his am dose.  D/W daughter at bedside that id not feel comfortable giving him eliquis as he is having melanotic stools.   Daughter agrees with holding eliquis.

## 2023-04-09 NOTE — Assessment & Plan Note (Signed)
Lab Results  Component Value Date   CREATININE 1.41 (H) 04/08/2023   CREATININE 1.48 (H) 01/29/2023   CREATININE 1.35 (H) 01/27/2023  Stable we will follow. Avoid contrast and renally dose meds.

## 2023-04-09 NOTE — Assessment & Plan Note (Signed)
Continue levothyroxine at 88 mcg.  ? ?

## 2023-04-09 NOTE — Assessment & Plan Note (Signed)
Pt has known h/o dementia and on my exam he is speaking but words that are not connected as in wernicke's aphasia. Daughter states this is new but nurse laura states he has been like this and is same.  So stat head ct ordered which was negative.  Head ct negative.  Cont namenda. Cont paxil.

## 2023-04-09 NOTE — Assessment & Plan Note (Addendum)
Vitals:   04/08/23 1404 04/08/23 1800 04/08/23 1830 04/08/23 2245  BP: (!) 126/45 (!) 187/53 (!) 181/62 (!) 173/60   04/08/23 2300 04/08/23 2330 04/09/23 0000  BP: (!) 174/58 (!) 169/59 (!) 164/52  Pt was 78/36 on admission. We will start Prn hydralazine q 6h. Stat head ct negative for any acute findings.   We will monitor patient.  Fall precaution. Aspiration precaution.

## 2023-04-09 NOTE — Consult Note (Addendum)
GI Inpatient Consult Note  Reason for Consult: GI Bleed   Attending Requesting Consult: Dr. Allena Katz  History of Present Illness: Roger Cook is a 89 y.o. male seen for evaluation of anemia/GI bleed at the request of Dr. Allena Katz. PMHx of dementia, hypertension, hyperlipidemia, hypothyroid, history of TIA, history of prostate cancer, acute blood loss anemia and GI bleed attributed to known history of AVMs on colonoscopy that was done in 2023. EGD done in Feb 2024 for IDA was normal. PMhx of bilateral PE 01/2023 on home Eliquis. He had admission 10/2022 for GI bleed w/ Hgb 6, had EGD that was negative.  He presented yesterday to ED for near syncope at home, on admission he was hypotensive with standing w/ initial standing BP 78/36 in ER. He received protonix 80mg  IV and PPI gtt. BP meds held. Overnight developed HTN and BP increased to 171/75, received PRN hydralazine.  On admission Hgb was noted to be 8.1 (decreased from 10.5 after last admission in May 2024). Dropped to 7.6. He received 1 unit PBRCs overnight. Head CT negative.  Patient examined at bedside. He is not able to provide any history. He is orientated to person and place but not year or any details regarding recent symptoms.   I discussed with his wife via phone, he felt well yesterday morning. Around 11 am he was standing in kitchen and became light headed. Wife reports one of their aids had reported dark stools earlier this week but no other recent GI hx. He had been feeling well prior to yesterday. Denies any nsaid use.    Last Colonoscopy: 12/2021--non bleeding colonic angiodysplastic lesions treated w/ APC. Diverticulosis in sigmoid.  Last Endoscopy: 10/2022-EGD for IDA - normal.    Past Medical History:  Past Medical History:  Diagnosis Date   Anemia    Aortic atherosclerosis (HCC)    Bell palsy    Bilateral renal cysts    Bladder tumor    CAD (coronary artery disease)    DOE (dyspnea on exertion)    Elevated lactic  acid level 11/13/2022   GERD (gastroesophageal reflux disease)    History of angina    History of penile implant    History of prostate cancer    Hyperlipidemia    Hypertension    Hypothyroidism    Lipoma    MRSA bacteremia 02/2011   Nephrolithiasis    OSA on CPAP    Pneumonia    as a teenager    Problem List: Patient Active Problem List   Diagnosis Date Noted   Postural dizziness with near syncope 04/08/2023   Hospital discharge follow-up 01/29/2023   History of bradycardia 01/29/2023   History of syncope 01/29/2023   Transient hypotension 01/29/2023   AMS (altered mental status) 01/24/2023   Sinus bradycardia 01/24/2023   Bilateral pulmonary embolism (HCC) 12/06/2022   Chronic kidney disease, stage 3a (HCC) 12/06/2022   Vitamin B12 deficiency 11/17/2022   Generalized weakness 11/17/2022   Positive blood culture 11/15/2022   Chronic anemia 11/13/2022   Hyperglycemia 07/09/2022   Iron deficiency anemia due to chronic blood loss    Angiodysplasia of intestinal tract    Severe anemia 12/24/2021   Angina pectoris (HCC) 09/18/2020   Dementia (HCC) 09/18/2020   Renal hematoma, right, subsequent encounter 04/25/2018   Localized osteoporosis of spine 12/23/2017   Constipation 11/06/2016   Bell's palsy 02/26/2015   Nephrogenic adenoma of bladder 02/26/2015   Folliculitis 02/26/2015   Personal history of methicillin resistant Staphylococcus aureus  02/26/2015   Fatty tumor 02/26/2015   Syncope 02/26/2015   Fungal infection of nail 02/26/2015   H/O malignant neoplasm of prostate 02/26/2015   Melena 10/18/2012   Arteriosclerosis of coronary artery 01/05/2009   Severe obstructive sleep apnea 01/05/2009   Hypothyroidism 01/05/2009   Essential hypertension 09/22/1998    Past Surgical History: Past Surgical History:  Procedure Laterality Date   APPENDECTOMY  2005   CARDIAC CATHETERIZATION N/A 08/13/1990   75% pLCx, 75% mLCx, 25% mLAD, 75% D2, 50% right renal artery;  Location: [facility]; Surgeon: Eugenia Pancoast, MD   CATARACT EXTRACTION  2005   also had a macular hole in 2005   CATARACT EXTRACTION  2008   COLONOSCOPY     2003, 2014   COLONOSCOPY WITH PROPOFOL N/A 12/26/2021   Procedure: COLONOSCOPY WITH PROPOFOL;  Surgeon: Midge Minium, MD;  Location: [facility] ENDOSCOPY;  Service: Endoscopy;  Laterality: N/A;   ESOPHAGOGASTRODUODENOSCOPY     2004, 2014   ESOPHAGOGASTRODUODENOSCOPY (EGD) WITH PROPOFOL N/A 12/26/2021   Procedure: ESOPHAGOGASTRODUODENOSCOPY (EGD) WITH PROPOFOL;  Surgeon: Midge Minium, MD;  Location: [facility] ENDOSCOPY;  Service: Endoscopy;  Laterality: N/A;   ESOPHAGOGASTRODUODENOSCOPY (EGD) WITH PROPOFOL N/A 11/15/2022   Procedure: ESOPHAGOGASTRODUODENOSCOPY (EGD) WITH PROPOFOL;  Surgeon: Regis Bill, MD;  Location: [facility] ENDOSCOPY;  Service: Endoscopy;  Laterality: N/A;   HAND SURGERY Left 06/26/2011   Malignancy removed from left hand   NASAL SEPTUM SURGERY  1986   NASAL SINUS SURGERY  1990   penile inplant  1984   polyp of rectum  2003   PROSTATE SURGERY  1975   Abdominal, had to have radiation treatment with the procedure   PROSTATE SURGERY     TONSILLECTOMY     TRANSURETHRAL RESECTION OF BLADDER TUMOR N/A 03/12/2021   Procedure: TRANSURETHRAL RESECTION OF BLADDER TUMOR (TURBT);  Surgeon: Riki Altes, MD;  Location: [facility] ORS;  Service: Urology;  Laterality: N/A;    Allergies: Allergies  Allergen Reactions   Levofloxacin Swelling    lips swelling   Povidone Iodine Other (See Comments)    Unknown, can not remember   Sulfa Antibiotics     Unknown, can not remember   Benadryl [Diphenhydramine] Rash    Home Medications: (Not in a hospital admission)  Home medication reconciliation was completed with the patient.   Scheduled Inpatient Medications:    Continuous Inpatient Infusions:    lactated ringers 50 mL/hr at 04/09/23 0336   pantoprazole 8 mg/hr (04/09/23 0510)    PRN Inpatient Medications:  acetaminophen **OR**  acetaminophen, hydrALAZINE, labetalol, morphine injection, ondansetron **OR** ondansetron (ZOFRAN) IV  Family History: family history includes Heart disease in his father; Lung cancer in his mother.    Social History:   reports that he quit smoking about 60 years ago. His smoking use included cigarettes. He started smoking about 76 years ago. He has a 16 pack-year smoking history. He has never used smokeless tobacco. He reports that he does not currently use alcohol after a past usage of about 2.0 standard drinks of alcohol per week. He reports that he does not use drugs.   Review of Systems: Limited by dementia, most information garnered via chart review Constitutional: Weight is stable.  Eyes: No changes in vision. ENT: No oral lesions, sore throat.  GI: see HPI.  Heme/Lymph: No easy bruising.  CV: No chest pain.  GU: No hematuria.  Integumentary: No rashes.  Neuro: No headaches.  Psych: No depression/anxiety.  Endocrine: No heat/cold intolerance.  Allergic/Immunologic: No urticaria.  Resp:  No cough, SOB.  Musculoskeletal: No joint swelling.    Physical Examination: BP (!) 160/68   Pulse 65   Temp 97.8 F (36.6 C) (Oral)   Resp 13   SpO2 98%  Gen: NAD, alert, pleasant, orientated to person and place but not year  HEENT: PEERL, EOMI, Neck: supple, no JVD or thyromegaly Chest: CTA bilaterally, no wheezes, crackles, or other adventitious sounds CV: RRR, no m/g/c/r Abd: soft, NT, ND, +BS in all four quadrants; no HSM, guarding, ridigity, or rebound tenderness Ext: no edema, well perfused with 2+ pulses, Skin: no rash or lesions noted  Data: Lab Results  Component Value Date   WBC 6.3 04/09/2023   HGB 9.9 (L) 04/09/2023   HCT 30.8 (L) 04/09/2023   MCV 96.9 04/09/2023   PLT 206 04/09/2023   Recent Labs  Lab 04/08/23 1419 04/08/23 2043 04/09/23 0630  HGB 8.1* 7.6* 9.9*   Lab Results  Component Value Date   NA 137 04/08/2023   K 3.8 04/08/2023   CL 109 04/08/2023    CO2 23 04/08/2023   BUN 26 (H) 04/08/2023   CREATININE 1.41 (H) 04/08/2023   GLU 99 08/30/2014   Lab Results  Component Value Date   ALT 16 04/08/2023   AST 24 04/08/2023   ALKPHOS 47 04/08/2023   BILITOT 0.2 (L) 04/08/2023   Recent Labs  Lab 04/08/23 2043  APTT 33  INR 1.5*   CT angio 11/2022-IMPRESSION: Recent pulmonary emboli with significantly improved clot burden since 12/06/2022. Nonocclusive clot is still seen in the lower lobes and lingula.  No new abnormality.   Assessment/Plan: Mr. Slayton is a 89 y.o. male admitted for syncope and noted to have reported melena with drop in Hgb.  GI Bleed-baseline Hgb May 2024 was 10.5 at d/c, was 8.1 on admission yesterday and dropped to 7.6 overnight. Responded well to 1 unit and currently Hgb 9.9. Patient is high risk for sedation given he recently had pulmonary embolism in May 2024.  Eliquis is being held in the setting of GI bleeding.  Also high risk for sedation given advanced age and dementia. I called and discussed with wife. Patient has had recurrent admission for symptomatic anemia, likely due to AVMS. She believes she would like to hold off on further endoscopic procedures and manage symptoms conservatively but she is coming to hospital shortly and will discuss further later day. Could also consider Tagged RBC scan to evaluate for active bleeding.    Recommendations: Further recs pending. Will discuss plan of care with wife when she arrives. Supportive care in interim. Continue holding Eliquis for now. Monitor H&H.  Transfusion and resuscitation as per primary team Avoid frequent lab draws to prevent lab induced anemia Supportive care and antiemetics as per primary team Maintain two sites IV access Avoid nsaids Monitor for GIB.     Case was discussed with Dr. Timothy Lasso. Thank you for the consult. Please call with questions or concerns.  Ardelia Mems, PA-C [facility]

## 2023-04-10 ENCOUNTER — Telehealth: Payer: Self-pay

## 2023-04-10 DIAGNOSIS — R55 Syncope and collapse: Secondary | ICD-10-CM | POA: Diagnosis not present

## 2023-04-10 DIAGNOSIS — R42 Dizziness and giddiness: Secondary | ICD-10-CM | POA: Diagnosis not present

## 2023-04-10 LAB — BPAM RBC
Blood Product Expiration Date: 202407232359
ISSUE DATE / TIME: 202407180153
Unit Type and Rh: 600

## 2023-04-10 LAB — CBC
HCT: 29.8 % — ABNORMAL LOW (ref 39.0–52.0)
Hemoglobin: 10 g/dL — ABNORMAL LOW (ref 13.0–17.0)
MCH: 31.7 pg (ref 26.0–34.0)
MCHC: 33.6 g/dL (ref 30.0–36.0)
MCV: 94.6 fL (ref 80.0–100.0)
Platelets: 229 10*3/uL (ref 150–400)
RBC: 3.15 MIL/uL — ABNORMAL LOW (ref 4.22–5.81)
RDW: 15.5 % (ref 11.5–15.5)
WBC: 6.5 10*3/uL (ref 4.0–10.5)
nRBC: 0 % (ref 0.0–0.2)

## 2023-04-10 LAB — TYPE AND SCREEN
ABO/RH(D): AB POS
Antibody Screen: NEGATIVE
Unit division: 0

## 2023-04-10 LAB — BASIC METABOLIC PANEL
Anion gap: 8 (ref 5–15)
BUN: 23 mg/dL (ref 8–23)
CO2: 22 mmol/L (ref 22–32)
Calcium: 8.2 mg/dL — ABNORMAL LOW (ref 8.9–10.3)
Chloride: 108 mmol/L (ref 98–111)
Creatinine, Ser: 1.48 mg/dL — ABNORMAL HIGH (ref 0.61–1.24)
GFR, Estimated: 44 mL/min — ABNORMAL LOW (ref >60)
Glucose, Bld: 118 mg/dL — ABNORMAL HIGH (ref 70–99)
Potassium: 3.8 mmol/L (ref 3.5–5.1)
Sodium: 138 mmol/L (ref 135–145)

## 2023-04-10 LAB — MAGNESIUM: Magnesium: 2.6 mg/dL — ABNORMAL HIGH (ref 1.7–2.4)

## 2023-04-10 MED ORDER — PANTOPRAZOLE SODIUM 40 MG PO TBEC: 40.0000 mg | DELAYED_RELEASE_TABLET | Freq: Two times a day (BID) | ORAL | 2 refills | Status: DC

## 2023-04-10 MED ORDER — APIXABAN 2.5 MG PO TABS: 2.5000 mg | ORAL_TABLET | Freq: Two times a day (BID) | ORAL | 2 refills | Status: DC

## 2023-04-10 NOTE — Progress Notes (Signed)
Inpatient Follow-up/Progress Note   Patient ID: Roger Cook is a 89 y.o. male.  Overnight Events / Subjective Findings NAEON. Per notation, plan for dispo today. No abdominal pain. Hgb stable post transfusion. Eliquis being held. No other acute gi complaints.  Review of Systems  Unable to perform ROS: Dementia  Gastrointestinal:  Negative for abdominal pain.     Medications  Current Facility-Administered Medications:    acetaminophen (TYLENOL) tablet 650 mg, 650 mg, Oral, Q6H PRN **OR** acetaminophen (TYLENOL) suppository 650 mg, 650 mg, Rectal, Q6H PRN, Gertha Calkin, MD   amLODipine (NORVASC) tablet 5 mg, 5 mg, Oral, QPM, Darlin Priestly, MD, 5 mg at 04/09/23 1726   hydrALAZINE (APRESOLINE) injection 5 mg, 5 mg, Intravenous, Q4H PRN, Irena Cords V, MD, 5 mg at 04/09/23 1154   isosorbide mononitrate (IMDUR) 24 hr tablet 60 mg, 60 mg, Oral, Daily, Darlin Priestly, MD, 60 mg at 04/10/23 0849   labetalol (NORMODYNE) injection 20 mg, 20 mg, Intravenous, Q3H PRN, Mansy, Jan A, MD, 20 mg at 04/09/23 0534   levothyroxine (SYNTHROID) tablet 88 mcg, 88 mcg, Oral, Q0600, Darlin Priestly, MD, 88 mcg at 04/10/23 0544   memantine (NAMENDA) tablet 10 mg, 10 mg, Oral, BID, Darlin Priestly, MD, 10 mg at 04/10/23 0850   metoprolol succinate (TOPROL-XL) 24 hr tablet 12.5 mg, 12.5 mg, Oral, Daily, Darlin Priestly, MD, 12.5 mg at 04/10/23 0850   ondansetron (ZOFRAN) tablet 4 mg, 4 mg, Oral, Q6H PRN **OR** ondansetron (ZOFRAN) injection 4 mg, 4 mg, Intravenous, Q6H PRN, Gertha Calkin, MD   pantoprazole (PROTONIX) injection 40 mg, 40 mg, Intravenous, Q12H, Darlin Priestly, MD, 40 mg at 04/09/23 2203   PARoxetine (PAXIL) tablet 10 mg, 10 mg, Oral, Daily, Darlin Priestly, MD, 10 mg at 04/10/23 0851   acetaminophen **OR** acetaminophen, hydrALAZINE, labetalol, ondansetron **OR** ondansetron (ZOFRAN) IV   Objective    Vitals:   04/09/23 1930 04/09/23 2330 04/10/23 0500 04/10/23 0900  BP: (!) 138/47 (!) 118/48 (!) 169/52 (!) 163/64  Pulse:  72 63 61 (!) 55  Resp:   12 16  Temp: 97.9 F (36.6 C) 98 F (36.7 C) 97.6 F (36.4 C) 98 F (36.7 C)  TempSrc: Oral Oral  Oral  SpO2: 95% 95% 99% 98%     Physical Exam Vitals and nursing note reviewed.  Constitutional:      General: He is not in acute distress.    Appearance: He is not ill-appearing, toxic-appearing or diaphoretic.  HENT:     Head: Normocephalic and atraumatic.     Nose: Nose normal.     Mouth/Throat:     Mouth: Mucous membranes are moist.     Pharynx: Oropharynx is clear.  Eyes:     General: No scleral icterus.    Extraocular Movements: Extraocular movements intact.  Cardiovascular:     Rate and Rhythm: Normal rate and regular rhythm.     Heart sounds: Normal heart sounds. No murmur heard.    No friction rub. No gallop.  Pulmonary:     Effort: Pulmonary effort is normal. No respiratory distress.     Breath sounds: Normal breath sounds. No wheezing, rhonchi or rales.  Abdominal:     General: Bowel sounds are normal. There is no distension.     Palpations: Abdomen is soft.     Tenderness: There is no abdominal tenderness. There is no guarding or rebound.  Musculoskeletal:     Cervical back: Neck supple.     Right lower leg:  No edema.     Left lower leg: No edema.  Skin:    General: Skin is warm and dry.     Coloration: Skin is not jaundiced or pale.  Neurological:     Mental Status: He is alert. Mental status is at baseline.     Comments: Oriented to person      Laboratory Data Recent Labs  Lab 04/08/23 2043 04/09/23 0630 04/10/23 0513  WBC 5.3 6.3 6.5  HGB 7.6* 9.9* 10.0*  HCT 24.1* 30.8* 29.8*  PLT 244 206 229   Recent Labs  Lab 04/08/23 1419 04/08/23 1817 04/10/23 0513  NA 137  --  138  K 3.8  --  3.8  CL 109  --  108  CO2 23  --  22  BUN 26*  --  23  CREATININE 1.41*  --  1.48*  CALCIUM 8.2*  --  8.2*  PROT  --  6.4*  --   BILITOT  --  0.2*  --   ALKPHOS  --  47  --   ALT  --  16  --   AST  --  24  --   GLUCOSE 212*   --  118*   Recent Labs  Lab 04/08/23 2043  INR 1.5*      Imaging Studies: CT Head Wo Contrast  Result Date: 04/08/2023 CLINICAL DATA:  Altered mental status, nontraumatic (Ped 0-17y) EXAM: CT HEAD WITHOUT CONTRAST TECHNIQUE: Contiguous axial images were obtained from the base of the skull through the vertex without intravenous contrast. RADIATION DOSE REDUCTION: This exam was performed according to the departmental dose-optimization program which includes automated exposure control, adjustment of the mA and/or kV according to patient size and/or use of iterative reconstruction technique. COMPARISON:  CT head 01/24/2023 FINDINGS: Brain: Cerebral ventricle sizes are concordant with the degree of cerebral volume loss. Patchy and confluent areas of decreased attenuation are noted throughout the deep and periventricular white matter of the cerebral hemispheres bilaterally, compatible with chronic microvascular ischemic disease. No evidence of large-territorial acute infarction. No parenchymal hemorrhage. No mass lesion. No extra-axial collection. No mass effect or midline shift. No hydrocephalus. Basilar cisterns are patent. Vascular: No hyperdense vessel. Atherosclerotic calcifications are present within the cavernous internal carotid and vertebral arteries. Skull: No acute fracture or focal lesion. Sinuses/Orbits: Complete opacification of bilateral frontal sinuses. Sinus surgery noted. Mastoid air cells are clear. The orbits are unremarkable. Other: None. IMPRESSION: No acute intracranial abnormality. Electronically Signed   By: Tish Frederickson M.D.   On: 04/08/2023 20:57   DG Chest 2 View  Result Date: 04/08/2023 CLINICAL DATA:  Near syncope EXAM: CHEST - 2 VIEW COMPARISON:  01/24/2023 FINDINGS: The heart size and mediastinal contours are within normal limits. Both lungs are clear. The visualized skeletal structures are unremarkable. IMPRESSION: No active cardiopulmonary disease. Electronically Signed    By: Ernie Avena M.D.   On: 04/08/2023 15:16    Assessment:   # ABLA- possible melena # Suspected LGIB from previously documented AVMs # PE on Eliquis # Dementia  Plan:  Pt pending dispo today per notation Hgb stable to improved post transfusion for a few values Vital signs stable Given his age and dementia and history of GI bleeding secondary to AVMs we discussed conservative versus aggressive management She is in agreement for conservative management at this time and would like to avoid upper and lower endoscopy  Stop eliquis given recurrent gi bleeding Can continue once daily ppi at home  No plan  for endoscopy at this point of time   Continue to monitor H&H.  Transfusion resuscitation as per primary team Tolerating PO Hold dvt ppx Avoid frequent lab draws to prevent lab induced anemia Supportive care and antiemetics as per primary team Maintain two sites IV access Avoid nsaids Monitor for GIB.  GI to sign off. Available as needed. Please do not hesitate to call regarding questions or concerns.  I personally performed the service.  Management of other medical comorbidities as per primary team  Thank you for allowing [place] to participate in this patient's care. Please don't hesitate to call if any questions or concerns arise.   Jaynie Collins, DO [facility]  Portions of the record may have been created with voice recognition software. Occasional wrong-word or 'sound-a-like' substitutions may have occurred due to the inherent limitations of voice recognition software.  Read the chart carefully and recognize, using context, where substitutions may have occurred.

## 2023-04-10 NOTE — Discharge Summary (Signed)
Physician Discharge Summary   Roger Cook  male DOB: 1927-04-26  WUJ:[id number]  PCP: [name], MD  Admit date: 04/08/2023 Discharge date: 04/10/2023  Admitted From: home Disposition:  home Daughter updated on the phone prior to discharge. CODE STATUS: DNR  Discharge Instructions     Discharge instructions   Complete by: As directed    As discussed with GI doctor and me, since full dose blood thinner is not recommended in the setting of GI bleeding, and you have had blood clots in your lungs before, we have decided to try lower dose Eliquis to help prevent the blood clots.  Eliquis 5 mg tablet can not be cut in half, so I have prescribed you 2.5 mg tablets.  Please follow up with your outpatient provider to monitor your blood level to make sure you are not losing more blood while on low-dose Eliquis.   Dr. Darlin Priestly [facility] Course:  For full details, please see H&P, progress notes, consult notes and ancillary notes.  Briefly,  Roger Cook is a 89 y.o. male with medical history significant for dementia, hypertension, history of TIA, history of prostate cancer, acute blood loss anemia and GI bleed attributed to known history of AVMs on colonoscopy that was done in 2023 and EGD done in feb 2024 was normal - Dr. [name] Creek, presented today for hypotension and recurrent syncope.    Patient reported dark stools morning prior to presentation.  Pt was hypotensive upon standing with blood pressures of 78/36 in the emergency room.     * Postural dizziness with near syncope Due to hypotension.  Not sure if this is due to dysrhythmias or bleeding and hypovolemia.  --home amlodipine and imdur metoprolol held on admission.  BP trended up to 180's the next day, so home BP med resumed.  Iron deficiency anemia due to chronic blood loss with presumed GI source Pt's Hgb dropped to 7.6, was 10.5 back in May 2024. --s/p 1u pRBC --GI consulted, decision made not to  undergo procedure --After discussion with family and pharm, decision made to continue Eliquis reduced dose of 2.5 mg BID due to suspected GI bleed. --discharged on protonix 40 mg BID --f/u with outpatient provider to monitor Hgb.   Hx of Bilateral pulmonary embolism (HCC) --After discussion with family and pharm, decision made to continue Eliquis reduced dose of 2.5 mg BID due to suspected GI bleed.   Chronic kidney disease, stage 3a (HCC)   Dementia (HCC) Cont namenda. Cont paxil.    Hypothyroidism Continue levothyroxine    Essential hypertension --cont home amlodipine, Imdur, Toprol   Unless noted above, medications under "STOP" list are ones pt was not taking PTA.  Discharge Diagnoses:  Principal Problem:   Postural dizziness with near syncope Active Problems:   Bilateral pulmonary embolism (HCC)   Essential hypertension   Hypothyroidism   Melena   Dementia (HCC)   Iron deficiency anemia due to chronic blood loss   Chronic kidney disease, stage 3a (HCC)   30 Day Unplanned Readmission Risk Score    Flowsheet Row ED to Hosp-Admission (Current) from 04/08/2023 in [facility] 1C MEDICAL TELEMETRY  30 Day Unplanned Readmission Risk Score (%) 29.42 Filed at 04/10/2023 0801       This score is the patient's risk of an unplanned readmission within 30 days of being discharged (0 -100%). The score is based on dignosis, age, lab data, medications, orders, and past utilization.  Low:  0-14.9   Medium: 15-21.9   High: 22-29.9   Extreme: 30 and above         Discharge Instructions:  Allergies as of 04/10/2023       Reactions   Levofloxacin Swelling   lips swelling   Povidone Iodine Other (See Comments)   Unknown, can not remember   Sulfa Antibiotics    Unknown, can not remember   Benadryl [diphenhydramine] Rash        Medication List     STOP taking these medications    ketoconazole 2 % cream Commonly known as: NIZORAL   PreviDent  5000 Dry Mouth 1.1 % Gel dental gel Generic drug: sodium fluoride       TAKE these medications    acetaminophen 325 MG tablet Commonly known as: TYLENOL Take 2 tablets (650 mg total) by mouth every 6 (six) hours as needed for mild pain (or Fever >/= 101).   amLODipine 5 MG tablet Commonly known as: NORVASC Take 1 tablet (5 mg total) by mouth every evening. Skip the dose if systolic BP less than 140 mmHg   apixaban 2.5 MG Tabs tablet Commonly known as: ELIQUIS Take 1 tablet (2.5 mg total) by mouth 2 (two) times daily. Reduced dose for for prevention of blood clots, in the setting of likely GI bleed. What changed:  medication strength how much to take additional instructions   ARTIFICIAL TEAR SOLUTION OP Place 1 drop into both eyes 2 (two) times daily as needed (dry eyes).   brimonidine 0.2 % ophthalmic solution Commonly known as: ALPHAGAN Place 1 drop into the left eye 2 (two) times daily.   CALCIUM 1200 PO Take 1,200 mg by mouth daily.   cyanocobalamin 1000 MCG tablet Commonly known as: VITAMIN B12 Take 1 tablet (1,000 mcg total) by mouth daily.   ferrous sulfate 325 (65 FE) MG tablet Take 1 tablet (325 mg total) by mouth every other day.   fluticasone 50 MCG/ACT nasal spray Commonly known as: FLONASE Place 2 sprays into both nostrils daily as needed for allergies.   isosorbide mononitrate 60 MG 24 hr tablet Commonly known as: IMDUR TAKE ONE TABLET EVERY DAY   levothyroxine 88 MCG tablet Commonly known as: SYNTHROID TAKE 1 TABLET EVERY DAY ON EMPTY STOMACHWITH A GLASS OF WATER AT LEAST 30-60 MINBEFORE BREAKFAST   memantine 10 MG tablet Commonly known as: NAMENDA TAKE 1 TABLET BY MOUTH TWO TIMES DAILY   metoprolol succinate 25 MG 24 hr tablet Commonly known as: TOPROL-XL Take 0.5 tablets by mouth daily.   MULTIPLE VITAMIN PO Take 1 tablet by mouth daily.   NON FORMULARY CPAP (Free Text) - Historical Medication  As directed  Started 22-Sep-1994 Active    pantoprazole 40 MG tablet Commonly known as: Protonix Take 1 tablet (40 mg total) by mouth 2 (two) times daily.   PARoxetine 10 MG tablet Commonly known as: PAXIL Take 10 mg by mouth daily.   pravastatin 40 MG tablet Commonly known as: PRAVACHOL TAKE 1 TABLET BY MOUTH DAILY   Prevagen 10 MG Caps Generic drug: Apoaequorin Take 10 mg by mouth daily.         Follow-up Information     [name], MD Follow up in 1 week(s).   Specialty: Family Medicine Contact information: [address] [place] [address] [phone]                 Allergies  Allergen Reactions   Levofloxacin Swelling  lips swelling   Povidone Iodine Other (See Comments)    Unknown, can not remember   Sulfa Antibiotics     Unknown, can not remember   Benadryl [Diphenhydramine] Rash     The results of significant diagnostics from this hospitalization (including imaging, microbiology, ancillary and laboratory) are listed below for reference.   Consultations:   Procedures/Studies: CT Head Wo Contrast  Result Date: 04/08/2023 CLINICAL DATA:  Altered mental status, nontraumatic (Ped 0-17y) EXAM: CT HEAD WITHOUT CONTRAST TECHNIQUE: Contiguous axial images were obtained from the base of the skull through the vertex without intravenous contrast. RADIATION DOSE REDUCTION: This exam was performed according to the departmental dose-optimization program which includes automated exposure control, adjustment of the mA and/or kV according to patient size and/or use of iterative reconstruction technique. COMPARISON:  CT head 01/24/2023 FINDINGS: Brain: Cerebral ventricle sizes are concordant with the degree of cerebral volume loss. Patchy and confluent areas of decreased attenuation are noted throughout the deep and periventricular white matter of the cerebral hemispheres bilaterally, compatible with chronic microvascular ischemic disease. No evidence of large-territorial acute  infarction. No parenchymal hemorrhage. No mass lesion. No extra-axial collection. No mass effect or midline shift. No hydrocephalus. Basilar cisterns are patent. Vascular: No hyperdense vessel. Atherosclerotic calcifications are present within the cavernous internal carotid and vertebral arteries. Skull: No acute fracture or focal lesion. Sinuses/Orbits: Complete opacification of bilateral frontal sinuses. Sinus surgery noted. Mastoid air cells are clear. The orbits are unremarkable. Other: None. IMPRESSION: No acute intracranial abnormality. Electronically Signed   By: Tish Frederickson M.D.   On: 04/08/2023 20:57   DG Chest 2 View  Result Date: 04/08/2023 CLINICAL DATA:  Near syncope EXAM: CHEST - 2 VIEW COMPARISON:  01/24/2023 FINDINGS: The heart size and mediastinal contours are within normal limits. Both lungs are clear. The visualized skeletal structures are unremarkable. IMPRESSION: No active cardiopulmonary disease. Electronically Signed   By: Ernie Avena M.D.   On: 04/08/2023 15:16      Labs: BNP (last 3 results) No results for input(s): "BNP" in the last 8760 hours. Basic Metabolic Panel: Recent Labs  Lab 04/08/23 1419 04/10/23 0513  NA 137 138  K 3.8 3.8  CL 109 108  CO2 23 22  GLUCOSE 212* 118*  BUN 26* 23  CREATININE 1.41* 1.48*  CALCIUM 8.2* 8.2*  MG  --  2.6*   Liver Function Tests: Recent Labs  Lab 04/08/23 1817  AST 24  ALT 16  ALKPHOS 47  BILITOT 0.2*  PROT 6.4*  ALBUMIN 3.5   No results for input(s): "LIPASE", "AMYLASE" in the last 168 hours. No results for input(s): "AMMONIA" in the last 168 hours. CBC: Recent Labs  Lab 04/08/23 1419 04/08/23 2043 04/09/23 0630 04/10/23 0513  WBC 5.7 5.3 6.3 6.5  HGB 8.1* 7.6* 9.9* 10.0*  HCT 26.1* 24.1* 30.8* 29.8*  MCV 100.8* 99.6 96.9 94.6  PLT 243 244 206 229   Cardiac Enzymes: No results for input(s): "CKTOTAL", "CKMB", "CKMBINDEX", "TROPONINI" in the last 168 hours. BNP: Invalid input(s):  "POCBNP" CBG: No results for input(s): "GLUCAP" in the last 168 hours. D-Dimer No results for input(s): "DDIMER" in the last 72 hours. Hgb A1c No results for input(s): "HGBA1C" in the last 72 hours. Lipid Profile No results for input(s): "CHOL", "HDL", "LDLCALC", "TRIG", "CHOLHDL", "LDLDIRECT" in the last 72 hours. Thyroid function studies No results for input(s): "TSH", "T4TOTAL", "T3FREE", "THYROIDAB" in the last 72 hours.  Invalid input(s): "FREET3" Anemia work up No results for input(s): "  VITAMINB12", "FOLATE", "FERRITIN", "TIBC", "IRON", "RETICCTPCT" in the last 72 hours. Urinalysis    Component Value Date/Time   COLORURINE YELLOW (A) 01/24/2023 1953   APPEARANCEUR HAZY (A) 01/24/2023 1953   APPEARANCEUR Cloudy (A) 02/28/2021 1454   LABSPEC 1.012 01/24/2023 1953   LABSPEC 1.018 10/21/2012 1747   PHURINE 6.0 01/24/2023 1953   GLUCOSEU NEGATIVE 01/24/2023 1953   GLUCOSEU Negative 10/21/2012 1747   HGBUR MODERATE (A) 01/24/2023 1953   BILIRUBINUR NEGATIVE 01/24/2023 1953   BILIRUBINUR Negative 02/28/2021 1454   BILIRUBINUR Negative 10/21/2012 1747   KETONESUR NEGATIVE 01/24/2023 1953   PROTEINUR NEGATIVE 01/24/2023 1953   NITRITE NEGATIVE 01/24/2023 1953   LEUKOCYTESUR NEGATIVE 01/24/2023 1953   LEUKOCYTESUR Negative 10/21/2012 1747   Sepsis Labs Recent Labs  Lab 04/08/23 1419 04/08/23 2043 04/09/23 0630 04/10/23 0513  WBC 5.7 5.3 6.3 6.5   Microbiology No results found for this or any previous visit (from the past 240 hour(s)).   Total time spend on discharging this patient, including the last patient exam, discussing the hospital stay, instructions for ongoing care as it relates to all pertinent caregivers, as well as preparing the medical discharge records, prescriptions, and/or referrals as applicable, is 45 minutes.    Darlin Priestly, MD  [facility] 04/10/2023, 9:21 AM

## 2023-04-10 NOTE — Telephone Encounter (Signed)
Letter printed, signed and left at my workstation

## 2023-04-10 NOTE — Care Management Important Message (Signed)
Important Message  Patient Details  Name: Roger Cook MRN: [id number] Date of Birth: 1927-06-11   Medicare Important Message Given:  N/A - LOS <3 / Initial given by admissions     Olegario Messier A Omran Keelin 04/10/2023, 9:14 AM

## 2023-04-10 NOTE — Progress Notes (Signed)
Spoke with wife, informed her that pt being discharged, states that she will be coming to the hospital soon for pt transport home

## 2023-04-10 NOTE — Telephone Encounter (Signed)
Copied from CRM [id number]. Topic: General - Other >> Apr 10, 2023  9:06 AM Everette C wrote: Reason for CRM: The patient's daughter has called to follow up on a previous request for a document   The patient's daughter would like a letter of incapacity created by their PCP   Please contact further when possible

## 2023-04-13 ENCOUNTER — Telehealth: Payer: Self-pay | Admitting: *Deleted

## 2023-04-13 NOTE — Transitions of Care (Post Inpatient/ED Visit) (Signed)
04/13/2023  Name: Roger Cook MRN: [id number] DOB: May 11, 1927  Today's TOC FU Call Status: Today's TOC FU Call Status:: Successful TOC FU Call Competed TOC FU Call Complete Date: 04/13/23  Transition Care Management Follow-up Telephone Call Date of Discharge: 04/10/23 Discharge Facility: [facility]) Type of Discharge: Inpatient Admission Primary Inpatient Discharge Diagnosis:: syncope How have you been since you were released from the hospital?: Better (Per wife he is doing much better) Any questions or concerns?: No  Items Reviewed: Did you receive and understand the discharge instructions provided?: Yes Medications obtained,verified, and reconciled?: Yes (Medications Reviewed) Any new allergies since your discharge?: No Dietary orders reviewed?: No Do you have support at home?: Yes People in Home: spouse Name of Support/Comfort Primary Source: Wife [name] and daughters  Medications Reviewed Today: Medications Reviewed Today     Reviewed by Luella Cook, RN (Case Manager) on 04/13/23 at 1135  Med List Status: <None>   Medication Order Taking? Sig Documenting Provider Last Dose Status Informant  acetaminophen (TYLENOL) 325 MG tablet [id number] Yes Take 2 tablets (650 mg total) by mouth every 6 (six) hours as needed for mild pain (or Fever >/= 101). Loyce Dys, MD Taking Active Spouse/Significant Other, Multiple Informants, Pharmacy Records  amLODipine (NORVASC) 5 MG tablet [id number] Yes Take 1 tablet (5 mg total) by mouth every evening. Skip the dose if systolic BP less than 140 mmHg [name], MD Taking Active Spouse/Significant Other  apixaban (ELIQUIS) 2.5 MG TABS tablet [id number] Yes Take 1 tablet (2.5 mg total) by mouth 2 (two) times daily. Reduced dose for for prevention of blood clots, in the setting of likely GI bleed. Darlin Priestly, MD Taking Active   Apoaequorin [facility]) 10 MG CAPS [id number] Yes Take 10 mg by mouth daily.  [provider] Taking Active Spouse/Significant Other, Multiple Informants, Pharmacy Records  ARTIFICIAL TEAR SOLUTION OP [id number] Yes Place 1 drop into both eyes 2 (two) times daily as needed (dry eyes). [provider] Taking Active Spouse/Significant Other, Multiple Informants, Pharmacy Records           Med Note Marcelino Freestone   Thu Nov 19, 2017  4:01 PM)    brimonidine (ALPHAGAN) 0.2 % ophthalmic solution [id number] Yes Place 1 drop into the left eye 2 (two) times daily. [provider] Taking Active Spouse/Significant Other, Multiple Informants, Pharmacy Records  Calcium Carbonate-Vit D-Min (CALCIUM 1200 PO) [id number] Yes Take 1,200 mg by mouth daily. [provider] Taking Active Spouse/Significant Other, Multiple Informants, Pharmacy Records  cyanocobalamin (VITAMIN B12) 1000 MCG tablet [id number] Yes Take 1 tablet (1,000 mcg total) by mouth daily. [name], DO Taking Active Spouse/Significant Other, Multiple Informants, Pharmacy Records  ferrous sulfate 325 (65 FE) MG tablet [id number] Yes Take 1 tablet (325 mg total) by mouth every other day. [name], MD Taking Active Multiple Informants, Pharmacy Records, Spouse/Significant Other  fluticasone [facility]) 50 MCG/ACT nasal spray [id number] Yes Place 2 sprays into both nostrils daily as needed for allergies. [name], MD Taking Active Spouse/Significant Other, Multiple Informants, Pharmacy Records  isosorbide mononitrate (IMDUR) 60 MG 24 hr tablet [id number] Yes TAKE ONE TABLET EVERY DAY [name], MD Taking Active Spouse/Significant Other, Multiple Informants, Pharmacy Records  levothyroxine (SYNTHROID) 88 MCG tablet [id number] Yes TAKE 1 TABLET EVERY DAY ON EMPTY STOMACHWITH A GLASS OF WATER AT LEAST 30-60 MINBEFORE BREAKFAST [name], MD Taking Active Multiple Informants, Pharmacy Records, Spouse/Significant Other  memantine (NAMENDA) 10 MG tablet  [id number] Yes TAKE 1  TABLET BY MOUTH TWO TIMES DAILY  Patient taking differently: Take 10 mg by mouth 2 (two) times daily.   [name], MD Taking Active Spouse/Significant Other, Multiple Informants, Pharmacy Records  metoprolol succinate (TOPROL-XL) 25 MG 24 hr tablet [id number] Yes Take 0.5 tablets by mouth daily. [provider] Taking Active Spouse/Significant Other  MULTIPLE VITAMIN PO [id number] Yes Take 1 tablet by mouth daily. [provider] Taking Active Spouse/Significant Other, Multiple Informants, Pharmacy Records           Med Note Marcelino Freestone   Thu Nov 19, 2017  4:01 PM)    Clent Demark [id number]  CPAP (Free Text) - Historical Medication  As directed  Started 22-Sep-1994 Active [provider]  Active Spouse/Significant Other, Multiple Informants, Pharmacy Records           Med Note Marcelino Freestone   Thu Nov 19, 2017  4:01 PM)    pantoprazole (PROTONIX) 40 MG tablet [id number] Yes Take 1 tablet (40 mg total) by mouth 2 (two) times daily. Darlin Priestly, MD Taking Active   PARoxetine (PAXIL) 10 MG tablet [id number]  Take 10 mg by mouth daily. [provider]  Active Multiple Informants, Pharmacy Records, Spouse/Significant Other  pravastatin (PRAVACHOL) 40 MG tablet [id number] Yes TAKE 1 TABLET BY MOUTH DAILY Fisher, Demetrios Isaacs, MD Taking Active Multiple Informants, Pharmacy Records, Spouse/Significant Other            Home Care and Equipment/Supplies: Were Home Health Services Ordered?: NA Any new equipment or medical supplies ordered?: NA  Functional Questionnaire: Do you need assistance with bathing/showering or dressing?: Yes Do you need assistance with meal preparation?: Yes Do you need assistance with eating?: No Do you have difficulty maintaining continence: No Do you need assistance with getting out of bed/getting out of a chair/moving?: No Do you have difficulty managing or taking your medications?: Yes  Follow up appointments reviewed: PCP  Follow-up appointment confirmed?: No MD Provider Line Number:[phone] Given: Yes (Daughter will make the appt) Specialist Hospital Follow-up appointment confirmed?: NA Do you need transportation to your follow-up appointment?: No Do you understand care options if your condition(s) worsen?: Yes-patient verbalized understanding  SDOH Interventions Today    Flowsheet Row Most Recent Value  SDOH Interventions   Food Insecurity Interventions Intervention Not Indicated  Housing Interventions Intervention Not Indicated  Transportation Interventions Intervention Not Indicated      Interventions Today    Flowsheet Row Most Recent Value  General Interventions   General Interventions Discussed/Reviewed General Interventions Discussed, General Interventions Reviewed, Doctor Visits  Doctor Visits Discussed/Reviewed Doctor Visits Discussed  Pharmacy Interventions   Pharmacy Dicussed/Reviewed Pharmacy Topics Discussed      TOC Interventions Today    Flowsheet Row Most Recent Value  TOC Interventions   TOC Interventions Discussed/Reviewed TOC Interventions Discussed, TOC Interventions Reviewed        Gean Maidens BSN RN [facility] [phone]

## 2023-04-13 NOTE — Telephone Encounter (Signed)
Roger Cook advised to pick up letter at front desk.

## 2023-04-15 DIAGNOSIS — D649 Anemia, unspecified: Secondary | ICD-10-CM | POA: Diagnosis not present

## 2023-04-15 DIAGNOSIS — R413 Other amnesia: Secondary | ICD-10-CM | POA: Diagnosis not present

## 2023-04-15 DIAGNOSIS — F39 Unspecified mood [affective] disorder: Secondary | ICD-10-CM | POA: Diagnosis not present

## 2023-04-15 DIAGNOSIS — R5383 Other fatigue: Secondary | ICD-10-CM | POA: Diagnosis not present

## 2023-04-22 DIAGNOSIS — K219 Gastro-esophageal reflux disease without esophagitis: Secondary | ICD-10-CM | POA: Diagnosis not present

## 2023-04-22 DIAGNOSIS — R413 Other amnesia: Secondary | ICD-10-CM | POA: Diagnosis not present

## 2023-04-22 DIAGNOSIS — G510 Bell's palsy: Secondary | ICD-10-CM | POA: Diagnosis not present

## 2023-04-22 DIAGNOSIS — I2510 Atherosclerotic heart disease of native coronary artery without angina pectoris: Secondary | ICD-10-CM | POA: Diagnosis not present

## 2023-04-22 DIAGNOSIS — R55 Syncope and collapse: Secondary | ICD-10-CM | POA: Diagnosis not present

## 2023-04-22 DIAGNOSIS — F39 Unspecified mood [affective] disorder: Secondary | ICD-10-CM | POA: Diagnosis not present

## 2023-04-22 DIAGNOSIS — Z79899 Other long term (current) drug therapy: Secondary | ICD-10-CM | POA: Diagnosis not present

## 2023-04-22 DIAGNOSIS — Z9849 Cataract extraction status, unspecified eye: Secondary | ICD-10-CM | POA: Diagnosis not present

## 2023-04-22 DIAGNOSIS — Z9089 Acquired absence of other organs: Secondary | ICD-10-CM | POA: Diagnosis not present

## 2023-04-22 DIAGNOSIS — G4730 Sleep apnea, unspecified: Secondary | ICD-10-CM | POA: Diagnosis not present

## 2023-04-22 DIAGNOSIS — I10 Essential (primary) hypertension: Secondary | ICD-10-CM | POA: Diagnosis not present

## 2023-04-22 DIAGNOSIS — Z7901 Long term (current) use of anticoagulants: Secondary | ICD-10-CM | POA: Diagnosis not present

## 2023-04-22 DIAGNOSIS — Z87891 Personal history of nicotine dependence: Secondary | ICD-10-CM | POA: Diagnosis not present

## 2023-04-22 DIAGNOSIS — E7800 Pure hypercholesterolemia, unspecified: Secondary | ICD-10-CM | POA: Diagnosis not present

## 2023-04-22 DIAGNOSIS — D649 Anemia, unspecified: Secondary | ICD-10-CM | POA: Diagnosis not present

## 2023-04-23 ENCOUNTER — Ambulatory Visit (INDEPENDENT_AMBULATORY_CARE_PROVIDER_SITE_OTHER): Payer: Medicare Other | Admitting: Family Medicine

## 2023-04-23 ENCOUNTER — Inpatient Hospital Stay: Payer: Medicare Other | Admitting: Family Medicine

## 2023-04-23 ENCOUNTER — Encounter: Payer: Self-pay | Admitting: Family Medicine

## 2023-04-23 VITALS — BP 119/48 | HR 87 | Temp 97.9°F | Ht 67.0 in | Wt 144.0 lb

## 2023-04-23 DIAGNOSIS — R55 Syncope and collapse: Secondary | ICD-10-CM

## 2023-04-23 DIAGNOSIS — K2981 Duodenitis with bleeding: Secondary | ICD-10-CM | POA: Diagnosis not present

## 2023-04-23 NOTE — Progress Notes (Signed)
Established patient visit   Patient: Roger Cook   DOB: 02/23/1927   89 y.o. Male  MRN: [id number] Visit Date: 04/23/2023  Today's healthcare provider: Jacky Kindle, FNP  Introduced to nurse practitioner role and practice setting.  All questions answered.  Discussed provider/patient relationship and expectations.  Subjective    HPI HPI     Hospitalization Follow-up    Additional comments: Patient and daughter ore concerned about the number of times the patient has been hospitalized due to 'low blood'.  Patient is on Eliquis due to history of PE.      Last edited by Adline Peals, CMA on 04/23/2023  2:39 PM.      Follow up Hospitalization  Patient was admitted to [facility] on 04/08/23 and discharged on 04/10/23. He was treated for anemia. Treatment for this included 1 unit of PRBCs. Telephone follow up was done on 04/13/23 He reports good compliance with treatment. He reports this condition is resolved.  ----------------------------------------------------------------------------------------- -  Medications: Outpatient Medications Prior to Visit  Medication Sig   acetaminophen (TYLENOL) 325 MG tablet Take 2 tablets (650 mg total) by mouth every 6 (six) hours as needed for mild pain (or Fever >/= 101).   amLODipine (NORVASC) 5 MG tablet Take 1 tablet (5 mg total) by mouth every evening. Skip the dose if systolic BP less than 140 mmHg   apixaban (ELIQUIS) 2.5 MG TABS tablet Take 1 tablet (2.5 mg total) by mouth 2 (two) times daily. Reduced dose for for prevention of blood clots, in the setting of likely GI bleed.   Apoaequorin (PREVAGEN) 10 MG CAPS Take 10 mg by mouth daily.   ARTIFICIAL TEAR SOLUTION OP Place 1 drop into both eyes 2 (two) times daily as needed (dry eyes).   brimonidine (ALPHAGAN) 0.2 % ophthalmic solution Place 1 drop into the left eye 2 (two) times daily.   Calcium Carbonate-Vit D-Min (CALCIUM 1200 PO) Take 1,200 mg by mouth daily.   cyanocobalamin (VITAMIN  B12) 1000 MCG tablet Take 1 tablet (1,000 mcg total) by mouth daily.   ferrous sulfate 325 (65 FE) MG tablet Take 1 tablet (325 mg total) by mouth every other day.   fluticasone (FLONASE) 50 MCG/ACT nasal spray Place 2 sprays into both nostrils daily as needed for allergies.   isosorbide mononitrate (IMDUR) 60 MG 24 hr tablet TAKE ONE TABLET EVERY DAY   levothyroxine (SYNTHROID) 88 MCG tablet TAKE 1 TABLET EVERY DAY ON EMPTY STOMACHWITH A GLASS OF WATER AT LEAST 30-60 MINBEFORE BREAKFAST   memantine (NAMENDA) 10 MG tablet TAKE 1 TABLET BY MOUTH TWO TIMES DAILY (Patient taking differently: Take 10 mg by mouth 2 (two) times daily.)   metoprolol succinate (TOPROL-XL) 25 MG 24 hr tablet Take 0.5 tablets by mouth daily.   MULTIPLE VITAMIN PO Take 1 tablet by mouth daily.   NON FORMULARY CPAP (Free Text) - Historical Medication  As directed  Started 22-Sep-1994 Active   pantoprazole (PROTONIX) 40 MG tablet Take 1 tablet (40 mg total) by mouth 2 (two) times daily.   PARoxetine (PAXIL) 10 MG tablet Take 10 mg by mouth daily.   pravastatin (PRAVACHOL) 40 MG tablet TAKE 1 TABLET BY MOUTH DAILY   No facility-administered medications prior to visit.       Objective    BP (!) 119/48 (BP Location: Left Arm, Patient Position: Sitting, Cuff Size: Normal)   Pulse 87   Temp 97.9 F (36.6 C) (Oral)   Ht 5\' 7"  (1.702 m)  Wt 144 lb (65.3 kg)   SpO2 100%   BMI 22.55 kg/m   Physical Exam Vitals and nursing note reviewed.  Constitutional:      Appearance: Normal appearance. He is normal weight.  HENT:     Head: Normocephalic and atraumatic.  Cardiovascular:     Rate and Rhythm: Normal rate and regular rhythm.     Pulses: Normal pulses.     Heart sounds: Normal heart sounds.  Pulmonary:     Effort: Pulmonary effort is normal.     Breath sounds: Normal breath sounds.  Musculoskeletal:        General: Normal range of motion.     Cervical back: Normal range of motion.  Skin:    General: Skin is  warm and dry.     Capillary Refill: Capillary refill takes less than 2 seconds.  Neurological:     Mental Status: He is alert and oriented to person, place, and time. Mental status is at baseline.     Motor: Weakness present.     Gait: Gait abnormal.     Comments: At baseline; use of walker  Psychiatric:        Mood and Affect: Mood normal.        Behavior: Behavior normal.        Thought Content: Thought content normal.        Judgment: Judgment normal.     No results found for any visits on 04/23/23.  Assessment & Plan     Problem List Items Addressed This Visit       Digestive   Gastrointestinal hemorrhage associated with duodenitis - Primary    Acute on chronic, recurrent S/p hospital stay with 1 unit of PRBCs and titration of pantoprazole to 40 mg BID Will repeat labs given known anemia and CKD Plan to f/u on frequency of checks needed as well as change to every day protonix in 3 months      Relevant Orders   CBC   Basic Metabolic Panel (BMET)    Return if symptoms worsen or fail to improve.      Leilani Merl, FNP, have reviewed all documentation for this visit. The documentation on 04/23/23 for the exam, diagnosis, procedures, and orders are all accurate and complete.  Jacky Kindle, FNP  [facility] [phone] (phone) [phone] (fax)  [facility]

## 2023-04-23 NOTE — Assessment & Plan Note (Signed)
Acute on chronic, recurrent S/p hospital stay with 1 unit of PRBCs and titration of pantoprazole to 40 mg BID Will repeat labs given known anemia and CKD Plan to f/u on frequency of checks needed as well as change to every day protonix in 3 months

## 2023-04-23 NOTE — Patient Instructions (Addendum)
Labs will indicate where your hemoglobin and creatinine are at this time.  Consider use of kidney safe protein shake including Nepro, Suplena, NovaSource Renal, Nutren Renal and ReGen are some kidney-specific nutrition drinks available  Plan to change protonix in 3 months from BID to every day dosing; please reach back out for a new Rx

## 2023-04-29 DIAGNOSIS — D649 Anemia, unspecified: Secondary | ICD-10-CM | POA: Diagnosis not present

## 2023-04-29 DIAGNOSIS — I10 Essential (primary) hypertension: Secondary | ICD-10-CM | POA: Diagnosis not present

## 2023-04-29 DIAGNOSIS — I2510 Atherosclerotic heart disease of native coronary artery without angina pectoris: Secondary | ICD-10-CM | POA: Diagnosis not present

## 2023-04-29 DIAGNOSIS — G510 Bell's palsy: Secondary | ICD-10-CM | POA: Diagnosis not present

## 2023-04-29 DIAGNOSIS — F39 Unspecified mood [affective] disorder: Secondary | ICD-10-CM | POA: Diagnosis not present

## 2023-04-29 DIAGNOSIS — K219 Gastro-esophageal reflux disease without esophagitis: Secondary | ICD-10-CM | POA: Diagnosis not present

## 2023-05-07 DIAGNOSIS — G510 Bell's palsy: Secondary | ICD-10-CM | POA: Diagnosis not present

## 2023-05-07 DIAGNOSIS — K219 Gastro-esophageal reflux disease without esophagitis: Secondary | ICD-10-CM | POA: Diagnosis not present

## 2023-05-07 DIAGNOSIS — F39 Unspecified mood [affective] disorder: Secondary | ICD-10-CM | POA: Diagnosis not present

## 2023-05-07 DIAGNOSIS — D649 Anemia, unspecified: Secondary | ICD-10-CM | POA: Diagnosis not present

## 2023-05-07 DIAGNOSIS — I10 Essential (primary) hypertension: Secondary | ICD-10-CM | POA: Diagnosis not present

## 2023-05-07 DIAGNOSIS — I2510 Atherosclerotic heart disease of native coronary artery without angina pectoris: Secondary | ICD-10-CM | POA: Diagnosis not present

## 2023-05-08 DIAGNOSIS — D649 Anemia, unspecified: Secondary | ICD-10-CM | POA: Diagnosis not present

## 2023-05-08 DIAGNOSIS — K219 Gastro-esophageal reflux disease without esophagitis: Secondary | ICD-10-CM | POA: Diagnosis not present

## 2023-05-08 DIAGNOSIS — F39 Unspecified mood [affective] disorder: Secondary | ICD-10-CM | POA: Diagnosis not present

## 2023-05-08 DIAGNOSIS — I2510 Atherosclerotic heart disease of native coronary artery without angina pectoris: Secondary | ICD-10-CM | POA: Diagnosis not present

## 2023-05-08 DIAGNOSIS — I10 Essential (primary) hypertension: Secondary | ICD-10-CM | POA: Diagnosis not present

## 2023-05-08 DIAGNOSIS — G510 Bell's palsy: Secondary | ICD-10-CM | POA: Diagnosis not present

## 2023-05-11 ENCOUNTER — Inpatient Hospital Stay: Payer: Medicare Other

## 2023-05-11 ENCOUNTER — Observation Stay
Admission: EM | Admit: 2023-05-11 | Discharge: 2023-05-12 | Disposition: A | Discharge status: Home / Self Care | Payer: Medicare Other | Attending: Student in an Organized Health Care Education/Training Program | Admitting: Student in an Organized Health Care Education/Training Program

## 2023-05-11 ENCOUNTER — Encounter: Payer: Self-pay | Admitting: Internal Medicine

## 2023-05-11 ENCOUNTER — Other Ambulatory Visit: Payer: Self-pay

## 2023-05-11 DIAGNOSIS — D649 Anemia, unspecified: Secondary | ICD-10-CM | POA: Diagnosis present

## 2023-05-11 DIAGNOSIS — R55 Syncope and collapse: Principal | ICD-10-CM | POA: Diagnosis present

## 2023-05-11 DIAGNOSIS — F0390 Unspecified dementia without behavioral disturbance: Secondary | ICD-10-CM | POA: Diagnosis not present

## 2023-05-11 DIAGNOSIS — Z7901 Long term (current) use of anticoagulants: Secondary | ICD-10-CM | POA: Insufficient documentation

## 2023-05-11 DIAGNOSIS — I2699 Other pulmonary embolism without acute cor pulmonale: Secondary | ICD-10-CM | POA: Diagnosis present

## 2023-05-11 DIAGNOSIS — G4730 Sleep apnea, unspecified: Secondary | ICD-10-CM | POA: Diagnosis present

## 2023-05-11 DIAGNOSIS — I959 Hypotension, unspecified: Secondary | ICD-10-CM | POA: Diagnosis not present

## 2023-05-11 DIAGNOSIS — G4733 Obstructive sleep apnea (adult) (pediatric): Secondary | ICD-10-CM | POA: Diagnosis present

## 2023-05-11 DIAGNOSIS — I10 Essential (primary) hypertension: Secondary | ICD-10-CM | POA: Diagnosis present

## 2023-05-11 DIAGNOSIS — R531 Weakness: Secondary | ICD-10-CM

## 2023-05-11 DIAGNOSIS — K5900 Constipation, unspecified: Secondary | ICD-10-CM | POA: Diagnosis present

## 2023-05-11 DIAGNOSIS — D500 Iron deficiency anemia secondary to blood loss (chronic): Secondary | ICD-10-CM | POA: Diagnosis present

## 2023-05-11 DIAGNOSIS — R42 Dizziness and giddiness: Secondary | ICD-10-CM

## 2023-05-11 DIAGNOSIS — Z471 Aftercare following joint replacement surgery: Secondary | ICD-10-CM | POA: Diagnosis not present

## 2023-05-11 DIAGNOSIS — R1111 Vomiting without nausea: Secondary | ICD-10-CM | POA: Diagnosis not present

## 2023-05-11 DIAGNOSIS — I700 Atherosclerosis of aorta: Secondary | ICD-10-CM | POA: Diagnosis not present

## 2023-05-11 DIAGNOSIS — R739 Hyperglycemia, unspecified: Secondary | ICD-10-CM | POA: Diagnosis not present

## 2023-05-11 DIAGNOSIS — N183 Chronic kidney disease, stage 3 unspecified: Secondary | ICD-10-CM | POA: Insufficient documentation

## 2023-05-11 DIAGNOSIS — E860 Dehydration: Secondary | ICD-10-CM | POA: Diagnosis not present

## 2023-05-11 DIAGNOSIS — I672 Cerebral atherosclerosis: Secondary | ICD-10-CM | POA: Diagnosis not present

## 2023-05-11 DIAGNOSIS — I209 Angina pectoris, unspecified: Secondary | ICD-10-CM

## 2023-05-11 DIAGNOSIS — Z043 Encounter for examination and observation following other accident: Secondary | ICD-10-CM | POA: Diagnosis not present

## 2023-05-11 DIAGNOSIS — I951 Orthostatic hypotension: Secondary | ICD-10-CM | POA: Diagnosis not present

## 2023-05-11 DIAGNOSIS — E039 Hypothyroidism, unspecified: Secondary | ICD-10-CM | POA: Diagnosis present

## 2023-05-11 DIAGNOSIS — F32A Depression, unspecified: Secondary | ICD-10-CM | POA: Insufficient documentation

## 2023-05-11 LAB — MAGNESIUM: Magnesium: 2.2 mg/dL (ref 1.7–2.4)

## 2023-05-11 LAB — URINALYSIS, ROUTINE W REFLEX MICROSCOPIC
Bilirubin Urine: NEGATIVE
Glucose, UA: NEGATIVE mg/dL
Hgb urine dipstick: NEGATIVE
Ketones, ur: NEGATIVE mg/dL
Leukocytes,Ua: NEGATIVE
Nitrite: NEGATIVE
Protein, ur: NEGATIVE mg/dL
Specific Gravity, Urine: 1.014 (ref 1.005–1.030)
pH: 5 (ref 5.0–8.0)

## 2023-05-11 LAB — BASIC METABOLIC PANEL
Anion gap: 8 (ref 5–15)
BUN: 26 mg/dL — ABNORMAL HIGH (ref 8–23)
CO2: 22 mmol/L (ref 22–32)
Calcium: 8.2 mg/dL — ABNORMAL LOW (ref 8.9–10.3)
Chloride: 108 mmol/L (ref 98–111)
Creatinine, Ser: 1.75 mg/dL — ABNORMAL HIGH (ref 0.61–1.24)
GFR, Estimated: 36 mL/min — ABNORMAL LOW (ref >60)
Glucose, Bld: 204 mg/dL — ABNORMAL HIGH (ref 70–99)
Potassium: 3.9 mmol/L (ref 3.5–5.1)
Sodium: 138 mmol/L (ref 135–145)

## 2023-05-11 LAB — CBC
HCT: 30 % — ABNORMAL LOW (ref 39.0–52.0)
Hemoglobin: 9.3 g/dL — ABNORMAL LOW (ref 13.0–17.0)
MCH: 30.6 pg (ref 26.0–34.0)
MCHC: 31 g/dL (ref 30.0–36.0)
MCV: 98.7 fL (ref 80.0–100.0)
Platelets: 247 10*3/uL (ref 150–400)
RBC: 3.04 MIL/uL — ABNORMAL LOW (ref 4.22–5.81)
RDW: 13.5 % (ref 11.5–15.5)
WBC: 6.7 10*3/uL (ref 4.0–10.5)
nRBC: 0 % (ref 0.0–0.2)

## 2023-05-11 MED ORDER — ADULT MULTIVITAMIN W/MINERALS CH
1.0000 | ORAL_TABLET | Freq: Every day | ORAL | Status: DC
  Administered 2023-05-12: 1 via ORAL
  Filled 2023-05-11: qty 1

## 2023-05-11 MED ORDER — APIXABAN 2.5 MG PO TABS
2.5000 mg | ORAL_TABLET | Freq: Two times a day (BID) | ORAL | Status: DC
  Administered 2023-05-11 – 2023-05-12 (×2): 2.5 mg via ORAL
  Filled 2023-05-11 (×2): qty 1

## 2023-05-11 MED ORDER — ONDANSETRON HCL 4 MG PO TABS: 4.0000 mg | ORAL_TABLET | Freq: Four times a day (QID) | ORAL | Status: DC | PRN

## 2023-05-11 MED ORDER — PRAVASTATIN SODIUM 20 MG PO TABS
40.0000 mg | ORAL_TABLET | Freq: Every day | ORAL | Status: DC
  Administered 2023-05-11: 40 mg via ORAL
  Filled 2023-05-11: qty 2

## 2023-05-11 MED ORDER — CYANOCOBALAMIN 500 MCG PO TABS
1000.0000 ug | ORAL_TABLET | Freq: Every day | ORAL | Status: DC
  Administered 2023-05-11 – 2023-05-12 (×2): 1000 ug via ORAL
  Filled 2023-05-11 (×2): qty 2

## 2023-05-11 MED ORDER — FERROUS SULFATE 325 (65 FE) MG PO TABS
325.0000 mg | ORAL_TABLET | ORAL | Status: DC
  Administered 2023-05-12: 325 mg via ORAL
  Filled 2023-05-11: qty 1

## 2023-05-11 MED ORDER — MEMANTINE HCL 5 MG PO TABS
10.0000 mg | ORAL_TABLET | Freq: Two times a day (BID) | ORAL | Status: DC
  Administered 2023-05-11 – 2023-05-12 (×2): 10 mg via ORAL
  Filled 2023-05-11 (×2): qty 2

## 2023-05-11 MED ORDER — ACETAMINOPHEN 325 MG PO TABS: 650.0000 mg | ORAL_TABLET | Freq: Four times a day (QID) | ORAL | Status: DC | PRN

## 2023-05-11 MED ORDER — FLUTICASONE PROPIONATE 50 MCG/ACT NA SUSP: 2.0000 | Freq: Every day | NASAL | Status: DC | PRN

## 2023-05-11 MED ORDER — SODIUM CHLORIDE 0.9 % IV SOLN: INTRAVENOUS | Status: DC

## 2023-05-11 MED ORDER — POLYETHYLENE GLYCOL 3350 17 G PO PACK: 17.0000 g | PACK | Freq: Two times a day (BID) | ORAL | Status: DC | PRN

## 2023-05-11 MED ORDER — ACETAMINOPHEN 650 MG RE SUPP: 650.0000 mg | Freq: Four times a day (QID) | RECTAL | Status: DC | PRN

## 2023-05-11 MED ORDER — CALCIUM CARBONATE ANTACID 500 MG PO CHEW
400.0000 mg | CHEWABLE_TABLET | Freq: Every day | ORAL | Status: DC
  Administered 2023-05-12: 400 mg via ORAL
  Filled 2023-05-11: qty 2

## 2023-05-11 MED ORDER — BRIMONIDINE TARTRATE 0.2 % OP SOLN
1.0000 [drp] | Freq: Two times a day (BID) | OPHTHALMIC | Status: DC
  Administered 2023-05-11 – 2023-05-12 (×2): 1 [drp] via OPHTHALMIC
  Filled 2023-05-11: qty 5

## 2023-05-11 MED ORDER — PAROXETINE HCL 10 MG PO TABS
10.0000 mg | ORAL_TABLET | Freq: Every day | ORAL | Status: DC
  Administered 2023-05-11 – 2023-05-12 (×2): 10 mg via ORAL
  Filled 2023-05-11 (×2): qty 1

## 2023-05-11 MED ORDER — LACTATED RINGERS IV BOLUS
1000.0000 mL | Freq: Once | INTRAVENOUS | Status: AC
  Administered 2023-05-11: 1000 mL via INTRAVENOUS

## 2023-05-11 MED ORDER — HYDRALAZINE HCL 20 MG/ML IJ SOLN: 5.0000 mg | Freq: Three times a day (TID) | INTRAMUSCULAR | Status: DC | PRN

## 2023-05-11 MED ORDER — ONDANSETRON HCL 4 MG/2ML IJ SOLN: 4.0000 mg | Freq: Four times a day (QID) | INTRAMUSCULAR | Status: DC | PRN

## 2023-05-11 MED ORDER — VITAMIN D 25 MCG (1000 UNIT) PO TABS
1000.0000 [IU] | ORAL_TABLET | Freq: Every day | ORAL | Status: DC
  Administered 2023-05-12: 1000 [IU] via ORAL
  Filled 2023-05-11: qty 1

## 2023-05-11 MED ORDER — LEVOTHYROXINE SODIUM 88 MCG PO TABS
88.0000 ug | ORAL_TABLET | Freq: Every day | ORAL | Status: DC
  Administered 2023-05-12: 88 ug via ORAL
  Filled 2023-05-11: qty 1

## 2023-05-11 MED ORDER — PANTOPRAZOLE SODIUM 40 MG PO TBEC
40.0000 mg | DELAYED_RELEASE_TABLET | Freq: Two times a day (BID) | ORAL | Status: DC
  Administered 2023-05-11 – 2023-05-12 (×2): 40 mg via ORAL
  Filled 2023-05-11 (×2): qty 1

## 2023-05-11 MED ORDER — CALCIUM 1200 1200-1000 MG-UNIT PO CHEW
1.0000 | CHEWABLE_TABLET | Freq: Every day | ORAL | Status: DC
Start: 2023-05-11 — End: 2023-05-11

## 2023-05-11 MED ORDER — SENNOSIDES-DOCUSATE SODIUM 8.6-50 MG PO TABS: 1.0000 | ORAL_TABLET | Freq: Every evening | ORAL | Status: DC | PRN

## 2023-05-11 NOTE — Assessment & Plan Note (Signed)
Senna docusate 1 tablet nightly as needed for mild constipation; GlycoLax/MiraLAX 17 g p.o. twice daily as needed for moderate constipation ordered

## 2023-05-11 NOTE — Assessment & Plan Note (Signed)
Home paroxetine 10 mg daily resumed

## 2023-05-11 NOTE — Assessment & Plan Note (Addendum)
With positive orthostatic vital signs Per nursing documentation laying down: 152/49, heart rate 63; sitting 149/55, heart rate 77; standing 106/86, heart rate 82 Patient felt better post 1 L fluid bolus We will hold home antihypertensive medications on admission AM team can consider higher blood pressure threshold in setting of multiple admissions for hypotension, postural dizziness, and now positive for orthostatic hypotension I will order sodium chloride infusion at 125 L/h on admission and a regular diet (no salt restriction) Other considerations include salt tablet, compression stockings Slowly reintroduce blood pressure medicines Admit to telemetry cardiac, inpatient

## 2023-05-11 NOTE — ED Notes (Signed)
Pt changed out of clothes covered in urine and throw up and changed into a brief and a hospital gown.

## 2023-05-11 NOTE — Assessment & Plan Note (Signed)
-   Fall precaution 

## 2023-05-11 NOTE — ED Notes (Signed)
Pt assisted to use urinal and repositioned in bed, Pt requesting lights turned out. Denies other needs at this time

## 2023-05-11 NOTE — Assessment & Plan Note (Signed)
Home Eliquis 2.5 mg p.o. twice daily resumed

## 2023-05-11 NOTE — ED Provider Notes (Signed)
[facility] Provider Note    Event Date/Time   First MD Initiated Contact with Patient 05/11/23 1626     (approximate)   History   Loss of Consciousness   HPI Roger Cook is a 89 y.o. male with HTN, HLD, poor nutrition, PE on Eliquis, CKD stage III, dementia who presents today for near syncope.  Patient had a witnessed episode by wife while he was sitting in the chair and got pale and leaned forward and vomited.  She is unsure if he fully passed out or not.  They noted no other change in his mental status.  Patient has had multiple ED visits and admissions to the hospital in the past year for syncopal episodes.  Otherwise, he is denying fever, chills, cough, congestion, chest pain, shortness of breath, nausea at this time, melena, hematochezia, hematemesis.  Patient is back at baseline at this time.     Physical Exam   Triage Vital Signs: ED Triage Vitals [05/11/23 1356]  Encounter Vitals Group     BP (!) 130/53     Systolic BP Percentile      Diastolic BP Percentile      Pulse Rate 82     Resp 18     Temp 97.7 F (36.5 C)     Temp Source Oral     SpO2 94 %     Weight      Height      Head Circumference      Peak Flow      Pain Score 0     Pain Loc      Pain Education      Exclude from Growth Chart     Most recent vital signs: Vitals:   05/11/23 1800 05/11/23 1830  BP: (!) 147/61 (!) 156/54  Pulse: 66 68  Resp: (!) 25 18  Temp:    SpO2: 100% 98%   Physical Exam: I have reviewed the vital signs and nursing notes. General: Awake, alert, no acute distress.  Nontoxic appearing. Head:  Atraumatic, normocephalic.   ENT:  EOM intact, PERRL. Oral mucosa is pink and moist with no lesions. Neck: Neck is supple with full range of motion, No meningeal signs. Cardiovascular:  RRR, No murmurs. Peripheral pulses palpable and equal bilaterally. Respiratory:  Symmetrical chest wall expansion.  No rhonchi, rales, or wheezes.  Good air movement  throughout.  No use of accessory muscles.   Musculoskeletal:  No cyanosis or edema. Moving extremities with full ROM Abdomen:  Soft, nontender, nondistended. Neuro:  GCS 15, moving all four extremities, interacting appropriately. Speech clear. Psych:  Calm, appropriate.   Skin:  Warm, dry, no rash.     ED Results / Procedures / Treatments   Labs (all labs ordered are listed, but only abnormal results are displayed) Labs Reviewed  BASIC METABOLIC PANEL - Abnormal; Notable for the following components:      Result Value   Glucose, Bld 204 (*)    BUN 26 (*)    Creatinine, Ser 1.75 (*)    Calcium 8.2 (*)    GFR, Estimated 36 (*)    All other components within normal limits  CBC - Abnormal; Notable for the following components:   RBC 3.04 (*)    Hemoglobin 9.3 (*)    HCT 30.0 (*)    All other components within normal limits  URINALYSIS, ROUTINE W REFLEX MICROSCOPIC  MAGNESIUM  CBG MONITORING, ED     EKG My EKG interpretation:  Rate of 74, normal sinus rhythm, normal axis.  No acute ST elevations or depressions   RADIOLOGY    PROCEDURES:  Critical Care performed: No  Procedures   MEDICATIONS ORDERED IN ED: Medications  lactated ringers bolus 1,000 mL (1,000 mLs Intravenous New Bag/Given 05/11/23 1739)     IMPRESSION / MDM / ASSESSMENT AND PLAN / ED COURSE  I reviewed the triage vital signs and the nursing notes.                              Differential diagnosis includes, but is not limited to, orthostatic hypotension, symptomatic anemia, cardiac arrhythmia, polypharmacy.  Patient's presentation is most consistent with acute presentation with potential threat to life or bodily function.  Patient is a 89 year old male presenting today for syncope and collapse associated with vomiting.  Patient has had multiple presentations to the emergency department in the past year for similar requiring admission.  Hemoglobin is 9.3 on arrival which is slightly diminished  from 2 weeks ago however they deny any hematochezia, melena, or hematemesis.  Patient has had poor p.o. intake and so there is some concern that this is related to orthostatic hypotension from dehydration.  Creatinine is overall comparable to most recent labs with BUN is slightly elevated possibly indicating prerenal concern.  EKG consistent with baseline and no other arrhythmias.  Orthostatics were performed and patient did have orthostatic hypotension and difficulty standing on his feet for any significant period of time.  I do believe symptoms today are related to dehydration and orthostatics which could improve with fluids.  Initially family preferred to go home given his multiple visits.  However, after they saw his orthostatics and difficulty standing, wife was very concerned with him coming home and did not feel safe taking care of him.  Will admit patient to hospitalist for rehydration and physical therapy.  The patient is on the cardiac monitor to evaluate for evidence of arrhythmia and/or significant heart rate changes. Clinical Course as of 05/11/23 1854  Mon May 11, 2023  1810 Had long discussion with wife and daughter in the room.  Patient has had multiple visits for syncope in the past year undergoing significant evaluation with etiologies most often be related to dehydration and low blood pressure.  He did have 1 episode of concern for GI bleed requiring blood transfusion.  He has no indication for blood transfusion at this time although hemoglobin is slightly diminished from his baseline.  Patient was initially orthostatic on arrival here and suspect that his the cause of his symptoms.  Will give 1 L of fluids.  Family states that if patient is no longer asymptomatic after fluids and back to his baseline, they would prefer to go home and not have a repeat admission given his multitude of admissions with otherwise reassuring workup. [DW]  1846 Rediscussed case with family.  Wife feels  uncomfortable with patient coming home has had difficulty standing during his orthostatics.  He was experiencing orthostatic hypotension on bedside examination.  Difficulty with standing.  Will admit to hospitalist for orthostatic hypotension as likely source of syncope today. [DW]    Clinical Course User Index [DW] Janith Lima, MD     FINAL CLINICAL IMPRESSION(S) / ED DIAGNOSES   Final diagnoses:  Syncope and collapse  Orthostatic hypotension  Dehydration     Rx / DC Orders   ED Discharge Orders     None  Note:  This document was prepared using Dragon voice recognition software and may include unintentional dictation errors.   Janith Lima, MD 05/11/23 [name]

## 2023-05-11 NOTE — Assessment & Plan Note (Signed)
-   Levothyroxine 88 mcg daily before breakfast resumed

## 2023-05-11 NOTE — Assessment & Plan Note (Signed)
-  CPAP nightly ordered 

## 2023-05-11 NOTE — ED Notes (Addendum)
Pt repositioned in bed, bed alarm on, non skid socks on, call light within reach, bed low and locked.

## 2023-05-11 NOTE — ED Notes (Signed)
Pt back into bed and denies any needs at this time

## 2023-05-11 NOTE — Assessment & Plan Note (Signed)
-   Memantine 10 mg p.o. twice daily resumed

## 2023-05-11 NOTE — ED Triage Notes (Signed)
Pt arrives via [facility] from home. Per family, pt had syncopal episode while sitting in his chair and vomited. EMS reports improvement in mental status since they've picked him up. Pt has dementia and is at baseline mental status. EMS gave IVF and 4mg  zofran. Pt was seen about a month ago for similar presentation.

## 2023-05-11 NOTE — Hospital Course (Signed)
[name] is a 89 year old male with history of dementia, hypertension, history of TIA, history of prostate cancer, history of acute blood loss anemia attributed to GI bleed, history of AVMs on colonoscopy, history of postural dizziness with near syncope, iron deficiency anemia, history of bilateral PE on Eliquis, dementia, hypothyroid, CKD 3B, who presents to the emergency department for chief concerns of witnessed syncope.  Vitals in the ED showed temp 97.7, respiration rate 14, heart rate 67, blood pressure 154/51, SpO2 97% on room air.  Serum Na 138, K 3.9, chloride 108, bicarb 22, BUN of 26, serum creatinine 1.75, EGFR 36, nonfasting blood glucose 204, WBC 6.7, hemoglobin 9.3, platelets of 247.  ED treatment: LR 1 L bolus.

## 2023-05-11 NOTE — H&P (Addendum)
History and Physical   Roger Cook WJX:[id number] DOB: Feb 03, 1927 DOA: 05/11/2023  PCP: [name], MD Outpatient Specialists: Dr. Olevia Perches clinic cardiology Patient coming from: Home via EMS  I have personally briefly reviewed patient's old medical records in [facility] EMR.  Chief Concern: Syncope, weakness while sitting in his chair with vomiting  HPI: Roger Cook is a 89 year old male with history of dementia, hypertension, history of TIA, history of prostate cancer, history of acute blood loss anemia attributed to GI bleed, history of AVMs on colonoscopy, history of postural dizziness with near syncope, iron deficiency anemia, history of bilateral PE on Eliquis, dementia, hypothyroid, CKD 3B, who presents to the emergency department for chief concerns of witnessed syncope.  Vitals in the ED showed temp 97.7, respiration rate 14, heart rate 67, blood pressure 154/51, SpO2 97% on room air.  Serum Na 138, K 3.9, chloride 108, bicarb 22, BUN of 26, serum creatinine 1.75, EGFR 36, nonfasting blood glucose 204, WBC 6.7, hemoglobin 9.3, platelets of 247.  ED treatment: LR 1 L bolus. -------------------------------- At bedside, patient was able to tell me his name and with multiple-choice was able to tell me that he is in the hospital.  He was not able to tell me the current calendar year, the current month, his age.  He was able to identify his daughter Roger Cook at bedside.  He reports that he is here today because they had a meeting scheduled here.  At bedside he denies being in pain.  Social history: He lives at home with his wife.  He denies tobacco, EtOH, recreational drug use.  He is retired and formerly worked in [employer].  ROS: Unable to complete as patient has moderate to advanced dementia  ED Course: Discussed with emergency medicine provider, patient requiring hospitalization for chief concerns of syncope positive for orthostatic  hypotension.  Assessment/Plan  Principal Problem:   Syncope Active Problems:   Chronic anemia   Constipation   Postural dizziness with near syncope   Bilateral pulmonary embolism (HCC)   Essential hypertension   Severe obstructive sleep apnea   Hypothyroidism   Dementia (HCC)   Iron deficiency anemia due to chronic blood loss   Generalized weakness   Transient hypotension   Depression   Assessment and Plan:  * Syncope With positive orthostatic vital signs Per nursing documentation laying down: 152/49, heart rate 63; sitting 149/55, heart rate 77; standing 106/86, heart rate 82 Patient felt better post 1 L fluid bolus We will hold home antihypertensive medications on admission AM team can consider higher blood pressure threshold in setting of multiple admissions for hypotension, postural dizziness, and now positive for orthostatic hypotension I will order sodium chloride infusion at 125 L/h on admission and a regular diet (no salt restriction) Other considerations include salt tablet, compression stockings Slowly reintroduce blood pressure medicines Admit to telemetry cardiac, inpatient  Chronic anemia Patient's hemoglobin over the last month has been 8.1-10 No transfusion on admission  Constipation Senna docusate 1 tablet nightly as needed for mild constipation; GlycoLax/MiraLAX 17 g p.o. twice daily as needed for moderate constipation ordered  Postural dizziness with near syncope Fall precautions  Bilateral pulmonary embolism (HCC) Home Eliquis 2.5 mg p.o. twice daily resumed  Depression Home paroxetine 10 mg daily resumed  Generalized weakness Fall precaution  Dementia (HCC) Memantine 10 mg p.o. twice daily resumed  Hypothyroidism Levothyroxine 88 mcg daily before breakfast resumed  Severe obstructive sleep apnea CPAP nightly ordered  Essential hypertension Home  antihypertensive medication: Amlodipine 5 mg every evening, isosorbide mononitrate 60 mg  daily, metoprolol succinate 25 mg daily not resumed on admission Hydralazine 5 mg IV every 8 hours as needed for SBP greater 180, 3 days ordered  Chart reviewed.   DVT prophylaxis: Apixaban 2.5 mg p.o. twice daily resumed Code Status: DNR, reviewed ACP documents Diet: Regular diet Family Communication: Updated daughter, Roger Cook at bedside Disposition Plan: Pending clinical course Consults called: None at this time Admission status: Telemetry cardiac, inpatient  Past Medical History:  Diagnosis Date   Anemia    Aortic atherosclerosis (HCC)    Bell palsy    Bilateral renal cysts    Bladder tumor    CAD (coronary artery disease)    DOE (dyspnea on exertion)    Elevated lactic acid level 11/13/2022   GERD (gastroesophageal reflux disease)    History of angina    History of penile implant    History of prostate cancer    Hyperlipidemia    Hypertension    Hypothyroidism    Lipoma    MRSA bacteremia 02/2011   Nephrolithiasis    OSA on CPAP    Pneumonia    as a teenager   Past Surgical History:  Procedure Laterality Date   APPENDECTOMY  2005   CARDIAC CATHETERIZATION N/A 08/13/1990   75% pLCx, 75% mLCx, 25% mLAD, 75% D2, 50% right renal artery; Location: [facility]; Surgeon: Eugenia Pancoast, MD   CATARACT EXTRACTION  2005   also had a macular hole in 2005   CATARACT EXTRACTION  2008   COLONOSCOPY     2003, 2014   COLONOSCOPY WITH PROPOFOL N/A 12/26/2021   Procedure: COLONOSCOPY WITH PROPOFOL;  Surgeon: Midge Minium, MD;  Location: [facility] ENDOSCOPY;  Service: Endoscopy;  Laterality: N/A;   ESOPHAGOGASTRODUODENOSCOPY     2004, 2014   ESOPHAGOGASTRODUODENOSCOPY (EGD) WITH PROPOFOL N/A 12/26/2021   Procedure: ESOPHAGOGASTRODUODENOSCOPY (EGD) WITH PROPOFOL;  Surgeon: Midge Minium, MD;  Location: [facility] ENDOSCOPY;  Service: Endoscopy;  Laterality: N/A;   ESOPHAGOGASTRODUODENOSCOPY (EGD) WITH PROPOFOL N/A 11/15/2022   Procedure: ESOPHAGOGASTRODUODENOSCOPY (EGD) WITH PROPOFOL;  Surgeon: Regis Bill, MD;  Location: [facility] ENDOSCOPY;  Service: Endoscopy;  Laterality: N/A;   HAND SURGERY Left 06/26/2011   Malignancy removed from left hand   NASAL SEPTUM SURGERY  1986   NASAL SINUS SURGERY  1990   penile inplant  1984   polyp of rectum  2003   PROSTATE SURGERY  1975   Abdominal, had to have radiation treatment with the procedure   PROSTATE SURGERY     TONSILLECTOMY     TRANSURETHRAL RESECTION OF BLADDER TUMOR N/A 03/12/2021   Procedure: TRANSURETHRAL RESECTION OF BLADDER TUMOR (TURBT);  Surgeon: Riki Altes, MD;  Location: [facility] ORS;  Service: Urology;  Laterality: N/A;   Social History:  reports that he quit smoking about 60 years ago. His smoking use included cigarettes. He started smoking about 76 years ago. He has a 16 pack-year smoking history. He has never used smokeless tobacco. He reports that he does not currently use alcohol after a past usage of about 2.0 standard drinks of alcohol per week. He reports that he does not use drugs.  Allergies  Allergen Reactions   Levofloxacin Swelling    lips swelling   Povidone Iodine Other (See Comments)    Unknown, can not remember   Sulfa Antibiotics     Unknown, can not remember   Benadryl [Diphenhydramine] Rash   Family History  Problem Relation Age of Onset  Lung cancer Mother    Heart disease Father    Family history: Family history reviewed and not pertinent.  Prior to Admission medications   Medication Sig Start Date End Date Taking? Authorizing Provider  acetaminophen (TYLENOL) 325 MG tablet Take 2 tablets (650 mg total) by mouth every 6 (six) hours as needed for mild pain (or Fever >/= 101). 12/09/22   Loyce Dys, MD  amLODipine (NORVASC) 5 MG tablet Take 1 tablet (5 mg total) by mouth every evening. Skip the dose if systolic BP less than 140 mmHg 01/27/23 01/27/24  [name], MD  apixaban (ELIQUIS) 2.5 MG TABS tablet Take 1 tablet (2.5 mg total) by mouth 2 (two) times daily. Reduced dose for for prevention  of blood clots, in the setting of likely GI bleed. 04/10/23   Darlin Priestly, MD  Apoaequorin (PREVAGEN) 10 MG CAPS Take 10 mg by mouth daily.    [provider]  ARTIFICIAL TEAR SOLUTION OP Place 1 drop into both eyes 2 (two) times daily as needed (dry eyes).    [provider]  brimonidine (ALPHAGAN) 0.2 % ophthalmic solution Place 1 drop into the left eye 2 (two) times daily. 01/25/21   [provider]  Calcium Carbonate-Vit D-Min (CALCIUM 1200 PO) Take 1,200 mg by mouth daily.    [provider]  cyanocobalamin (VITAMIN B12) 1000 MCG tablet Take 1 tablet (1,000 mcg total) by mouth daily. 11/18/22   Esaw Grandchild A, DO  ferrous sulfate 325 (65 FE) MG tablet Take 1 tablet (325 mg total) by mouth every other day. 01/05/23   [name], MD  fluticasone (FLONASE) 50 MCG/ACT nasal spray Place 2 sprays into both nostrils daily as needed for allergies. 09/25/21   [name], MD  isosorbide mononitrate (IMDUR) 60 MG 24 hr tablet TAKE ONE TABLET EVERY DAY 10/20/22   [name], MD  levothyroxine (SYNTHROID) 88 MCG tablet TAKE 1 TABLET EVERY DAY ON EMPTY STOMACHWITH A GLASS OF WATER AT LEAST 30-60 MINBEFORE BREAKFAST 12/24/22   [name], MD  memantine (NAMENDA) 10 MG tablet TAKE 1 TABLET BY MOUTH TWO TIMES DAILY Patient taking differently: Take 10 mg by mouth 2 (two) times daily. 11/19/21   [name], MD  metoprolol succinate (TOPROL-XL) 25 MG 24 hr tablet Take 0.5 tablets by mouth daily. 01/19/23   [provider]  MULTIPLE VITAMIN PO Take 1 tablet by mouth daily.    [provider]  NON FORMULARY CPAP (Free Text) - Historical Medication  As directed  Started 22-Sep-1994 Active 09/22/1994   [provider]  pantoprazole (PROTONIX) 40 MG tablet Take 1 tablet (40 mg total) by mouth 2 (two) times daily. 04/10/23   Darlin Priestly, MD  PARoxetine (PAXIL) 10 MG tablet Take 10 mg by mouth daily.    [provider]  pravastatin  (PRAVACHOL) 40 MG tablet TAKE 1 TABLET BY MOUTH DAILY 12/24/22   [name], MD   Physical Exam: Vitals:   05/11/23 1930 05/11/23 2000 05/11/23 2030 05/11/23 2100  BP: (!) 164/53 (!) 164/54 (!) 143/49 (!) 138/49  Pulse: 73 68 73 75  Resp: 20 20 18 15   Temp:      TempSrc:      SpO2: 99% 97% 97% 96%   Constitutional: appears frail, NAD, calm Eyes: PERRL, lids and conjunctivae normal ENMT: Mucous membranes are moist. Posterior pharynx clear of any exudate or lesions. Age-appropriate dentition. Hearing appropriate Neck: normal, supple, no masses, no thyromegaly  Respiratory: clear to auscultation bilaterally, no wheezing, no crackles. Normal respiratory effort. No accessory muscle use.  Cardiovascular: Regular rate and rhythm, no murmurs / rubs / gallops. No extremity edema. 2+ pedal pulses. No carotid bruits.  Abdomen: no tenderness, no masses palpated, no hepatosplenomegaly. Bowel sounds positive.  Musculoskeletal: no clubbing / cyanosis. No joint deformity upper and lower extremities. Good ROM, no contractures, no atrophy. Normal muscle tone.  Skin: no rashes, lesions, ulcers. No induration Neurologic: Sensation intact. Strength 5/5 in all 4.  Psychiatric: Lacks judgment and insight consistent with history of dementia. Alert and oriented x self and location. Normal mood.   EKG: independently reviewed, showing sinus rhythm with rate of 74, QTc 432  Chest x-ray on Admission: I personally reviewed and chest x-ray was negative for x-ray evidence of acute cardiopulmonary disease.  No results found.  Labs on Admission: I have personally reviewed following labs  CBC: Recent Labs  Lab 05/11/23 1359  WBC 6.7  HGB 9.3*  HCT 30.0*  MCV 98.7  PLT 247   Basic Metabolic Panel: Recent Labs  Lab 05/11/23 1359 05/11/23 1855  NA 138  --   K 3.9  --   CL 108  --   CO2 22  --   GLUCOSE 204*  --   BUN 26*  --   CREATININE 1.75*  --   CALCIUM 8.2*  --   MG  --  2.2   GFR: CrCl  cannot be calculated (Unknown ideal weight.).  Urine analysis:    Component Value Date/Time   COLORURINE YELLOW (A) 05/11/2023 2015   APPEARANCEUR CLEAR (A) 05/11/2023 2015   APPEARANCEUR Cloudy (A) 02/28/2021 1454   LABSPEC 1.014 05/11/2023 2015   LABSPEC 1.018 10/21/2012 1747   PHURINE 5.0 05/11/2023 2015   GLUCOSEU NEGATIVE 05/11/2023 2015   GLUCOSEU Negative 10/21/2012 1747   HGBUR NEGATIVE 05/11/2023 2015   BILIRUBINUR NEGATIVE 05/11/2023 2015   BILIRUBINUR Negative 02/28/2021 1454   BILIRUBINUR Negative 10/21/2012 1747   KETONESUR NEGATIVE 05/11/2023 2015   PROTEINUR NEGATIVE 05/11/2023 2015   NITRITE NEGATIVE 05/11/2023 2015   LEUKOCYTESUR NEGATIVE 05/11/2023 2015   LEUKOCYTESUR Negative 10/21/2012 1747   This document was prepared using Dragon Voice Recognition software and may include unintentional dictation errors.  Dr. Sedalia Muta [facility]  If 7PM-7AM, please contact overnight-coverage provider If 7AM-7PM, please contact day attending provider [email or web address]  05/11/2023, 10:22 PM

## 2023-05-11 NOTE — ED Notes (Signed)
Pt resting in bed, denies needs at this time. Pt family at bedside.

## 2023-05-11 NOTE — ED Notes (Signed)
Warm blanket applied, pt denies other needs

## 2023-05-11 NOTE — Assessment & Plan Note (Addendum)
Patient's hemoglobin over the last month has been 8.1-10 No transfusion on admission

## 2023-05-11 NOTE — Assessment & Plan Note (Signed)
Fall precautions.  

## 2023-05-11 NOTE — ED Notes (Signed)
This RN was assisting pt into bed, coffee ground emesis dripping out of pt nose, RN asked pt wife if that's what it looked like this morning and wife stated it looked like coffee grounds.

## 2023-05-11 NOTE — Assessment & Plan Note (Signed)
Home antihypertensive medication: Amlodipine 5 mg every evening, isosorbide mononitrate 60 mg daily, metoprolol succinate 25 mg daily not resumed on admission Hydralazine 5 mg IV every 8 hours as needed for SBP greater 180, 3 days ordered

## 2023-05-12 ENCOUNTER — Encounter: Payer: Self-pay | Admitting: Internal Medicine

## 2023-05-12 ENCOUNTER — Emergency Department: Payer: Medicare Other

## 2023-05-12 ENCOUNTER — Other Ambulatory Visit: Payer: Self-pay

## 2023-05-12 DIAGNOSIS — I951 Orthostatic hypotension: Secondary | ICD-10-CM | POA: Diagnosis not present

## 2023-05-12 DIAGNOSIS — Z471 Aftercare following joint replacement surgery: Secondary | ICD-10-CM | POA: Diagnosis not present

## 2023-05-12 DIAGNOSIS — R29810 Facial weakness: Secondary | ICD-10-CM | POA: Diagnosis not present

## 2023-05-12 DIAGNOSIS — W19XXXA Unspecified fall, initial encounter: Secondary | ICD-10-CM | POA: Diagnosis not present

## 2023-05-12 DIAGNOSIS — W1839XA Other fall on same level, initial encounter: Secondary | ICD-10-CM | POA: Insufficient documentation

## 2023-05-12 DIAGNOSIS — F0390 Unspecified dementia without behavioral disturbance: Secondary | ICD-10-CM | POA: Insufficient documentation

## 2023-05-12 DIAGNOSIS — E860 Dehydration: Secondary | ICD-10-CM | POA: Diagnosis not present

## 2023-05-12 DIAGNOSIS — I672 Cerebral atherosclerosis: Secondary | ICD-10-CM | POA: Diagnosis not present

## 2023-05-12 DIAGNOSIS — R55 Syncope and collapse: Principal | ICD-10-CM

## 2023-05-12 DIAGNOSIS — R58 Hemorrhage, not elsewhere classified: Secondary | ICD-10-CM | POA: Diagnosis not present

## 2023-05-12 DIAGNOSIS — I10 Essential (primary) hypertension: Secondary | ICD-10-CM | POA: Insufficient documentation

## 2023-05-12 DIAGNOSIS — I959 Hypotension, unspecified: Secondary | ICD-10-CM

## 2023-05-12 DIAGNOSIS — Z043 Encounter for examination and observation following other accident: Secondary | ICD-10-CM | POA: Diagnosis not present

## 2023-05-12 DIAGNOSIS — I209 Angina pectoris, unspecified: Secondary | ICD-10-CM | POA: Diagnosis not present

## 2023-05-12 DIAGNOSIS — I2510 Atherosclerotic heart disease of native coronary artery without angina pectoris: Secondary | ICD-10-CM | POA: Insufficient documentation

## 2023-05-12 LAB — BASIC METABOLIC PANEL
Anion gap: 5 (ref 5–15)
BUN: 18 mg/dL (ref 8–23)
CO2: 24 mmol/L (ref 22–32)
Calcium: 8.5 mg/dL — ABNORMAL LOW (ref 8.9–10.3)
Chloride: 110 mmol/L (ref 98–111)
Creatinine, Ser: 1.47 mg/dL — ABNORMAL HIGH (ref 0.61–1.24)
GFR, Estimated: 44 mL/min — ABNORMAL LOW (ref >60)
Glucose, Bld: 114 mg/dL — ABNORMAL HIGH (ref 70–99)
Potassium: 4.1 mmol/L (ref 3.5–5.1)
Sodium: 139 mmol/L (ref 135–145)

## 2023-05-12 LAB — CBC
HCT: 29.8 % — ABNORMAL LOW (ref 39.0–52.0)
Hemoglobin: 9.7 g/dL — ABNORMAL LOW (ref 13.0–17.0)
MCH: 31.6 pg (ref 26.0–34.0)
MCHC: 32.6 g/dL (ref 30.0–36.0)
MCV: 97.1 fL (ref 80.0–100.0)
Platelets: 197 10*3/uL (ref 150–400)
RBC: 3.07 MIL/uL — ABNORMAL LOW (ref 4.22–5.81)
RDW: 13.3 % (ref 11.5–15.5)
WBC: 7.3 10*3/uL (ref 4.0–10.5)
nRBC: 0 % (ref 0.0–0.2)

## 2023-05-12 LAB — TSH: TSH: 3.3 u[IU]/mL (ref 0.350–4.500)

## 2023-05-12 MED ORDER — ISOSORBIDE MONONITRATE ER 30 MG PO TB24
15.0000 mg | ORAL_TABLET | Freq: Every day | ORAL | 0 refills | Status: AC
Start: 2023-05-12 — End: ?

## 2023-05-12 MED ORDER — METOPROLOL SUCCINATE ER 25 MG PO TB24: 12.5000 mg | ORAL_TABLET | Freq: Every day | ORAL | 0 refills | Status: DC

## 2023-05-12 MED ORDER — ISOSORBIDE MONONITRATE ER 30 MG PO TB24
15.0000 mg | ORAL_TABLET | Freq: Every day | ORAL | Status: DC
  Administered 2023-05-12: 15 mg via ORAL
  Filled 2023-05-12: qty 1

## 2023-05-12 NOTE — Progress Notes (Signed)
Requested orders from MD for [facility] PT and RN , active with [facility]. Barbara Cower is aware.   Darolyn Rua, [place], MSW, [place] [phone]

## 2023-05-12 NOTE — Discharge Instructions (Signed)
It was nice to meet all of you this afternoon.  I'm happy to be sending Roger Cook home in good condition.  As we discussed, his blood pressure medications have been modified and any new changes have been sent to his pharmacy.  He may have a little higher blood pressures at times with stopping these but I think that is better to allow rather than causing his blood pressure and heart rate to be too low and he passes out.  Please follow up with Dr. Juliann Pares to recheck his vitals on the new regimen and discuss if any further adjustments can be made. Another thing to try, if you haven't already, is wearing compression stockings. This can help facilitate his blood flow up where he vitally needs it

## 2023-05-12 NOTE — ED Triage Notes (Signed)
Pt reports mechanical fall at home. Pt on daily thinners but pt unsure if he hit his head. Denies LOC. Uses walker to ambulate. Pt alert and at baseline per wife. Breathing unlabored speaking in full sentences. Pt denies pain anywhere.

## 2023-05-12 NOTE — Care Management CC44 (Signed)
Condition Code 44 Documentation Completed  Patient Details  Name: Roger Cook MRN: [id number] Date of Birth: [birth year]   Condition Code 44 given:  Yes Patient signature on Condition Code 44 notice:  Yes Documentation of 2 MD's agreement:  Yes Code 44 added to claim:  Yes    Kreg Shropshire, RN 05/12/2023, 3:46 PM

## 2023-05-12 NOTE — ED Triage Notes (Signed)
EMS brings pt in from home for a fall; here earlier today for syncopal episode

## 2023-05-12 NOTE — TOC CM/SW Note (Signed)
Received message from [employer] with [facility]. Pt recently had HH services with [facility]

## 2023-05-12 NOTE — ED Notes (Signed)
Pt bed was wet. Was able to stand while this RN cleaned bed and assisted pt into new gown.

## 2023-05-12 NOTE — Discharge Summary (Signed)
Physician Discharge Summary  Patient: Roger Cook EXB:[id number] DOB: [birth year]   Code Status: DNR Admit date: 05/11/2023 Discharge date: 05/12/2023 Disposition: Home, No home health services recommended PCP: [name], Roger Cook  Recommendations for Outpatient Follow-up:  Follow up with PCP within 1-2 weeks Regarding general hospital follow up and preventative care Recommend  Follow up with cardiology   Discharge Diagnoses:  Principal Problem:   Syncope Active Problems:   Chronic anemia   Constipation   Postural dizziness with near syncope   Bilateral pulmonary embolism (HCC)   Essential hypertension   Severe obstructive sleep apnea   Hypothyroidism   Dementia (HCC)   Iron deficiency anemia due to chronic blood loss   Generalized weakness   Transient hypotension   Depression   Dehydration   Orthostatic hypotension   Syncope and collapse  Brief Hospital Course Summary: Roger Cook is a 89 year old male with history of dementia, hypertension, history of TIA, history of prostate cancer, history of acute blood loss anemia attributed to GI bleed, history of AVMs on colonoscopy, history of postural dizziness with near syncope, iron deficiency anemia, history of bilateral PE on Eliquis, dementia, hypothyroid, CKD 3B, who presents to the emergency department for chief concerns of witnessed syncope. He has had several presentations with similar symptoms recently.    Vitals in the ED showed temp 97.7, respiration rate 14, heart rate 67, blood pressure 154/51, SpO2 97% on room air. He had positive orthostatic vital signs.   Serum Na 138, K 3.9, chloride 108, bicarb 22, BUN of 26, serum creatinine 1.75, EGFR 36, nonfasting blood glucose 204, WBC 6.7, hemoglobin 9.3, platelets of 247.   ED treatment: LR 1 L bolus.  His home blood pressure medications were reduced and or held after a shared-decision making discussion with his wife and daughter at bedside. We are favoring a  little higher blood pressure in favoring of avoiding syncope from hypotension or orthostatics.  Thyroid level checked and is within normal limits.   On the day of discharge, he had stable vital signs and was asymptomatic while at rest and while ambulating independently in the ED. He and family expressed understanding that he will need close cardiology follow up for further modifications as needed.   His medication modifications are listed below.   Discharge Condition: Good, improved Recommended discharge diet: Regular healthy diet  Consultations: None   Procedures/Studies: None   Allergies as of 05/12/2023       Reactions   Levofloxacin Swelling   lips swelling   Povidone Iodine Other (See Comments)   Unknown, can not remember   Sulfa Antibiotics    Unknown, can not remember   Benadryl [diphenhydramine] Rash        Medication List     STOP taking these medications    amLODipine 5 MG tablet Commonly known as: NORVASC       TAKE these medications    acetaminophen 325 MG tablet Commonly known as: TYLENOL Take 2 tablets (650 mg total) by mouth every 6 (six) hours as needed for mild pain (or Fever >/= 101).   apixaban 2.5 MG Tabs tablet Commonly known as: ELIQUIS Take 1 tablet (2.5 mg total) by mouth 2 (two) times daily. Reduced dose for for prevention of blood clots, in the setting of likely GI bleed.   ARTIFICIAL TEAR SOLUTION OP Place 1 drop into both eyes 2 (two) times daily as needed (dry eyes).   brimonidine 0.2 % ophthalmic solution Commonly known as:  ALPHAGAN Place 1 drop into the left eye 2 (two) times daily.   CALCIUM 1200 PO Take 1,200 mg by mouth daily.   cyanocobalamin 1000 MCG tablet Commonly known as: VITAMIN B12 Take 1 tablet (1,000 mcg total) by mouth daily.   ferrous sulfate 325 (65 FE) MG tablet Take 1 tablet (325 mg total) by mouth every other day.   fluticasone 50 MCG/ACT nasal spray Commonly known as: FLONASE Place 2 sprays into  both nostrils daily as needed for allergies.   isosorbide mononitrate 30 MG 24 hr tablet Commonly known as: IMDUR Take 0.5 tablets (15 mg total) by mouth daily. What changed:  medication strength how much to take   levothyroxine 88 MCG tablet Commonly known as: SYNTHROID TAKE 1 TABLET EVERY DAY ON EMPTY STOMACHWITH A GLASS OF WATER AT LEAST 30-60 MINBEFORE BREAKFAST   memantine 10 MG tablet Commonly known as: NAMENDA TAKE 1 TABLET BY MOUTH TWO TIMES DAILY   metoprolol succinate 25 MG 24 hr tablet Commonly known as: TOPROL-XL Take 0.5 tablets (12.5 mg total) by mouth daily.   MULTIPLE VITAMIN PO Take 1 tablet by mouth daily.   NON FORMULARY CPAP (Free Text) - Historical Medication  As directed  Started 22-Sep-1994 Active   pantoprazole 40 MG tablet Commonly known as: Protonix Take 1 tablet (40 mg total) by mouth 2 (two) times daily.   PARoxetine 10 MG tablet Commonly known as: PAXIL Take 10 mg by mouth daily.   pravastatin 40 MG tablet Commonly known as: PRAVACHOL TAKE 1 TABLET BY MOUTH DAILY   Prevagen 10 MG Caps Generic drug: Apoaequorin Take 10 mg by mouth daily.        Follow-up Information     Roger Peng Cook, Roger Cook. Schedule an appointment as soon as possible for a visit in 3 day(s).   Specialties: Cardiology, Internal Medicine Contact information: [address] [place] [address] [phone]         [name], Roger Cook. Schedule an appointment as soon as possible for a visit in 1 week(s).   Specialty: Family Medicine Contact information: [address] [place] [address] [phone]                 Subjective   Pt reports no complaints. He is ambulating without any syncopal symptoms. Denies SOB, CP, leg swelling or pre-syncopal symptoms when standing.   All questions and concerns were addressed at time of discharge.  Objective  Blood pressure (!) 104/44, pulse 70, temperature 97.7 F (36.5 C),  temperature source Oral, resp. rate 13, height 5\' 7"  (1.702 m), SpO2 98%.   General: Pt is alert, awake, not in acute distress Cardiovascular: RRR, S1/S2 +, no rubs, no gallops Respiratory: CTA bilaterally, no wheezing, no rhonchi Abdominal: Soft, NT, ND, bowel sounds + Extremities: no edema, no cyanosis  The results of significant diagnostics from this hospitalization (including imaging, microbiology, ancillary and laboratory) are listed below for reference.   Imaging studies: DG Chest Port 1 View  Result Date: 05/11/2023 CLINICAL DATA:  Syncope EXAM: PORTABLE CHEST 1 VIEW COMPARISON:  04/08/2023 FINDINGS: Heart and mediastinal contours are within normal limits. No focal opacities or effusions. No acute bony abnormality. Aortic atherosclerosis. IMPRESSION: No active disease. Electronically Signed   By: Charlett Nose M.Cook.   On: 05/11/2023 22:23    Labs: Basic Metabolic Panel: Recent Labs  Lab 05/11/23 1359 05/11/23 1855 05/12/23 0630  NA 138  --  139  K 3.9  --  4.1  CL 108  --  110  CO2 22  --  24  GLUCOSE 204*  --  114*  BUN 26*  --  18  CREATININE 1.75*  --  1.47*  CALCIUM 8.2*  --  8.5*  MG  --  2.2  --    CBC: Recent Labs  Lab 05/11/23 1359 05/12/23 0630  WBC 6.7 7.3  HGB 9.3* 9.7*  HCT 30.0* 29.8*  MCV 98.7 97.1  PLT 247 197   Microbiology: Results for orders placed or performed during the hospital encounter of 01/24/23  Blood Culture (routine x 2)     Status: None   Collection Time: 01/24/23  7:53 PM   Specimen: BLOOD  Result Value Ref Range Status   Specimen Description BLOOD BLOOD RIGHT ARM  Final   Special Requests   Final    BOTTLES DRAWN AEROBIC AND ANAEROBIC Blood Culture results may not be optimal due to an inadequate volume of blood received in culture bottles   Culture   Final    NO GROWTH 5 DAYS Performed at [facility], [address], [place], [place] [address]    Report Status 01/29/2023 FINAL  Final  Blood Culture (routine x 2)      Status: None   Collection Time: 01/24/23  7:53 PM   Specimen: BLOOD  Result Value Ref Range Status   Specimen Description BLOOD BLOOD LEFT ARM  Final   Special Requests   Final    BOTTLES DRAWN AEROBIC AND ANAEROBIC Blood Culture adequate volume   Culture   Final    NO GROWTH 5 DAYS Performed at [facility], [address] [place], [place] [address]    Report Status 01/29/2023 FINAL  Final  MRSA Next Gen by PCR, Nasal     Status: None   Collection Time: 01/25/23  4:54 AM   Specimen: Nasal Mucosa; Nasal Swab  Result Value Ref Range Status   MRSA by PCR Next Gen NOT DETECTED NOT DETECTED Final    Comment: (NOTE) The GeneXpert MRSA Assay (FDA approved for NASAL specimens only), is one component of a comprehensive MRSA colonization surveillance program. It is not intended to diagnose MRSA infection nor to guide or monitor treatment for MRSA infections. Test performance is not FDA approved in patients less than 87 years old. Performed at [facility], [address], [place], [place] [address]    Time coordinating discharge: Over 30 minutes  Leeroy Bock, Roger Cook  [facility] 05/12/2023, 3:18 PM

## 2023-05-12 NOTE — ED Notes (Signed)
Pt sleeping, family at bedside

## 2023-05-12 NOTE — ED Notes (Signed)
Pt ambulated in room with minimum assistance and walker. O2 stayed at 98-100% RA and HR in 85-95 bpm. Pt tolerated well.

## 2023-05-12 NOTE — Care Management Obs Status (Signed)
MEDICARE OBSERVATION STATUS NOTIFICATION   Patient Details  Name: Roger Cook MRN: [id number] Date of Birth: 08-04-1927   Medicare Observation Status Notification Given:  Yes    Kreg Shropshire, RN 05/12/2023, 3:46 PM

## 2023-05-13 ENCOUNTER — Emergency Department
Admission: EM | Admit: 2023-05-13 | Discharge: 2023-05-13 | Disposition: A | Discharge status: Home / Self Care | Payer: Medicare Other | Source: Home / Self Care | Attending: Emergency Medicine | Admitting: Emergency Medicine

## 2023-05-13 DIAGNOSIS — W19XXXA Unspecified fall, initial encounter: Secondary | ICD-10-CM

## 2023-05-13 NOTE — ED Notes (Signed)
Assumed care of pt at this time. Pt wheeled in from the lobby, attempted to transfer pt to bed but pt and daughter at bedside declined at this time, prefer to stay in wheelchair. Per daughter pt was just seen today for a fall and scans were all negative. Daughter states pt fell again and just wants for pt to get checked out but does not want any more scans at this time. Pt denies pain anywhere. Pt placed on SPO2 and BP monitoring. Provided pt w/ warm blanket.

## 2023-05-13 NOTE — ED Notes (Signed)
ED Provider at bedside. 

## 2023-05-13 NOTE — ED Provider Notes (Signed)
[facility] Provider Note    Event Date/Time   First MD Initiated Contact with Patient 05/13/23 0148     (approximate)   History   Fall   HPI  Roger Cook is a 89 y.o. male   Past medical history of Bell's palsy, CAD, hypertension hyperlipidemia, dementia which is chronically worsening, frequent falls with recent admission.  Comes back to the emergency department because he wandered out of his house and had a witnessed fall by neighbor.  Denies head strike or loss of consciousness.  Unknown mechanism of fall.  Patient demented and unable to provide much of a history, much as obtained by his daughter who is at bedside  Independent Historian contributed to assessment above: Daughter at bedside corroborates information given above  External Medical Documents Reviewed: Hospitalization notes from just yesterday when he was admitted for frequent falls.      Physical Exam   Triage Vital Signs: ED Triage Vitals  Encounter Vitals Group     BP 05/12/23 2002 (!) 148/63     Systolic BP Percentile --      Diastolic BP Percentile --      Pulse Rate 05/12/23 2002 89     Resp 05/12/23 2002 18     Temp 05/12/23 2002 97.9 F (36.6 C)     Temp Source 05/12/23 2002 Oral     SpO2 05/12/23 1950 97 %     Weight 05/12/23 2001 143 lb 15.4 oz (65.3 kg)     Height 05/12/23 2001 5\' 7"  (1.702 m)     Head Circumference --      Peak Flow --      Pain Score 05/12/23 2001 0     Pain Loc --      Pain Education --      Exclude from Growth Chart --     Most recent vital signs: Vitals:   05/13/23 0230 05/13/23 0300  BP: (!) 151/69 (!) 157/55  Pulse: 88 91  Resp:  18  Temp:  98.3 F (36.8 C)  SpO2: 100% 100%    General: Awake, no distress.  CV:  Good peripheral perfusion.  Resp:  Normal effort.  Abd:  No distention.  Other:  Awake alert comfortable appearing pleasant gentleman.  Disoriented.  Facial asymmetry, states by daughter chronic from prior Bell's  palsy.  Moving all extremities.  No signs of head trauma.   ED Results / Procedures / Treatments   Labs (all labs ordered are listed, but only abnormal results are displayed) Labs Reviewed - No data to display     RADIOLOGY I independently reviewed and interpreted CT scan of the head and see no obvious bleeding or midline shift I also reviewed radiologist's formal read.   PROCEDURES:  Critical Care performed: No  Procedures   MEDICATIONS ORDERED IN ED: Medications - No data to display  IMPRESSION / MDM / ASSESSMENT AND PLAN / ED COURSE  I reviewed the triage vital signs and the nursing notes.                                Patient's presentation is most consistent with severe exacerbation of chronic illness.  Differential diagnosis includes, but is not limited to, blunt traumatic injury including C-spine fracture or ICH, deconditioning, syncope, cardiogenic syncope, dysrhythmia   MDM:   Recurrent falls in this patient with advanced dementia, with CT of the head and neck  to assess for acute traumatic injury like ICH or fractures dislocations fortunately looks normal.  He is otherwise back to his baseline function, no acute medical complaints, and had extensive workup with recent hospitalization.  His medical decision-maker daughter is at bedside and defers further laboratory analysis given his recent hospitalization and would like to go home at this time.  I considered hospitalization for admission or observation and in fact offered admission for recurrent falls with patient daughter and medical decision maker declined at this time, stating they want to work with their insurance and home health agencies to provide further assistance at home.        FINAL CLINICAL IMPRESSION(S) / ED DIAGNOSES   Final diagnoses:  Fall, initial encounter     Rx / DC Orders   ED Discharge Orders     None        Note:  This document was prepared using Dragon voice  recognition software and may include unintentional dictation errors.    Pilar Jarvis, MD 05/13/23 [phone]

## 2023-05-13 NOTE — ED Notes (Signed)
Provided pt and daughter with discharge instructions and education. Pt in possession of all belongings. Pt alert and stable at time of discharge. Pt wheeled safely into daughter vehicle.

## 2023-05-14 DIAGNOSIS — I2510 Atherosclerotic heart disease of native coronary artery without angina pectoris: Secondary | ICD-10-CM | POA: Diagnosis not present

## 2023-05-14 DIAGNOSIS — G510 Bell's palsy: Secondary | ICD-10-CM | POA: Diagnosis not present

## 2023-05-14 DIAGNOSIS — F39 Unspecified mood [affective] disorder: Secondary | ICD-10-CM | POA: Diagnosis not present

## 2023-05-14 DIAGNOSIS — K219 Gastro-esophageal reflux disease without esophagitis: Secondary | ICD-10-CM | POA: Diagnosis not present

## 2023-05-14 DIAGNOSIS — I10 Essential (primary) hypertension: Secondary | ICD-10-CM | POA: Diagnosis not present

## 2023-05-14 DIAGNOSIS — D649 Anemia, unspecified: Secondary | ICD-10-CM | POA: Diagnosis not present

## 2023-05-18 DIAGNOSIS — K219 Gastro-esophageal reflux disease without esophagitis: Secondary | ICD-10-CM | POA: Diagnosis not present

## 2023-05-18 DIAGNOSIS — I10 Essential (primary) hypertension: Secondary | ICD-10-CM | POA: Diagnosis not present

## 2023-05-18 DIAGNOSIS — I2510 Atherosclerotic heart disease of native coronary artery without angina pectoris: Secondary | ICD-10-CM | POA: Diagnosis not present

## 2023-05-18 DIAGNOSIS — G510 Bell's palsy: Secondary | ICD-10-CM | POA: Diagnosis not present

## 2023-05-18 DIAGNOSIS — D649 Anemia, unspecified: Secondary | ICD-10-CM | POA: Diagnosis not present

## 2023-05-18 DIAGNOSIS — F39 Unspecified mood [affective] disorder: Secondary | ICD-10-CM | POA: Diagnosis not present

## 2023-05-19 DIAGNOSIS — G4730 Sleep apnea, unspecified: Secondary | ICD-10-CM | POA: Diagnosis not present

## 2023-05-19 DIAGNOSIS — K219 Gastro-esophageal reflux disease without esophagitis: Secondary | ICD-10-CM | POA: Diagnosis not present

## 2023-05-19 DIAGNOSIS — G510 Bell's palsy: Secondary | ICD-10-CM | POA: Diagnosis not present

## 2023-05-19 DIAGNOSIS — D649 Anemia, unspecified: Secondary | ICD-10-CM | POA: Diagnosis not present

## 2023-05-19 DIAGNOSIS — I10 Essential (primary) hypertension: Secondary | ICD-10-CM | POA: Diagnosis not present

## 2023-05-19 DIAGNOSIS — I2510 Atherosclerotic heart disease of native coronary artery without angina pectoris: Secondary | ICD-10-CM | POA: Diagnosis not present

## 2023-05-19 DIAGNOSIS — F39 Unspecified mood [affective] disorder: Secondary | ICD-10-CM | POA: Diagnosis not present

## 2023-05-21 DIAGNOSIS — F39 Unspecified mood [affective] disorder: Secondary | ICD-10-CM | POA: Diagnosis not present

## 2023-05-21 DIAGNOSIS — D649 Anemia, unspecified: Secondary | ICD-10-CM | POA: Diagnosis not present

## 2023-05-21 DIAGNOSIS — I2510 Atherosclerotic heart disease of native coronary artery without angina pectoris: Secondary | ICD-10-CM | POA: Diagnosis not present

## 2023-05-21 DIAGNOSIS — G510 Bell's palsy: Secondary | ICD-10-CM | POA: Diagnosis not present

## 2023-05-21 DIAGNOSIS — I10 Essential (primary) hypertension: Secondary | ICD-10-CM | POA: Diagnosis not present

## 2023-05-21 DIAGNOSIS — K219 Gastro-esophageal reflux disease without esophagitis: Secondary | ICD-10-CM | POA: Diagnosis not present

## 2023-05-22 ENCOUNTER — Telehealth: Payer: Self-pay

## 2023-05-22 DIAGNOSIS — G510 Bell's palsy: Secondary | ICD-10-CM | POA: Diagnosis not present

## 2023-05-22 DIAGNOSIS — Z9849 Cataract extraction status, unspecified eye: Secondary | ICD-10-CM | POA: Diagnosis not present

## 2023-05-22 DIAGNOSIS — Z7901 Long term (current) use of anticoagulants: Secondary | ICD-10-CM | POA: Diagnosis not present

## 2023-05-22 DIAGNOSIS — K219 Gastro-esophageal reflux disease without esophagitis: Secondary | ICD-10-CM | POA: Diagnosis not present

## 2023-05-22 DIAGNOSIS — Z79899 Other long term (current) drug therapy: Secondary | ICD-10-CM | POA: Diagnosis not present

## 2023-05-22 DIAGNOSIS — F39 Unspecified mood [affective] disorder: Secondary | ICD-10-CM | POA: Diagnosis not present

## 2023-05-22 DIAGNOSIS — Z9089 Acquired absence of other organs: Secondary | ICD-10-CM | POA: Diagnosis not present

## 2023-05-22 DIAGNOSIS — I10 Essential (primary) hypertension: Secondary | ICD-10-CM | POA: Diagnosis not present

## 2023-05-22 DIAGNOSIS — G4730 Sleep apnea, unspecified: Secondary | ICD-10-CM | POA: Diagnosis not present

## 2023-05-22 DIAGNOSIS — R413 Other amnesia: Secondary | ICD-10-CM | POA: Diagnosis not present

## 2023-05-22 DIAGNOSIS — Z87891 Personal history of nicotine dependence: Secondary | ICD-10-CM | POA: Diagnosis not present

## 2023-05-22 DIAGNOSIS — I2510 Atherosclerotic heart disease of native coronary artery without angina pectoris: Secondary | ICD-10-CM | POA: Diagnosis not present

## 2023-05-22 DIAGNOSIS — E7800 Pure hypercholesterolemia, unspecified: Secondary | ICD-10-CM | POA: Diagnosis not present

## 2023-05-22 DIAGNOSIS — D649 Anemia, unspecified: Secondary | ICD-10-CM | POA: Diagnosis not present

## 2023-05-22 DIAGNOSIS — R55 Syncope and collapse: Secondary | ICD-10-CM | POA: Diagnosis not present

## 2023-05-22 NOTE — Telephone Encounter (Signed)
Transition Care Management Unsuccessful Follow-up Telephone Call  Date of discharge and from where:  05/13/2023 [facility]  Attempts:  1st Attempt  Reason for unsuccessful TCM follow-up call:  Unable to reach patient  Arla Boutwell [facility]  [facility] Population Health Community Resource Care Guide   [email or web address]  ?? [phone]   Website: [email or web address]  [email or web address]

## 2023-05-26 ENCOUNTER — Telehealth: Payer: Self-pay

## 2023-05-26 NOTE — Telephone Encounter (Signed)
Transition Care Management Unsuccessful Follow-up Telephone Call  Date of discharge and from where:  05/13/2023 [facility]  Attempts:  2nd Attempt  Reason for unsuccessful TCM follow-up call:  Left voice message  Meleena Munroe [facility]  [facility] Population Health Community Resource Care Guide   [email or web address]  ?? [phone]   Website: [email or web address]  [email or web address]

## 2023-05-28 DIAGNOSIS — I10 Essential (primary) hypertension: Secondary | ICD-10-CM | POA: Diagnosis not present

## 2023-05-28 DIAGNOSIS — F39 Unspecified mood [affective] disorder: Secondary | ICD-10-CM | POA: Diagnosis not present

## 2023-05-28 DIAGNOSIS — D649 Anemia, unspecified: Secondary | ICD-10-CM | POA: Diagnosis not present

## 2023-05-28 DIAGNOSIS — I2510 Atherosclerotic heart disease of native coronary artery without angina pectoris: Secondary | ICD-10-CM | POA: Diagnosis not present

## 2023-05-28 DIAGNOSIS — K219 Gastro-esophageal reflux disease without esophagitis: Secondary | ICD-10-CM | POA: Diagnosis not present

## 2023-05-28 DIAGNOSIS — G510 Bell's palsy: Secondary | ICD-10-CM | POA: Diagnosis not present

## 2023-06-01 ENCOUNTER — Ambulatory Visit (INDEPENDENT_AMBULATORY_CARE_PROVIDER_SITE_OTHER): Payer: Medicare Other

## 2023-06-01 VITALS — Ht 67.0 in | Wt 143.0 lb

## 2023-06-01 DIAGNOSIS — Z0000 Encounter for general adult medical examination without abnormal findings: Secondary | ICD-10-CM | POA: Diagnosis not present

## 2023-06-01 NOTE — Patient Instructions (Signed)
Mr. Wyffels , Thank you for taking time to come for your Medicare Wellness Visit. I appreciate your ongoing commitment to your health goals. Please review the following plan we discussed and let me know if I can assist you in the future.   Referrals/Orders/Follow-Ups/Clinician Recommendations: NONE  This is a list of the screening recommended for you and due dates:  Health Maintenance  Topic Date Due   COVID-19 Vaccine (3 - 2023-24 season) 05/24/2023   Flu Shot  12/21/2023*   Medicare Annual Wellness Visit  05/31/2024   DTaP/Tdap/Td vaccine (4 - Td or Tdap) 03/05/2025   Pneumonia Vaccine  Completed   Zoster (Shingles) Vaccine  Completed   HPV Vaccine  Aged Out  *Topic was postponed. The date shown is not the original due date.    Advanced directives: (In Chart) A copy of your advanced directives are scanned into your chart should your provider ever need it.  Next Medicare Annual Wellness Visit scheduled for next year: Yes 06/01/24 @ 3:30pm telephone

## 2023-06-01 NOTE — Progress Notes (Signed)
Subjective:   Roger Cook is a 89 y.o. male who presents for Medicare Annual/Subsequent preventive examination.  Visit Complete: Virtual  I connected with  Roger Cook and his wife Roger Cook on 06/01/23 by speaker telephone and verified that I am speaking with the correct person using two identifiers.  Patient Location: Home  Provider Location: Office/Clinic  I discussed the limitations of evaluation and management by telemedicine. The patient expressed understanding and agreed to proceed.  Vital Signs: Unable to obtain new vitals due to this being a telehealth visit.  Patient Medicare AWV questionnaire was completed by the patient on (not done); I have confirmed that all information answered by patient is correct and no changes since this date.  Review of Systems    Cardiac Risk Factors include: advanced age (>105men, >68 women);hypertension;male gender;dyslipidemia;sedentary lifestyle    Objective:    Today's Vitals   06/01/23 1536 06/01/23 1537  Weight: 143 lb (64.9 kg)   Height: 5\' 7"  (1.702 m)   PainSc:  0-No pain   Body mass index is 22.4 kg/m.     06/01/2023    3:46 PM 05/12/2023    7:00 AM 05/11/2023    1:57 PM 04/09/2023    1:53 PM 04/08/2023    2:07 PM 01/25/2023    9:46 AM 01/08/2023    3:10 PM  Advanced Directives  Does Patient Have a Medical Advance Directive? Yes Yes Unable to assess, patient is non-responsive or altered mental status Yes Yes Yes Yes  Type of Advance Directive Healthcare Power of [place];Living will Out of facility DNR (pink MOST or yellow form)  Living will;Healthcare Power of [address]  Living will Healthcare Power of [place];Living will  Does patient want to make changes to medical advance directive?  No - Patient declined  No - Patient declined  No - Patient declined   Copy of Healthcare Power of Attorney in Chart?    No - copy requested  No - copy requested No - copy requested  Pre-existing out of facility DNR order (yellow form or pink  MOST form)  Pink MOST/Yellow Form most recent copy in chart - Physician notified to receive inpatient order         Current Medications (verified) Outpatient Encounter Medications as of 06/01/2023  Medication Sig   acetaminophen (TYLENOL) 325 MG tablet Take 2 tablets (650 mg total) by mouth every 6 (six) hours as needed for mild pain (or Fever >/= 101).   apixaban (ELIQUIS) 2.5 MG TABS tablet Take 1 tablet (2.5 mg total) by mouth 2 (two) times daily. Reduced dose for for prevention of blood clots, in the setting of likely GI bleed.   Apoaequorin (PREVAGEN) 10 MG CAPS Take 10 mg by mouth daily.   ARTIFICIAL TEAR SOLUTION OP Place 1 drop into both eyes 2 (two) times daily as needed (dry eyes).   brimonidine (ALPHAGAN) 0.2 % ophthalmic solution Place 1 drop into the left eye 2 (two) times daily.   Calcium Carbonate-Vit D-Min (CALCIUM 1200 PO) Take 1,200 mg by mouth daily.   cyanocobalamin (VITAMIN B12) 1000 MCG tablet Take 1 tablet (1,000 mcg total) by mouth daily.   ferrous sulfate 325 (65 FE) MG tablet Take 1 tablet (325 mg total) by mouth every other day.   fluticasone (FLONASE) 50 MCG/ACT nasal spray Place 2 sprays into both nostrils daily as needed for allergies.   isosorbide mononitrate (IMDUR) 30 MG 24 hr tablet Take 0.5 tablets (15 mg total) by mouth daily.   levothyroxine (  SYNTHROID) 88 MCG tablet TAKE 1 TABLET EVERY DAY ON EMPTY STOMACHWITH A GLASS OF WATER AT LEAST 30-60 MINBEFORE BREAKFAST   memantine (NAMENDA) 10 MG tablet TAKE 1 TABLET BY MOUTH TWO TIMES DAILY (Patient taking differently: Take 10 mg by mouth 2 (two) times daily.)   metoprolol succinate (TOPROL-XL) 25 MG 24 hr tablet Take 0.5 tablets (12.5 mg total) by mouth daily.   MULTIPLE VITAMIN PO Take 1 tablet by mouth daily.   NON FORMULARY CPAP (Free Text) - Historical Medication  As directed  Started 22-Sep-1994 Active   pantoprazole (PROTONIX) 40 MG tablet Take 1 tablet (40 mg total) by mouth 2 (two) times daily.   PARoxetine  (PAXIL) 10 MG tablet Take 10 mg by mouth daily.   pravastatin (PRAVACHOL) 40 MG tablet TAKE 1 TABLET BY MOUTH DAILY   No facility-administered encounter medications on file as of 06/01/2023.    Allergies (verified) Levofloxacin, Povidone iodine, Sulfa antibiotics, and Benadryl [diphenhydramine]   History: Past Medical History:  Diagnosis Date   Anemia    Aortic atherosclerosis (HCC)    Bell palsy    Bilateral renal cysts    Bladder tumor    CAD (coronary artery disease)    DOE (dyspnea on exertion)    Elevated lactic acid level 11/13/2022   GERD (gastroesophageal reflux disease)    History of angina    History of penile implant    History of prostate cancer    Hyperlipidemia    Hypertension    Hypothyroidism    Lipoma    MRSA bacteremia 02/2011   Nephrolithiasis    OSA on CPAP    Pneumonia    as a teenager   Past Surgical History:  Procedure Laterality Date   APPENDECTOMY  2005   CARDIAC CATHETERIZATION N/A 08/13/1990   75% pLCx, 75% mLCx, 25% mLAD, 75% D2, 50% right renal artery; Location: [facility]; Surgeon: Eugenia Pancoast, MD   CATARACT EXTRACTION  2005   also had a macular hole in 2005   CATARACT EXTRACTION  2008   COLONOSCOPY     2003, 2014   COLONOSCOPY WITH PROPOFOL N/A 12/26/2021   Procedure: COLONOSCOPY WITH PROPOFOL;  Surgeon: Midge Minium, MD;  Location: [facility] ENDOSCOPY;  Service: Endoscopy;  Laterality: N/A;   ESOPHAGOGASTRODUODENOSCOPY     2004, 2014   ESOPHAGOGASTRODUODENOSCOPY (EGD) WITH PROPOFOL N/A 12/26/2021   Procedure: ESOPHAGOGASTRODUODENOSCOPY (EGD) WITH PROPOFOL;  Surgeon: Midge Minium, MD;  Location: [facility] ENDOSCOPY;  Service: Endoscopy;  Laterality: N/A;   ESOPHAGOGASTRODUODENOSCOPY (EGD) WITH PROPOFOL N/A 11/15/2022   Procedure: ESOPHAGOGASTRODUODENOSCOPY (EGD) WITH PROPOFOL;  Surgeon: Regis Bill, MD;  Location: [facility] ENDOSCOPY;  Service: Endoscopy;  Laterality: N/A;   HAND SURGERY Left 06/26/2011   Malignancy removed from left hand   NASAL  SEPTUM SURGERY  1986   NASAL SINUS SURGERY  1990   penile inplant  1984   polyp of rectum  2003   PROSTATE SURGERY  1975   Abdominal, had to have radiation treatment with the procedure   PROSTATE SURGERY     TONSILLECTOMY     TRANSURETHRAL RESECTION OF BLADDER TUMOR N/A 03/12/2021   Procedure: TRANSURETHRAL RESECTION OF BLADDER TUMOR (TURBT);  Surgeon: Riki Altes, MD;  Location: [facility] ORS;  Service: Urology;  Laterality: N/A;   Family History  Problem Relation Age of Onset   Lung cancer Mother    Heart disease Father    Social History   Socioeconomic History   Marital status: Married    Spouse name: Roger Cook  Number of children: 2   Years of education: Not on file   Highest education level: Bachelor's degree (e.g., BA, AB, BS)  Occupational History   Occupation: retired  Tobacco Use   Smoking status: Former    Current packs/day: 0.00    Average packs/day: 1 pack/day for 16.0 years (16.0 ttl pk-yrs)    Types: Cigarettes    Start date: 34    Quit date: 1964    Years since quitting: 60.7   Smokeless tobacco: Never  Vaping Use   Vaping status: Never Used  Substance and Sexual Activity   Alcohol use: Not Currently    Alcohol/week: 2.0 standard drinks of alcohol    Types: 2 Glasses of wine per week   Drug use: No   Sexual activity: Yes  Other Topics Concern   Not on file  Social History Narrative   Not on file   Social Determinants of Health   Financial Resource Strain: Low Risk  (06/01/2023)   Overall Financial Resource Strain (CARDIA)    Difficulty of Paying Living Expenses: Not hard at all  Food Insecurity: No Food Insecurity (06/01/2023)   Hunger Vital Sign    Worried About Running Out of Food in the Last Year: Never true    Ran Out of Food in the Last Year: Never true  Transportation Needs: No Transportation Needs (06/01/2023)   PRAPARE - Administrator, Civil Service (Medical): No    Lack of Transportation (Non-Medical): No  Physical Activity:  Inactive (06/01/2023)   Exercise Vital Sign    Days of Exercise per Week: 0 days    Minutes of Exercise per Session: 0 min  Stress: No Stress Concern Present (06/01/2023)   [employer] of Occupational Health - Occupational Stress Questionnaire    Feeling of Stress : Not at all  Social Connections: Socially Isolated (06/01/2023)   Social Connection and Isolation Panel [NHANES]    Frequency of Communication with Friends and Family: Once a week    Frequency of Social Gatherings with Friends and Family: Never    Attends Religious Services: Never    Database administrator or Organizations: No    Attends Engineer, structural: Never    Marital Status: Married    Tobacco Counseling Counseling given: Not Answered  Clinical Intake:  Pre-visit preparation completed: No  Pain : No/denies pain Pain Score: 0-No pain    BMI - recorded: 22.4 Nutritional Status: BMI of 19-24  Normal Nutritional Risks: None Diabetes: No  How often do you need to have someone help you when you read instructions, pamphlets, or other written materials from your doctor or pharmacy?: 1 - Never  Interpreter Needed?: No  Comments: lives with wife Information entered by :: B.Kamsiyochukwu Spickler,LPN   Activities of Daily Living    06/01/2023    3:47 PM 05/12/2023    7:00 AM  In your present state of health, do you have any difficulty performing the following activities:  Hearing? 1 1  Vision? 0 0  Difficulty concentrating or making decisions? 1 1  Walking or climbing stairs? 0 1  Dressing or bathing?  1  Doing errands, shopping? 1 0  Preparing Food and eating ? Y   Using the Toilet? Y   In the past six months, have you accidently leaked urine? Y   Do you have problems with loss of bowel control? Y   Managing your Medications? Y   Managing your Finances? Y   Housekeeping or managing your  Housekeeping? Y     Patient Care Team: [name], MD as PCP - General (Family Medicine) Galen Manila, MD  as Referring Physician (Ophthalmology) Riki Altes, MD (Urology) Dasher, Cliffton Asters, MD (Dermatology) Jeralyn Ruths, MD as Consulting Physician (Oncology) Morene Crocker, MD as Referring Physician (Neurology)  Indicate any recent Medical Services you may have received from other than [facility] providers in the past year (date may be approximate).     Assessment:   This is a routine wellness examination for Randle.  Hearing/Vision screen Hearing Screening - Comments:: Inadequate hearing (has hearing aids never worn) Vision Screening - Comments:: Adequate vision with glasses [facility]   Goals Addressed             This Visit's Progress    DIET - INCREASE WATER INTAKE   Not on track    Recommend increasing water intake to 6 glasses a day.        Depression Screen    06/01/2023    3:44 PM 04/23/2023    2:41 PM 01/29/2023    3:16 PM 01/05/2023    2:52 PM 10/13/2022    2:08 PM 06/30/2022    3:52 PM 12/23/2021    2:57 PM  PHQ 2/9 Scores  PHQ - 2 Score 0 0 0 0 0 2 0  PHQ- 9 Score  2 0 0 2 7 0    Fall Risk    06/01/2023    3:40 PM 04/23/2023    2:41 PM 01/29/2023    3:16 PM 01/05/2023    2:52 PM 10/13/2022    2:08 PM  Fall Risk   Falls in the past year? 1 1 0 0 0  Number falls in past yr: 1 1 0 0   Injury with Fall? 1 1 0 0 0  Risk for fall due to : No Fall Risks  No Fall Risks    Follow up Falls prevention discussed;Education provided  Falls evaluation completed      MEDICARE RISK AT HOME: Medicare Risk at Home Any stairs in or around the home?: Yes If so, are there any without handrails?: Yes Home free of loose throw rugs in walkways, pet beds, electrical cords, etc?: Yes Adequate lighting in your home to reduce risk of falls?: Yes Life alert?: No Use of a cane, walker or w/c?: Yes Grab bars in the bathroom?: No Shower chair or bench in shower?: Yes Elevated toilet seat or a handicapped toilet?: No  TIMED UP AND GO:  Was the test performed?  No    Cognitive  Function: PT UNABLE TO ANSWER ANY OF THESE QUESTIONS (AND PER WIFE)        12/23/2021    3:09 PM 11/06/2016   10:02 AM  6CIT Screen  What Year? 4 points 0 points  What month? 3 points 0 points  What time? 3 points 0 points  Count back from 20 0 points 0 points  Months in reverse 0 points 0 points  Repeat phrase 10 points 6 points  Total Score 20 points 6 points    Immunizations Immunization History  Administered Date(s) Administered   Fluad Quad(high Dose 65+) 06/08/2019, 06/05/2020   Influenza Split 06/19/2009, 06/11/2010, 05/09/2011, 07/01/2012   Influenza, High Dose Seasonal PF 06/22/2014, 07/04/2015, 06/05/2016, 06/18/2017, 07/12/2018, 07/02/2020, 06/28/2021   Influenza,inj,Quad PF,6+ Mos 05/28/2013   PFIZER(Purple Top)SARS-COV-2 Vaccination 09/28/2019, 10/22/2019   Pneumococcal Conjugate-13 03/10/2014   Pneumococcal Polysaccharide-23 09/22/1996   Tdap 09/09/2002, 08/10/2013, 03/06/2015   Zoster  Recombinant(Shingrix) 04/02/2018, 08/04/2018   Zoster, Live 07/16/2006    TDAP status: Up to date  Flu Vaccine status: Due, Education has been provided regarding the importance of this vaccine. Advised may receive this vaccine at local pharmacy or Health Dept. Aware to provide a copy of the vaccination record if obtained from local pharmacy or Health Dept. Verbalized acceptance and understanding.  Pneumococcal vaccine status: Up to date  Covid-19 vaccine status: Completed vaccines  Qualifies for Shingles Vaccine? Yes   Zostavax completed Yes   Shingrix Completed?: Yes  Screening Tests Health Maintenance  Topic Date Due   COVID-19 Vaccine (3 - 2023-24 season) 05/24/2023   INFLUENZA VACCINE  12/21/2023 (Originally 04/23/2023)   Medicare Annual Wellness (AWV)  05/31/2024   DTaP/Tdap/Td (4 - Td or Tdap) 03/05/2025   Pneumonia Vaccine 49+ Years old  Completed   Zoster Vaccines- Shingrix  Completed   HPV VACCINES  Aged Out    Health Maintenance  Health Maintenance Due   Topic Date Due   COVID-19 Vaccine (3 - 2023-24 season) 05/24/2023    Colorectal cancer screening: No longer required.   Lung Cancer Screening: (Low Dose CT Chest recommended if Age 87-80 years, 20 pack-year currently smoking OR have quit w/in 15years.) does not qualify.   Lung Cancer Screening Referral: no  Additional Screening:  Hepatitis C Screening: does not qualify; Completed yes  Vision Screening: Recommended annual ophthalmology exams for early detection of glaucoma and other disorders of the eye. Is the patient up to date with their annual eye exam?  no..declines Who is the provider or what is the name of the office in which the patient attends annual eye exams? [facility] If pt is not established with a provider, would they like to be referred to a provider to establish care? No .   Dental Screening: Recommended annual dental exams for proper oral hygiene  Diabetic Foot Exam: n/a  Community Resource Referral / Chronic Care Management: CRR required this visit?  No   CCM required this visit?  No    Plan:     I have personally reviewed and noted the following in the patient's chart:   Medical and social history Use of alcohol, tobacco or illicit drugs  Current medications and supplements including opioid prescriptions. Patient is not currently taking opioid prescriptions. Functional ability and status Nutritional status Physical activity Advanced directives List of other physicians Hospitalizations, surgeries, and ER visits in previous 12 months Vitals Screenings to include cognitive, depression, and falls Referrals and appointments  In addition, I have reviewed and discussed with patient certain preventive protocols, quality metrics, and best practice recommendations. A written personalized care plan for preventive services as well as general preventive health recommendations were provided to patient.    Sue Lush, LPN   09/27/1094   After Visit  Summary: (MyChart) Due to this being a telephonic visit, the after visit summary with patients personalized plan was offered to patient via MyChart   Nurse Notes: I spoke with pt and wife by speaker phone. Pt wife indicates his dementia has progressed but they are doing alright for now. They have no questions or comments at this time.

## 2023-06-02 ENCOUNTER — Emergency Department: Payer: Medicare Other

## 2023-06-02 ENCOUNTER — Inpatient Hospital Stay
Admission: EM | Admit: 2023-06-02 | Discharge: 2023-06-04 | DRG: 377 | Disposition: A | Discharge status: Home / Self Care | Payer: Medicare Other | Attending: Internal Medicine | Admitting: Internal Medicine

## 2023-06-02 ENCOUNTER — Other Ambulatory Visit: Payer: Self-pay

## 2023-06-02 DIAGNOSIS — F0394 Unspecified dementia, unspecified severity, with anxiety: Secondary | ICD-10-CM | POA: Diagnosis present

## 2023-06-02 DIAGNOSIS — N179 Acute kidney failure, unspecified: Secondary | ICD-10-CM | POA: Diagnosis present

## 2023-06-02 DIAGNOSIS — Z960 Presence of urogenital implants: Secondary | ICD-10-CM | POA: Diagnosis present

## 2023-06-02 DIAGNOSIS — Z87891 Personal history of nicotine dependence: Secondary | ICD-10-CM

## 2023-06-02 DIAGNOSIS — E785 Hyperlipidemia, unspecified: Secondary | ICD-10-CM

## 2023-06-02 DIAGNOSIS — Z9181 History of falling: Secondary | ICD-10-CM | POA: Diagnosis not present

## 2023-06-02 DIAGNOSIS — G9341 Metabolic encephalopathy: Secondary | ICD-10-CM | POA: Diagnosis present

## 2023-06-02 DIAGNOSIS — R2681 Unsteadiness on feet: Secondary | ICD-10-CM | POA: Diagnosis not present

## 2023-06-02 DIAGNOSIS — G4733 Obstructive sleep apnea (adult) (pediatric): Secondary | ICD-10-CM | POA: Diagnosis present

## 2023-06-02 DIAGNOSIS — F419 Anxiety disorder, unspecified: Secondary | ICD-10-CM | POA: Diagnosis not present

## 2023-06-02 DIAGNOSIS — R4182 Altered mental status, unspecified: Secondary | ICD-10-CM | POA: Diagnosis not present

## 2023-06-02 DIAGNOSIS — Z79890 Hormone replacement therapy (postmenopausal): Secondary | ICD-10-CM

## 2023-06-02 DIAGNOSIS — K219 Gastro-esophageal reflux disease without esophagitis: Secondary | ICD-10-CM | POA: Diagnosis present

## 2023-06-02 DIAGNOSIS — R404 Transient alteration of awareness: Secondary | ICD-10-CM | POA: Diagnosis not present

## 2023-06-02 DIAGNOSIS — I2510 Atherosclerotic heart disease of native coronary artery without angina pectoris: Secondary | ICD-10-CM | POA: Diagnosis present

## 2023-06-02 DIAGNOSIS — D509 Iron deficiency anemia, unspecified: Secondary | ICD-10-CM | POA: Diagnosis not present

## 2023-06-02 DIAGNOSIS — Z7901 Long term (current) use of anticoagulants: Secondary | ICD-10-CM

## 2023-06-02 DIAGNOSIS — I959 Hypotension, unspecified: Secondary | ICD-10-CM | POA: Diagnosis not present

## 2023-06-02 DIAGNOSIS — N189 Chronic kidney disease, unspecified: Secondary | ICD-10-CM | POA: Diagnosis not present

## 2023-06-02 DIAGNOSIS — Z8546 Personal history of malignant neoplasm of prostate: Secondary | ICD-10-CM | POA: Diagnosis not present

## 2023-06-02 DIAGNOSIS — N1832 Chronic kidney disease, stage 3b: Secondary | ICD-10-CM | POA: Diagnosis not present

## 2023-06-02 DIAGNOSIS — D649 Anemia, unspecified: Secondary | ICD-10-CM | POA: Diagnosis present

## 2023-06-02 DIAGNOSIS — Z8719 Personal history of other diseases of the digestive system: Secondary | ICD-10-CM

## 2023-06-02 DIAGNOSIS — Z79899 Other long term (current) drug therapy: Secondary | ICD-10-CM

## 2023-06-02 DIAGNOSIS — K921 Melena: Secondary | ICD-10-CM | POA: Diagnosis present

## 2023-06-02 DIAGNOSIS — Z9049 Acquired absence of other specified parts of digestive tract: Secondary | ICD-10-CM

## 2023-06-02 DIAGNOSIS — I10 Essential (primary) hypertension: Secondary | ICD-10-CM | POA: Diagnosis not present

## 2023-06-02 DIAGNOSIS — I700 Atherosclerosis of aorta: Secondary | ICD-10-CM | POA: Diagnosis not present

## 2023-06-02 DIAGNOSIS — Z86711 Personal history of pulmonary embolism: Secondary | ICD-10-CM | POA: Diagnosis not present

## 2023-06-02 DIAGNOSIS — D62 Acute posthemorrhagic anemia: Secondary | ICD-10-CM | POA: Diagnosis present

## 2023-06-02 DIAGNOSIS — M6281 Muscle weakness (generalized): Secondary | ICD-10-CM | POA: Diagnosis not present

## 2023-06-02 DIAGNOSIS — Z8249 Family history of ischemic heart disease and other diseases of the circulatory system: Secondary | ICD-10-CM

## 2023-06-02 DIAGNOSIS — F05 Delirium due to known physiological condition: Secondary | ICD-10-CM | POA: Diagnosis not present

## 2023-06-02 DIAGNOSIS — Z66 Do not resuscitate: Secondary | ICD-10-CM | POA: Diagnosis present

## 2023-06-02 DIAGNOSIS — R55 Syncope and collapse: Secondary | ICD-10-CM | POA: Diagnosis not present

## 2023-06-02 DIAGNOSIS — G4730 Sleep apnea, unspecified: Secondary | ICD-10-CM | POA: Diagnosis not present

## 2023-06-02 DIAGNOSIS — Z888 Allergy status to other drugs, medicaments and biological substances status: Secondary | ICD-10-CM

## 2023-06-02 DIAGNOSIS — Z881 Allergy status to other antibiotic agents status: Secondary | ICD-10-CM

## 2023-06-02 DIAGNOSIS — E039 Hypothyroidism, unspecified: Secondary | ICD-10-CM | POA: Diagnosis present

## 2023-06-02 DIAGNOSIS — F03918 Unspecified dementia, unspecified severity, with other behavioral disturbance: Secondary | ICD-10-CM

## 2023-06-02 DIAGNOSIS — R001 Bradycardia, unspecified: Secondary | ICD-10-CM | POA: Diagnosis not present

## 2023-06-02 DIAGNOSIS — K922 Gastrointestinal hemorrhage, unspecified: Secondary | ICD-10-CM | POA: Diagnosis not present

## 2023-06-02 DIAGNOSIS — Z1152 Encounter for screening for COVID-19: Secondary | ICD-10-CM | POA: Diagnosis not present

## 2023-06-02 DIAGNOSIS — I129 Hypertensive chronic kidney disease with stage 1 through stage 4 chronic kidney disease, or unspecified chronic kidney disease: Secondary | ICD-10-CM | POA: Diagnosis present

## 2023-06-02 DIAGNOSIS — Z882 Allergy status to sulfonamides status: Secondary | ICD-10-CM

## 2023-06-02 DIAGNOSIS — R410 Disorientation, unspecified: Secondary | ICD-10-CM | POA: Diagnosis not present

## 2023-06-02 DIAGNOSIS — R0989 Other specified symptoms and signs involving the circulatory and respiratory systems: Secondary | ICD-10-CM | POA: Diagnosis not present

## 2023-06-02 DIAGNOSIS — Z87442 Personal history of urinary calculi: Secondary | ICD-10-CM

## 2023-06-02 DIAGNOSIS — I6782 Cerebral ischemia: Secondary | ICD-10-CM | POA: Diagnosis not present

## 2023-06-02 DIAGNOSIS — W19XXXA Unspecified fall, initial encounter: Secondary | ICD-10-CM | POA: Diagnosis not present

## 2023-06-02 DIAGNOSIS — R531 Weakness: Secondary | ICD-10-CM | POA: Diagnosis not present

## 2023-06-02 DIAGNOSIS — F0390 Unspecified dementia without behavioral disturbance: Secondary | ICD-10-CM | POA: Diagnosis not present

## 2023-06-02 NOTE — ED Notes (Signed)
Wife and caregiver at bedside. Caregiver said around 2200 he woke up from sleep and had to urinate, but was to tired to get up. Wife then came to see him and he wasn't speaking to her, but just laying there and looked pale.

## 2023-06-02 NOTE — ED Provider Notes (Signed)
[facility] Provider Note    Event Date/Time   First MD Initiated Contact with Patient 06/02/23 2318     (approximate)   History   Altered mental status  HPI  Level V caveat: Limited by dementia  Roger Cook is a 89 y.o. male brought to the ED via EMS from home with a chief complaint of altered mental status.  Patient has a overnight caregiver; it was her first shift with him tonight.  She checked on him and asked if he needed to use the restroom.  He shook his head and looked like he was going to sleep.  Wife checked on him at that time and states he looked pale and looks like he was staring into space which she admits he often does.  Called EMS at that time to have patient evaluated.  Patient is pleasantly demented without complaints.  Wife reports baseline facial droop, baseline oriented to self only.  Wife denies recent illness.     Past Medical History   Past Medical History:  Diagnosis Date   Anemia    Aortic atherosclerosis (HCC)    Bell palsy    Bilateral renal cysts    Bladder tumor    CAD (coronary artery disease)    DOE (dyspnea on exertion)    Elevated lactic acid level 11/13/2022   GERD (gastroesophageal reflux disease)    History of angina    History of penile implant    History of prostate cancer    Hyperlipidemia    Hypertension    Hypothyroidism    Lipoma    MRSA bacteremia 02/2011   Nephrolithiasis    OSA on CPAP    Pneumonia    as a teenager     Active Problem List   Patient Active Problem List   Diagnosis Date Noted   Dehydration 05/12/2023   Orthostatic hypotension 05/12/2023   Syncope and collapse 05/12/2023   Depression 05/11/2023   Gastrointestinal hemorrhage associated with duodenitis 04/23/2023   Postural dizziness with near syncope 04/08/2023   Hospital discharge follow-up 01/29/2023   History of bradycardia 01/29/2023   History of syncope 01/29/2023   Transient hypotension 01/29/2023   AMS (altered  mental status) 01/24/2023   Sinus bradycardia 01/24/2023   Bilateral pulmonary embolism (HCC) 12/06/2022   Chronic kidney disease, stage 3a (HCC) 12/06/2022   Vitamin B12 deficiency 11/17/2022   Generalized weakness 11/17/2022   Positive blood culture 11/15/2022   Chronic anemia 11/13/2022   Hyperglycemia 07/09/2022   Iron deficiency anemia due to chronic blood loss    Angiodysplasia of intestinal tract    Severe anemia 12/24/2021   Angina pectoris (HCC) 09/18/2020   Dementia (HCC) 09/18/2020   Renal hematoma, right, subsequent encounter 04/25/2018   Localized osteoporosis of spine 12/23/2017   Constipation 11/06/2016   Bell's palsy 02/26/2015   Nephrogenic adenoma of bladder 02/26/2015   Folliculitis 02/26/2015   Personal history of methicillin resistant Staphylococcus aureus 02/26/2015   Fatty tumor 02/26/2015   Syncope 02/26/2015   Fungal infection of nail 02/26/2015   H/O malignant neoplasm of prostate 02/26/2015   Melena 10/18/2012   Arteriosclerosis of coronary artery 01/05/2009   Severe obstructive sleep apnea 01/05/2009   Hypothyroidism 01/05/2009   Essential hypertension 09/22/1998     Past Surgical History   Past Surgical History:  Procedure Laterality Date   APPENDECTOMY  2005   CARDIAC CATHETERIZATION N/A 08/13/1990   75% pLCx, 75% mLCx, 25% mLAD, 75% D2, 50% right renal  artery; Location: [facility]; Surgeon: Eugenia Pancoast, MD   CATARACT EXTRACTION  2005   also had a macular hole in 2005   CATARACT EXTRACTION  2008   COLONOSCOPY     2003, 2014   COLONOSCOPY WITH PROPOFOL N/A 12/26/2021   Procedure: COLONOSCOPY WITH PROPOFOL;  Surgeon: Midge Minium, MD;  Location: [facility] ENDOSCOPY;  Service: Endoscopy;  Laterality: N/A;   ESOPHAGOGASTRODUODENOSCOPY     2004, 2014   ESOPHAGOGASTRODUODENOSCOPY (EGD) WITH PROPOFOL N/A 12/26/2021   Procedure: ESOPHAGOGASTRODUODENOSCOPY (EGD) WITH PROPOFOL;  Surgeon: Midge Minium, MD;  Location: [facility] ENDOSCOPY;  Service: Endoscopy;   Laterality: N/A;   ESOPHAGOGASTRODUODENOSCOPY (EGD) WITH PROPOFOL N/A 11/15/2022   Procedure: ESOPHAGOGASTRODUODENOSCOPY (EGD) WITH PROPOFOL;  Surgeon: Regis Bill, MD;  Location: [facility] ENDOSCOPY;  Service: Endoscopy;  Laterality: N/A;   HAND SURGERY Left 06/26/2011   Malignancy removed from left hand   NASAL SEPTUM SURGERY  1986   NASAL SINUS SURGERY  1990   penile inplant  1984   polyp of rectum  2003   PROSTATE SURGERY  1975   Abdominal, had to have radiation treatment with the procedure   PROSTATE SURGERY     TONSILLECTOMY     TRANSURETHRAL RESECTION OF BLADDER TUMOR N/A 03/12/2021   Procedure: TRANSURETHRAL RESECTION OF BLADDER TUMOR (TURBT);  Surgeon: Riki Altes, MD;  Location: [facility] ORS;  Service: Urology;  Laterality: N/A;     Home Medications   Prior to Admission medications   Medication Sig Start Date End Date Taking? Authorizing Provider  acetaminophen (TYLENOL) 325 MG tablet Take 2 tablets (650 mg total) by mouth every 6 (six) hours as needed for mild pain (or Fever >/= 101). 12/09/22   Loyce Dys, MD  apixaban (ELIQUIS) 2.5 MG TABS tablet Take 1 tablet (2.5 mg total) by mouth 2 (two) times daily. Reduced dose for for prevention of blood clots, in the setting of likely GI bleed. 04/10/23   Darlin Priestly, MD  Apoaequorin (PREVAGEN) 10 MG CAPS Take 10 mg by mouth daily.    [provider]  ARTIFICIAL TEAR SOLUTION OP Place 1 drop into both eyes 2 (two) times daily as needed (dry eyes).    [provider]  brimonidine (ALPHAGAN) 0.2 % ophthalmic solution Place 1 drop into the left eye 2 (two) times daily. 01/25/21   [provider]  Calcium Carbonate-Vit D-Min (CALCIUM 1200 PO) Take 1,200 mg by mouth daily.    [provider]  cyanocobalamin (VITAMIN B12) 1000 MCG tablet Take 1 tablet (1,000 mcg total) by mouth daily. 11/18/22   Esaw Grandchild A, DO  ferrous sulfate 325 (65 FE) MG tablet Take 1 tablet (325 mg total) by mouth every other  day. 01/05/23   [name], MD  fluticasone (FLONASE) 50 MCG/ACT nasal spray Place 2 sprays into both nostrils daily as needed for allergies. 09/25/21   [name], MD  isosorbide mononitrate (IMDUR) 30 MG 24 hr tablet Take 0.5 tablets (15 mg total) by mouth daily. 05/12/23   Leeroy Bock, MD  levothyroxine (SYNTHROID) 88 MCG tablet TAKE 1 TABLET EVERY DAY ON EMPTY STOMACHWITH A GLASS OF WATER AT LEAST 30-60 MINBEFORE BREAKFAST 12/24/22   [name], MD  memantine (NAMENDA) 10 MG tablet TAKE 1 TABLET BY MOUTH TWO TIMES DAILY Patient taking differently: Take 10 mg by mouth 2 (two) times daily. 11/19/21   [name], MD  metoprolol succinate (TOPROL-XL) 25 MG 24 hr tablet Take 0.5 tablets (12.5 mg total) by  mouth daily. 05/12/23 06/11/23  Leeroy Bock, MD  MULTIPLE VITAMIN PO Take 1 tablet by mouth daily.    [provider]  NON FORMULARY CPAP (Free Text) - Historical Medication  As directed  Started 22-Sep-1994 Active 09/22/1994   [provider]  pantoprazole (PROTONIX) 40 MG tablet Take 1 tablet (40 mg total) by mouth 2 (two) times daily. 04/10/23   Darlin Priestly, MD  PARoxetine (PAXIL) 10 MG tablet Take 10 mg by mouth daily.    [provider]  pravastatin (PRAVACHOL) 40 MG tablet TAKE 1 TABLET BY MOUTH DAILY 12/24/22   [name], MD     Allergies  Levofloxacin, Povidone iodine, Sulfa antibiotics, and Benadryl [diphenhydramine]   Family History   Family History  Problem Relation Age of Onset   Lung cancer Mother    Heart disease Father      Physical Exam  Triage Vital Signs: ED Triage Vitals [06/02/23 2319]  Encounter Vitals Group     BP      Systolic BP Percentile      Diastolic BP Percentile      Pulse      Resp      Temp      Temp src      SpO2 95 %     Weight      Height      Head Circumference      Peak Flow      Pain Score      Pain Loc      Pain Education      Exclude from Growth Chart     Updated Vital  Signs: SpO2 95%    General: Awake, no distress.  CV:  Bradycardic.  Good peripheral perfusion. Resp:  Normal effort.  CTAB. Abd:  Nontender.  No distention.  Other:  Alert and oriented to self and place.  CN II-XII grossly intact.  5/5 motor strength and sensation all extremities.  MAE x 4.   ED Results / Procedures / Treatments  Labs (all labs ordered are listed, but only abnormal results are displayed) Labs Reviewed  SARS CORONAVIRUS 2 BY RT PCR  CBC WITH DIFFERENTIAL/PLATELET  LACTIC ACID, PLASMA  LACTIC ACID, PLASMA  COMPREHENSIVE METABOLIC PANEL  URINALYSIS, W/ REFLEX TO CULTURE (INFECTION SUSPECTED)  TROPONIN I (HIGH SENSITIVITY)     EKG  ED ECG REPORT I, Laurel Harnden J, the attending physician, personally viewed and interpreted this ECG.   Date: 06/02/2023  EKG Time: 2330  Rate: 51  Rhythm: sinus bradycardia  Axis: Normal  Intervals:none  ST&T Change: Nonspecific    RADIOLOGY I have independently visualized and interpreted patient's imaging studies as well as noted the radiology interpretation:  CT head:  Chest x-ray:  Official radiology report(s): No results found.   PROCEDURES:  Critical Care performed: {CriticalCareYesNo:19197::"Yes, see critical care procedure note(s)","No"}  .1-3 Lead EKG Interpretation  Performed by: Irean Hong, MD Authorized by: Irean Hong, MD     Interpretation: abnormal     ECG rate:  51   ECG rate assessment: bradycardic     Rhythm: sinus bradycardia     Ectopy: none     Conduction: normal   Comments:     Patient placed on cardiac monitor to evaluate for arrhythmias    MEDICATIONS ORDERED IN ED: Medications - No data to display   IMPRESSION / MDM / ASSESSMENT AND PLAN / ED COURSE  I reviewed the triage vital signs and the nursing notes.  89 year old male presenting with altered mental status. Differential diagnosis includes, but is not limited to, alcohol, illicit or  prescription medications, or other toxic ingestion; intracranial pathology such as stroke or intracerebral hemorrhage; fever or infectious causes including sepsis; hypoxemia and/or hypercarbia; uremia; trauma; endocrine related disorders such as diabetes, hypoglycemia, and thyroid-related diseases; hypertensive encephalopathy; etc.   Patient's presentation is most consistent with acute presentation with potential threat to life or bodily function.  The patient is on the cardiac monitor to evaluate for evidence of arrhythmia and/or significant heart rate changes.  Will obtain lab work, CT head, chest x-ray.  Will reassess.      FINAL CLINICAL IMPRESSION(S) / ED DIAGNOSES   Final diagnoses:  Altered mental status, unspecified altered mental status type     Rx / DC Orders   ED Discharge Orders     None        Note:  This document was prepared using Dragon voice recognition software and may include unintentional dictation errors.

## 2023-06-02 NOTE — ED Notes (Signed)
X-ray at bedside

## 2023-06-03 ENCOUNTER — Encounter: Payer: Self-pay | Admitting: Family Medicine

## 2023-06-03 DIAGNOSIS — I2510 Atherosclerotic heart disease of native coronary artery without angina pectoris: Secondary | ICD-10-CM | POA: Diagnosis present

## 2023-06-03 DIAGNOSIS — R410 Disorientation, unspecified: Secondary | ICD-10-CM | POA: Diagnosis not present

## 2023-06-03 DIAGNOSIS — N179 Acute kidney failure, unspecified: Secondary | ICD-10-CM | POA: Diagnosis not present

## 2023-06-03 DIAGNOSIS — E039 Hypothyroidism, unspecified: Secondary | ICD-10-CM

## 2023-06-03 DIAGNOSIS — D62 Acute posthemorrhagic anemia: Secondary | ICD-10-CM | POA: Diagnosis present

## 2023-06-03 DIAGNOSIS — Z1152 Encounter for screening for COVID-19: Secondary | ICD-10-CM | POA: Diagnosis not present

## 2023-06-03 DIAGNOSIS — N1832 Chronic kidney disease, stage 3b: Secondary | ICD-10-CM | POA: Diagnosis present

## 2023-06-03 DIAGNOSIS — G9341 Metabolic encephalopathy: Secondary | ICD-10-CM | POA: Diagnosis present

## 2023-06-03 DIAGNOSIS — R55 Syncope and collapse: Secondary | ICD-10-CM | POA: Diagnosis present

## 2023-06-03 DIAGNOSIS — Z86711 Personal history of pulmonary embolism: Secondary | ICD-10-CM | POA: Diagnosis not present

## 2023-06-03 DIAGNOSIS — F03918 Unspecified dementia, unspecified severity, with other behavioral disturbance: Secondary | ICD-10-CM | POA: Diagnosis present

## 2023-06-03 DIAGNOSIS — F0390 Unspecified dementia without behavioral disturbance: Secondary | ICD-10-CM | POA: Diagnosis not present

## 2023-06-03 DIAGNOSIS — M6281 Muscle weakness (generalized): Secondary | ICD-10-CM | POA: Diagnosis not present

## 2023-06-03 DIAGNOSIS — I129 Hypertensive chronic kidney disease with stage 1 through stage 4 chronic kidney disease, or unspecified chronic kidney disease: Secondary | ICD-10-CM | POA: Diagnosis present

## 2023-06-03 DIAGNOSIS — N189 Chronic kidney disease, unspecified: Secondary | ICD-10-CM

## 2023-06-03 DIAGNOSIS — Z79890 Hormone replacement therapy (postmenopausal): Secondary | ICD-10-CM | POA: Diagnosis not present

## 2023-06-03 DIAGNOSIS — Z87891 Personal history of nicotine dependence: Secondary | ICD-10-CM | POA: Diagnosis not present

## 2023-06-03 DIAGNOSIS — I959 Hypotension, unspecified: Secondary | ICD-10-CM | POA: Diagnosis not present

## 2023-06-03 DIAGNOSIS — F05 Delirium due to known physiological condition: Secondary | ICD-10-CM | POA: Diagnosis not present

## 2023-06-03 DIAGNOSIS — G4730 Sleep apnea, unspecified: Secondary | ICD-10-CM | POA: Diagnosis not present

## 2023-06-03 DIAGNOSIS — Z66 Do not resuscitate: Secondary | ICD-10-CM | POA: Diagnosis present

## 2023-06-03 DIAGNOSIS — Z8546 Personal history of malignant neoplasm of prostate: Secondary | ICD-10-CM | POA: Diagnosis not present

## 2023-06-03 DIAGNOSIS — K922 Gastrointestinal hemorrhage, unspecified: Secondary | ICD-10-CM | POA: Diagnosis not present

## 2023-06-03 DIAGNOSIS — E785 Hyperlipidemia, unspecified: Secondary | ICD-10-CM

## 2023-06-03 DIAGNOSIS — Z960 Presence of urogenital implants: Secondary | ICD-10-CM | POA: Diagnosis present

## 2023-06-03 DIAGNOSIS — D509 Iron deficiency anemia, unspecified: Secondary | ICD-10-CM | POA: Diagnosis not present

## 2023-06-03 DIAGNOSIS — W19XXXA Unspecified fall, initial encounter: Secondary | ICD-10-CM | POA: Diagnosis not present

## 2023-06-03 DIAGNOSIS — Z7901 Long term (current) use of anticoagulants: Secondary | ICD-10-CM | POA: Diagnosis not present

## 2023-06-03 DIAGNOSIS — I10 Essential (primary) hypertension: Secondary | ICD-10-CM | POA: Diagnosis not present

## 2023-06-03 DIAGNOSIS — I700 Atherosclerosis of aorta: Secondary | ICD-10-CM | POA: Diagnosis present

## 2023-06-03 DIAGNOSIS — F419 Anxiety disorder, unspecified: Secondary | ICD-10-CM | POA: Diagnosis not present

## 2023-06-03 DIAGNOSIS — F0394 Unspecified dementia, unspecified severity, with anxiety: Secondary | ICD-10-CM | POA: Diagnosis present

## 2023-06-03 DIAGNOSIS — Z9181 History of falling: Secondary | ICD-10-CM | POA: Diagnosis not present

## 2023-06-03 DIAGNOSIS — K921 Melena: Secondary | ICD-10-CM | POA: Diagnosis present

## 2023-06-03 DIAGNOSIS — R4182 Altered mental status, unspecified: Secondary | ICD-10-CM | POA: Diagnosis not present

## 2023-06-03 DIAGNOSIS — R531 Weakness: Secondary | ICD-10-CM | POA: Diagnosis not present

## 2023-06-03 DIAGNOSIS — Z8249 Family history of ischemic heart disease and other diseases of the circulatory system: Secondary | ICD-10-CM | POA: Diagnosis not present

## 2023-06-03 DIAGNOSIS — R2681 Unsteadiness on feet: Secondary | ICD-10-CM | POA: Diagnosis not present

## 2023-06-03 DIAGNOSIS — R0989 Other specified symptoms and signs involving the circulatory and respiratory systems: Secondary | ICD-10-CM | POA: Diagnosis not present

## 2023-06-03 DIAGNOSIS — D649 Anemia, unspecified: Secondary | ICD-10-CM | POA: Diagnosis not present

## 2023-06-03 DIAGNOSIS — R404 Transient alteration of awareness: Secondary | ICD-10-CM | POA: Diagnosis not present

## 2023-06-03 DIAGNOSIS — K219 Gastro-esophageal reflux disease without esophagitis: Secondary | ICD-10-CM | POA: Diagnosis present

## 2023-06-03 DIAGNOSIS — G4733 Obstructive sleep apnea (adult) (pediatric): Secondary | ICD-10-CM | POA: Diagnosis present

## 2023-06-03 LAB — TROPONIN I (HIGH SENSITIVITY)
Troponin I (High Sensitivity): 7 ng/L (ref <18)
Troponin I (High Sensitivity): 8 ng/L (ref <18)

## 2023-06-03 LAB — FOLATE: Folate: 30 ng/mL (ref >5.9)

## 2023-06-03 LAB — IRON AND TIBC
Iron: 64 ug/dL (ref 45–182)
Saturation Ratios: 22 % (ref 17.9–39.5)
TIBC: 298 ug/dL (ref 250–450)
UIBC: 234 ug/dL

## 2023-06-03 LAB — URINALYSIS, W/ REFLEX TO CULTURE (INFECTION SUSPECTED)
Bacteria, UA: NONE SEEN
Bilirubin Urine: NEGATIVE
Glucose, UA: NEGATIVE mg/dL
Hgb urine dipstick: NEGATIVE
Ketones, ur: NEGATIVE mg/dL
Leukocytes,Ua: NEGATIVE
Nitrite: NEGATIVE
Protein, ur: NEGATIVE mg/dL
Specific Gravity, Urine: 1.016 (ref 1.005–1.030)
pH: 5 (ref 5.0–8.0)

## 2023-06-03 LAB — CBC WITH DIFFERENTIAL/PLATELET
Abs Immature Granulocytes: 0.02 10*3/uL (ref 0.00–0.07)
Basophils Absolute: 0 10*3/uL (ref 0.0–0.1)
Basophils Relative: 0 %
Eosinophils Absolute: 0.2 10*3/uL (ref 0.0–0.5)
Eosinophils Relative: 3 %
HCT: 24.4 % — ABNORMAL LOW (ref 39.0–52.0)
Hemoglobin: 7.7 g/dL — ABNORMAL LOW (ref 13.0–17.0)
Immature Granulocytes: 0 %
Lymphocytes Relative: 28 %
Lymphs Abs: 1.4 10*3/uL (ref 0.7–4.0)
MCH: 30.9 pg (ref 26.0–34.0)
MCHC: 31.6 g/dL (ref 30.0–36.0)
MCV: 98 fL (ref 80.0–100.0)
Monocytes Absolute: 0.7 10*3/uL (ref 0.1–1.0)
Monocytes Relative: 14 %
Neutro Abs: 2.7 10*3/uL (ref 1.7–7.7)
Neutrophils Relative %: 55 %
Platelets: 199 10*3/uL (ref 150–400)
RBC: 2.49 MIL/uL — ABNORMAL LOW (ref 4.22–5.81)
RDW: 13.6 % (ref 11.5–15.5)
WBC: 5 10*3/uL (ref 4.0–10.5)
nRBC: 0 % (ref 0.0–0.2)

## 2023-06-03 LAB — SARS CORONAVIRUS 2 BY RT PCR: SARS Coronavirus 2 by RT PCR: NEGATIVE

## 2023-06-03 LAB — COMPREHENSIVE METABOLIC PANEL
ALT: 17 U/L (ref 0–44)
AST: 20 U/L (ref 15–41)
Albumin: 2.9 g/dL — ABNORMAL LOW (ref 3.5–5.0)
Alkaline Phosphatase: 61 U/L (ref 38–126)
Anion gap: 6 (ref 5–15)
BUN: 30 mg/dL — ABNORMAL HIGH (ref 8–23)
CO2: 23 mmol/L (ref 22–32)
Calcium: 7.9 mg/dL — ABNORMAL LOW (ref 8.9–10.3)
Chloride: 110 mmol/L (ref 98–111)
Creatinine, Ser: 1.67 mg/dL — ABNORMAL HIGH (ref 0.61–1.24)
GFR, Estimated: 38 mL/min — ABNORMAL LOW (ref >60)
Glucose, Bld: 140 mg/dL — ABNORMAL HIGH (ref 70–99)
Potassium: 3.8 mmol/L (ref 3.5–5.1)
Sodium: 139 mmol/L (ref 135–145)
Total Bilirubin: 0.4 mg/dL (ref 0.3–1.2)
Total Protein: 5.6 g/dL — ABNORMAL LOW (ref 6.5–8.1)

## 2023-06-03 LAB — CBC
HCT: 27 % — ABNORMAL LOW (ref 39.0–52.0)
Hemoglobin: 8.8 g/dL — ABNORMAL LOW (ref 13.0–17.0)
MCH: 31.4 pg (ref 26.0–34.0)
MCHC: 32.6 g/dL (ref 30.0–36.0)
MCV: 96.4 fL (ref 80.0–100.0)
Platelets: 202 10*3/uL (ref 150–400)
RBC: 2.8 MIL/uL — ABNORMAL LOW (ref 4.22–5.81)
RDW: 13.4 % (ref 11.5–15.5)
WBC: 5.1 10*3/uL (ref 4.0–10.5)
nRBC: 0 % (ref 0.0–0.2)

## 2023-06-03 LAB — BASIC METABOLIC PANEL
Anion gap: 7 (ref 5–15)
BUN: 31 mg/dL — ABNORMAL HIGH (ref 8–23)
CO2: 25 mmol/L (ref 22–32)
Calcium: 8.5 mg/dL — ABNORMAL LOW (ref 8.9–10.3)
Chloride: 107 mmol/L (ref 98–111)
Creatinine, Ser: 1.83 mg/dL — ABNORMAL HIGH (ref 0.61–1.24)
GFR, Estimated: 34 mL/min — ABNORMAL LOW (ref >60)
Glucose, Bld: 125 mg/dL — ABNORMAL HIGH (ref 70–99)
Potassium: 4 mmol/L (ref 3.5–5.1)
Sodium: 139 mmol/L (ref 135–145)

## 2023-06-03 LAB — HEMOGLOBIN AND HEMATOCRIT, BLOOD
HCT: 33.3 % — ABNORMAL LOW (ref 39.0–52.0)
HCT: 31.8 % — ABNORMAL LOW (ref 39.0–52.0)
HCT: 37.3 % — ABNORMAL LOW (ref 39.0–52.0)
Hemoglobin: 11 g/dL — ABNORMAL LOW (ref 13.0–17.0)
Hemoglobin: 10.4 g/dL — ABNORMAL LOW (ref 13.0–17.0)
Hemoglobin: 12 g/dL — ABNORMAL LOW (ref 13.0–17.0)

## 2023-06-03 LAB — VITAMIN B12: Vitamin B-12: 988 pg/mL — ABNORMAL HIGH (ref 180–914)

## 2023-06-03 LAB — LACTIC ACID, PLASMA
Lactic Acid, Venous: 0.8 mmol/L (ref 0.5–1.9)
Lactic Acid, Venous: 1.1 mmol/L (ref 0.5–1.9)

## 2023-06-03 LAB — PREPARE RBC (CROSSMATCH)

## 2023-06-03 LAB — FERRITIN: Ferritin: 8 ng/mL — ABNORMAL LOW (ref 24–336)

## 2023-06-03 MED ORDER — FLUTICASONE PROPIONATE 50 MCG/ACT NA SUSP: 2.0000 | Freq: Every day | NASAL | Status: DC | PRN

## 2023-06-03 MED ORDER — PANTOPRAZOLE INFUSION (NEW) - SIMPLE MED
8.0000 mg/h | INTRAVENOUS | Status: DC
  Administered 2023-06-03 (×2): 8 mg/h via INTRAVENOUS
  Filled 2023-06-03 (×2): qty 100

## 2023-06-03 MED ORDER — OYSTER SHELL CALCIUM/D3 500-5 MG-MCG PO TABS
1.0000 | ORAL_TABLET | Freq: Every day | ORAL | Status: DC
  Administered 2023-06-03 – 2023-06-04 (×2): 1 via ORAL
  Filled 2023-06-03 (×2): qty 1

## 2023-06-03 MED ORDER — FERROUS SULFATE 325 (65 FE) MG PO TABS
325.0000 mg | ORAL_TABLET | ORAL | Status: DC
  Administered 2023-06-03: 325 mg via ORAL
  Filled 2023-06-03: qty 1

## 2023-06-03 MED ORDER — BRIMONIDINE TARTRATE 0.2 % OP SOLN: 1.0000 [drp] | Freq: Two times a day (BID) | OPHTHALMIC | Status: DC

## 2023-06-03 MED ORDER — MEMANTINE HCL 10 MG PO TABS
10.0000 mg | ORAL_TABLET | Freq: Two times a day (BID) | ORAL | Status: DC
  Administered 2023-06-03 – 2023-06-04 (×3): 10 mg via ORAL
  Filled 2023-06-03: qty 2
  Filled 2023-06-03 (×2): qty 1

## 2023-06-03 MED ORDER — INFLUENZA VAC A&B SURF ANT ADJ 0.5 ML IM SUSY
0.5000 mL | PREFILLED_SYRINGE | INTRAMUSCULAR | Status: DC
  Filled 2023-06-03: qty 0.5

## 2023-06-03 MED ORDER — ISOSORBIDE MONONITRATE ER 30 MG PO TB24
15.0000 mg | ORAL_TABLET | Freq: Every day | ORAL | Status: DC
  Administered 2023-06-03 – 2023-06-04 (×2): 15 mg via ORAL
  Filled 2023-06-03 (×2): qty 1

## 2023-06-03 MED ORDER — ACETAMINOPHEN 650 MG RE SUPP: 650.0000 mg | Freq: Four times a day (QID) | RECTAL | Status: DC | PRN

## 2023-06-03 MED ORDER — APOAEQUORIN 10 MG PO CAPS: 10.0000 mg | ORAL_CAPSULE | Freq: Every day | ORAL | Status: DC

## 2023-06-03 MED ORDER — LEVOTHYROXINE SODIUM 88 MCG PO TABS
88.0000 ug | ORAL_TABLET | Freq: Every day | ORAL | Status: DC
  Administered 2023-06-03 – 2023-06-04 (×2): 88 ug via ORAL
  Filled 2023-06-03 (×2): qty 1

## 2023-06-03 MED ORDER — PAROXETINE HCL 10 MG PO TABS
10.0000 mg | ORAL_TABLET | Freq: Every day | ORAL | Status: DC
  Administered 2023-06-03 – 2023-06-04 (×2): 10 mg via ORAL
  Filled 2023-06-03 (×2): qty 1

## 2023-06-03 MED ORDER — ONDANSETRON HCL 4 MG PO TABS: 4.0000 mg | ORAL_TABLET | Freq: Four times a day (QID) | ORAL | Status: DC | PRN

## 2023-06-03 MED ORDER — VITAMIN B-12 1000 MCG PO TABS
1000.0000 ug | ORAL_TABLET | Freq: Every day | ORAL | Status: DC
  Administered 2023-06-03 – 2023-06-04 (×2): 1000 ug via ORAL
  Filled 2023-06-03: qty 1
  Filled 2023-06-03: qty 2

## 2023-06-03 MED ORDER — ONDANSETRON HCL 4 MG/2ML IJ SOLN: 4.0000 mg | Freq: Four times a day (QID) | INTRAMUSCULAR | Status: DC | PRN

## 2023-06-03 MED ORDER — ACETAMINOPHEN 325 MG PO TABS: 650.0000 mg | ORAL_TABLET | Freq: Four times a day (QID) | ORAL | Status: DC | PRN

## 2023-06-03 MED ORDER — TRAZODONE HCL 50 MG PO TABS
25.0000 mg | ORAL_TABLET | Freq: Every evening | ORAL | Status: DC | PRN
  Administered 2023-06-03: 25 mg via ORAL
  Filled 2023-06-03: qty 1

## 2023-06-03 MED ORDER — PANTOPRAZOLE 80MG IVPB - SIMPLE MED
80.0000 mg | Freq: Once | INTRAVENOUS | Status: AC
  Administered 2023-06-03: 80 mg via INTRAVENOUS
  Filled 2023-06-03: qty 100

## 2023-06-03 MED ORDER — ADULT MULTIVITAMIN W/MINERALS CH
1.0000 | ORAL_TABLET | Freq: Every day | ORAL | Status: DC
  Administered 2023-06-03 – 2023-06-04 (×2): 1 via ORAL
  Filled 2023-06-03 (×2): qty 1

## 2023-06-03 MED ORDER — METOPROLOL SUCCINATE ER 25 MG PO TB24
12.5000 mg | ORAL_TABLET | Freq: Every day | ORAL | Status: DC
  Administered 2023-06-03 – 2023-06-04 (×2): 12.5 mg via ORAL
  Filled 2023-06-03 (×2): qty 1

## 2023-06-03 MED ORDER — BRIMONIDINE TARTRATE 0.2 % OP SOLN
1.0000 [drp] | Freq: Two times a day (BID) | OPHTHALMIC | Status: DC
  Filled 2023-06-03: qty 5

## 2023-06-03 MED ORDER — PANTOPRAZOLE SODIUM 40 MG PO TBEC
40.0000 mg | DELAYED_RELEASE_TABLET | Freq: Two times a day (BID) | ORAL | Status: DC
  Administered 2023-06-03 – 2023-06-04 (×2): 40 mg via ORAL
  Filled 2023-06-03 (×2): qty 1

## 2023-06-03 MED ORDER — HYDRALAZINE HCL 20 MG/ML IJ SOLN
10.0000 mg | Freq: Four times a day (QID) | INTRAMUSCULAR | Status: DC | PRN
  Administered 2023-06-03 – 2023-06-04 (×3): 10 mg via INTRAVENOUS
  Filled 2023-06-03 (×3): qty 1

## 2023-06-03 MED ORDER — PRAVASTATIN SODIUM 40 MG PO TABS
40.0000 mg | ORAL_TABLET | Freq: Every day | ORAL | Status: DC
  Administered 2023-06-03 – 2023-06-04 (×2): 40 mg via ORAL
  Filled 2023-06-03: qty 1
  Filled 2023-06-03: qty 2

## 2023-06-03 MED ORDER — SODIUM CHLORIDE 0.9 % IV SOLN: 10.0000 mL/h | Freq: Once | INTRAVENOUS | Status: DC

## 2023-06-03 MED ORDER — SODIUM CHLORIDE 0.9 % IV SOLN: INTRAVENOUS | Status: DC

## 2023-06-03 NOTE — Consult Note (Signed)
Wyline Mood , MD [address], [place], [place], [address] [address], [address], [place], [place], [address] Phone: [phone]  Fax: [phone]  Consultation  Referring Provider:   Dr. Arville Care Primary Care Physician:  [name], MD Primary Gastroenterologist:  Dr. Tobi Bastos        Reason for Consultation:    Anemia  Date of Admission:  06/02/2023 Date of Consultation:  06/03/2023         HPI:   EZIAH BELFIELD is a 89 y.o. male who has a longstanding history of iron deficiency anemia admitted in April 2023 with severe anemia without any overt blood loss he had been taking Excedrin tablets for many years.  Hemoglobin has been low at least since 2014.  Ferritin was 4 in April 2023.  Underwent upper endoscopy and colonoscopy in April 2023.  Upper endoscopy demonstrated small hiatal hernia scattered inflammation in the duodenum and a colonoscopy was also performed on the same day that showed AVMs in the colon that were ablated with APC.  History: In July 2024 admitted again with severe anemia it was discussed in view of his dementia that conservative management would be pursued.  The patient had been on Eliquis at this time.  Family decided to avoid upper and lower endoscopy at the point of time due to his age and dementia.  In February 2020 for a repeat upper endoscopy showed no abnormalities.  On this admission has been admitted with altered mental status.  No mention of blood loss reported on admission note.  Was treated for AKI hypertension dementia and anxiety.  Unclear reason for acute confusion.  Hemoglobin on admission 8.8 g presently 11 g.  Baseline creatinine around 1.4 admitted with a creatinine of 1.8.  No significant elevation in BUN/creatinine ratio.  Urinalysis in March 2024 showed large amount of blood.  No recent B12 levels checked. Went into the patient's room while he was in the emergency area and discussed with the daughter as well as wife.  No overt blood loss  reported.  No abdominal discomfort.  No hematemesis no melena but they are aware of.  No other complaints.  Past Medical History:  Diagnosis Date   Anemia    Aortic atherosclerosis (HCC)    Bell palsy    Bilateral renal cysts    Bladder tumor    CAD (coronary artery disease)    DOE (dyspnea on exertion)    Elevated lactic acid level 11/13/2022   GERD (gastroesophageal reflux disease)    History of angina    History of penile implant    History of prostate cancer    Hyperlipidemia    Hypertension    Hypothyroidism    Lipoma    MRSA bacteremia 02/2011   Nephrolithiasis    OSA on CPAP    Pneumonia    as a teenager    Past Surgical History:  Procedure Laterality Date   APPENDECTOMY  2005   CARDIAC CATHETERIZATION N/A 08/13/1990   75% pLCx, 75% mLCx, 25% mLAD, 75% D2, 50% right renal artery; Location: [facility]; Surgeon: Eugenia Pancoast, MD   CATARACT EXTRACTION  2005   also had a macular hole in 2005   CATARACT EXTRACTION  2008   COLONOSCOPY     2003, 2014   COLONOSCOPY WITH PROPOFOL N/A 12/26/2021   Procedure: COLONOSCOPY WITH PROPOFOL;  Surgeon: Midge Minium, MD;  Location: [facility] ENDOSCOPY;  Service: Endoscopy;  Laterality: N/A;   ESOPHAGOGASTRODUODENOSCOPY  2004, 2014   ESOPHAGOGASTRODUODENOSCOPY (EGD) WITH PROPOFOL N/A 12/26/2021   Procedure: ESOPHAGOGASTRODUODENOSCOPY (EGD) WITH PROPOFOL;  Surgeon: Midge Minium, MD;  Location: [facility] ENDOSCOPY;  Service: Endoscopy;  Laterality: N/A;   ESOPHAGOGASTRODUODENOSCOPY (EGD) WITH PROPOFOL N/A 11/15/2022   Procedure: ESOPHAGOGASTRODUODENOSCOPY (EGD) WITH PROPOFOL;  Surgeon: Regis Bill, MD;  Location: [facility] ENDOSCOPY;  Service: Endoscopy;  Laterality: N/A;   HAND SURGERY Left 06/26/2011   Malignancy removed from left hand   NASAL SEPTUM SURGERY  1986   NASAL SINUS SURGERY  1990   penile inplant  1984   polyp of rectum  2003   PROSTATE SURGERY  1975   Abdominal, had to have radiation treatment with the procedure   PROSTATE  SURGERY     TONSILLECTOMY     TRANSURETHRAL RESECTION OF BLADDER TUMOR N/A 03/12/2021   Procedure: TRANSURETHRAL RESECTION OF BLADDER TUMOR (TURBT);  Surgeon: Riki Altes, MD;  Location: [facility] ORS;  Service: Urology;  Laterality: N/A;    Prior to Admission medications   Medication Sig Start Date End Date Taking? Authorizing Provider  acetaminophen (TYLENOL) 325 MG tablet Take 2 tablets (650 mg total) by mouth every 6 (six) hours as needed for mild pain (or Fever >/= 101). 12/09/22  Yes Loyce Dys, MD  apixaban (ELIQUIS) 2.5 MG TABS tablet Take 1 tablet (2.5 mg total) by mouth 2 (two) times daily. Reduced dose for for prevention of blood clots, in the setting of likely GI bleed. 04/10/23  Yes Darlin Priestly, MD  Apoaequorin (PREVAGEN) 10 MG CAPS Take 10 mg by mouth daily.   Yes [provider]  ARTIFICIAL TEAR SOLUTION OP Place 1 drop into both eyes 2 (two) times daily as needed (dry eyes).   Yes [provider]  brimonidine (ALPHAGAN) 0.2 % ophthalmic solution Place 1 drop into the left eye 2 (two) times daily. 01/25/21  Yes [provider]  Calcium Carbonate-Vit D-Min (CALCIUM 1200 PO) Take 1,200 mg by mouth daily.   Yes [provider]  cyanocobalamin (VITAMIN B12) 1000 MCG tablet Take 1 tablet (1,000 mcg total) by mouth daily. 11/18/22  Yes Esaw Grandchild A, DO  ferrous sulfate 325 (65 FE) MG tablet Take 1 tablet (325 mg total) by mouth every other day. 01/05/23  Yes [name], MD  isosorbide mononitrate (IMDUR) 30 MG 24 hr tablet Take 0.5 tablets (15 mg total) by mouth daily. 05/12/23  Yes Leeroy Bock, MD  levothyroxine (SYNTHROID) 88 MCG tablet TAKE 1 TABLET EVERY DAY ON EMPTY STOMACHWITH A GLASS OF WATER AT LEAST 30-60 MINBEFORE BREAKFAST 12/24/22  Yes [name], MD  memantine (NAMENDA) 10 MG tablet TAKE 1 TABLET BY MOUTH TWO TIMES DAILY Patient taking differently: Take 10 mg by mouth 2 (two) times daily. 11/19/21  Yes [name], MD   metoprolol succinate (TOPROL-XL) 25 MG 24 hr tablet Take 0.5 tablets (12.5 mg total) by mouth daily. 05/12/23 06/11/23 Yes Leeroy Bock, MD  MULTIPLE VITAMIN PO Take 1 tablet by mouth daily.   Yes [provider]  pantoprazole (PROTONIX) 40 MG tablet Take 1 tablet (40 mg total) by mouth 2 (two) times daily. 04/10/23  Yes Darlin Priestly, MD  PARoxetine (PAXIL) 10 MG tablet Take 10 mg by mouth daily.   Yes [provider]  pravastatin (PRAVACHOL) 40 MG tablet TAKE 1 TABLET BY MOUTH DAILY 12/24/22  Yes [name], MD  fluticasone (FLONASE) 50 MCG/ACT nasal spray Place 2 sprays into both nostrils daily as needed  for allergies. Patient not taking: Reported on 06/03/2023 09/25/21   [name], MD  NON FORMULARY CPAP (Free Text) - Historical Medication  As directed  Started 22-Sep-1994 Active 09/22/1994   [provider]    Family History  Problem Relation Age of Onset   Lung cancer Mother    Heart disease Father      Social History   Tobacco Use   Smoking status: Former    Current packs/day: 0.00    Average packs/day: 1 pack/day for 16.0 years (16.0 ttl pk-yrs)    Types: Cigarettes    Start date: 24    Quit date: 1964    Years since quitting: 60.7   Smokeless tobacco: Never  Vaping Use   Vaping status: Never Used  Substance Use Topics   Alcohol use: Not Currently    Alcohol/week: 2.0 standard drinks of alcohol    Types: 2 Glasses of wine per week   Drug use: No    Allergies as of 06/02/2023 - Unable to Assess 06/02/2023  Allergen Reaction Noted   Levofloxacin Swelling 02/26/2015   Povidone iodine Other (See Comments) 02/26/2015   Sulfa antibiotics  01/28/2018   Benadryl [diphenhydramine] Rash 08/10/2016    Review of Systems:    All systems reviewed and negative except where noted in HPI.   Physical Exam:  Vital signs in last 24 hours: Temp:  [97.3 F (36.3 C)-98.1 F (36.7 C)] 98.1 F (36.7 C) (09/11 1015) Pulse Rate:  [45-107] 74  (09/11 1015) Resp:  [16-23] 16 (09/11 1015) BP: (118-207)/(46-167) 158/55 (09/11 1015) SpO2:  [95 %-100 %] 97 % (09/11 1015)   General:   Pleasant, cooperative in NAD Head:  Normocephalic and atraumatic. Eyes:   No icterus.   Conjunctiva pink. PERRLA. Ears:  Normal auditory acuity. Neck:  Supple; no masses or thyroidomegaly Lungs: Respirations even and unlabored. Lungs clear to auscultation bilaterally.   No wheezes, crackles, or rhonchi.  Heart:  Regular rate and rhythm;  Without murmur, clicks, rubs or gallops Abdomen:  Soft, nondistended, nontender. Normal bowel sounds. No appreciable masses or hepatomegaly.  No rebound or guarding.  Neurologic:  Alert and oriented x2 able to answer simple questions.. Skin:  Intact without significant lesions or rashes.   LAB RESULTS: Recent Labs    06/02/23 2347 06/03/23 0224  WBC 5.0 5.1  HGB 7.7* 8.8*  HCT 24.4* 27.0*  PLT 199 202   BMET Recent Labs    06/02/23 2347 06/03/23 0224  NA 139 139  K 3.8 4.0  CL 110 107  CO2 23 25  GLUCOSE 140* 125*  BUN 30* 31*  CREATININE 1.67* 1.83*  CALCIUM 7.9* 8.5*   LFT Recent Labs    06/02/23 2347  PROT 5.6*  ALBUMIN 2.9*  AST 20  ALT 17  ALKPHOS 61  BILITOT 0.4   PT/INR No results for input(s): "LABPROT", "INR" in the last 72 hours.  STUDIES: DG Chest Port 1 View  Result Date: 06/03/2023 CLINICAL DATA:  Altered mental status EXAM: PORTABLE CHEST 1 VIEW COMPARISON:  05/11/2023 FINDINGS: The heart size and mediastinal contours are within normal limits. Aortic atherosclerosis. Both lungs are clear. The visualized skeletal structures are unremarkable. IMPRESSION: No active disease. Electronically Signed   By: Jasmine Pang M.D.   On: 06/03/2023 00:03   CT Head Wo Contrast  Result Date: 06/03/2023 CLINICAL DATA:  Altered not talking or responding EXAM: CT HEAD WITHOUT CONTRAST TECHNIQUE: Contiguous axial images were obtained from the base of the  skull through the vertex without  intravenous contrast. RADIATION DOSE REDUCTION: This exam was performed according to the departmental dose-optimization program which includes automated exposure control, adjustment of the mA and/or kV according to patient size and/or use of iterative reconstruction technique. COMPARISON:  CT brain 05/12/2023 FINDINGS: Brain: No acute territorial infarction, hemorrhage or intracranial mass. Atrophy and chronic small vessel ischemic changes of the white matter. Stable ventricle size Vascular: No hyperdense vessels.  Carotid vascular calcification Skull: Normal. Negative for fracture or focal lesion. Sinuses/Orbits: No acute finding. Other: None IMPRESSION: 1. No CT evidence for acute intracranial abnormality. 2. Atrophy and chronic small vessel ischemic changes of the white matter. Electronically Signed   By: Jasmine Pang M.D.   On: 06/03/2023 00:03      Impression / Plan:   KRYSTLE MAHESHWARI is a 89 y.o. y/o male with longstanding history of iron deficiency anemia prior upper endoscopy has not revealed anything significant colonoscopy showed AVMs in the colon that was ablated with APC.  Admitted with confusion unclear etiology, AKI and was found to have lower than baseline hemoglobin no overt blood loss reported.  Hemoglobin this morning is 11 g.  It is possible he has had chronic blood loss from the AVMs which could be from the colon or he may also have further AVMs in the small bowel.  CKD is a contributor for development of AVMs as well as advanced age.  It could have exacerbated the bleed as he is on a blood thinner.  In view of his age and comorbidities further evaluation would be challenging.  Plan 1.  Address altered mental status, treat AKI. 2.  Check B12 levels not been checked recently if low replace it could also help with the confusion if very low, check iron levels if low replace with IV iron 3.  In terms of further evaluation if we do proceed to do it he should be require a colonoscopy followed  by capsule study, it would be challenging for him to do a bowel prep and if negative requiring a capsule study would require him to swallow the device and if positive the next challenging aspect would be to have him undergo a lengthy procedure at [facility] or [facility] to ablate the AVMs if found in the small bowel.  This would put to him to tremendous discomfort and would potentially be high risk as well.  Another option would be to have his hemoglobin and iron levels closely monitored and replaced adequately with IV iron at the cancer center.  Looking back at epic I cannot see that he has received any IV iron recently.He was seen by Dr. Orlie Dakin in April 2024 for his pulmonary embolism but there is no mention of giving him IV iron.  I discussed all these options with the family and at present they want to pursue conservative management which would involve replacing any iron or any B12 if he is deficient in.  Obviously if he has overt profuse blood loss then we would have no option but performed endoscopic evaluation.  Discussed the plan with Dr. Myriam Forehand.    I will sign off.  Please call me if any further GI concerns or questions.  We would like to thank you for the opportunity to participate in the care of DESHUN URLACHER.   Thank you for involving me in the care of this patient.      LOS: 0 days   Wyline Mood, MD  06/03/2023, 10:51 AM

## 2023-06-03 NOTE — Progress Notes (Addendum)
Progress Note    Roger Cook  WJX:[id number] DOB: 10/12/1926  DOA: 06/02/2023 PCP: [name], MD      Brief Narrative:    Medical records reviewed and are as summarized below:  CASIN SCHOLTES is a 89 y.o. male with medical history significant for coronary artery disease, GERD, hypertension, dyslipidemia, hypothyroidism and OSA on CPAP, who was brought to the hospital because of confusion and generalized weakness.  Caregiver and his wife had gone to check on him and he was noted to be pale and confused. There was no report of rectal bleeding or vomiting blood.  He was admitted to the hospital for acute metabolic encephalopathy and acute on chronic anemia.   Assessment/Plan:   Principal Problem:   Anemia requiring transfusions Active Problems:   Essential hypertension   Acute kidney injury superimposed on chronic kidney disease (HCC)   Hypothyroidism   Dyslipidemia   Dementia with behavioral disturbance (HCC)   Anxiety   Coronary artery disease    Acute on chronic anemia, iron deficiency anemia: S/p transfusion with 1 unit of PRBCs on 06/12/2023.  Hemoglobin improved from 7.7-10.4.  Monitor H&H and transfuse as needed.  Check iron, B12 and folate levels No plan for endoscopic workup at this time.  Family declines any endoscopic workup.   No evidence of overt acute GI bleed at this time: Discontinue IV Protonix drip.  Start oral Protonix. History of GI bleed and EGD and colonoscopy in April 2023 had shown colonic AVMs s/p APC at that time.  EGD in February 2024 was unremarkable.   Acute metabolic encephalopathy on underlying dementia: Exact etiology unclear.  May be related to anemia or AKI.   AKI on CKD stage IIIb: S/p treatment with IV fluids. Patient has had a fluctuating creatinine and GFR. Repeat BMP tomorrow   CAD, aortic atherosclerosis: Eliquis has been held   History of bilateral pulmonary embolism in March 2024 (12/06/2022): Eliquis on hold.   Chart review showed that he had been evaluated by the vascular surgeon (in March 2024) who felt that IVC filter was not necessary.  The patient saw Dr. Orlie Dakin, hematologist, on 01/08/2023 for follow-up.  He recommended that patient be treated with Eliquis for 6 months completing treatment approximately on June 23, 2023 with instructions to discontinue Eliquis if there is any evidence of GI bleed.   Other comorbidities include hypertension, hypothyroidism, OSA on CPAP, history of prostate cancer  Plan of care was discussed with [name], daughter, over the phone.  We also discussed what to do with Eliquis going forward.  He is at risk of recurrent thromboembolism because of history of pulmonary embolism in March 2024.  He is also at risk for bleeding because of advanced age, dementia, chronic anemia and history of GI bleed.  [name] will think about it and make a decision.  Diet Order             Diet Heart Room service appropriate? Yes; Fluid consistency: Thin  Diet effective now                            Consultants: Gastroenterologist  Procedures: None    Medications:    [START ON 06/04/2023] brimonidine  1 drop Left Eye BID   calcium-vitamin D  1 tablet Oral Daily   cyanocobalamin  1,000 mcg Oral Daily   ferrous sulfate  325 mg Oral QODAY   [START ON 06/04/2023] influenza vaccine  adjuvanted  0.5 mL Intramuscular Tomorrow-1000   isosorbide mononitrate  15 mg Oral Daily   levothyroxine  88 mcg Oral Q0600   memantine  10 mg Oral BID   metoprolol succinate  12.5 mg Oral Daily   multivitamin with minerals  1 tablet Oral Daily   pantoprazole  40 mg Oral BID   PARoxetine  10 mg Oral Daily   pravastatin  40 mg Oral Daily   Continuous Infusions:  sodium chloride Stopped (06/03/23 0753)     Anti-infectives (From admission, onward)    None              Family Communication/Anticipated D/C date and plan/Code Status   DVT prophylaxis: SCDs Start: 06/03/23  0137     Code Status: Limited: Do not attempt resuscitation (DNR) -DNR-LIMITED -Do Not Intubate/DNI   Family Communication:  Plan discussed with [name], daughter, over the phone Disposition Plan: Plan to discharge home   Status is: Inpatient Remains inpatient appropriate because: Anemia that requires monitoring H&H       Subjective:   Interval events noted.  He is confused and unable to provide any history.  Objective:    Vitals:   06/03/23 1015 06/03/23 1052 06/03/23 1200 06/03/23 1625  BP: (!) 158/55  (!) 140/50 (!) 172/57  Pulse: 74  62 63  Resp: 16  15 16   Temp: 98.1 F (36.7 C)   97.9 F (36.6 C)  TempSrc: Oral   Oral  SpO2: 97%  100% 100%  Height:  5\' 7"  (1.702 m)     No data found.   Intake/Output Summary (Last 24 hours) at 06/03/2023 1641 Last data filed at 06/03/2023 1225 Gross per 24 hour  Intake 340.83 ml  Output --  Net 340.83 ml   There were no vitals filed for this visit.  Exam:  GEN: NAD SKIN: Warm and dry EYES: No pallor or icterus ENT: MMM CV: RRR PULM: CTA B ABD: soft, ND, NT, +BS CNS: AAO x 3, non focal EXT: No edema or tenderness        Data Reviewed:   I have personally reviewed following labs and imaging studies:  Labs: Labs show the following:   Basic Metabolic Panel: Recent Labs  Lab 06/02/23 2347 06/03/23 0224  NA 139 139  K 3.8 4.0  CL 110 107  CO2 23 25  GLUCOSE 140* 125*  BUN 30* 31*  CREATININE 1.67* 1.83*  CALCIUM 7.9* 8.5*   GFR Estimated Creatinine Clearance: 22.7 mL/min (A) (by C-G formula based on SCr of 1.83 mg/dL (H)). Liver Function Tests: Recent Labs  Lab 06/02/23 2347  AST 20  ALT 17  ALKPHOS 61  BILITOT 0.4  PROT 5.6*  ALBUMIN 2.9*   No results for input(s): "LIPASE", "AMYLASE" in the last 168 hours. No results for input(s): "AMMONIA" in the last 168 hours. Coagulation profile No results for input(s): "INR", "PROTIME" in the last 168 hours.  CBC: Recent Labs  Lab  06/02/23 2347 06/03/23 0224 06/03/23 1054 06/03/23 1400  WBC 5.0 5.1  --   --   NEUTROABS 2.7  --   --   --   HGB 7.7* 8.8* 11.0* 10.4*  HCT 24.4* 27.0* 33.3* 31.8*  MCV 98.0 96.4  --   --   PLT 199 202  --   --    Cardiac Enzymes: No results for input(s): "CKTOTAL", "CKMB", "CKMBINDEX", "TROPONINI" in the last 168 hours. BNP (last 3 results) No results for input(s): "PROBNP" in the last  8760 hours. CBG: No results for input(s): "GLUCAP" in the last 168 hours. D-Dimer: No results for input(s): "DDIMER" in the last 72 hours. Hgb A1c: No results for input(s): "HGBA1C" in the last 72 hours. Lipid Profile: No results for input(s): "CHOL", "HDL", "LDLCALC", "TRIG", "CHOLHDL", "LDLDIRECT" in the last 72 hours. Thyroid function studies: No results for input(s): "TSH", "T4TOTAL", "T3FREE", "THYROIDAB" in the last 72 hours.  Invalid input(s): "FREET3" Anemia work up: No results for input(s): "VITAMINB12", "FOLATE", "FERRITIN", "TIBC", "IRON", "RETICCTPCT" in the last 72 hours. Sepsis Labs: Recent Labs  Lab 06/02/23 2347 06/03/23 0123 06/03/23 0224  WBC 5.0  --  5.1  LATICACIDVEN 1.1 0.8  --     Microbiology Recent Results (from the past 240 hour(s))  SARS Coronavirus 2 by RT PCR (hospital order, performed in [facility] hospital lab) *cepheid single result test* Anterior Nasal Swab     Status: None   Collection Time: 06/02/23 11:47 PM   Specimen: Anterior Nasal Swab  Result Value Ref Range Status   SARS Coronavirus 2 by RT PCR NEGATIVE NEGATIVE Final    Comment: (NOTE) SARS-CoV-2 target nucleic acids are NOT DETECTED.  The SARS-CoV-2 RNA is generally detectable in upper and lower respiratory specimens during the acute phase of infection. The lowest concentration of SARS-CoV-2 viral copies this assay can detect is 250 copies / mL. A negative result does not preclude SARS-CoV-2 infection and should not be used as the sole basis for treatment or other patient management  decisions.  A negative result may occur with improper specimen collection / handling, submission of specimen other than nasopharyngeal swab, presence of viral mutation(s) within the areas targeted by this assay, and inadequate number of viral copies (<250 copies / mL). A negative result must be combined with clinical observations, patient history, and epidemiological information.  Fact Sheet for Patients:   RoadLapTop.co.za  Fact Sheet for Healthcare Providers: http://kim-miller.com/  This test is not yet approved or  cleared by the [place] FDA and has been authorized for detection and/or diagnosis of SARS-CoV-2 by FDA under an Emergency Use Authorization (EUA).  This EUA will remain in effect (meaning this test can be used) for the duration of the COVID-19 declaration under Section 564(b)(1) of the Act, 21 U.S.C. section 360bbb-3(b)(1), unless the authorization is terminated or revoked sooner.  Performed at [facility], [address] [place], [place] [address]     Procedures and diagnostic studies:  DG Chest [facility] 1 View  Result Date: 06/03/2023 CLINICAL DATA:  Altered mental status EXAM: PORTABLE CHEST 1 VIEW COMPARISON:  05/11/2023 FINDINGS: The heart size and mediastinal contours are within normal limits. Aortic atherosclerosis. Both lungs are clear. The visualized skeletal structures are unremarkable. IMPRESSION: No active disease. Electronically Signed   By: Jasmine Pang M.D.   On: 06/03/2023 00:03   CT Head Wo Contrast  Result Date: 06/03/2023 CLINICAL DATA:  Altered not talking or responding EXAM: CT HEAD WITHOUT CONTRAST TECHNIQUE: Contiguous axial images were obtained from the base of the skull through the vertex without intravenous contrast. RADIATION DOSE REDUCTION: This exam was performed according to the departmental dose-optimization program which includes automated exposure control, adjustment of the mA  and/or kV according to patient size and/or use of iterative reconstruction technique. COMPARISON:  CT brain 05/12/2023 FINDINGS: Brain: No acute territorial infarction, hemorrhage or intracranial mass. Atrophy and chronic small vessel ischemic changes of the white matter. Stable ventricle size Vascular: No hyperdense vessels.  Carotid vascular calcification Skull: Normal. Negative  for fracture or focal lesion. Sinuses/Orbits: No acute finding. Other: None IMPRESSION: 1. No CT evidence for acute intracranial abnormality. 2. Atrophy and chronic small vessel ischemic changes of the white matter. Electronically Signed   By: Jasmine Pang M.D.   On: 06/03/2023 00:03               LOS: 0 days   Laurent Cargile  [facility]   Pager on [email or web address]. If 7PM-7AM, please contact night-coverage at [email or web address]     06/03/2023, 4:41 PM

## 2023-06-03 NOTE — Assessment & Plan Note (Addendum)
-   We will continue Imdur Toprol-XL.

## 2023-06-03 NOTE — Assessment & Plan Note (Addendum)
-   The patient will be admitted to a progressive unit bed. - The patient will be hydrated with IV normal saline. - We will continue IV Protonix drip. -The patient has subsequent acute blood loss anemia. - We will follow serial hemoglobins and hematocrits.  If hemoglobin drops below 7 we will transfuse PRBCs. - We will have to hold Eliquis at this time. - The patient has been admitted in July and diagnosed with duodenitis at which time his Eliquis dose was cut down. - GI consult will be obtained. - I notified Dr. Allegra Lai about the patient.

## 2023-06-03 NOTE — Assessment & Plan Note (Signed)
-   We will continue statin therapy. 

## 2023-06-03 NOTE — Assessment & Plan Note (Signed)
-   We will continue Aricept and Namenda. 

## 2023-06-03 NOTE — ED Notes (Signed)
Informed RN bed assigned 

## 2023-06-03 NOTE — Assessment & Plan Note (Signed)
- 

## 2023-06-03 NOTE — Assessment & Plan Note (Signed)
-   We will continue Synthroid. 

## 2023-06-03 NOTE — Assessment & Plan Note (Signed)
-   We will continue Paxil. 

## 2023-06-03 NOTE — Progress Notes (Signed)
PHARMACIST - PHYSICIAN ORDER COMMUNICATION ? ?CONCERNING: P&T Medication Policy on Herbal Medications ? ?DESCRIPTION:  This patient?s order for:  Prevagen (apoaequorin)  has been noted. ? ?This product(s) is classified as an ?herbal? or natural product. ?Due to a lack of definitive safety studies or FDA approval, nonstandard manufacturing practices, plus the potential risk of unknown drug-drug interactions while on inpatient medications, the Pharmacy and Therapeutics Committee does not permit the use of ?herbal? or natural products of this type within [facility]. ?  ?ACTION TAKEN: ?The pharmacy department is unable to verify this order at this time and your patient has been informed of this safety policy. ?Please reevaluate patient?s clinical condition at discharge and address if the herbal or natural product(s) should be resumed at that time.  ?

## 2023-06-03 NOTE — H&P (Addendum)
[place]   PATIENT NAME: Roger Cook    MR#:  [id number]  DATE OF BIRTH:  [birth year]  DATE OF ADMISSION:  06/02/2023  PRIMARY CARE PHYSICIAN: [name], MD   Patient is coming from: Home  REQUESTING/REFERRING PHYSICIAN: Chiquita Loth, MD  CHIEF COMPLAINT:   Chief Complaint  Patient presents with   Altered Mental Status    HISTORY OF PRESENT ILLNESS:  Roger Cook is a 89 y.o. Caucasian male with medical history significant for coronary artery disease, GERD, hypertension, dyslipidemia, hypothyroidism and OSA on CPAP, who presented to the emergency room with acute onset of altered mental status with mild confusion and generalized weakness.  He has an overnight caregiver.  She was checking on him and asked if he needed to use the restroom.  He shook his head and looked like he was going to sleep.  His wife checked on him at that time and stated that he looked pale and he was staring into space which she admits that he often does.  She called EMS that came to his house.  It was oriented to himself which is his baseline.  No fever or chills.  No nausea or vomiting or abdominal pain.  No headache or dizziness or blurred vision.  No dysuria, urinary hematuria or flank pain.  ED Course: Upon presentation to the emergency room, BP was 118/46 with heart rate of 51 with otherwise normal vital signs.  Labs revealed creatinine of 1.67 up from 1.47 and BUN 30 compared to 18 and calcium 7.9 and albumin 2.9 and total protein 5.6 with otherwise unremarkable CMP.  Lactic acid was 1.1.  High-sensitivity troponin I was 7.  CBC showed hemoglobin of 7.7 and hematocrit 24.4 compared to 9.7 and 29.8 on 05/12/2023.  COVID-19 PCR came back negative. EKG as reviewed by me : EKG showed sinus rhythm with a rate of 51 with borderline low voltage QRS. Imaging: Head CT scan without contrast showed no evidence for acute intracranial abnormality.  It showed atrophy and chronic small vessel ischemic changes  of the white matter.  Portable chest x-ray showed no acute cardiopulmonary disease.  The patient was given 80 mg of IV Protonix followed by an IV Protonix drip.  The patient will be admitted to a progressive unit bed for further evaluation and management. PAST MEDICAL HISTORY:   Past Medical History:  Diagnosis Date   Anemia    Aortic atherosclerosis (HCC)    Bell palsy    Bilateral renal cysts    Bladder tumor    CAD (coronary artery disease)    DOE (dyspnea on exertion)    Elevated lactic acid level 11/13/2022   GERD (gastroesophageal reflux disease)    History of angina    History of penile implant    History of prostate cancer    Hyperlipidemia    Hypertension    Hypothyroidism    Lipoma    MRSA bacteremia 02/2011   Nephrolithiasis    OSA on CPAP    Pneumonia    as a teenager    PAST SURGICAL HISTORY:   Past Surgical History:  Procedure Laterality Date   APPENDECTOMY  2005   CARDIAC CATHETERIZATION N/A 08/13/1990   75% pLCx, 75% mLCx, 25% mLAD, 75% D2, 50% right renal artery; Location: [facility]; Surgeon: Eugenia Pancoast, MD   CATARACT EXTRACTION  2005   also had a macular hole in 2005   CATARACT EXTRACTION  2008   COLONOSCOPY  2003, 2014   COLONOSCOPY WITH PROPOFOL N/A 12/26/2021   Procedure: COLONOSCOPY WITH PROPOFOL;  Surgeon: Midge Minium, MD;  Location: [facility] ENDOSCOPY;  Service: Endoscopy;  Laterality: N/A;   ESOPHAGOGASTRODUODENOSCOPY     2004, 2014   ESOPHAGOGASTRODUODENOSCOPY (EGD) WITH PROPOFOL N/A 12/26/2021   Procedure: ESOPHAGOGASTRODUODENOSCOPY (EGD) WITH PROPOFOL;  Surgeon: Midge Minium, MD;  Location: [facility] ENDOSCOPY;  Service: Endoscopy;  Laterality: N/A;   ESOPHAGOGASTRODUODENOSCOPY (EGD) WITH PROPOFOL N/A 11/15/2022   Procedure: ESOPHAGOGASTRODUODENOSCOPY (EGD) WITH PROPOFOL;  Surgeon: Regis Bill, MD;  Location: [facility] ENDOSCOPY;  Service: Endoscopy;  Laterality: N/A;   HAND SURGERY Left 06/26/2011   Malignancy removed from left hand   NASAL  SEPTUM SURGERY  1986   NASAL SINUS SURGERY  1990   penile inplant  1984   polyp of rectum  2003   PROSTATE SURGERY  1975   Abdominal, had to have radiation treatment with the procedure   PROSTATE SURGERY     TONSILLECTOMY     TRANSURETHRAL RESECTION OF BLADDER TUMOR N/A 03/12/2021   Procedure: TRANSURETHRAL RESECTION OF BLADDER TUMOR (TURBT);  Surgeon: Riki Altes, MD;  Location: [facility] ORS;  Service: Urology;  Laterality: N/A;    SOCIAL HISTORY:   Social History   Tobacco Use   Smoking status: Former    Current packs/day: 0.00    Average packs/day: 1 pack/day for 16.0 years (16.0 ttl pk-yrs)    Types: Cigarettes    Start date: 53    Quit date: 1964    Years since quitting: 60.7   Smokeless tobacco: Never  Substance Use Topics   Alcohol use: Not Currently    Alcohol/week: 2.0 standard drinks of alcohol    Types: 2 Glasses of wine per week    FAMILY HISTORY:   Family History  Problem Relation Age of Onset   Lung cancer Mother    Heart disease Father     DRUG ALLERGIES:   Allergies  Allergen Reactions   Levofloxacin Swelling    lips swelling   Povidone Iodine Other (See Comments)    Unknown, can not remember   Sulfa Antibiotics     Unknown, can not remember   Benadryl [Diphenhydramine] Rash    REVIEW OF SYSTEMS:   ROS As per history of present illness. All pertinent systems were reviewed above. Constitutional, HEENT, cardiovascular, respiratory, GI, GU, musculoskeletal, neuro, psychiatric, endocrine, integumentary and hematologic systems were reviewed and are otherwise negative/unremarkable except for positive findings mentioned above in the HPI.   MEDICATIONS AT HOME:   Prior to Admission medications   Medication Sig Start Date End Date Taking? Authorizing Provider  acetaminophen (TYLENOL) 325 MG tablet Take 2 tablets (650 mg total) by mouth every 6 (six) hours as needed for mild pain (or Fever >/= 101). 12/09/22   Loyce Dys, MD  apixaban  (ELIQUIS) 2.5 MG TABS tablet Take 1 tablet (2.5 mg total) by mouth 2 (two) times daily. Reduced dose for for prevention of blood clots, in the setting of likely GI bleed. 04/10/23   Darlin Priestly, MD  Apoaequorin (PREVAGEN) 10 MG CAPS Take 10 mg by mouth daily.    [provider]  ARTIFICIAL TEAR SOLUTION OP Place 1 drop into both eyes 2 (two) times daily as needed (dry eyes).    [provider]  brimonidine (ALPHAGAN) 0.2 % ophthalmic solution Place 1 drop into the left eye 2 (two) times daily. 01/25/21   [provider]  Calcium Carbonate-Vit D-Min (CALCIUM 1200 PO) Take 1,200 mg  by mouth daily.    [provider]  cyanocobalamin (VITAMIN B12) 1000 MCG tablet Take 1 tablet (1,000 mcg total) by mouth daily. 11/18/22   Esaw Grandchild A, DO  ferrous sulfate 325 (65 FE) MG tablet Take 1 tablet (325 mg total) by mouth every other day. 01/05/23   [name], MD  fluticasone (FLONASE) 50 MCG/ACT nasal spray Place 2 sprays into both nostrils daily as needed for allergies. 09/25/21   [name], MD  isosorbide mononitrate (IMDUR) 30 MG 24 hr tablet Take 0.5 tablets (15 mg total) by mouth daily. 05/12/23   Leeroy Bock, MD  levothyroxine (SYNTHROID) 88 MCG tablet TAKE 1 TABLET EVERY DAY ON EMPTY STOMACHWITH A GLASS OF WATER AT LEAST 30-60 MINBEFORE BREAKFAST 12/24/22   [name], MD  memantine (NAMENDA) 10 MG tablet TAKE 1 TABLET BY MOUTH TWO TIMES DAILY Patient taking differently: Take 10 mg by mouth 2 (two) times daily. 11/19/21   [name], MD  metoprolol succinate (TOPROL-XL) 25 MG 24 hr tablet Take 0.5 tablets (12.5 mg total) by mouth daily. 05/12/23 06/11/23  Leeroy Bock, MD  MULTIPLE VITAMIN PO Take 1 tablet by mouth daily.    [provider]  NON FORMULARY CPAP (Free Text) - Historical Medication  As directed  Started 1-Akiel Fennell-1996 Active 09/22/1994   [provider]  pantoprazole (PROTONIX) 40 MG tablet Take 1 tablet (40 mg  total) by mouth 2 (two) times daily. 04/10/23   Darlin Priestly, MD  PARoxetine (PAXIL) 10 MG tablet Take 10 mg by mouth daily.    [provider]  pravastatin (PRAVACHOL) 40 MG tablet TAKE 1 TABLET BY MOUTH DAILY 12/24/22   [name], MD      VITAL SIGNS:  Blood pressure (!) 118/46, pulse (!) 51, temperature 97.7 F (36.5 C), resp. rate 18, SpO2 100%.  PHYSICAL EXAMINATION:  Physical Exam  GENERAL:  89 y.o.-year-old Caucasian male patient lying in the bed with no acute distress.  EYES: Pupils equal, round, reactive to light and accommodation. No scleral icterus.  Positive pallor.  Extraocular muscles intact.  HEENT: Head atraumatic, normocephalic. Oropharynx and nasopharynx clear.  NECK:  Supple, no jugular venous distention. No thyroid enlargement, no tenderness.  LUNGS: Normal breath sounds bilaterally, no wheezing, rales,rhonchi or crepitation. No use of accessory muscles of respiration.  CARDIOVASCULAR: Regular rate and rhythm, S1, S2 normal. No murmurs, rubs, or gallops.  ABDOMEN: Soft, nondistended, nontender. Bowel sounds present. No organomegaly or mass.  EXTREMITIES: No pedal edema, cyanosis, or clubbing.  NEUROLOGIC: Cranial nerves II through XII are intact. Muscle strength 5/5 in all extremities. Sensation intact. Gait not checked.  PSYCHIATRIC: The patient is alert and oriented x 3.  Normal affect and good eye contact. SKIN: No obvious rash, lesion, or ulcer.   LABORATORY PANEL:   CBC Recent Labs  Lab 06/02/23 2347  WBC 5.0  HGB 7.7*  HCT 24.4*  PLT 199   ------------------------------------------------------------------------------------------------------------------  Chemistries  Recent Labs  Lab 06/02/23 2347  NA 139  K 3.8  CL 110  CO2 23  GLUCOSE 140*  BUN 30*  CREATININE 1.67*  CALCIUM 7.9*  AST 20  ALT 17  ALKPHOS 61  BILITOT 0.4    ------------------------------------------------------------------------------------------------------------------  Cardiac Enzymes No results for input(s): "TROPONINI" in the last 168 hours. ------------------------------------------------------------------------------------------------------------------  RADIOLOGY:  DG Chest Port 1 View  Result Date: 06/03/2023 CLINICAL DATA:  Altered mental status EXAM: PORTABLE CHEST 1 VIEW COMPARISON:  05/11/2023 FINDINGS: The  heart size and mediastinal contours are within normal limits. Aortic atherosclerosis. Both lungs are clear. The visualized skeletal structures are unremarkable. IMPRESSION: No active disease. Electronically Signed   By: Jasmine Pang M.D.   On: 06/03/2023 00:03   CT Head Wo Contrast  Result Date: 06/03/2023 CLINICAL DATA:  Altered not talking or responding EXAM: CT HEAD WITHOUT CONTRAST TECHNIQUE: Contiguous axial images were obtained from the base of the skull through the vertex without intravenous contrast. RADIATION DOSE REDUCTION: This exam was performed according to the departmental dose-optimization program which includes automated exposure control, adjustment of the mA and/or kV according to patient size and/or use of iterative reconstruction technique. COMPARISON:  CT brain 05/12/2023 FINDINGS: Brain: No acute territorial infarction, hemorrhage or intracranial mass. Atrophy and chronic small vessel ischemic changes of the white matter. Stable ventricle size Vascular: No hyperdense vessels.  Carotid vascular calcification Skull: Normal. Negative for fracture or focal lesion. Sinuses/Orbits: No acute finding. Other: None IMPRESSION: 1. No CT evidence for acute intracranial abnormality. 2. Atrophy and chronic small vessel ischemic changes of the white matter. Electronically Signed   By: Jasmine Pang M.D.   On: 06/03/2023 00:03      IMPRESSION AND PLAN:  Assessment and Plan: * Acute upper GI bleeding - The patient will be  admitted to a progressive unit bed. - The patient will be hydrated with IV normal saline. - We will continue IV Protonix drip. -The patient has subsequent acute blood loss anemia. - We will follow serial hemoglobins and hematocrits.  If hemoglobin drops below 7 we will transfuse PRBCs. - We will have to hold Eliquis at this time. - The patient has been admitted in July and diagnosed with duodenitis at which time his Eliquis dose was cut down. - GI consult will be obtained. - I notified Dr. Allegra Lai about the patient.  Acute kidney injury superimposed on chronic kidney disease (HCC) - The patient be hydrated with IV normal saline. - We will follow BMPs. - We will avoid nephrotoxins.  Essential hypertension - We will continue antihypertensive therapy.  Hypothyroidism - We will continue Synthroid.  Dyslipidemia - We will continue statin therapy.  Dementia with behavioral disturbance (HCC) - We will continue Aricept and Namenda.  Coronary artery disease - We will continue Imdur Toprol-XL.  Anxiety - We will continue Paxil.   DVT prophylaxis: SCDs. Advanced Care Planning:  Code Status: full code.  Family Communication:  The plan of care was discussed in details with the patient (and family). I answered all questions. The patient agreed to proceed with the above mentioned plan. Further management will depend upon hospital course. Disposition Plan: Back to previous home environment Consults called: GI. All the records are reviewed and case discussed with ED provider.  Status is: Inpatient   At the time of the admission, it appears that the appropriate admission status for this patient is inpatient.  This is judged to be reasonable and necessary in order to provide the required intensity of service to ensure the patient's safety given the presenting symptoms, physical exam findings and initial radiographic and laboratory data in the context of comorbid conditions.  The patient  requires inpatient status due to high intensity of service, high risk of further deterioration and high frequency of surveillance required.  I certify that at the time of admission, it is my clinical judgment that the patient will require inpatient hospital care extending more than 2 midnights.  Dispo: The patient is from: Home              Anticipated d/c is to: Home              Patient currently is not medically stable to d/c.              Difficult to place patient: No  Hannah Beat M.D on 06/03/2023 at 2:26 AM  [facility]   From 7 PM-7 AM, contact night-coverage [email or web address]  CC: Primary care physician; [name], MD

## 2023-06-03 NOTE — ED Notes (Signed)
Messaged Valente David M.D. r/t pt's Bpis 201/167. Awaiting orders.

## 2023-06-03 NOTE — TOC Initial Note (Signed)
Transition of Care [facility]) - Initial/Assessment Note    Patient Details  Name: Roger Cook MRN: [id number] Date of Birth: 02-Dec-1926  Transition of Care [facility]) CM/SW Contact:    Margarito Liner, LCSW Phone Number: 06/03/2023, 10:38 AM  Clinical Narrative:  Readmission prevention screen complete. Patient only oriented to self. Daughter at bedside. CSW introduced role and explained that discharge planning would be discussed. PCP is Mila Merry, MD. Wife or daughter transport him to appointments. He uses [facility]. No issues obtaining medications. He gets his medications in a bubble pack. Patient lives home with. He has PCS services through [facility] and independent individuals. They come to the home Monday-Thursday 8:00 AM-8:00 PM. They are planning to increase hours to include Friday and Saturday as well. Patient has a RW, hospital bed, and BSC. He does not use the BSC. No further concerns. CSW encouraged patient's daughter to contact CSW as needed. CSW will continue to follow patient and his daughter for support and facilitate return home once stable. Wife or daughter will transport him home at discharge.               Expected Discharge Plan: Home/Self Care (with PCS) Barriers to Discharge: Continued Medical Work up   Patient Goals and CMS Choice            Expected Discharge Plan and Services     Post Acute Care Choice: Resumption of Svcs/PTA Provider Living arrangements for the past 2 months: Single Family Home                                      Prior Living Arrangements/Services Living arrangements for the past 2 months: Single Family Home Lives with:: Spouse Patient language and need for interpreter reviewed:: Yes Do you feel safe going back to the place where you live?: Yes      Need for Family Participation in Patient Care: Yes (Comment) Care giver support system in place?: Yes (comment) Current home services: DME, Other (comment)  (Personal care services) Criminal Activity/Legal Involvement Pertinent to Current Situation/Hospitalization: No - Comment as needed  Activities of Daily Living      Permission Sought/Granted Permission sought to share information with : Family Supports          Permission granted to share info w Relationship: Daughter     Emotional Assessment Appearance:: Appears stated age Attitude/Demeanor/Rapport: Unable to Assess Affect (typically observed): Unable to Assess Orientation: : Oriented to Self Alcohol / Substance Use: Not Applicable Psych Involvement: No (comment)  Admission diagnosis:  Acute upper GI bleeding [K92.2] Patient Active Problem List   Diagnosis Date Noted   Acute upper GI bleeding 06/03/2023   Dyslipidemia 06/03/2023   Anxiety 06/03/2023   Dementia with behavioral disturbance (HCC) 06/03/2023   Coronary artery disease 06/03/2023   Acute kidney injury superimposed on chronic kidney disease (HCC) 06/03/2023   Dehydration 05/12/2023   Orthostatic hypotension 05/12/2023   Syncope and collapse 05/12/2023   Depression 05/11/2023   Gastrointestinal hemorrhage associated with duodenitis 04/23/2023   Postural dizziness with near syncope 04/08/2023   Hospital discharge follow-up 01/29/2023   History of bradycardia 01/29/2023   History of syncope 01/29/2023   Transient hypotension 01/29/2023   AMS (altered mental status) 01/24/2023   Sinus bradycardia 01/24/2023   Bilateral pulmonary embolism (HCC) 12/06/2022   Chronic kidney disease, stage 3a (HCC) 12/06/2022   Vitamin B12  deficiency 11/17/2022   Generalized weakness 11/17/2022   Positive blood culture 11/15/2022   Chronic anemia 11/13/2022   Hyperglycemia 07/09/2022   Iron deficiency anemia due to chronic blood loss    Angiodysplasia of intestinal tract    Severe anemia 12/24/2021   Angina pectoris (HCC) 09/18/2020   Dementia (HCC) 09/18/2020   Renal hematoma, right, subsequent encounter 04/25/2018    Localized osteoporosis of spine 12/23/2017   Constipation 11/06/2016   Bell's palsy 02/26/2015   Nephrogenic adenoma of bladder 02/26/2015   Folliculitis 02/26/2015   Personal history of methicillin resistant Staphylococcus aureus 02/26/2015   Fatty tumor 02/26/2015   Syncope 02/26/2015   Fungal infection of nail 02/26/2015   H/O malignant neoplasm of prostate 02/26/2015   Melena 10/18/2012   Arteriosclerosis of coronary artery 01/05/2009   Severe obstructive sleep apnea 01/05/2009   Hypothyroidism 01/05/2009   Essential hypertension 09/22/1998   PCP:  [name], MD Pharmacy:   [facility] [place] [place] - [address] [place] [address] Phone: [phone] Fax: [phone]     Social Determinants of Health (SDOH) Social History: SDOH Screenings   Food Insecurity: No Food Insecurity (06/01/2023)  Housing: Patient Unable To Answer (06/01/2023)  Transportation Needs: No Transportation Needs (06/01/2023)  Utilities: Not At Risk (06/01/2023)  Alcohol Screen: Low Risk  (06/01/2023)  Depression (PHQ2-9): Low Risk  (06/01/2023)  Financial Resource Strain: Low Risk  (06/01/2023)  Physical Activity: Inactive (06/01/2023)  Social Connections: Socially Isolated (06/01/2023)  Stress: No Stress Concern Present (06/01/2023)  Tobacco Use: Medium Risk (06/01/2023)  Health Literacy: Adequate Health Literacy (06/01/2023)   SDOH Interventions:     Readmission Risk Interventions    06/03/2023   10:35 AM 04/09/2023   10:35 AM 01/26/2023    3:59 PM  Readmission Risk Prevention Plan  Transportation Screening Complete Complete Complete  Medication Review (RN Care Manager) Complete Complete Complete  PCP or Specialist appointment within 3-5 days of discharge Complete -- Complete  HRI or Home Care Consult Complete Complete Complete  SW Recovery Care/Counseling Consult Complete Complete Complete  Palliative Care Screening Not Applicable Not Applicable Not Applicable   Skilled Nursing Facility Not Applicable Not Applicable Not Applicable

## 2023-06-04 ENCOUNTER — Observation Stay
Admission: EM | Admit: 2023-06-04 | Discharge: 2023-06-06 | Disposition: A | Discharge status: Home Health | Payer: Medicare Other | Attending: Internal Medicine | Admitting: Internal Medicine

## 2023-06-04 ENCOUNTER — Encounter: Payer: Self-pay | Admitting: Internal Medicine

## 2023-06-04 ENCOUNTER — Observation Stay: Payer: Medicare Other

## 2023-06-04 DIAGNOSIS — R531 Weakness: Secondary | ICD-10-CM | POA: Diagnosis not present

## 2023-06-04 DIAGNOSIS — R0989 Other specified symptoms and signs involving the circulatory and respiratory systems: Secondary | ICD-10-CM | POA: Diagnosis not present

## 2023-06-04 DIAGNOSIS — R404 Transient alteration of awareness: Secondary | ICD-10-CM | POA: Diagnosis not present

## 2023-06-04 DIAGNOSIS — D649 Anemia, unspecified: Secondary | ICD-10-CM | POA: Diagnosis not present

## 2023-06-04 DIAGNOSIS — I2510 Atherosclerotic heart disease of native coronary artery without angina pectoris: Secondary | ICD-10-CM | POA: Insufficient documentation

## 2023-06-04 DIAGNOSIS — Z87891 Personal history of nicotine dependence: Secondary | ICD-10-CM | POA: Diagnosis not present

## 2023-06-04 DIAGNOSIS — E039 Hypothyroidism, unspecified: Secondary | ICD-10-CM | POA: Diagnosis present

## 2023-06-04 DIAGNOSIS — R55 Syncope and collapse: Principal | ICD-10-CM | POA: Diagnosis present

## 2023-06-04 DIAGNOSIS — I959 Hypotension, unspecified: Secondary | ICD-10-CM | POA: Diagnosis not present

## 2023-06-04 DIAGNOSIS — Z9181 History of falling: Secondary | ICD-10-CM | POA: Diagnosis not present

## 2023-06-04 DIAGNOSIS — R4182 Altered mental status, unspecified: Secondary | ICD-10-CM | POA: Diagnosis not present

## 2023-06-04 DIAGNOSIS — I129 Hypertensive chronic kidney disease with stage 1 through stage 4 chronic kidney disease, or unspecified chronic kidney disease: Secondary | ICD-10-CM | POA: Diagnosis not present

## 2023-06-04 DIAGNOSIS — M6281 Muscle weakness (generalized): Secondary | ICD-10-CM | POA: Insufficient documentation

## 2023-06-04 DIAGNOSIS — F05 Delirium due to known physiological condition: Secondary | ICD-10-CM | POA: Diagnosis not present

## 2023-06-04 DIAGNOSIS — Z7901 Long term (current) use of anticoagulants: Secondary | ICD-10-CM | POA: Diagnosis not present

## 2023-06-04 DIAGNOSIS — Z86711 Personal history of pulmonary embolism: Secondary | ICD-10-CM | POA: Diagnosis not present

## 2023-06-04 DIAGNOSIS — F0390 Unspecified dementia without behavioral disturbance: Secondary | ICD-10-CM | POA: Insufficient documentation

## 2023-06-04 DIAGNOSIS — W19XXXA Unspecified fall, initial encounter: Secondary | ICD-10-CM | POA: Diagnosis not present

## 2023-06-04 DIAGNOSIS — I10 Essential (primary) hypertension: Secondary | ICD-10-CM | POA: Diagnosis not present

## 2023-06-04 DIAGNOSIS — E785 Hyperlipidemia, unspecified: Secondary | ICD-10-CM | POA: Diagnosis not present

## 2023-06-04 DIAGNOSIS — N1832 Chronic kidney disease, stage 3b: Secondary | ICD-10-CM | POA: Diagnosis not present

## 2023-06-04 DIAGNOSIS — G4730 Sleep apnea, unspecified: Secondary | ICD-10-CM | POA: Diagnosis not present

## 2023-06-04 DIAGNOSIS — R410 Disorientation, unspecified: Secondary | ICD-10-CM

## 2023-06-04 DIAGNOSIS — N1831 Chronic kidney disease, stage 3a: Secondary | ICD-10-CM | POA: Diagnosis present

## 2023-06-04 DIAGNOSIS — R2681 Unsteadiness on feet: Secondary | ICD-10-CM | POA: Diagnosis not present

## 2023-06-04 DIAGNOSIS — F419 Anxiety disorder, unspecified: Secondary | ICD-10-CM | POA: Diagnosis not present

## 2023-06-04 LAB — CBC WITH DIFFERENTIAL/PLATELET
Abs Immature Granulocytes: 0.01 10*3/uL (ref 0.00–0.07)
Basophils Absolute: 0 10*3/uL (ref 0.0–0.1)
Basophils Relative: 1 %
Eosinophils Absolute: 0.1 10*3/uL (ref 0.0–0.5)
Eosinophils Relative: 2 %
HCT: 32 % — ABNORMAL LOW (ref 39.0–52.0)
Hemoglobin: 10.4 g/dL — ABNORMAL LOW (ref 13.0–17.0)
Immature Granulocytes: 0 %
Lymphocytes Relative: 26 %
Lymphs Abs: 2.1 10*3/uL (ref 0.7–4.0)
MCH: 31 pg (ref 26.0–34.0)
MCHC: 32.5 g/dL (ref 30.0–36.0)
MCV: 95.5 fL (ref 80.0–100.0)
Monocytes Absolute: 1 10*3/uL (ref 0.1–1.0)
Monocytes Relative: 12 %
Neutro Abs: 5.1 10*3/uL (ref 1.7–7.7)
Neutrophils Relative %: 59 %
Platelets: 278 10*3/uL (ref 150–400)
RBC: 3.35 MIL/uL — ABNORMAL LOW (ref 4.22–5.81)
RDW: 14 % (ref 11.5–15.5)
WBC: 8.3 10*3/uL (ref 4.0–10.5)
nRBC: 0 % (ref 0.0–0.2)

## 2023-06-04 LAB — COMPREHENSIVE METABOLIC PANEL
ALT: 14 U/L (ref 0–44)
AST: 22 U/L (ref 15–41)
Albumin: 3 g/dL — ABNORMAL LOW (ref 3.5–5.0)
Alkaline Phosphatase: 54 U/L (ref 38–126)
Anion gap: 11 (ref 5–15)
BUN: 22 mg/dL (ref 8–23)
CO2: 17 mmol/L — ABNORMAL LOW (ref 22–32)
Calcium: 8 mg/dL — ABNORMAL LOW (ref 8.9–10.3)
Chloride: 108 mmol/L (ref 98–111)
Creatinine, Ser: 1.52 mg/dL — ABNORMAL HIGH (ref 0.61–1.24)
GFR, Estimated: 42 mL/min — ABNORMAL LOW (ref >60)
Glucose, Bld: 124 mg/dL — ABNORMAL HIGH (ref 70–99)
Potassium: 3.8 mmol/L (ref 3.5–5.1)
Sodium: 136 mmol/L (ref 135–145)
Total Bilirubin: 0.5 mg/dL (ref 0.3–1.2)
Total Protein: 5.6 g/dL — ABNORMAL LOW (ref 6.5–8.1)

## 2023-06-04 LAB — BASIC METABOLIC PANEL
Anion gap: 10 (ref 5–15)
BUN: 21 mg/dL (ref 8–23)
CO2: 21 mmol/L — ABNORMAL LOW (ref 22–32)
Calcium: 8.7 mg/dL — ABNORMAL LOW (ref 8.9–10.3)
Chloride: 108 mmol/L (ref 98–111)
Creatinine, Ser: 1.51 mg/dL — ABNORMAL HIGH (ref 0.61–1.24)
GFR, Estimated: 43 mL/min — ABNORMAL LOW (ref >60)
Glucose, Bld: 122 mg/dL — ABNORMAL HIGH (ref 70–99)
Potassium: 3.6 mmol/L (ref 3.5–5.1)
Sodium: 139 mmol/L (ref 135–145)

## 2023-06-04 LAB — TYPE AND SCREEN
ABO/RH(D): AB POS
Antibody Screen: NEGATIVE
Unit division: 0

## 2023-06-04 LAB — CBC
HCT: 33.3 % — ABNORMAL LOW (ref 39.0–52.0)
Hemoglobin: 11.1 g/dL — ABNORMAL LOW (ref 13.0–17.0)
MCH: 30.7 pg (ref 26.0–34.0)
MCHC: 33.3 g/dL (ref 30.0–36.0)
MCV: 92 fL (ref 80.0–100.0)
Platelets: 263 10*3/uL (ref 150–400)
RBC: 3.62 MIL/uL — ABNORMAL LOW (ref 4.22–5.81)
RDW: 13.5 % (ref 11.5–15.5)
WBC: 6.8 10*3/uL (ref 4.0–10.5)
nRBC: 0 % (ref 0.0–0.2)

## 2023-06-04 LAB — BPAM RBC
Blood Product Expiration Date: 202409162359
ISSUE DATE / TIME: 202409110352
Unit Type and Rh: 600

## 2023-06-04 LAB — BLOOD GAS, VENOUS
Acid-base deficit: 2 mmol/L (ref 0.0–2.0)
Bicarbonate: 23.1 mmol/L (ref 20.0–28.0)
O2 Saturation: 64.3 %
Patient temperature: 37
pCO2, Ven: 40 mmHg — ABNORMAL LOW (ref 44–60)
pH, Ven: 7.37 (ref 7.25–7.43)
pO2, Ven: 39 mmHg (ref 32–45)

## 2023-06-04 LAB — TROPONIN I (HIGH SENSITIVITY)
Troponin I (High Sensitivity): 8 ng/L (ref <18)
Troponin I (High Sensitivity): 7 ng/L (ref <18)

## 2023-06-04 MED ORDER — QUETIAPINE FUMARATE 25 MG PO TABS: 12.5000 mg | ORAL_TABLET | Freq: Every evening | ORAL | Status: DC | PRN

## 2023-06-04 MED ORDER — ISOSORBIDE MONONITRATE ER 30 MG PO TB24
15.0000 mg | ORAL_TABLET | Freq: Every day | ORAL | Status: DC
  Administered 2023-06-05 – 2023-06-06 (×2): 15 mg via ORAL
  Filled 2023-06-04 (×2): qty 1

## 2023-06-04 MED ORDER — BRIMONIDINE TARTRATE 0.2 % OP SOLN
1.0000 [drp] | Freq: Two times a day (BID) | OPHTHALMIC | Status: DC
  Administered 2023-06-05 – 2023-06-06 (×3): 1 [drp] via OPHTHALMIC
  Filled 2023-06-04: qty 5

## 2023-06-04 MED ORDER — HALOPERIDOL LACTATE 5 MG/ML IJ SOLN
5.0000 mg | Freq: Four times a day (QID) | INTRAMUSCULAR | Status: DC | PRN
  Administered 2023-06-04: 5 mg via INTRAMUSCULAR
  Filled 2023-06-04: qty 1

## 2023-06-04 MED ORDER — APIXABAN 2.5 MG PO TABS
2.5000 mg | ORAL_TABLET | Freq: Two times a day (BID) | ORAL | Status: DC
  Administered 2023-06-05 – 2023-06-06 (×3): 2.5 mg via ORAL
  Filled 2023-06-04 (×4): qty 1

## 2023-06-04 MED ORDER — ONDANSETRON HCL 4 MG/2ML IJ SOLN: 4.0000 mg | Freq: Four times a day (QID) | INTRAMUSCULAR | Status: DC | PRN

## 2023-06-04 MED ORDER — LEVOTHYROXINE SODIUM 88 MCG PO TABS
88.0000 ug | ORAL_TABLET | Freq: Every day | ORAL | Status: DC
  Administered 2023-06-05 – 2023-06-06 (×2): 88 ug via ORAL
  Filled 2023-06-04 (×2): qty 1

## 2023-06-04 MED ORDER — PANTOPRAZOLE SODIUM 40 MG PO TBEC
40.0000 mg | DELAYED_RELEASE_TABLET | Freq: Two times a day (BID) | ORAL | Status: DC
  Administered 2023-06-05 – 2023-06-06 (×3): 40 mg via ORAL
  Filled 2023-06-04 (×3): qty 1

## 2023-06-04 MED ORDER — MEMANTINE HCL 5 MG PO TABS
10.0000 mg | ORAL_TABLET | Freq: Two times a day (BID) | ORAL | Status: DC
  Administered 2023-06-05 – 2023-06-06 (×3): 10 mg via ORAL
  Filled 2023-06-04 (×3): qty 2

## 2023-06-04 MED ORDER — VITAMIN B-12 1000 MCG PO TABS
1000.0000 ug | ORAL_TABLET | Freq: Every day | ORAL | Status: DC
  Administered 2023-06-05 – 2023-06-06 (×2): 1000 ug via ORAL
  Filled 2023-06-04 (×2): qty 1

## 2023-06-04 MED ORDER — APOAEQUORIN 10 MG PO CAPS: 10.0000 mg | ORAL_CAPSULE | Freq: Every day | ORAL | Status: DC

## 2023-06-04 MED ORDER — ACETAMINOPHEN 325 MG PO TABS: 650.0000 mg | ORAL_TABLET | Freq: Four times a day (QID) | ORAL | Status: DC | PRN

## 2023-06-04 MED ORDER — HALOPERIDOL 0.5 MG PO TABS
0.5000 mg | ORAL_TABLET | Freq: Two times a day (BID) | ORAL | Status: DC
  Filled 2023-06-04: qty 1

## 2023-06-04 MED ORDER — POLYVINYL ALCOHOL 1.4 % OP SOLN: 1.0000 [drp] | OPHTHALMIC | Status: DC | PRN

## 2023-06-04 MED ORDER — FERROUS SULFATE 325 (65 FE) MG PO TABS
325.0000 mg | ORAL_TABLET | ORAL | Status: DC
  Administered 2023-06-05: 325 mg via ORAL
  Filled 2023-06-04: qty 1

## 2023-06-04 MED ORDER — PAROXETINE HCL 10 MG PO TABS
10.0000 mg | ORAL_TABLET | Freq: Every day | ORAL | Status: DC
  Administered 2023-06-05 – 2023-06-06 (×2): 10 mg via ORAL
  Filled 2023-06-04 (×2): qty 1

## 2023-06-04 MED ORDER — SODIUM CHLORIDE 0.9 % IV BOLUS
500.0000 mL | Freq: Once | INTRAVENOUS | Status: AC
  Administered 2023-06-04: 500 mL via INTRAVENOUS

## 2023-06-04 MED ORDER — ONDANSETRON HCL 4 MG PO TABS: 4.0000 mg | ORAL_TABLET | Freq: Four times a day (QID) | ORAL | Status: DC | PRN

## 2023-06-04 MED ORDER — METOPROLOL SUCCINATE ER 25 MG PO TB24
12.5000 mg | ORAL_TABLET | Freq: Every day | ORAL | Status: DC
  Administered 2023-06-05: 12.5 mg via ORAL
  Filled 2023-06-04: qty 1

## 2023-06-04 NOTE — Assessment & Plan Note (Signed)
On Synthroid

## 2023-06-04 NOTE — Assessment & Plan Note (Signed)
On CPAP at night

## 2023-06-04 NOTE — Assessment & Plan Note (Addendum)
Mental status improved from admission.

## 2023-06-04 NOTE — Assessment & Plan Note (Signed)
On Paxil 

## 2023-06-04 NOTE — Assessment & Plan Note (Signed)
On Eliquis

## 2023-06-04 NOTE — Assessment & Plan Note (Addendum)
Creatinine down to 1.35 with a GFR of 49

## 2023-06-04 NOTE — Evaluation (Signed)
Occupational Therapy Evaluation Patient Details Name: Roger Cook MRN: [id number] DOB: 1927/08/27 Today's Date: 06/04/2023   History of Present Illness Roger Cook is a 89 y.o. male with medical history significant for coronary artery disease, GERD, hypertension, dyslipidemia, hypothyroidism and OSA on CPAP, who was brought to the hospital because of confusion and generalized weakness.  Caregiver and his wife had gone to check on him and he was noted to be pale and confused.  There was no report of rectal bleeding or vomiting blood.     He was admitted to the hospital for acute metabolic encephalopathy and acute on chronic anemia.   Clinical Impression   Patient seen for OT/PT co-eval. PTA, pt requires assist for ADLs and mobility. Pt lives with wife, daughter nearby and has personal care attendants 6 days a week 24/7. Did not attempt to transfer pt to recliner during OT eval due to RN reporting pt agitated overnight and pt's overall cognitive status. Pt alert to his name and hospital.  Bed mobility MOD I, anticipate minA for ADLs. Pt stands at bedside with 1HHA and takes lateral steps with 2HHA minA. Pt's daughter reports pt is at baseline and has continuous OT/PT services that visit the home for exercises. Message sent to care team regarding pt's CLOF. Pt has necessary DME/AE at home. No further skilled OT needs at this time, OT will complete orders and sign off.       If plan is discharge home, recommend the following: A little help with walking and/or transfers;A little help with bathing/dressing/bathroom;Assistance with cooking/housework;Direct supervision/assist for medications management;Direct supervision/assist for financial management;Assist for transportation;Help with stairs or ramp for entrance;Supervision due to cognitive status    Functional Status Assessment  Patient has not had a recent decline in their functional status  Equipment Recommendations  None recommended by OT     Recommendations for Other Services Other (comment)     Precautions / Restrictions Precautions Precautions: Fall Restrictions Weight Bearing Restrictions: No      Mobility Bed Mobility Overal bed mobility: Needs Assistance Bed Mobility: Supine to Sit     Supine to sit: Modified independent (Device/Increase time)          Transfers Overall transfer level: Needs assistance Equipment used: 2 person hand held assist Transfers: Sit to/from Stand Sit to Stand: Min assist           General transfer comment: stands from seated EOB with 1UE support      Balance Overall balance assessment: Needs assistance Sitting-balance support: Feet supported     Postural control: Posterior lean Standing balance support: Reliant on assistive device for balance, Single extremity supported, During functional activity Standing balance-Leahy Scale: Fair Standing balance comment: static standing posterior lean                           ADL either performed or assessed with clinical judgement   ADL Overall ADL's : Needs assistance/impaired Eating/Feeding: Set up   Grooming: Wash/dry hands;Wash/dry face;Bed level               Lower Body Dressing: Minimal assistance;Bed level Lower Body Dressing Details (indicate cue type and reason): dons socks prior to bed mobility with minA to thread over toe             Functional mobility during ADLs: Minimal assistance;Cueing for sequencing;Cueing for safety (HHA to laterally step at side of bed) General ADL Comments: Pt requries assist at baseline.  Anticipate transfers +2 with HHA/stand pivot, and overall minA-modA for ADLs      Pertinent Vitals/Pain Pain Assessment Pain Assessment: No/denies pain     Extremity/Trunk Assessment Upper Extremity Assessment Upper Extremity Assessment: Overall WFL for tasks assessed   Lower Extremity Assessment Lower Extremity Assessment: Overall WFL for tasks assessed        Communication Communication Communication: No apparent difficulties Cueing Techniques: Verbal cues   Cognition Arousal: Alert Behavior During Therapy: WFL for tasks assessed/performed Overall Cognitive Status: History of cognitive impairments - at baseline Area of Impairment: Following commands, Orientation, Attention, Memory, Safety/judgement, Problem solving                 Orientation Level: Disoriented to, Time, Situation Current Attention Level: Sustained Memory: Decreased short-term memory Following Commands: Follows one step commands with increased time, Follows multi-step commands inconsistently Safety/Judgement: Decreased awareness of safety, Decreased awareness of deficits     General Comments: pt knows his name and that he is in the hospital. cannot tell me his birthday, or why he is here. reports that he got married 2 weeks ago.                Home Living Family/patient expects to be discharged to:: Private residence Living Arrangements: Spouse/significant other Available Help at Discharge: Family;Available 24 hours/day;Personal care attendant Type of Home: House Home Access: Stairs to enter [employer] of Steps: 4 Entrance Stairs-Rails: Right;Left Home Layout: Two level Alternate Level Stairs-Number of Steps: flight   Bathroom Shower/Tub: Chief Strategy Officer: Standard     Home Equipment: Agricultural consultant (2 wheels);BSC/3in1;Shower seat;Hospital bed   Additional Comments: Daughter reports, pt lives on main floor where hospital bed and [facility] is placed and plans to initiate increase caregiver support for Fri and Sat      Prior Functioning/Environment Prior Level of Function : Needs assist  Cognitive Assist : Mobility (cognitive);ADLs (cognitive) Mobility (Cognitive): Intermittent cues ADLs (Cognitive): Intermittent cues Physical Assist : Mobility (physical);ADLs (physical) Mobility (physical): Transfers;Gait ADLs (physical):  IADLs;Bathing;Dressing;Toileting Mobility Comments: Information taken by combination of chart review and Pt's daughter who reports: Pt amb at household level with one person using RW, does not drive                          Co-evaluation PT/OT/SLP Co-Evaluation/Treatment: Yes Reason for Co-Treatment: Necessary to address cognition/behavior during functional activity;To address functional/ADL transfers PT goals addressed during session: Mobility/safety with mobility OT goals addressed during session: ADL's and self-care      AM-PAC OT "6 Clicks" Daily Activity     Outcome Measure Help from another person eating meals?: None Help from another person taking care of personal grooming?: A Little Help from another person toileting, which includes using toliet, bedpan, or urinal?: A Lot Help from another person bathing (including washing, rinsing, drying)?: A Lot Help from another person to put on and taking off regular upper body clothing?: A Little Help from another person to put on and taking off regular lower body clothing?: A Lot 6 Click Score: 16   End of Session Nurse Communication: Mobility status  Activity Tolerance: Patient tolerated treatment well Patient left: in bed;with bed alarm set;with call bell/phone within reach                   Time: 1610-9604 OT Time Calculation (min): 30 min Charges:  OT General Charges $OT Visit: 1 Visit OT Evaluation $OT Eval Low Complexity: 1  Low OT Treatments $Self Care/Home Management : 8-22 mins  Breeley Bischof L. Sandy Haye, OTR/L  06/04/23, 12:53 PM

## 2023-06-04 NOTE — ED Triage Notes (Signed)
Pt to ED via EMS from home with AMS with syncopal episode following a recent hospitalization for a GI bleed and Anemia. Family reports mental status has been in steady decline since hospital stay.Pt responds to voice but will not verbalize understanding.

## 2023-06-04 NOTE — ED Notes (Signed)
Pt left to floor with tech and belongings.

## 2023-06-04 NOTE — Evaluation (Signed)
Physical Therapy Evaluation Patient Details Name: Roger Cook MRN: [id number] DOB: 06/16/1927 Today's Date: 06/04/2023  History of Present Illness  Roger Cook is a 89 y.o. male with medical history significant for coronary artery disease, GERD, hypertension, dyslipidemia, hypothyroidism and OSA on CPAP, who was brought to the hospital because of confusion and generalized weakness.  Caregiver and his wife had gone to check on him and he was noted to be pale and confused.  There was no report of rectal bleeding or vomiting blood.     He was admitted to the hospital for acute metabolic encephalopathy and acute on chronic anemia.  Clinical Impression   Pt is seen by PT and OT for co-evaluation. Pt is received in bed, he is agreeable to PT. AO x2 with disorientation to time and situation secondary to dementia. Pt performs bed mobility mod I with min cuing for hand placement to use hand rails, and min A to get to EOB once seated, transfers min A x2 for safety. Pt able to perform STS with 1 HHA, although use of bed frame for increased stability, and able to perform x5-6 lat steps towards HOB min A x2 for safety. Pt's daughter reports Pt is at baseline and has continuous PT/OT services that work on promoting independence and overall strength. Communicated with care team regarding cont services of HHPT via secure chat. PT will sign off at this time due to Pt being at baseline.        If plan is discharge home, recommend the following: A little help with walking and/or transfers;A little help with bathing/dressing/bathroom;Assistance with cooking/housework;Assistance with feeding;Direct supervision/assist for medications management;Help with stairs or ramp for entrance;Assist for transportation;Supervision due to cognitive status   Can travel by private vehicle        Equipment Recommendations None recommended by PT  Recommendations for Other Services       Functional Status Assessment Patient  has not had a recent decline in their functional status     Precautions / Restrictions Precautions Precautions: Fall Restrictions Weight Bearing Restrictions: No      Mobility  Bed Mobility Overal bed mobility: Needs Assistance Bed Mobility: Supine to Sit     Supine to sit: Modified independent (Device/Increase time), Min assist     General bed mobility comments: able to perform majority of bed mobility mod I with min A for scooting EOB    Transfers Overall transfer level: Needs assistance Equipment used: 1 person hand held assist Transfers: Sit to/from Stand Sit to Stand: Min assist           General transfer comment: Able to perform x1 STS HHA and 1 UE support on bed frame min A for completion of task; noted BLE supported against bed frame for increase stability    Ambulation/Gait               General Gait Details: Not assessed at this time secondary to safety concerns  Stairs            Wheelchair Mobility     Tilt Bed    Modified Rankin (Stroke Patients Only)       Balance Overall balance assessment: Needs assistance Sitting-balance support: Feet supported Sitting balance-Leahy Scale: Good Sitting balance - Comments: Able to maintain seated EOB balance during functional activities Postural control: Posterior lean Standing balance support: During functional activity, Reliant on assistive device for balance, Single extremity supported Standing balance-Leahy Scale: Fair Standing balance comment: notable posterior lean during static  standing balance and reliance on HHA                             Pertinent Vitals/Pain Pain Assessment Pain Assessment: No/denies pain    Home Living Family/patient expects to be discharged to:: Private residence Living Arrangements: Spouse/significant other Available Help at Discharge: Family;Available 24 hours/day;Personal care attendant Type of Home: House Home Access: Stairs to  enter Entrance Stairs-Rails: Doctor, general practice of Steps: 4 Alternate Level Stairs-Number of Steps: flight Home Layout: Two level Home Equipment: Agricultural consultant (2 wheels);BSC/3in1;Shower seat;Hospital bed Additional Comments: Daughter reports, pt lives on main floor where hospital bed and [facility] is placed and plans to initiate increase caregiver support for Fri and Sat    Prior Function Prior Level of Function : Needs assist       Physical Assist : Mobility (physical);ADLs (physical) Mobility (physical): Transfers;Gait ADLs (physical): IADLs;Bathing;Dressing;Toileting Mobility Comments: Information taken by combination of chart review and Pt's daughter who reports: Pt amb at household level with one person using RW, does not drive       Extremity/Trunk Assessment   Upper Extremity Assessment Upper Extremity Assessment: Overall WFL for tasks assessed    Lower Extremity Assessment Lower Extremity Assessment: Overall WFL for tasks assessed       Communication   Communication Communication: No apparent difficulties Cueing Techniques: Verbal cues  Cognition Arousal: Alert Behavior During Therapy: WFL for tasks assessed/performed Overall Cognitive Status: History of cognitive impairments - at baseline Area of Impairment: Following commands, Safety/judgement                       Following Commands: Follows one step commands with increased time, Follows multi-step commands inconsistently Safety/Judgement: Decreased awareness of safety, Decreased awareness of deficits     General Comments: AO x2; pt knows his name and that he is in the hospital. cannot tell me his birthday, or why he is here. reports that he got married 2 weeks ago.        General Comments      Exercises Other Exercises Other Exercises: x5-6 lat steps towards [facility] HHA min A x2   Assessment/Plan    PT Assessment Patient does not need any further PT services  PT Problem List          PT Treatment Interventions      PT Goals (Current goals can be found in the Care Plan section)  Acute Rehab PT Goals Patient Stated Goal: to go home PT Goal Formulation: With patient Time For Goal Achievement: 06/04/23 Potential to Achieve Goals: Good    Frequency       Co-evaluation PT/OT/SLP Co-Evaluation/Treatment: Yes Reason for Co-Treatment: Necessary to address cognition/behavior during functional activity;To address functional/ADL transfers PT goals addressed during session: Mobility/safety with mobility         AM-PAC PT "6 Clicks" Mobility  Outcome Measure Help needed turning from your back to your side while in a flat bed without using bedrails?: None Help needed moving from lying on your back to sitting on the side of a flat bed without using bedrails?: A Little Help needed moving to and from a bed to a chair (including a wheelchair)?: A Little Help needed standing up from a chair using your arms (e.g., wheelchair or bedside chair)?: A Little Help needed to walk in hospital room?: A Lot Help needed climbing 3-5 steps with a railing? : Total 6 Click Score: 16  End of Session   Activity Tolerance: Patient tolerated treatment well Patient left: in bed;with call bell/phone within reach;with bed alarm set;with family/visitor present Nurse Communication: Mobility status PT Visit Diagnosis: Unsteadiness on feet (R26.81);Muscle weakness (generalized) (M62.81);History of falling (Z91.81)    Time: 1610-9604 PT Time Calculation (min) (ACUTE ONLY): 22 min   Charges:                 Elmon Else, SPT   Shahiem Bedwell 06/04/2023, 11:55 AM

## 2023-06-04 NOTE — TOC Transition Note (Addendum)
Transition of Care [facility]) - CM/SW Discharge Note   Patient Details  Name: Roger Cook MRN: [id number] Date of Birth: 1927/08/17  Transition of Care [facility] CM/SW Contact:  Truddie Hidden, RN Phone Number: 06/04/2023, 1:07 PM   Clinical Narrative:    Spoke with patient's wife regarding discharge. She has been advised patient is active with [facility] PT/ OT via [facility]. She was advised [facility] has been notified of discharge and will contact patient to scheduled SOC.   Jason from [facility]  TOC signing off.       Barriers to Discharge: Continued Medical Work up   Patient Goals and CMS Choice      Discharge Placement                         Discharge Plan and Services Additional resources added to the After Visit Summary for       Post Acute Care Choice: Resumption of Svcs/PTA Provider                               Social Determinants of Health (SDOH) Interventions SDOH Screenings   Food Insecurity: Patient Unable To Answer (06/03/2023)  Housing: Patient Unable To Answer (06/03/2023)  Transportation Needs: Patient Unable To Answer (06/03/2023)  Utilities: Not At Risk (06/03/2023)  Alcohol Screen: Low Risk  (06/01/2023)  Depression (PHQ2-9): Low Risk  (06/01/2023)  Financial Resource Strain: Low Risk  (06/01/2023)  Physical Activity: Inactive (06/01/2023)  Social Connections: Socially Isolated (06/01/2023)  Stress: No Stress Concern Present (06/01/2023)  Tobacco Use: Medium Risk (06/03/2023)  Health Literacy: Adequate Health Literacy (06/01/2023)     Readmission Risk Interventions    06/03/2023   10:35 AM 04/09/2023   10:35 AM 01/26/2023    3:59 PM  Readmission Risk Prevention Plan  Transportation Screening Complete Complete Complete  Medication Review (RN Care Manager) Complete Complete Complete  PCP or Specialist appointment within 3-5 days of discharge Complete -- Complete  HRI or Home Care Consult Complete Complete Complete  SW Recovery Care/Counseling Consult  Complete Complete Complete  Palliative Care Screening Not Applicable Not Applicable Not Applicable  Skilled Nursing Facility Not Applicable Not Applicable Not Applicable

## 2023-06-04 NOTE — Progress Notes (Signed)
PHARMACIST - PHYSICIAN ORDER COMMUNICATION ? ?CONCERNING: P&T Medication Policy on Herbal Medications ? ?DESCRIPTION:  This patient?s order for:  Prevagen (apoaequorin)  has been noted. ? ?This product(s) is classified as an ?herbal? or natural product. ?Due to a lack of definitive safety studies or FDA approval, nonstandard manufacturing practices, plus the potential risk of unknown drug-drug interactions while on inpatient medications, the Pharmacy and Therapeutics Committee does not permit the use of ?herbal? or natural products of this type within [facility]. ?  ?ACTION TAKEN: ?The pharmacy department is unable to verify this order at this time and your patient has been informed of this safety policy. ?Please reevaluate patient?s clinical condition at discharge and address if the herbal or natural product(s) should be resumed at that time.  ?

## 2023-06-04 NOTE — Assessment & Plan Note (Addendum)
Patient walked 80 feet with mobility specialist yesterday afternoon.  Slept last night.  No shaking movements when I saw the last 2 days.  VBG normal range.  EEG negative.  CT scan of the head negative.

## 2023-06-04 NOTE — ED Provider Notes (Signed)
[facility] Provider Note    Event Date/Time   First MD Initiated Contact with Patient 06/04/23 1607     (approximate)   History   Syncope   HPI  Roger Cook is a 89 y.o. male who presents to the emergency department today after a syncopal episode.  History is primarily obtained from family at bedside.  Patient was discharged from the hospital earlier today after a stay for anemia secondary to GI bleed.  Family states that he was so weak that he was being carried into the house after getting home.  However he passed out while he was being carried.  He was able to be helped to the ground so he did not hit his head.  Family states he does have history of syncope. Patient himself denies any pain.     Physical Exam   Triage Vital Signs: ED Triage Vitals  Encounter Vitals Group     BP 06/04/23 1529 (!) 126/45     Systolic BP Percentile --      Diastolic BP Percentile --      Pulse Rate 06/04/23 1529 65     Resp 06/04/23 1529 16     Temp 06/04/23 1529 97.9 F (36.6 C)     Temp Source 06/04/23 1529 Oral     SpO2 06/04/23 1523 95 %     Weight --      Height --      Head Circumference --      Peak Flow --      Pain Score 06/04/23 1528 0     Pain Loc --      Pain Education --      Exclude from Growth Chart --     Most recent vital signs: Vitals:   06/04/23 1523 06/04/23 1529  BP:  (!) 126/45  Pulse:  65  Resp:  16  Temp:  97.9 F (36.6 C)  SpO2: 95% 95%   General: Awake, alert. CV:  Good peripheral perfusion. Regular rate and rhythm. Resp:  Normal effort. Lungs clear. Abd:  No distention. Non tender.   ED Results / Procedures / Treatments   Labs (all labs ordered are listed, but only abnormal results are displayed) Labs Reviewed  CBC WITH DIFFERENTIAL/PLATELET - Abnormal; Notable for the following components:      Result Value   RBC 3.35 (*)    Hemoglobin 10.4 (*)    HCT 32.0 (*)    All other components within normal limits   COMPREHENSIVE METABOLIC PANEL  TROPONIN I (HIGH SENSITIVITY)     EKG  I, Phineas Semen, attending physician, personally viewed and interpreted this EKG  EKG Time: 1528 Rate: 65 Rhythm: sinus rhythm Axis: normal Intervals: qtc 454 QRS: narrow, q waves III, V1 ST changes: no st elevation Impression: abnormal ekg   RADIOLOGY None   PROCEDURES:  Critical Care performed: No   MEDICATIONS ORDERED IN ED: Medications - No data to display   IMPRESSION / MDM / ASSESSMENT AND PLAN / ED COURSE  I reviewed the triage vital signs and the nursing notes.                              Differential diagnosis includes, but is not limited to, anemia, electrolyte abnormality, dehydration  Patient's presentation is most consistent with acute presentation with potential threat to life or bodily function.   The patient is on the cardiac monitor  to evaluate for evidence of arrhythmia and/or significant heart rate changes.  Patient presented to the emergency department today because of concerns for his syncopal episode.  Was just discharged from the hospital a few hours earlier.  Initial blood work without any obvious electrolyte abnormalities.  Patient is slightly anemic however not terribly changed from discharge blood work.  Discussed with Dr. Renae Gloss with the hospitalist service who will evaluate for admission.      FINAL CLINICAL IMPRESSION(S) / ED DIAGNOSES   Final diagnoses:  Syncope, unspecified syncope type    Note:  This document was prepared using Dragon voice recognition software and may include unintentional dictation errors.    Phineas Semen, MD 06/04/23 [name]

## 2023-06-04 NOTE — H&P (Signed)
History and Physical    Patient: Roger Cook VOZ:[id number] DOB: 07-19-1927 DOA: 06/04/2023 DOS: the patient was seen and examined on 06/04/2023 PCP: [name], MD  Patient coming from: Home  Chief Complaint:  Chief Complaint  Patient presents with   Altered Mental Status   HPI: Roger Cook is a 89 y.o. male with medical history significant of dementia, chronic kidney disease, hypothyroidism, hyperlipidemia, CAD.  Patient was recently in the hospital and given a unit of blood for anemia.  With his age and dementia they did not want any invasive procedures.  He was discharged home and they could not get him in the house they had trouble getting him in the house and he passed out in the doorway.  His legs were not working well.  They called 911.  Last night in the hospital he was disoriented.  Patient was able to be awakened but went back asleep easily. Review of Systems: Review of Systems  Unable to perform ROS: Dementia    Past Medical History:  Diagnosis Date   Anemia    Aortic atherosclerosis (HCC)    Bell palsy    Bilateral renal cysts    Bladder tumor    CAD (coronary artery disease)    DOE (dyspnea on exertion)    Elevated lactic acid level 11/13/2022   GERD (gastroesophageal reflux disease)    History of angina    History of penile implant    History of prostate cancer    Hyperlipidemia    Hypertension    Hypothyroidism    Lipoma    MRSA bacteremia 02/2011   Nephrolithiasis    OSA on CPAP    Pneumonia    as a teenager   Past Surgical History:  Procedure Laterality Date   APPENDECTOMY  2005   CARDIAC CATHETERIZATION N/A 08/13/1990   75% pLCx, 75% mLCx, 25% mLAD, 75% D2, 50% right renal artery; Location: [facility]; Surgeon: Eugenia Pancoast, MD   CATARACT EXTRACTION  2005   also had a macular hole in 2005   CATARACT EXTRACTION  2008   COLONOSCOPY     2003, 2014   COLONOSCOPY WITH PROPOFOL N/A 12/26/2021   Procedure: COLONOSCOPY WITH PROPOFOL;  Surgeon: Midge Minium, MD;  Location: [facility] ENDOSCOPY;  Service: Endoscopy;  Laterality: N/A;   ESOPHAGOGASTRODUODENOSCOPY     2004, 2014   ESOPHAGOGASTRODUODENOSCOPY (EGD) WITH PROPOFOL N/A 12/26/2021   Procedure: ESOPHAGOGASTRODUODENOSCOPY (EGD) WITH PROPOFOL;  Surgeon: Midge Minium, MD;  Location: [facility] ENDOSCOPY;  Service: Endoscopy;  Laterality: N/A;   ESOPHAGOGASTRODUODENOSCOPY (EGD) WITH PROPOFOL N/A 11/15/2022   Procedure: ESOPHAGOGASTRODUODENOSCOPY (EGD) WITH PROPOFOL;  Surgeon: Regis Bill, MD;  Location: [facility] ENDOSCOPY;  Service: Endoscopy;  Laterality: N/A;   HAND SURGERY Left 06/26/2011   Malignancy removed from left hand   NASAL SEPTUM SURGERY  1986   NASAL SINUS SURGERY  1990   penile inplant  1984   polyp of rectum  2003   PROSTATE SURGERY  1975   Abdominal, had to have radiation treatment with the procedure   PROSTATE SURGERY     TONSILLECTOMY     TRANSURETHRAL RESECTION OF BLADDER TUMOR N/A 03/12/2021   Procedure: TRANSURETHRAL RESECTION OF BLADDER TUMOR (TURBT);  Surgeon: Riki Altes, MD;  Location: [facility] ORS;  Service: Urology;  Laterality: N/A;   Social History:  reports that he quit smoking about 60 years ago. His smoking use included cigarettes. He started smoking about 76 years ago. He has a 16 pack-year smoking history.  He has never used smokeless tobacco. He reports that he does not currently use alcohol after a past usage of about 2.0 standard drinks of alcohol per week. He reports that he does not use drugs.  Allergies  Allergen Reactions   Levofloxacin Swelling    lips swelling   Povidone Iodine Other (See Comments)    Unknown, can not remember   Sulfa Antibiotics     Unknown, can not remember   Benadryl [Diphenhydramine] Rash    Family History  Problem Relation Age of Onset   Lung cancer Mother    Heart disease Father     Prior to Admission medications   Medication Sig Start Date End Date Taking? Authorizing Provider  acetaminophen (TYLENOL) 325 MG tablet  Take 2 tablets (650 mg total) by mouth every 6 (six) hours as needed for mild pain (or Fever >/= 101). 12/09/22   Loyce Dys, MD  apixaban (ELIQUIS) 2.5 MG TABS tablet Take 1 tablet (2.5 mg total) by mouth 2 (two) times daily. Reduced dose for for prevention of blood clots, in the setting of likely GI bleed. 04/10/23   Darlin Priestly, MD  Apoaequorin (PREVAGEN) 10 MG CAPS Take 10 mg by mouth daily.    [provider]  ARTIFICIAL TEAR SOLUTION OP Place 1 drop into both eyes 2 (two) times daily as needed (dry eyes).    [provider]  brimonidine (ALPHAGAN) 0.2 % ophthalmic solution Place 1 drop into the left eye 2 (two) times daily. 01/25/21   [provider]  Calcium Carbonate-Vit D-Min (CALCIUM 1200 PO) Take 1,200 mg by mouth daily.    [provider]  cyanocobalamin (VITAMIN B12) 1000 MCG tablet Take 1 tablet (1,000 mcg total) by mouth daily. 11/18/22   Esaw Grandchild A, DO  ferrous sulfate 325 (65 FE) MG tablet Take 1 tablet (325 mg total) by mouth every other day. 01/05/23   [name], MD  isosorbide mononitrate (IMDUR) 30 MG 24 hr tablet Take 0.5 tablets (15 mg total) by mouth daily. 05/12/23   Leeroy Bock, MD  levothyroxine (SYNTHROID) 88 MCG tablet TAKE 1 TABLET EVERY DAY ON EMPTY STOMACHWITH A GLASS OF WATER AT LEAST 30-60 MINBEFORE BREAKFAST 12/24/22   [name], MD  memantine (NAMENDA) 10 MG tablet TAKE 1 TABLET BY MOUTH TWO TIMES DAILY Patient taking differently: Take 10 mg by mouth 2 (two) times daily. 11/19/21   [name], MD  metoprolol succinate (TOPROL-XL) 25 MG 24 hr tablet Take 0.5 tablets (12.5 mg total) by mouth daily. 05/12/23 06/11/23  Leeroy Bock, MD  MULTIPLE VITAMIN PO Take 1 tablet by mouth daily.    [provider]  NON FORMULARY CPAP (Free Text) - Historical Medication  As directed  Started 22-Sep-1994 Active 09/22/1994   [provider]  pantoprazole (PROTONIX) 40 MG tablet Take 1 tablet (40 mg  total) by mouth 2 (two) times daily. 04/10/23   Darlin Priestly, MD  PARoxetine (PAXIL) 10 MG tablet Take 10 mg by mouth daily.    [provider]  pravastatin (PRAVACHOL) 40 MG tablet TAKE 1 TABLET BY MOUTH DAILY 12/24/22   [name], MD    Physical Exam: Vitals:   06/04/23 1523 06/04/23 1529  BP:  (!) 126/45  Pulse:  65  Resp:  16  Temp:  97.9 F (36.6 C)  TempSrc:  Oral  SpO2: 95% 95%   Physical Exam HENT:     Head: Normocephalic.  Mouth/Throat:     Pharynx: No oropharyngeal exudate.  Eyes:     General: Lids are normal.     Conjunctiva/sclera: Conjunctivae normal.  Cardiovascular:     Rate and Rhythm: Normal rate and regular rhythm.     Heart sounds: Normal heart sounds, S1 normal and S2 normal.  Pulmonary:     Breath sounds: No decreased breath sounds, wheezing, rhonchi or rales.  Abdominal:     Palpations: Abdomen is soft.     Tenderness: There is no abdominal tenderness.  Musculoskeletal:     Right lower leg: No swelling.     Left lower leg: No swelling.  Skin:    General: Skin is warm.     Findings: No rash.  Neurological:     Mental Status: He is lethargic.     Comments: I was able to wake him up for a little bit and he was able to hold his arms up off the bed and pushed down and pull up with his toes.  Went back asleep.  Did have some periods where he did jerking with his body.     Data Reviewed: CT head negative for acute event. Creatinine 1.52 with a GFR of 42, hemoglobin 10.4 EKG sinus rhythm 65 bpm.  Interpreted by me Assessment and Plan: * Syncope Since patient had acute delirium last night in the hospital and when family took him home he could not get in the house.  Not sure if this was a syncopal episode or not.  Watch on telemetry.  Will get an EEG since he does have some jerking with his body.  Check a CK.  Hold pravastatin.  Check a urine analysis.  Check a venous blood gas.  Check orthostatic vital signs and physical therapy  evaluation.  Anemia requiring transfusions Current hemoglobin 10.4.  Continue to monitor.  Watch for signs of bleeding.  Acute delirium Patient has underlying dementia.  Will give Seroquel at night only if agitated.  Essential hypertension On Toprol  History of pulmonary embolism On Eliquis  Hypothyroidism On Synthroid  CKD stage 3b, GFR 30-44 ml/min (HCC) Creatinine at baseline.  Hyperlipidemia, unspecified Hold pravastatin and check a CK  Anxiety On Paxil  Sleep apnea On CPAP at night      Advance Care Planning:   Code Status: Limited: Do not attempt resuscitation (DNR) -DNR-LIMITED -Do Not Intubate/DNI    Consults: None  Family Communication: Spoke with daughter and wife at the bedside  Severity of Illness: The appropriate patient status for this patient is OBSERVATION. Observation status is judged to be reasonable and necessary in order to provide the required intensity of service to ensure the patient's safety. The patient's presenting symptoms, physical exam findings, and initial radiographic and laboratory data in the context of their medical condition is felt to place them at decreased risk for further clinical deterioration. Furthermore, it is anticipated that the patient will be medically stable for discharge from the hospital within 2 midnights of admission.   Author: Alford Highland, MD 06/04/2023 5:39 PM  For on call review [email or web address].

## 2023-06-04 NOTE — Discharge Summary (Signed)
Physician Discharge Summary   Patient: Roger Cook MRN: [id number] DOB: [birth year]  Admit date:     06/02/2023  Discharge date: 06/04/23  Discharge Physician: Lurene Shadow   PCP: [name], MD   Recommendations at discharge:   Follow-up with PCP in 1 week  Discharge Diagnoses: Principal Problem:   Anemia requiring transfusions Active Problems:   Essential hypertension   Acute kidney injury superimposed on chronic kidney disease (HCC)   Hypothyroidism   Dyslipidemia   Dementia with behavioral disturbance (HCC)   Anxiety   Coronary artery disease  Resolved Problems:   * No resolved hospital problems. *  Hospital Course:  Roger Cook is a 89 y.o. male with medical history significant for coronary artery disease, GERD, hypertension, dyslipidemia, hypothyroidism and OSA on CPAP, who was brought to the hospital because of confusion and generalized weakness.  Caregiver and his wife had gone to check on him and he was noted to be pale and confused. There was no report of rectal bleeding or vomiting blood.   He was admitted to the hospital for acute metabolic encephalopathy, AKI and acute on chronic anemia.   Assessment and Plan:   Acute on chronic anemia, iron deficiency anemia: S/p transfusion with 1 unit of PRBCs on 06/03/2023.  Posttransfusion hemoglobin is 11.1.  Hemoglobin was 7.7 on admission. Iron studies suggestive of iron deficiency anemia (iron 64, ferritin 8, saturation ratio 22).  Folate level was normal and vitamin B12 was elevated at 988. Outpatient follow-up with PCP recommended for further management. No plan for endoscopic workup at this time.  Family declines any endoscopic workup.     No evidence of overt acute GI bleed at this time: Continue Protonix.   No plan for endoscopic workup at this time.  Family declines any endoscopic workup. History of GI bleed and EGD and colonoscopy in April 2023 had shown colonic AVMs s/p APC at that time.  EGD in  February 2024 was unremarkable.     Acute metabolic encephalopathy on underlying dementia: Exact etiology unclear.  May be related to anemia or AKI.  Overnight, he had episodes of agitation/confusion but mental status is back to baseline.     AKI on CKD stage IIIb: S/p treatment with IV fluids.  Creatinine improved from 1.83-1.51.  Patient has had a fluctuating creatinine and GFR.      CAD, aortic atherosclerosis: Eliquis to be resumed at discharge per daughter's wishes.     History of bilateral pulmonary embolism in March 2024 (12/06/2022): Discussed risks and benefits of continuing Eliquis for long-term anticoagulation with [name], daughter.  She wants patient to continue with Eliquis.  She understands that patient is at increased risk of bleeding.  Patient will follow-up with his hematologist, Dr. Orlie Dakin, for further recommendations. Chart review showed that he had been evaluated by the vascular surgeon (in March 2024) who felt that IVC filter was not necessary.  The patient saw Dr. Orlie Dakin, hematologist, on 01/08/2023 for follow-up.  He recommended that patient be treated with Eliquis for 6 months completing treatment approximately on June 23, 2023 with instructions to discontinue Eliquis if there is any evidence of GI bleed.     Other comorbidities include hypertension, hypothyroidism, OSA on CPAP, history of prostate cancer    His condition has improved and he is deemed stable for discharge to home today.  He will continue with home health PT and OT at discharge.       Consultants: Gastroenterologist Procedures performed: None Disposition: Home  health Diet recommendation:  Discharge Diet Orders (From admission, onward)     Start     Ordered   06/04/23 0000  Diet - low sodium heart healthy        06/04/23 1237           Cardiac diet DISCHARGE MEDICATION: Allergies as of 06/04/2023       Reactions   Levofloxacin Swelling   lips swelling   Povidone Iodine Other (See  Comments)   Unknown, can not remember   Sulfa Antibiotics    Unknown, can not remember   Benadryl [diphenhydramine] Rash        Medication List     TAKE these medications    acetaminophen 325 MG tablet Commonly known as: TYLENOL Take 2 tablets (650 mg total) by mouth every 6 (six) hours as needed for mild pain (or Fever >/= 101).   apixaban 2.5 MG Tabs tablet Commonly known as: ELIQUIS Take 1 tablet (2.5 mg total) by mouth 2 (two) times daily. Reduced dose for for prevention of blood clots, in the setting of likely GI bleed.   ARTIFICIAL TEAR SOLUTION OP Place 1 drop into both eyes 2 (two) times daily as needed (dry eyes).   brimonidine 0.2 % ophthalmic solution Commonly known as: ALPHAGAN Place 1 drop into the left eye 2 (two) times daily.   CALCIUM 1200 PO Take 1,200 mg by mouth daily.   cyanocobalamin 1000 MCG tablet Commonly known as: VITAMIN B12 Take 1 tablet (1,000 mcg total) by mouth daily.   ferrous sulfate 325 (65 FE) MG tablet Take 1 tablet (325 mg total) by mouth every other day.   fluticasone 50 MCG/ACT nasal spray Commonly known as: FLONASE Place 2 sprays into both nostrils daily as needed for allergies.   isosorbide mononitrate 30 MG 24 hr tablet Commonly known as: IMDUR Take 0.5 tablets (15 mg total) by mouth daily.   levothyroxine 88 MCG tablet Commonly known as: SYNTHROID TAKE 1 TABLET EVERY DAY ON EMPTY STOMACHWITH A GLASS OF WATER AT LEAST 30-60 MINBEFORE BREAKFAST   memantine 10 MG tablet Commonly known as: NAMENDA TAKE 1 TABLET BY MOUTH TWO TIMES DAILY   metoprolol succinate 25 MG 24 hr tablet Commonly known as: TOPROL-XL Take 0.5 tablets (12.5 mg total) by mouth daily.   MULTIPLE VITAMIN PO Take 1 tablet by mouth daily.   NON FORMULARY CPAP (Free Text) - Historical Medication  As directed  Started 22-Sep-1994 Active   pantoprazole 40 MG tablet Commonly known as: Protonix Take 1 tablet (40 mg total) by mouth 2 (two) times daily.    PARoxetine 10 MG tablet Commonly known as: PAXIL Take 10 mg by mouth daily.   pravastatin 40 MG tablet Commonly known as: PRAVACHOL TAKE 1 TABLET BY MOUTH DAILY   Prevagen 10 MG Caps Generic drug: Apoaequorin Take 10 mg by mouth daily.        Discharge Exam:  GEN: NAD SKIN: Warm and dry EYES: No pallor or icterus ENT: MMM CV: RRR PULM: CTA B ABD: soft, ND, NT, +BS CNS: AAO x 1 (person), non focal EXT: No edema or tenderness   Condition at discharge: good  The results of significant diagnostics from this hospitalization (including imaging, microbiology, ancillary and laboratory) are listed below for reference.   Imaging Studies: DG Chest Port 1 View  Result Date: 06/03/2023 CLINICAL DATA:  Altered mental status EXAM: PORTABLE CHEST 1 VIEW COMPARISON:  05/11/2023 FINDINGS: The heart size and mediastinal contours are within normal limits.  Aortic atherosclerosis. Both lungs are clear. The visualized skeletal structures are unremarkable. IMPRESSION: No active disease. Electronically Signed   By: Jasmine Pang M.D.   On: 06/03/2023 00:03   CT Head Wo Contrast  Result Date: 06/03/2023 CLINICAL DATA:  Altered not talking or responding EXAM: CT HEAD WITHOUT CONTRAST TECHNIQUE: Contiguous axial images were obtained from the base of the skull through the vertex without intravenous contrast. RADIATION DOSE REDUCTION: This exam was performed according to the departmental dose-optimization program which includes automated exposure control, adjustment of the mA and/or kV according to patient size and/or use of iterative reconstruction technique. COMPARISON:  CT brain 05/12/2023 FINDINGS: Brain: No acute territorial infarction, hemorrhage or intracranial mass. Atrophy and chronic small vessel ischemic changes of the white matter. Stable ventricle size Vascular: No hyperdense vessels.  Carotid vascular calcification Skull: Normal. Negative for fracture or focal lesion. Sinuses/Orbits: No  acute finding. Other: None IMPRESSION: 1. No CT evidence for acute intracranial abnormality. 2. Atrophy and chronic small vessel ischemic changes of the white matter. Electronically Signed   By: Jasmine Pang M.D.   On: 06/03/2023 00:03   CT Head Wo Contrast  Result Date: 05/12/2023 CLINICAL DATA:  Mechanical fall, on blood thinners EXAM: CT HEAD WITHOUT CONTRAST CT CERVICAL SPINE WITHOUT CONTRAST TECHNIQUE: Multidetector CT imaging of the head and cervical spine was performed following the standard protocol without intravenous contrast. Multiplanar CT image reconstructions of the cervical spine were also generated. RADIATION DOSE REDUCTION: This exam was performed according to the departmental dose-optimization program which includes automated exposure control, adjustment of the mA and/or kV according to patient size and/or use of iterative reconstruction technique. COMPARISON:  04/08/2023 CT head, no prior CT cervical spine FINDINGS: CT HEAD FINDINGS Brain: No evidence of acute infarct, hemorrhage, mass, mass effect, or midline shift. No hydrocephalus or extra-axial fluid collection. Periventricular white matter changes, likely the sequela of chronic small vessel ischemic disease. Remote lacunar infarcts in the left basal ganglia. Age related cerebral atrophy. Vascular: No hyperdense vessel. Atherosclerotic calcifications in the intracranial carotid and vertebral arteries. Skull: Negative for fracture or focal lesion. Sinuses/Orbits: Mucosal thickening in the right maxillary sinus. Complete opacification of the bilateral frontal sinuses. Sequela prior endoscopic sinus surgery. Status post bilateral lens replacements. Other: The mastoid air cells are well aerated. CT CERVICAL SPINE FINDINGS Alignment: No traumatic listhesis. Trace anterolisthesis of to C4 on C5, which appears facet mediated. Skull base and vertebrae: No acute fracture or suspicious osseous lesion. Osseous fusion of C2 and C3. Unchanged  compression deformity of T3, which was present on the 12/20/2022 chest CT Soft tissues and spinal canal: No prevertebral fluid or swelling. No visible canal hematoma. Disc levels: Degenerative changes in the cervical spine. No significant spinal canal stenosis. We calcification in the spinal canal at the level of C2-C3 may be ligamentum flavum Upper chest: Negative. IMPRESSION: 1. No acute intracranial process. 2. No acute fracture or traumatic listhesis in the cervical spine. Electronically Signed   By: Wiliam Ke M.D.   On: 05/12/2023 21:12   CT Cervical Spine Wo Contrast  Result Date: 05/12/2023 CLINICAL DATA:  Mechanical fall, on blood thinners EXAM: CT HEAD WITHOUT CONTRAST CT CERVICAL SPINE WITHOUT CONTRAST TECHNIQUE: Multidetector CT imaging of the head and cervical spine was performed following the standard protocol without intravenous contrast. Multiplanar CT image reconstructions of the cervical spine were also generated. RADIATION DOSE REDUCTION: This exam was performed according to the departmental dose-optimization program which includes automated exposure control, adjustment of  the mA and/or kV according to patient size and/or use of iterative reconstruction technique. COMPARISON:  04/08/2023 CT head, no prior CT cervical spine FINDINGS: CT HEAD FINDINGS Brain: No evidence of acute infarct, hemorrhage, mass, mass effect, or midline shift. No hydrocephalus or extra-axial fluid collection. Periventricular white matter changes, likely the sequela of chronic small vessel ischemic disease. Remote lacunar infarcts in the left basal ganglia. Age related cerebral atrophy. Vascular: No hyperdense vessel. Atherosclerotic calcifications in the intracranial carotid and vertebral arteries. Skull: Negative for fracture or focal lesion. Sinuses/Orbits: Mucosal thickening in the right maxillary sinus. Complete opacification of the bilateral frontal sinuses. Sequela prior endoscopic sinus surgery. Status post  bilateral lens replacements. Other: The mastoid air cells are well aerated. CT CERVICAL SPINE FINDINGS Alignment: No traumatic listhesis. Trace anterolisthesis of to C4 on C5, which appears facet mediated. Skull base and vertebrae: No acute fracture or suspicious osseous lesion. Osseous fusion of C2 and C3. Unchanged compression deformity of T3, which was present on the 12/20/2022 chest CT Soft tissues and spinal canal: No prevertebral fluid or swelling. No visible canal hematoma. Disc levels: Degenerative changes in the cervical spine. No significant spinal canal stenosis. We calcification in the spinal canal at the level of C2-C3 may be ligamentum flavum Upper chest: Negative. IMPRESSION: 1. No acute intracranial process. 2. No acute fracture or traumatic listhesis in the cervical spine. Electronically Signed   By: Wiliam Ke M.D.   On: 05/12/2023 21:12   DG Chest Port 1 View  Result Date: 05/11/2023 CLINICAL DATA:  Syncope EXAM: PORTABLE CHEST 1 VIEW COMPARISON:  04/08/2023 FINDINGS: Heart and mediastinal contours are within normal limits. No focal opacities or effusions. No acute bony abnormality. Aortic atherosclerosis. IMPRESSION: No active disease. Electronically Signed   By: Charlett Nose M.D.   On: 05/11/2023 22:23    Microbiology: Results for orders placed or performed during the hospital encounter of 06/02/23  SARS Coronavirus 2 by RT PCR (hospital order, performed in [facility] hospital lab) *cepheid single result test* Anterior Nasal Swab     Status: None   Collection Time: 06/02/23 11:47 PM   Specimen: Anterior Nasal Swab  Result Value Ref Range Status   SARS Coronavirus 2 by RT PCR NEGATIVE NEGATIVE Final    Comment: (NOTE) SARS-CoV-2 target nucleic acids are NOT DETECTED.  The SARS-CoV-2 RNA is generally detectable in upper and lower respiratory specimens during the acute phase of infection. The lowest concentration of SARS-CoV-2 viral copies this assay can detect is 250 copies  / mL. A negative result does not preclude SARS-CoV-2 infection and should not be used as the sole basis for treatment or other patient management decisions.  A negative result may occur with improper specimen collection / handling, submission of specimen other than nasopharyngeal swab, presence of viral mutation(s) within the areas targeted by this assay, and inadequate number of viral copies (<250 copies / mL). A negative result must be combined with clinical observations, patient history, and epidemiological information.  Fact Sheet for Patients:   RoadLapTop.co.za  Fact Sheet for Healthcare Providers: http://kim-miller.com/  This test is not yet approved or  cleared by the [place] FDA and has been authorized for detection and/or diagnosis of SARS-CoV-2 by FDA under an Emergency Use Authorization (EUA).  This EUA will remain in effect (meaning this test can be used) for the duration of the COVID-19 declaration under Section 564(b)(1) of the Act, 21 U.S.C. section 360bbb-3(b)(1), unless the authorization is terminated or revoked sooner.  Performed  at [facility], [address] [place], [place] [address]     Labs: CBC: Recent Labs  Lab 06/02/23 2347 06/03/23 0224 06/03/23 1054 06/03/23 1400 06/03/23 1725 06/04/23 0442  WBC 5.0 5.1  --   --   --  6.8  NEUTROABS 2.7  --   --   --   --   --   HGB 7.7* 8.8* 11.0* 10.4* 12.0* 11.1*  HCT 24.4* 27.0* 33.3* 31.8* 37.3* 33.3*  MCV 98.0 96.4  --   --   --  92.0  PLT 199 202  --   --   --  263   Basic Metabolic Panel: Recent Labs  Lab 06/02/23 2347 06/03/23 0224 06/04/23 0442  NA 139 139 139  K 3.8 4.0 3.6  CL 110 107 108  CO2 23 25 21*  GLUCOSE 140* 125* 122*  BUN 30* 31* 21  CREATININE 1.67* 1.83* 1.51*  CALCIUM 7.9* 8.5* 8.7*   Liver Function Tests: Recent Labs  Lab 06/02/23 2347  AST 20  ALT 17  ALKPHOS 61  BILITOT 0.4  PROT 5.6*  ALBUMIN 2.9*    CBG: No results for input(s): "GLUCAP" in the last 168 hours.  Discharge time spent: greater than 30 minutes.  Signed: Lurene Shadow, MD [facility] 06/04/2023

## 2023-06-04 NOTE — Assessment & Plan Note (Addendum)
With relative bradycardia will switched Toprol over to Norvasc.

## 2023-06-04 NOTE — Assessment & Plan Note (Addendum)
Hold pravastatin.  CK normal range.

## 2023-06-04 NOTE — Assessment & Plan Note (Addendum)
Last hemoglobin 10.5.  Continue to monitor.  Watch for signs of bleeding.

## 2023-06-05 ENCOUNTER — Observation Stay: Payer: Medicare Other

## 2023-06-05 ENCOUNTER — Ambulatory Visit: Payer: Medicare Other

## 2023-06-05 DIAGNOSIS — R55 Syncope and collapse: Secondary | ICD-10-CM | POA: Diagnosis not present

## 2023-06-05 DIAGNOSIS — Z86711 Personal history of pulmonary embolism: Secondary | ICD-10-CM | POA: Diagnosis not present

## 2023-06-05 DIAGNOSIS — N1831 Chronic kidney disease, stage 3a: Secondary | ICD-10-CM

## 2023-06-05 DIAGNOSIS — I10 Essential (primary) hypertension: Secondary | ICD-10-CM | POA: Diagnosis not present

## 2023-06-05 DIAGNOSIS — I672 Cerebral atherosclerosis: Secondary | ICD-10-CM | POA: Diagnosis not present

## 2023-06-05 DIAGNOSIS — D649 Anemia, unspecified: Secondary | ICD-10-CM | POA: Diagnosis not present

## 2023-06-05 DIAGNOSIS — R410 Disorientation, unspecified: Secondary | ICD-10-CM | POA: Diagnosis not present

## 2023-06-05 DIAGNOSIS — N1832 Chronic kidney disease, stage 3b: Secondary | ICD-10-CM | POA: Diagnosis not present

## 2023-06-05 DIAGNOSIS — E785 Hyperlipidemia, unspecified: Secondary | ICD-10-CM | POA: Diagnosis not present

## 2023-06-05 DIAGNOSIS — E039 Hypothyroidism, unspecified: Secondary | ICD-10-CM | POA: Diagnosis not present

## 2023-06-05 DIAGNOSIS — G4730 Sleep apnea, unspecified: Secondary | ICD-10-CM | POA: Diagnosis not present

## 2023-06-05 DIAGNOSIS — R4182 Altered mental status, unspecified: Secondary | ICD-10-CM | POA: Diagnosis not present

## 2023-06-05 DIAGNOSIS — S0990XA Unspecified injury of head, initial encounter: Secondary | ICD-10-CM | POA: Diagnosis not present

## 2023-06-05 LAB — CBC
HCT: 31.8 % — ABNORMAL LOW (ref 39.0–52.0)
Hemoglobin: 10.5 g/dL — ABNORMAL LOW (ref 13.0–17.0)
MCH: 31 pg (ref 26.0–34.0)
MCHC: 33 g/dL (ref 30.0–36.0)
MCV: 93.8 fL (ref 80.0–100.0)
Platelets: 212 K/uL (ref 150–400)
RBC: 3.39 MIL/uL — ABNORMAL LOW (ref 4.22–5.81)
RDW: 13.5 % (ref 11.5–15.5)
WBC: 5.8 K/uL (ref 4.0–10.5)
nRBC: 0 % (ref 0.0–0.2)

## 2023-06-05 LAB — COMPREHENSIVE METABOLIC PANEL WITH GFR
ALT: 15 U/L (ref 0–44)
AST: 18 U/L (ref 15–41)
Albumin: 3 g/dL — ABNORMAL LOW (ref 3.5–5.0)
Alkaline Phosphatase: 58 U/L (ref 38–126)
Anion gap: 8 (ref 5–15)
BUN: 25 mg/dL — ABNORMAL HIGH (ref 8–23)
CO2: 24 mmol/L (ref 22–32)
Calcium: 8.5 mg/dL — ABNORMAL LOW (ref 8.9–10.3)
Chloride: 108 mmol/L (ref 98–111)
Creatinine, Ser: 1.35 mg/dL — ABNORMAL HIGH (ref 0.61–1.24)
GFR, Estimated: 49 mL/min — ABNORMAL LOW
Glucose, Bld: 113 mg/dL — ABNORMAL HIGH (ref 70–99)
Potassium: 3.9 mmol/L (ref 3.5–5.1)
Sodium: 140 mmol/L (ref 135–145)
Total Bilirubin: 0.8 mg/dL (ref 0.3–1.2)
Total Protein: 5.6 g/dL — ABNORMAL LOW (ref 6.5–8.1)

## 2023-06-05 LAB — URINALYSIS, W/ REFLEX TO CULTURE (INFECTION SUSPECTED)
Bacteria, UA: NONE SEEN
Bilirubin Urine: NEGATIVE
Glucose, UA: NEGATIVE mg/dL
Hgb urine dipstick: NEGATIVE
Ketones, ur: NEGATIVE mg/dL
Leukocytes,Ua: NEGATIVE
Nitrite: NEGATIVE
Protein, ur: NEGATIVE mg/dL
Specific Gravity, Urine: 1.01 (ref 1.005–1.030)
WBC, UA: 0 WBC/hpf (ref 0–5)
pH: 6 (ref 5.0–8.0)

## 2023-06-05 LAB — CK: Total CK: 83 U/L (ref 49–397)

## 2023-06-05 MED ORDER — HYDRALAZINE HCL 20 MG/ML IJ SOLN
10.0000 mg | Freq: Once | INTRAMUSCULAR | Status: AC
  Administered 2023-06-05: 10 mg via INTRAVENOUS
  Filled 2023-06-05: qty 1

## 2023-06-05 MED ORDER — AMLODIPINE BESYLATE 5 MG PO TABS
5.0000 mg | ORAL_TABLET | Freq: Every day | ORAL | Status: DC
  Administered 2023-06-05 – 2023-06-06 (×2): 5 mg via ORAL
  Filled 2023-06-05 (×2): qty 1

## 2023-06-05 NOTE — Progress Notes (Signed)
Eeg done 

## 2023-06-05 NOTE — Hospital Course (Signed)
89 y.o. male with medical history significant of dementia, chronic kidney disease, hypothyroidism, hyperlipidemia, CAD.  Patient was recently in the hospital and given a unit of blood for anemia.  With his age and dementia they did not want any invasive procedures.  He was discharged home and they could not get him in the house they had trouble getting him in the house and he passed out in the doorway.  His legs were not working well.  They called 911.  Last night in the hospital he was disoriented.  Patient was able to be awakened but went back asleep easily.   9/13.  Patient was able to sit up and work with physical therapy was able to follow commands.  Mental status improved today from yesterday.  EEG still pending read.  CT scan of the head ordered.

## 2023-06-05 NOTE — Progress Notes (Signed)
Mobility Specialist - Progress Note   06/05/23 1542  Mobility  Activity Ambulated with assistance in hallway  Level of Assistance Standby assist, set-up cues, supervision of patient - no hands on  Assistive Device Front wheel walker  Distance Ambulated (ft) 80 ft  Activity Response Tolerated well  $Mobility charge 1 Mobility  Mobility Specialist Start Time (ACUTE ONLY) 1521  Mobility Specialist Stop Time (ACUTE ONLY) 1540  Mobility Specialist Time Calculation (min) (ACUTE ONLY) 19 min   Pt supine upon entry, utilizing RA. Pt agreeable to OOB in the hallway this date. Pt completed bed mob ModI-- extra time required to bring RLE EOB. Pt STS to RW MinA, slightly flexed trunk upon standing. Pt amb 80 ft in the hallway MinG-SBA with chair follow, requiring min VC's for position within the RW. Pt amb with slow short steps with little step through. Pt returned to the room, left supine with alarm set and needs within reach.  Zetta Bills Mobility Specialist 06/05/23 3:55 PM

## 2023-06-05 NOTE — TOC Progression Note (Addendum)
Transition of Care [facility]) - Progression Note    Patient Details  Name: Roger Cook MRN: [id number] Date of Birth: Apr 14, 1927  [facility] CM/SW Contact  Liliana Cline, LCSW Phone Number: 06/05/2023, 1:45 PM  Clinical Narrative:    Patient was discharged home with home health 9/12. Per Barbara Cower with [facility], patient is active with [facility].  Met with patient and spouse at bedside at 3:40 pm. They confirm plan to return home with continued [facility] services and deny additional needs at home.        Expected Discharge Plan and Services                                               Social Determinants of Health (SDOH) Interventions SDOH Screenings   Food Insecurity: Patient Unable To Answer (06/03/2023)  Housing: Patient Unable To Answer (06/03/2023)  Transportation Needs: Patient Unable To Answer (06/03/2023)  Utilities: Not At Risk (06/03/2023)  Alcohol Screen: Low Risk  (06/01/2023)  Depression (PHQ2-9): Low Risk  (06/01/2023)  Financial Resource Strain: Low Risk  (06/01/2023)  Physical Activity: Inactive (06/01/2023)  Social Connections: Socially Isolated (06/01/2023)  Stress: No Stress Concern Present (06/01/2023)  Tobacco Use: Medium Risk (06/04/2023)  Health Literacy: Adequate Health Literacy (06/01/2023)    Readmission Risk Interventions    06/03/2023   10:35 AM 04/09/2023   10:35 AM 01/26/2023    3:59 PM  Readmission Risk Prevention Plan  Transportation Screening Complete Complete Complete  Medication Review (RN Care Manager) Complete Complete Complete  PCP or Specialist appointment within 3-5 days of discharge Complete -- Complete  HRI or Home Care Consult Complete Complete Complete  SW Recovery Care/Counseling Consult Complete Complete Complete  Palliative Care Screening Not Applicable Not Applicable Not Applicable  Skilled Nursing Facility Not Applicable Not Applicable Not Applicable

## 2023-06-05 NOTE — Progress Notes (Signed)
CROSS COVER NOTE  NAME: Roger Cook MRN: [id number] DOB : 04/15/1927    Concern as stated by nurse / staff   211- Severe, in with syncope, his BP this morning is 189/66 hr 62. He has a h/o HTN, takes metoprolol and imdur which is scheduled for 1000. Do you want to give him anything prn?      Pertinent findings on chart review:   Assessment and  Interventions   Assessment: Vitals:   06/05/23 0417 06/05/23 0541  BP: (!) 184/77 (!) 189/66  Pulse: (!) 55 62  Resp: 18   Temp: 97.8 F (36.6 C)   SpO2: 97%     Plan: Hydralazine 10 mg iv x 1 dose Hold metoprolol for heart rate 60 or less       Donnie Mesa NP [facility] Cross Cover 7pm-7am - check amion for availability Pager [phone]

## 2023-06-05 NOTE — Progress Notes (Signed)
Progress Note   Patient: Roger Cook VHQ:[id number] DOB: 11/17/1926 DOA: 06/04/2023     0 DOS: the patient was seen and examined on 06/05/2023   Brief hospital course:  89 y.o. male with medical history significant of dementia, chronic kidney disease, hypothyroidism, hyperlipidemia, CAD.  Patient was recently in the hospital and given a unit of blood for anemia.  With his age and dementia they did not want any invasive procedures.  He was discharged home and they could not get him in the house they had trouble getting him in the house and he passed out in the doorway.  His legs were not working well.  They called 911.  Last night in the hospital he was disoriented.  Patient was able to be awakened but went back asleep easily.   9/13.  Patient was able to sit up and work with physical therapy was able to follow commands.  Mental status improved today from yesterday.  EEG still pending read.  CT scan of the head ordered.  Assessment and Plan: * Syncope Patient did better today with physical therapy than the day before.  Walked 20 feet.  Felt okay.  Slept last night.  No shaking movements when I saw him today.  VBG normal range.  EEG done but read still pending.  CT scan of the head ordered.  Anemia requiring transfusions Current hemoglobin 10.5.  Continue to monitor.  Watch for signs of bleeding.  Acute delirium Mental status better today than yesterday.  As needed Seroquel at night.  Essential hypertension With relative bradycardia will switch Toprol over to Norvasc.  History of pulmonary embolism On Eliquis  Hypothyroidism On Synthroid  CKD stage 3b, GFR 30-44 ml/min (HCC) Creatinine down to 1.35 with a GFR of 49  Hyperlipidemia, unspecified Hold pravastatin.  CK normal range.  Anxiety On Paxil  Sleep apnea On CPAP at night        Subjective: Patient was able to sit up with physical therapy and move his arms and legs to command.  Feels okay.  Stated he slept last  night.  Walked 20 feet with physical therapy.  No shaking movements.  Physical Exam: Vitals:   06/04/23 2348 06/05/23 0417 06/05/23 0541 06/05/23 0734  BP: (!) 166/50 (!) 184/77 (!) 189/66 (!) 168/56  Pulse: 61 (!) 55 62 75  Resp:  18  18  Temp: 98.4 F (36.9 C) 97.8 F (36.6 C)  98.3 F (36.8 C)  TempSrc: Oral Oral  Oral  SpO2: 97% 97%  97%  Weight:      Height:       Physical Exam HENT:     Head: Normocephalic.     Mouth/Throat:     Pharynx: No oropharyngeal exudate.  Eyes:     General: Lids are normal.     Conjunctiva/sclera: Conjunctivae normal.  Cardiovascular:     Rate and Rhythm: Normal rate and regular rhythm.     Heart sounds: Normal heart sounds, S1 normal and S2 normal.  Pulmonary:     Breath sounds: No decreased breath sounds, wheezing, rhonchi or rales.  Abdominal:     Palpations: Abdomen is soft.     Tenderness: There is no abdominal tenderness.  Musculoskeletal:     Right lower leg: No swelling.     Left lower leg: No swelling.  Skin:    General: Skin is warm.     Findings: No rash.  Neurological:     Mental Status: He is alert.  Comments: Able to answer few questions.  Able to follow some simple commands     Data Reviewed: Creatinine 1.35, white blood cell count 5.8, hemoglobin 10.5, platelet count 212  Family Communication: Spoke with patient's daughter on the phone  Disposition: Status is: Observation Patient's daughter would like me to observe overnight again.  Concerned that he just went home and came right back.  Will get a CT scan of the head.  EEG pending read.  Planned Discharge Destination: Home with Home Health    Time spent: 28 minutes  Author: Alford Highland, MD 06/05/2023 3:38 PM  For on call review [email or web address].

## 2023-06-05 NOTE — Plan of Care (Signed)

## 2023-06-05 NOTE — Evaluation (Signed)
Physical Therapy Evaluation Patient Details Name: Roger Cook MRN: [id number] DOB: [birth year] Today's Date: 06/05/2023  History of Present Illness  89 y/o male presented to ED on 06/04/23 for AMS and syncope. D/c'ed on same day for GI bleed and anemia after 2 day stay. PMH: CAD, HTN, dementia, OSA on CPAP, CKD  Clinical Impression  Patient admitted with the above. PTA, patient lives with family who provide assistance with mobility with RW. Patient presents with weakness, impaired balance, and decreased activity tolerance. A&Ox 2 during session. Required minA for bed mobility and sit to stand. Ambulated 20' with RW and CGA with very slow gait speed. No LOB noted during mobility. No family present during session. MD notified of current mobility status compared to previous date. Patient will benefit from skilled PT services during acute stay to address listed deficits. Patient will benefit from ongoing therapy at discharge to maximize functional independence and safety.         If plan is discharge home, recommend the following: A little help with walking and/or transfers;A little help with bathing/dressing/bathroom;Assistance with cooking/housework;Direct supervision/assist for financial management;Direct supervision/assist for medications management;Help with stairs or ramp for entrance;Assist for transportation;Supervision due to cognitive status   Can travel by private vehicle        Equipment Recommendations None recommended by PT  Recommendations for Other Services       Functional Status Assessment Patient has had a recent decline in their functional status and demonstrates the ability to make significant improvements in function in a reasonable and predictable amount of time.     Precautions / Restrictions Precautions Precautions: Fall Restrictions Weight Bearing Restrictions: No      Mobility  Bed Mobility Overal bed mobility: Needs Assistance Bed Mobility: Supine to Sit      Supine to sit: Min assist     General bed mobility comments: assist to scoot towards EOB and slight trunk elevation    Transfers Overall transfer level: Needs assistance Equipment used: Rolling Claribel Sachs (2 wheels) Transfers: Sit to/from Stand Sit to Stand: Min assist                Ambulation/Gait Ambulation/Gait assistance: Contact guard assist Gait Distance (Feet): 20 Feet Assistive device: Rolling Idelia Caudell (2 wheels) Gait Pattern/deviations: Decreased stride length, Step-to pattern Gait velocity: decreased     General Gait Details: CGA for safety. No LOB noted. Very slow gait and easily distracted during mobility  Stairs            Wheelchair Mobility     Tilt Bed    Modified Rankin (Stroke Patients Only)       Balance Overall balance assessment: Needs assistance Sitting-balance support: Feet supported Sitting balance-Leahy Scale: Good     Standing balance support: Bilateral upper extremity supported, Reliant on assistive device for balance Standing balance-Leahy Scale: Poor                               Pertinent Vitals/Pain Pain Assessment Pain Assessment: No/denies pain    Home Living Family/patient expects to be discharged to:: Private residence Living Arrangements: Spouse/significant other Available Help at Discharge: Family;Available 24 hours/day;Personal care attendant Type of Home: House Home Access: Stairs to enter Entrance Stairs-Rails: Right;Left Entrance Stairs-Number of Steps: 4 Alternate Level Stairs-Number of Steps: flight Home Layout: Two level Home Equipment: Agricultural consultant (2 wheels);BSC/3in1;Shower seat;Hospital bed Additional Comments: Daughter reports, pt lives on main floor where hospital bed and [facility]  is placed and plans to initiate increase caregiver support for Fri and Sat    Prior Function Prior Level of Function : Needs assist  Cognitive Assist : Mobility (cognitive);ADLs (cognitive) Mobility  (Cognitive): Intermittent cues ADLs (Cognitive): Intermittent cues Physical Assist : Mobility (physical);ADLs (physical) Mobility (physical): Transfers;Gait ADLs (physical): IADLs;Bathing;Dressing;Toileting Mobility Comments: Information taken by combination of chart review and Pt's daughter who reports: Pt amb at household level with one person using RW, does not drive       Extremity/Trunk Assessment   Upper Extremity Assessment Upper Extremity Assessment: Generalized weakness    Lower Extremity Assessment Lower Extremity Assessment: Generalized weakness    Cervical / Trunk Assessment Cervical / Trunk Assessment: Kyphotic  Communication   Communication Communication: No apparent difficulties  Cognition Arousal: Alert Behavior During Therapy: WFL for tasks assessed/performed Overall Cognitive Status: History of cognitive impairments - at baseline                                 General Comments: AO x2; pt knows his name and that he is in the hospital. cannot tell me his birthday, or why he is here.        General Comments      Exercises     Assessment/Plan    PT Assessment Patient needs continued PT services  PT Problem List Decreased strength;Decreased activity tolerance;Decreased balance;Decreased mobility;Decreased cognition;Decreased knowledge of use of DME;Decreased safety awareness;Decreased knowledge of precautions       PT Treatment Interventions DME instruction;Gait training;Functional mobility training;Therapeutic activities;Therapeutic exercise;Balance training;Patient/family education;Stair training    PT Goals (Current goals can be found in the Care Plan section)  Acute Rehab PT Goals Patient Stated Goal: did not state PT Goal Formulation: Patient unable to participate in goal setting Time For Goal Achievement: 06/19/23 Potential to Achieve Goals: Fair    Frequency Min 1X/week     Co-evaluation               AM-PAC PT "6  Clicks" Mobility  Outcome Measure Help needed turning from your back to your side while in a flat bed without using bedrails?: A Little Help needed moving from lying on your back to sitting on the side of a flat bed without using bedrails?: A Little Help needed moving to and from a bed to a chair (including a wheelchair)?: A Little Help needed standing up from a chair using your arms (e.g., wheelchair or bedside chair)?: A Little Help needed to walk in hospital room?: A Little Help needed climbing 3-5 steps with a railing? : A Lot 6 Click Score: 17    End of Session Equipment Utilized During Treatment: Gait belt Activity Tolerance: Patient tolerated treatment well Patient left: in chair;with call bell/phone within reach;with chair alarm set Nurse Communication: Mobility status PT Visit Diagnosis: Unsteadiness on feet (R26.81);Muscle weakness (generalized) (M62.81);History of falling (Z91.81)    Time: 1610-9604 PT Time Calculation (min) (ACUTE ONLY): 24 min   Charges:   PT Evaluation $PT Eval Moderate Complexity: 1 Mod PT Treatments $Therapeutic Activity: 8-22 mins PT General Charges $$ ACUTE PT VISIT: 1 Visit         Maylon Peppers, PT, DPT Physical Therapist - [facility]  [facility]   Roderick Sweezy A Clavin Ruhlman 06/05/2023, 10:13 AM

## 2023-06-05 NOTE — Care Management Obs Status (Signed)
MEDICARE OBSERVATION STATUS NOTIFICATION   Patient Details  Name: Roger Cook MRN: [id number] Date of Birth: [birth year]   Medicare Observation Status Notification Given:  Yes Reviewed and provided copy with patient and spouse at bedside.    Charmian Forbis E Denni France, LCSW 06/05/2023, 3:42 PM

## 2023-06-06 DIAGNOSIS — G4733 Obstructive sleep apnea (adult) (pediatric): Secondary | ICD-10-CM

## 2023-06-06 DIAGNOSIS — E785 Hyperlipidemia, unspecified: Secondary | ICD-10-CM | POA: Diagnosis not present

## 2023-06-06 DIAGNOSIS — D649 Anemia, unspecified: Secondary | ICD-10-CM | POA: Diagnosis not present

## 2023-06-06 DIAGNOSIS — Z86711 Personal history of pulmonary embolism: Secondary | ICD-10-CM | POA: Diagnosis not present

## 2023-06-06 DIAGNOSIS — R410 Disorientation, unspecified: Secondary | ICD-10-CM | POA: Diagnosis not present

## 2023-06-06 DIAGNOSIS — I10 Essential (primary) hypertension: Secondary | ICD-10-CM | POA: Diagnosis not present

## 2023-06-06 DIAGNOSIS — R55 Syncope and collapse: Secondary | ICD-10-CM | POA: Diagnosis not present

## 2023-06-06 DIAGNOSIS — E039 Hypothyroidism, unspecified: Secondary | ICD-10-CM | POA: Diagnosis not present

## 2023-06-06 DIAGNOSIS — N1832 Chronic kidney disease, stage 3b: Secondary | ICD-10-CM | POA: Diagnosis not present

## 2023-06-06 MED ORDER — AMLODIPINE BESYLATE 5 MG PO TABS: 5.0000 mg | ORAL_TABLET | Freq: Every day | ORAL | 0 refills | Status: DC

## 2023-06-06 NOTE — Progress Notes (Signed)
    Durable Medical Equipment  (From admission, onward)           Start     Ordered   06/06/23 1231  For home use only DME wheelchair cushion (seat and back)  Once        06/06/23 1230

## 2023-06-06 NOTE — Discharge Summary (Signed)
Physician Discharge Summary   Patient: Roger Cook MRN: [id number] DOB: 04/18/1927  Admit date:     06/04/2023  Discharge date: 06/06/23  Discharge Physician: Roger Cook   PCP: Roger Limes, MD   Recommendations at discharge:   Follow-up PCP 5 days  Discharge Diagnoses: Principal Problem:   Syncope Active Problems:   Anemia requiring transfusions   Essential hypertension   Acute delirium   Hypothyroidism   History of pulmonary embolism   Hyperlipidemia, unspecified   CKD stage 3b, GFR 30-44 ml/min (HCC)   Sleep apnea   Anxiety    Hospital Course:  89 y.o. male with medical history significant of dementia, chronic kidney disease, hypothyroidism, hyperlipidemia, CAD.  Patient was recently in the hospital and given a unit of blood for anemia.  With his age and dementia they did not want any invasive procedures.  He was discharged home and they could not get him in the house they had trouble getting him in the house and he passed out in the doorway.  His legs were not working well.  They called 911.  Last night in the hospital he was disoriented.  Patient was able to be awakened but went back asleep easily.   9/13.  Patient was able to sit up and work with physical therapy was able to follow commands.  Mental status improved today from yesterday.  EEG still pending read.  CT scan of the head negative 9/14.  EEG read as negative.  Patient was alert and awake and follow commands.  Walked 80 feet yesterday afternoon with physical therapy.  Assessment and Plan: * Syncope Patient walked 80 feet with mobility specialist yesterday afternoon.  Slept last night.  No shaking movements when I saw the last 2 days.  VBG normal range.  EEG negative.  CT scan of the head negative.  Anemia requiring transfusions Last hemoglobin 10.5.  Continue to monitor.  Watch for signs of bleeding.  Acute delirium Mental status improved from admission.  Essential hypertension With relative  bradycardia will switched Toprol over to Norvasc.  History of pulmonary embolism On Eliquis  Hypothyroidism On Synthroid  CKD stage 3b, GFR 30-44 ml/min (HCC) Creatinine down to 1.35 with a GFR of 49  Hyperlipidemia, unspecified Hold pravastatin.  CK normal range.  Anxiety On Paxil  Sleep apnea On CPAP at night         Consultants: None Procedures performed: None Disposition: Home health Diet recommendation:  Cardiac diet DISCHARGE MEDICATION: Allergies as of 06/06/2023       Reactions   Iodinated Contrast Media    Levofloxacin Swelling   lips swelling   Povidone Iodine Other (See Comments)   Unknown, can not remember   Sulfa Antibiotics    Unknown, can not remember   Benadryl [diphenhydramine] Rash        Medication List     STOP taking these medications    metoprolol succinate 25 MG 24 hr tablet Commonly known as: TOPROL-XL   pravastatin 40 MG tablet Commonly known as: PRAVACHOL       TAKE these medications    acetaminophen 325 MG tablet Commonly known as: TYLENOL Take 2 tablets (650 mg total) by mouth every 6 (six) hours as needed for mild pain (or Fever >/= 101).   amLODipine 5 MG tablet Commonly known as: NORVASC Take 1 tablet (5 mg total) by mouth daily. Start taking on: June 07, 2023   apixaban 2.5 MG Tabs tablet Commonly known as: ELIQUIS Take 1  tablet (2.5 mg total) by mouth 2 (two) times daily. Reduced dose for for prevention of blood clots, in the setting of likely GI bleed.   ARTIFICIAL TEAR SOLUTION OP Place 1 drop into both eyes 2 (two) times daily as needed (dry eyes).   brimonidine 0.2 % ophthalmic solution Commonly known as: ALPHAGAN Place 1 drop into the left eye 2 (two) times daily.   CALCIUM 1200 PO Take 1,200 mg by mouth daily.   cyanocobalamin 1000 MCG tablet Commonly known as: VITAMIN B12 Take 1 tablet (1,000 mcg total) by mouth daily.   ferrous sulfate 325 (65 FE) MG tablet Take 1 tablet (325 mg  total) by mouth every other day.   isosorbide mononitrate 30 MG 24 hr tablet Commonly known as: IMDUR Take 0.5 tablets (15 mg total) by mouth daily.   levothyroxine 88 MCG tablet Commonly known as: SYNTHROID TAKE 1 TABLET EVERY DAY ON EMPTY STOMACHWITH A GLASS OF WATER AT LEAST 30-60 MINBEFORE BREAKFAST   memantine 10 MG tablet Commonly known as: NAMENDA TAKE 1 TABLET BY MOUTH TWO TIMES DAILY   MULTIPLE VITAMIN PO Take 1 tablet by mouth daily.   NON FORMULARY CPAP (Free Text) - Historical Medication  As directed  Started 22-Sep-1994 Active   pantoprazole 40 MG tablet Commonly known as: Protonix Take 1 tablet (40 mg total) by mouth 2 (two) times daily.   PARoxetine 10 MG tablet Commonly known as: PAXIL Take 10 mg by mouth daily.   Prevagen 10 MG Caps Generic drug: Apoaequorin Take 10 mg by mouth daily.               Durable Medical Equipment  (From admission, onward)           Start     Ordered   06-14-2023 1231  For home use only DME wheelchair cushion (seat and back)  Once        06/14/2023 1230            Discharge Exam: Filed Weights   06/04/23 2137  Weight: 62.5 kg   Physical Exam HENT:     Head: Normocephalic.     Mouth/Throat:     Pharynx: No oropharyngeal exudate.  Eyes:     General: Lids are normal.     Conjunctiva/sclera: Conjunctivae normal.  Cardiovascular:     Rate and Rhythm: Normal rate and regular rhythm.     Heart sounds: Normal heart sounds, S1 normal and S2 normal.  Pulmonary:     Breath sounds: No decreased breath sounds, wheezing, rhonchi or rales.  Abdominal:     Palpations: Abdomen is soft.     Tenderness: There is no abdominal tenderness.  Musculoskeletal:     Right lower leg: No swelling.     Left lower leg: No swelling.  Skin:    General: Skin is warm.     Findings: No rash.  Neurological:     Mental Status: He is alert.     Comments: Able to answer few questions.  Able to follow some simple commands.  Able to  lift legs up off the chair and arms up in the air.      Condition at discharge: fair  The results of significant diagnostics from this hospitalization (including imaging, microbiology, ancillary and laboratory) are listed below for reference.   Imaging Studies: EEG adult  Result Date: 06/14/23 Jefferson Fuel, MD     14-Jun-2023 12:03 PM Routine EEG Report Roger Cook is a 89 y.o. male with  a history of syncope who is undergoing an EEG to evaluate for seizures. Report: This EEG was acquired with electrodes placed according to the International 10-20 electrode system (including Fp1, Fp2, F3, F4, C3, C4, P3, P4, O1, O2, T3, T4, T5, T6, A1, A2, Fz, Cz, Pz). The following electrodes were missing or displaced: none. The occipital dominant rhythm was 8.5 Hz. This activity is reactive to stimulation. Drowsiness was manifested by background fragmentation; deeper stages of sleep were not identified. There was no focal slowing. There were no interictal epileptiform discharges. There were no electrographic seizures identified. There was no abnormal response to photic stimulation or hyperventilation. Impression: This EEG was obtained while awake and drowsy and is normal.   Clinical Correlation: Normal EEGs, however, do not rule out epilepsy. Bing Neighbors, MD [facility] [phone] If 7pm- 7am, please page neurology on call as listed in AMION.   CT HEAD WO CONTRAST ( )  Result Date: 06/05/2023 CLINICAL DATA:  Altered mental status, head trauma EXAM: CT HEAD WITHOUT CONTRAST TECHNIQUE: Contiguous axial images were obtained from the base of the skull through the vertex without intravenous contrast. RADIATION DOSE REDUCTION: This exam was performed according to the departmental dose-optimization program which includes automated exposure control, adjustment of the mA and/or kV according to patient size and/or use of iterative reconstruction technique. COMPARISON:  06/02/2023 FINDINGS: Brain: No  evidence of acute infarction, hemorrhage, mass, mass effect, or midline shift. No hydrocephalus or extra-axial fluid collection. Periventricular white matter changes, likely the sequela of chronic small vessel ischemic disease. Vascular: No hyperdense vessel. Atherosclerotic calcifications in the intracranial carotid and vertebral arteries. Skull: Negative for fracture or focal lesion. Sinuses/Orbits: No acute finding. Other: The mastoid air cells are well aerated. IMPRESSION: No acute intracranial process. Electronically Signed   By: Wiliam Ke M.D.   On: 06/05/2023 19:18   DG Chest Port 1 View  Result Date: 06/04/2023 CLINICAL DATA:  Altered mental status and weakness EXAM: PORTABLE CHEST 1 VIEW COMPARISON:  Chest radiograph dated 06/02/2023 FINDINGS: Low lung volumes. No focal consolidations. No pleural effusion or pneumothorax. The heart size and mediastinal contours are within normal limits. No acute osseous abnormality. IMPRESSION: No active disease. Electronically Signed   By: Agustin Cree M.D.   On: 06/04/2023 20:00   DG Chest Port 1 View  Result Date: 06/03/2023 CLINICAL DATA:  Altered mental status EXAM: PORTABLE CHEST 1 VIEW COMPARISON:  05/11/2023 FINDINGS: The heart size and mediastinal contours are within normal limits. Aortic atherosclerosis. Both lungs are clear. The visualized skeletal structures are unremarkable. IMPRESSION: No active disease. Electronically Signed   By: Jasmine Pang M.D.   On: 06/03/2023 00:03   CT Head Wo Contrast  Result Date: 06/03/2023 CLINICAL DATA:  Altered not talking or responding EXAM: CT HEAD WITHOUT CONTRAST TECHNIQUE: Contiguous axial images were obtained from the base of the skull through the vertex without intravenous contrast. RADIATION DOSE REDUCTION: This exam was performed according to the departmental dose-optimization program which includes automated exposure control, adjustment of the mA and/or kV according to patient size and/or use of iterative  reconstruction technique. COMPARISON:  CT brain 05/12/2023 FINDINGS: Brain: No acute territorial infarction, hemorrhage or intracranial mass. Atrophy and chronic small vessel ischemic changes of the white matter. Stable ventricle size Vascular: No hyperdense vessels.  Carotid vascular calcification Skull: Normal. Negative for fracture or focal lesion. Sinuses/Orbits: No acute finding. Other: None IMPRESSION: 1. No CT evidence for acute intracranial abnormality. 2. Atrophy and chronic small vessel ischemic changes of  the white matter. Electronically Signed   By: Jasmine Pang M.D.   On: 06/03/2023 00:03   CT Head Wo Contrast  Result Date: 05/12/2023 CLINICAL DATA:  Mechanical fall, on blood thinners EXAM: CT HEAD WITHOUT CONTRAST CT CERVICAL SPINE WITHOUT CONTRAST TECHNIQUE: Multidetector CT imaging of the head and cervical spine was performed following the standard protocol without intravenous contrast. Multiplanar CT image reconstructions of the cervical spine were also generated. RADIATION DOSE REDUCTION: This exam was performed according to the departmental dose-optimization program which includes automated exposure control, adjustment of the mA and/or kV according to patient size and/or use of iterative reconstruction technique. COMPARISON:  04/08/2023 CT head, no prior CT cervical spine FINDINGS: CT HEAD FINDINGS Brain: No evidence of acute infarct, hemorrhage, mass, mass effect, or midline shift. No hydrocephalus or extra-axial fluid collection. Periventricular white matter changes, likely the sequela of chronic small vessel ischemic disease. Remote lacunar infarcts in the left basal ganglia. Age related cerebral atrophy. Vascular: No hyperdense vessel. Atherosclerotic calcifications in the intracranial carotid and vertebral arteries. Skull: Negative for fracture or focal lesion. Sinuses/Orbits: Mucosal thickening in the right maxillary sinus. Complete opacification of the bilateral frontal sinuses.  Sequela prior endoscopic sinus surgery. Status post bilateral lens replacements. Other: The mastoid air cells are well aerated. CT CERVICAL SPINE FINDINGS Alignment: No traumatic listhesis. Trace anterolisthesis of to C4 on C5, which appears facet mediated. Skull base and vertebrae: No acute fracture or suspicious osseous lesion. Osseous fusion of C2 and C3. Unchanged compression deformity of T3, which was present on the 12/20/2022 chest CT Soft tissues and spinal canal: No prevertebral fluid or swelling. No visible canal hematoma. Disc levels: Degenerative changes in the cervical spine. No significant spinal canal stenosis. We calcification in the spinal canal at the level of C2-C3 may be ligamentum flavum Upper chest: Negative. IMPRESSION: 1. No acute intracranial process. 2. No acute fracture or traumatic listhesis in the cervical spine. Electronically Signed   By: Wiliam Ke M.D.   On: 05/12/2023 21:12   CT Cervical Spine Wo Contrast  Result Date: 05/12/2023 CLINICAL DATA:  Mechanical fall, on blood thinners EXAM: CT HEAD WITHOUT CONTRAST CT CERVICAL SPINE WITHOUT CONTRAST TECHNIQUE: Multidetector CT imaging of the head and cervical spine was performed following the standard protocol without intravenous contrast. Multiplanar CT image reconstructions of the cervical spine were also generated. RADIATION DOSE REDUCTION: This exam was performed according to the departmental dose-optimization program which includes automated exposure control, adjustment of the mA and/or kV according to patient size and/or use of iterative reconstruction technique. COMPARISON:  04/08/2023 CT head, no prior CT cervical spine FINDINGS: CT HEAD FINDINGS Brain: No evidence of acute infarct, hemorrhage, mass, mass effect, or midline shift. No hydrocephalus or extra-axial fluid collection. Periventricular white matter changes, likely the sequela of chronic small vessel ischemic disease. Remote lacunar infarcts in the left basal  ganglia. Age related cerebral atrophy. Vascular: No hyperdense vessel. Atherosclerotic calcifications in the intracranial carotid and vertebral arteries. Skull: Negative for fracture or focal lesion. Sinuses/Orbits: Mucosal thickening in the right maxillary sinus. Complete opacification of the bilateral frontal sinuses. Sequela prior endoscopic sinus surgery. Status post bilateral lens replacements. Other: The mastoid air cells are well aerated. CT CERVICAL SPINE FINDINGS Alignment: No traumatic listhesis. Trace anterolisthesis of to C4 on C5, which appears facet mediated. Skull base and vertebrae: No acute fracture or suspicious osseous lesion. Osseous fusion of C2 and C3. Unchanged compression deformity of T3, which was present on the 12/20/2022 chest CT  Soft tissues and spinal canal: No prevertebral fluid or swelling. No visible canal hematoma. Disc levels: Degenerative changes in the cervical spine. No significant spinal canal stenosis. We calcification in the spinal canal at the level of C2-C3 may be ligamentum flavum Upper chest: Negative. IMPRESSION: 1. No acute intracranial process. 2. No acute fracture or traumatic listhesis in the cervical spine. Electronically Signed   By: Wiliam Ke M.D.   On: 05/12/2023 21:12   DG Chest Port 1 View  Result Date: 05/11/2023 CLINICAL DATA:  Syncope EXAM: PORTABLE CHEST 1 VIEW COMPARISON:  04/08/2023 FINDINGS: Heart and mediastinal contours are within normal limits. No focal opacities or effusions. No acute bony abnormality. Aortic atherosclerosis. IMPRESSION: No active disease. Electronically Signed   By: Charlett Nose M.D.   On: 05/11/2023 22:23    Microbiology: Results for orders placed or performed during the hospital encounter of 06/02/23  SARS Coronavirus 2 by RT PCR (hospital order, performed in [facility] hospital lab) *cepheid single result test* Anterior Nasal Swab     Status: None   Collection Time: 06/02/23 11:47 PM   Specimen: Anterior Nasal Swab   Result Value Ref Range Status   SARS Coronavirus 2 by RT PCR NEGATIVE NEGATIVE Final    Comment: (NOTE) SARS-CoV-2 target nucleic acids are NOT DETECTED.  The SARS-CoV-2 RNA is generally detectable in upper and lower respiratory specimens during the acute phase of infection. The lowest concentration of SARS-CoV-2 viral copies this assay can detect is 250 copies / mL. A negative result does not preclude SARS-CoV-2 infection and should not be used as the sole basis for treatment or other patient management decisions.  A negative result may occur with improper specimen collection / handling, submission of specimen other than nasopharyngeal swab, presence of viral mutation(s) within the areas targeted by this assay, and inadequate number of viral copies (<250 copies / mL). A negative result must be combined with clinical observations, patient history, and epidemiological information.  Fact Sheet for Patients:   RoadLapTop.co.za  Fact Sheet for Healthcare Providers: http://kim-miller.com/  This test is not yet approved or  cleared by the [place] FDA and has been authorized for detection and/or diagnosis of SARS-CoV-2 by FDA under an Emergency Use Authorization (EUA).  This EUA will remain in effect (meaning this test can be used) for the duration of the COVID-19 declaration under Section 564(b)(1) of the Act, 21 U.S.C. section 360bbb-3(b)(1), unless the authorization is terminated or revoked sooner.  Performed at [facility], [address] [place], [place] [address]     Labs: CBC: Recent Labs  Lab 06/02/23 2347 06/03/23 0224 06/03/23 1054 06/03/23 1400 06/03/23 1725 06/04/23 0442 06/04/23 1537 06/05/23 0406  WBC 5.0 5.1  --   --   --  6.8 8.3 5.8  NEUTROABS 2.7  --   --   --   --   --  5.1  --   HGB 7.7* 8.8*   < > 10.4* 12.0* 11.1* 10.4* 10.5*  HCT 24.4* 27.0*   < > 31.8* 37.3* 33.3* 32.0* 31.8*  MCV 98.0  96.4  --   --   --  92.0 95.5 93.8  PLT 199 202  --   --   --  263 278 212   < > = values in this interval not displayed.   Basic Metabolic Panel: Recent Labs  Lab 06/02/23 2347 06/03/23 0224 06/04/23 0442 06/04/23 1537 06/05/23 0406  NA 139 139 139 136 140  K 3.8 4.0  3.6 3.8 3.9  CL 110 107 108 108 108  CO2 23 25 21* 17* 24  GLUCOSE 140* 125* 122* 124* 113*  BUN 30* 31* 21 22 25*  CREATININE 1.67* 1.83* 1.51* 1.52* 1.35*  CALCIUM 7.9* 8.5* 8.7* 8.0* 8.5*   Liver Function Tests: Recent Labs  Lab 06/02/23 2347 06/04/23 1537 06/05/23 0406  AST 20 22 18   ALT 17 14 15   ALKPHOS 61 54 58  BILITOT 0.4 0.5 0.8  PROT 5.6* 5.6* 5.6*  ALBUMIN 2.9* 3.0* 3.0*   CBG: No results for input(s): "GLUCAP" in the last 168 hours.  Discharge time spent: greater than 30 minutes.  Signed: Alford Highland, MD [facility] 06/06/2023

## 2023-06-06 NOTE — Progress Notes (Signed)
Physical Therapy Treatment Patient Details Name: Roger Cook MRN: [id number] DOB: 1927-05-10 Today's Date: 06/06/2023   History of Present Illness 89 y/o male presented to ED on 06/04/23 for AMS and syncope. D/c'ed on same day for GI bleed and anemia after 2 day stay. PMH: CAD, HTN, dementia, OSA on CPAP, CKD    PT Comments  Pt was long sitting in bed with untouched breakfast tray in front of him. He is alert and agreeable but pleasantly confused/disoriented. He agrees to OOB activity and was able to exit L side of bed with min-mod assist of one. Stood to [employer] with min assist + vcs for fwd wt shift. Initial posterior push noted that improved with time. Pt progressed to ambulating with RW however has slow cadence and needs CGA for safety. Author contacts pt's spouse post session to discuss pt's abilities and confirm he will have adequate assistance at home. Family still prefers home with HH at DC. Acute PT will continue to follow per current POC.     If plan is discharge home, recommend the following: A little help with walking and/or transfers;A little help with bathing/dressing/bathroom;Assistance with cooking/housework;Direct supervision/assist for financial management;Direct supervision/assist for medications management;Help with stairs or ramp for entrance;Assist for transportation;Supervision due to cognitive status     Equipment Recommendations  None recommended by PT       Precautions / Restrictions Precautions Precautions: Fall Restrictions Weight Bearing Restrictions: No     Mobility  Bed Mobility Overal bed mobility: Needs Assistance Bed Mobility: Supine to Sit  Supine to sit: Min assist, Mod assist   Transfers Overall transfer level: Needs assistance Equipment used: Rolling walker (2 wheels) Transfers: Sit to/from Stand Sit to Stand: Min assist   Ambulation/Gait Ambulation/Gait assistance: Contact guard assist Gait Distance (Feet): 75 Feet Assistive device: Rolling  walker (2 wheels) Gait Pattern/deviations: Decreased stride length, Step-to pattern  General Gait Details: CGA for safety. No LOB noted. Very slow gait . Will need family close supervision/assistance at DC for safety   Balance Overall balance assessment: Needs assistance Sitting-balance support: Feet supported Sitting balance-Leahy Scale: Good     Standing balance support: Bilateral upper extremity supported, Reliant on assistive device for balance Standing balance-Leahy Scale: Poor Standing balance comment: pt remains at high risk of falls      Cognition Arousal: Alert Behavior During Therapy: Flat affect Overall Cognitive Status: History of cognitive impairments - at baseline Area of Impairment: Following commands, Safety/judgement    Orientation Level: Disoriented to, Time, Situation   Memory: Decreased short-term memory Following Commands: Follows one step commands with increased time, Follows multi-step commands inconsistently Safety/Judgement: Decreased awareness of safety, Decreased awareness of deficits     General Comments: Pt is A and agreeable. pleasantly confused.               Pertinent Vitals/Pain Pain Assessment Pain Assessment: No/denies pain     PT Goals (current goals can now be found in the care plan section) Acute Rehab PT Goals Patient Stated Goal: did not state Progress towards PT goals: Progressing toward goals    Frequency    Min 1X/week           Co-evaluation     PT goals addressed during session: Mobility/safety with mobility;Balance;Proper use of DME;Strengthening/ROM        AM-PAC PT "6 Clicks" Mobility   Outcome Measure  Help needed turning from your back to your side while in a flat bed without using bedrails?: A Little Help needed  moving from lying on your back to sitting on the side of a flat bed without using bedrails?: A Lot Help needed moving to and from a bed to a chair (including a wheelchair)?: A Little Help  needed standing up from a chair using your arms (e.g., wheelchair or bedside chair)?: A Little Help needed to walk in hospital room?: A Little Help needed climbing 3-5 steps with a railing? : A Lot 6 Click Score: 16    End of Session   Activity Tolerance: Patient tolerated treatment well Patient left: in chair;with call bell/phone within reach;with chair alarm set Nurse Communication: Mobility status PT Visit Diagnosis: Unsteadiness on feet (R26.81);Muscle weakness (generalized) (M62.81);History of falling (Z91.81)     Time: 1012-1030 PT Time Calculation (min) (ACUTE ONLY): 18 min  Charges:    $Gait Training: 8-22 mins PT General Charges $$ ACUTE PT VISIT: 1 Visit                     Jetta Lout PTA 06/06/23, 11:39 AM

## 2023-06-06 NOTE — Plan of Care (Signed)
Resolve plan of care. Adequate for discharge to home.  Cornell Barman Phylisha Dix

## 2023-06-06 NOTE — Procedures (Signed)
Routine EEG Report  Roger Cook is a 89 y.o. male with a history of syncope who is undergoing an EEG to evaluate for seizures.  Report: This EEG was acquired with electrodes placed according to the International 10-20 electrode system (including Fp1, Fp2, F3, F4, C3, C4, P3, P4, O1, O2, T3, T4, T5, T6, A1, A2, Fz, Cz, Pz). The following electrodes were missing or displaced: none.  The occipital dominant rhythm was 8.5 Hz. This activity is reactive to stimulation. Drowsiness was manifested by background fragmentation; deeper stages of sleep were not identified. There was no focal slowing. There were no interictal epileptiform discharges. There were no electrographic seizures identified. There was no abnormal response to photic stimulation or hyperventilation.   Impression: This EEG was obtained while awake and drowsy and is normal.    Clinical Correlation: Normal EEGs, however, do not rule out epilepsy.  Bing Neighbors, MD [facility] [phone]  If 7pm- 7am, please page neurology on call as listed in AMION.

## 2023-06-06 NOTE — TOC Transition Note (Addendum)
Transition of Care [facility]) - CM/SW Discharge Note   Patient Details  Name: Roger Cook MRN: [id number] Date of Birth: [birth year]  Transition of Care [facility]) CM/SW Contact:  Liliana Cline, LCSW Phone Number: 06/06/2023, 12:27 PM   Clinical Narrative:    Patient to DC home today. Notified Cheryl with [facility]. Family requesting wheelchair. Referral made to [facility] with [employer] for delivery prior to DC today.   Final next level of care: Home w Home Health Services Barriers to Discharge: Barriers Resolved   Patient Goals and CMS Choice      Discharge Placement                         Discharge Plan and Services Additional resources added to the After Visit Summary for                            [facility] Arranged: PT, OT, RN [facility] Agency: [employer] [facility] Date [facility] Agency Contacted: 06/06/23   Representative spoke with at [facility] Agency: Elnita Maxwell  Social Determinants of Health (SDOH) Interventions SDOH Screenings   Food Insecurity: Patient Unable To Answer (06/03/2023)  Housing: Patient Unable To Answer (06/03/2023)  Transportation Needs: Patient Unable To Answer (06/03/2023)  Utilities: Not At Risk (06/03/2023)  Alcohol Screen: Low Risk  (06/01/2023)  Depression (PHQ2-9): Low Risk  (06/01/2023)  Financial Resource Strain: Low Risk  (06/01/2023)  Physical Activity: Inactive (06/01/2023)  Social Connections: Socially Isolated (06/01/2023)  Stress: No Stress Concern Present (06/01/2023)  Tobacco Use: Medium Risk (06/04/2023)  Health Literacy: Adequate Health Literacy (06/01/2023)     Readmission Risk Interventions    06/03/2023   10:35 AM 04/09/2023   10:35 AM 01/26/2023    3:59 PM  Readmission Risk Prevention Plan  Transportation Screening Complete Complete Complete  Medication Review (RN Care Manager) Complete Complete Complete  PCP or Specialist appointment within 3-5 days of discharge Complete -- Complete  HRI or Home Care Consult Complete Complete Complete  SW  Recovery Care/Counseling Consult Complete Complete Complete  Palliative Care Screening Not Applicable Not Applicable Not Applicable  Skilled Nursing Facility Not Applicable Not Applicable Not Applicable

## 2023-06-09 DIAGNOSIS — D649 Anemia, unspecified: Secondary | ICD-10-CM | POA: Diagnosis not present

## 2023-06-09 DIAGNOSIS — I2510 Atherosclerotic heart disease of native coronary artery without angina pectoris: Secondary | ICD-10-CM | POA: Diagnosis not present

## 2023-06-09 DIAGNOSIS — G510 Bell's palsy: Secondary | ICD-10-CM | POA: Diagnosis not present

## 2023-06-09 DIAGNOSIS — K219 Gastro-esophageal reflux disease without esophagitis: Secondary | ICD-10-CM | POA: Diagnosis not present

## 2023-06-09 DIAGNOSIS — I10 Essential (primary) hypertension: Secondary | ICD-10-CM | POA: Diagnosis not present

## 2023-06-09 DIAGNOSIS — F39 Unspecified mood [affective] disorder: Secondary | ICD-10-CM | POA: Diagnosis not present

## 2023-06-11 ENCOUNTER — Ambulatory Visit (INDEPENDENT_AMBULATORY_CARE_PROVIDER_SITE_OTHER): Payer: Medicare Other | Admitting: Family Medicine

## 2023-06-11 ENCOUNTER — Encounter: Payer: Self-pay | Admitting: Family Medicine

## 2023-06-11 VITALS — BP 130/62 | HR 67 | Ht 67.0 in | Wt 135.2 lb

## 2023-06-11 DIAGNOSIS — D631 Anemia in chronic kidney disease: Secondary | ICD-10-CM

## 2023-06-11 DIAGNOSIS — I10 Essential (primary) hypertension: Secondary | ICD-10-CM | POA: Diagnosis not present

## 2023-06-11 DIAGNOSIS — N1832 Chronic kidney disease, stage 3b: Secondary | ICD-10-CM | POA: Diagnosis not present

## 2023-06-11 DIAGNOSIS — Z23 Encounter for immunization: Secondary | ICD-10-CM

## 2023-06-11 MED ORDER — AMLODIPINE BESYLATE 5 MG PO TABS: 5.0000 mg | ORAL_TABLET | Freq: Every day | ORAL | 3 refills | Status: DC

## 2023-06-11 NOTE — Progress Notes (Signed)
Established patient visit   Patient: Roger Cook   DOB: [birth year]   89 y.o. Male  MRN: [id number] Visit Date: 06/11/2023  Today's healthcare provider: Jacky Kindle, FNP  Introduced to nurse practitioner role and practice setting.  All questions answered.  Discussed provider/patient relationship and expectations.  Subjective    HPI HPI     Follow-up    Additional comments: Had medicine to help upper GI at the hospital and recently passed out last Friday   Can lab work be done to figure out why so much blood loss or possible iron infusions and refill needed for amlodipine       Last edited by Clois Comber on 06/11/2023  3:42 PM.      Admitted to [facility] on 9/12 following syncope; stopped metop tartrate and started on norvasc D/c on 9/14  Medications: Outpatient Medications Prior to Visit  Medication Sig   acetaminophen (TYLENOL) 325 MG tablet Take 2 tablets (650 mg total) by mouth every 6 (six) hours as needed for mild pain (or Fever >/= 101).   apixaban (ELIQUIS) 2.5 MG TABS tablet Take 1 tablet (2.5 mg total) by mouth 2 (two) times daily. Reduced dose for for prevention of blood clots, in the setting of likely GI bleed.   Apoaequorin (PREVAGEN) 10 MG CAPS Take 10 mg by mouth daily.   ARTIFICIAL TEAR SOLUTION OP Place 1 drop into both eyes 2 (two) times daily as needed (dry eyes).   brimonidine (ALPHAGAN) 0.2 % ophthalmic solution Place 1 drop into the left eye 2 (two) times daily.   Calcium Carbonate-Vit D-Min (CALCIUM 1200 PO) Take 1,200 mg by mouth daily.   cyanocobalamin (VITAMIN B12) 1000 MCG tablet Take 1 tablet (1,000 mcg total) by mouth daily.   ferrous sulfate 325 (65 FE) MG tablet Take 1 tablet (325 mg total) by mouth every other day.   isosorbide mononitrate (IMDUR) 30 MG 24 hr tablet Take 0.5 tablets (15 mg total) by mouth daily.   levothyroxine (SYNTHROID) 88 MCG tablet TAKE 1 TABLET EVERY DAY ON EMPTY STOMACHWITH A GLASS OF WATER AT LEAST 30-60 MINBEFORE  BREAKFAST   memantine (NAMENDA) 10 MG tablet TAKE 1 TABLET BY MOUTH TWO TIMES DAILY (Patient taking differently: Take 10 mg by mouth 2 (two) times daily.)   MULTIPLE VITAMIN PO Take 1 tablet by mouth daily.   NON FORMULARY CPAP (Free Text) - Historical Medication  As directed  Started 22-Sep-1994 Active   pantoprazole (PROTONIX) 40 MG tablet Take 1 tablet (40 mg total) by mouth 2 (two) times daily.   PARoxetine (PAXIL) 10 MG tablet Take 10 mg by mouth daily.   [DISCONTINUED] amLODipine (NORVASC) 5 MG tablet Take 1 tablet (5 mg total) by mouth daily.   No facility-administered medications prior to visit.     Objective    BP 130/62 (BP Location: Right Arm, Patient Position: Sitting, Cuff Size: Normal)   Pulse 67   Ht 5\' 7"  (1.702 m)   Wt 135 lb 3.2 oz (61.3 kg)   SpO2 (!) 87%   BMI 21.18 kg/m   Physical Exam Vitals and nursing note reviewed.  Constitutional:      Appearance: Normal appearance. He is normal weight.  HENT:     Head: Normocephalic and atraumatic.  Cardiovascular:     Rate and Rhythm: Normal rate and regular rhythm.     Pulses: Normal pulses.     Heart sounds: Normal heart sounds.  Pulmonary:     Effort:  Pulmonary effort is normal.     Breath sounds: Normal breath sounds.  Musculoskeletal:        General: Normal range of motion.     Cervical back: Normal range of motion.  Skin:    General: Skin is warm and dry.     Capillary Refill: Capillary refill takes less than 2 seconds.  Neurological:     Mental Status: He is alert and oriented to person, place, and time. Mental status is at baseline.     Motor: Weakness present.     Gait: Gait abnormal.  Psychiatric:        Mood and Affect: Mood normal.        Behavior: Behavior normal.     No results found for any visits on 06/11/23.  Assessment & Plan     Problem List Items Addressed This Visit       Cardiovascular and Mediastinum   Essential hypertension    Chronic, stable since switch in BP agents Refills  provided on norvasc       Relevant Medications   amLODipine (NORVASC) 5 MG tablet     Genitourinary   Anemia due to stage 3b chronic kidney disease (HCC) - Primary    Chronic, variable Repeat labs in 1 month Continue to monitor BP given change from metop tartrate to norvasc      Other Visit Diagnoses     Encounter for immunization       Relevant Orders   Flu Vaccine Trivalent High Dose (Fluad) (Completed)      Return in about 4 weeks (around 07/09/2023), or if symptoms worsen or fail to improve, for labs.   Addressed extensive list of chronic and acute medical problems today requiring 30 minutes reviewing his medical record, counseling patient regarding his conditions and coordination of care.      Leilani Merl, FNP, have reviewed all documentation for this visit. The documentation on 06/11/23 for the exam, diagnosis, procedures, and orders are all accurate and complete.  Jacky Kindle, FNP  [facility] [phone] (phone) [phone] (fax)  [facility]

## 2023-06-11 NOTE — Assessment & Plan Note (Signed)
Chronic, stable since switch in BP agents Refills provided on norvasc

## 2023-06-11 NOTE — Assessment & Plan Note (Signed)
Chronic, variable Repeat labs in 1 month Continue to monitor BP given change from metop tartrate to norvasc

## 2023-06-14 ENCOUNTER — Emergency Department: Payer: Medicare Other

## 2023-06-14 ENCOUNTER — Other Ambulatory Visit: Payer: Self-pay

## 2023-06-14 ENCOUNTER — Emergency Department
Admission: EM | Admit: 2023-06-14 | Discharge: 2023-06-14 | Disposition: A | Discharge status: Home / Self Care | Payer: Medicare Other | Attending: Emergency Medicine | Admitting: Emergency Medicine

## 2023-06-14 DIAGNOSIS — W19XXXA Unspecified fall, initial encounter: Secondary | ICD-10-CM | POA: Diagnosis not present

## 2023-06-14 DIAGNOSIS — I6782 Cerebral ischemia: Secondary | ICD-10-CM | POA: Diagnosis not present

## 2023-06-14 DIAGNOSIS — S50312A Abrasion of left elbow, initial encounter: Secondary | ICD-10-CM | POA: Insufficient documentation

## 2023-06-14 DIAGNOSIS — I10 Essential (primary) hypertension: Secondary | ICD-10-CM

## 2023-06-14 DIAGNOSIS — F0390 Unspecified dementia without behavioral disturbance: Secondary | ICD-10-CM | POA: Diagnosis not present

## 2023-06-14 DIAGNOSIS — M5030 Other cervical disc degeneration, unspecified cervical region: Secondary | ICD-10-CM | POA: Diagnosis not present

## 2023-06-14 DIAGNOSIS — S50319A Abrasion of unspecified elbow, initial encounter: Secondary | ICD-10-CM

## 2023-06-14 DIAGNOSIS — S0990XA Unspecified injury of head, initial encounter: Secondary | ICD-10-CM | POA: Insufficient documentation

## 2023-06-14 DIAGNOSIS — I2510 Atherosclerotic heart disease of native coronary artery without angina pectoris: Secondary | ICD-10-CM | POA: Diagnosis not present

## 2023-06-14 DIAGNOSIS — M25421 Effusion, right elbow: Secondary | ICD-10-CM | POA: Diagnosis not present

## 2023-06-14 DIAGNOSIS — Z7901 Long term (current) use of anticoagulants: Secondary | ICD-10-CM | POA: Insufficient documentation

## 2023-06-14 DIAGNOSIS — Z043 Encounter for examination and observation following other accident: Secondary | ICD-10-CM | POA: Diagnosis not present

## 2023-06-14 DIAGNOSIS — M47812 Spondylosis without myelopathy or radiculopathy, cervical region: Secondary | ICD-10-CM | POA: Diagnosis not present

## 2023-06-14 DIAGNOSIS — M4312 Spondylolisthesis, cervical region: Secondary | ICD-10-CM | POA: Diagnosis not present

## 2023-06-14 DIAGNOSIS — E860 Dehydration: Secondary | ICD-10-CM | POA: Diagnosis not present

## 2023-06-14 DIAGNOSIS — S50311A Abrasion of right elbow, initial encounter: Secondary | ICD-10-CM | POA: Diagnosis not present

## 2023-06-14 DIAGNOSIS — I959 Hypotension, unspecified: Secondary | ICD-10-CM | POA: Diagnosis not present

## 2023-06-14 DIAGNOSIS — W01198A Fall on same level from slipping, tripping and stumbling with subsequent striking against other object, initial encounter: Secondary | ICD-10-CM | POA: Diagnosis not present

## 2023-06-14 DIAGNOSIS — R55 Syncope and collapse: Secondary | ICD-10-CM | POA: Diagnosis not present

## 2023-06-14 DIAGNOSIS — M25521 Pain in right elbow: Secondary | ICD-10-CM | POA: Diagnosis present

## 2023-06-14 NOTE — ED Triage Notes (Addendum)
Pt comes with c/o fall. Pt states he tripped over the curb. Pt thinks he did hit his head.  Pt did not pass out per wife.   Pt has left elbow abrasion and states head pain. Pt is on thinners per wife. Pt is A&O and speaking clearly.

## 2023-06-14 NOTE — Discharge Instructions (Addendum)
You were seen in the emergency department after a fall.  You had a CT scan of your head and neck and x-rays of your elbows that did not show any broken bones or internal bleeding.  Talk with your primary care physician about whether you should continue on a blood thinner, given how many times you are falling concerned that you should no longer be on a blood thinner since this increases your risk of bad head bleeds.  Thank you for choosing [place] for your health care, it was my pleasure to care for you today!  Corena Herter, MD

## 2023-06-14 NOTE — ED Provider Notes (Signed)
[facility] Provider Note    Event Date/Time   First MD Initiated Contact with Patient 06/14/23 [phone]     (approximate)   History   Fall   HPI  Roger Cook is a 89 y.o. male past medical history significant for dementia, history of CAD, on Eliquis, who presents to the emergency department following a fall.  Patient tripped on the curb and fell hitting his head and bilateral elbows.  No loss of consciousness.  Complaining of pain to his right elbow.  Believes his tetanus is up-to-date.  Did not pass out.  No chest pain or shortness of breath.  No pain in his back or hips.  Denies any neck pain.     Physical Exam   Triage Vital Signs: ED Triage Vitals  Encounter Vitals Group     BP 06/14/23 1639 (!) 141/65     Systolic BP Percentile --      Diastolic BP Percentile --      Pulse Rate 06/14/23 1639 74     Resp 06/14/23 1639 18     Temp 06/14/23 1639 98 F (36.7 C)     Temp src --      SpO2 06/14/23 1639 100 %     Weight --      Height --      Head Circumference --      Peak Flow --      Pain Score 06/14/23 1638 4     Pain Loc --      Pain Education --      Exclude from Growth Chart --     Most recent vital signs: Vitals:   06/14/23 1639 06/14/23 1652  BP: (!) 141/65 (!) 181/53  Pulse: 74 62  Resp: 18 18  Temp: 98 F (36.7 C)   SpO2: 100% 100%    Physical Exam HENT:     Head: Atraumatic.     Right Ear: External ear normal.     Left Ear: External ear normal.  Eyes:     Extraocular Movements: Extraocular movements intact.     Pupils: Pupils are equal, round, and reactive to light.  Cardiovascular:     Rate and Rhythm: Normal rate.  Pulmonary:     Effort: Pulmonary effort is normal.  Abdominal:     General: There is no distension.  Musculoskeletal:        General: Normal range of motion.     Cervical back: No tenderness.     Right lower leg: No edema.     Left lower leg: No edema.     Comments: No midline thoracic or lumbar  tenderness to palpation.  No tenderness to bilateral hips.  No tenderness to bilateral lower extremities.  Abrasion to bilateral elbows with no underlying bony tenderness.  Skin:    General: Skin is warm.     Capillary Refill: Capillary refill takes less than 2 seconds.  Neurological:     Mental Status: He is alert. Mental status is at baseline.  Psychiatric:        Mood and Affect: Mood normal.      IMPRESSION / MDM / ASSESSMENT AND PLAN / ED COURSE  I reviewed the triage vital signs and the nursing notes.  Differential diagnosis including fracture, dislocation, abrasion, intracranial hemorrhage, skull fracture, cervical spine fracture   RADIOLOGY I independently reviewed imaging, my interpretation of imaging: CT scan of the head -no signs of endotracheal hemorrhage  CT scan cervical spine -  no acute fracture  Bilateral x-rays of the elbow -no acute fracture   Labs (all labs ordered are listed, but only abnormal results are displayed) Labs interpreted as -    Labs Reviewed - No data to display  Bilateral skin tears to the elbow.  Wounds were repaired at bedside and dressings applied.  Declined wanting anything for pain.  CT scans without acute finding.  No acute fracture or dislocation.  Discharged home in stable condition with return precautions.  Continue to have significantly elevated blood pressure while in the emergency department.  Discussed need to follow-up with primary care physician to repeat blood pressure and make sure that he is on the correct medications for blood pressure.  Discussed to talk with primary care physician about whether he would benefit from being on a blood thinner given all of the falls or if it should be discontinued.  Patient has a prescription for antihypertensive medications and will follow-up within 1 week with primary care provider.     PROCEDURES:  Critical Care performed: No  Procedures  Patient's presentation is most consistent  with acute presentation with potential threat to life or bodily function.   MEDICATIONS ORDERED IN ED: Medications - No data to display  FINAL CLINICAL IMPRESSION(S) / ED DIAGNOSES   Final diagnoses:  Fall, initial encounter  Injury of head, initial encounter  Abrasion of elbow, unspecified laterality, initial encounter  Uncontrolled hypertension     Rx / DC Orders   ED Discharge Orders     None        Note:  This document was prepared using Dragon voice recognition software and may include unintentional dictation errors.   Corena Herter, MD 06/14/23 [phone]

## 2023-06-14 NOTE — ED Triage Notes (Addendum)
Arrives via [facility]>  Per report, patient had syncopal episode while getting out of the car.  Abrasions to elbows bilaterally.  Seen for same 2 weeks ago -- IVF given. Hx dementia, takes eliquis.  Initial BP:  99/38 250 NS given by EMS  CBG:  113 97.9

## 2023-06-14 NOTE — ED Notes (Signed)
X-ray at bedside

## 2023-06-14 NOTE — ED Notes (Signed)
Pt fall, on blood thinner, hit head per triage note. EDP made aware.

## 2023-06-15 ENCOUNTER — Encounter: Payer: Self-pay | Admitting: Family Medicine

## 2023-06-15 ENCOUNTER — Telehealth: Payer: Self-pay | Admitting: *Deleted

## 2023-06-15 NOTE — Progress Notes (Unsigned)
Care Coordination  Outreach Note  06/15/2023 Name: Roger Cook MRN: [id number] DOB: 04-11-1927   Care Coordination Outreach Attempts: An unsuccessful telephone outreach was attempted today to offer the patient information about available care coordination services.  Follow Up Plan:  Additional outreach attempts will be made to offer the patient care coordination information and services.   Encounter Outcome:  No Answer  Burman Nieves, CCMA Care Coordination Care Guide Direct Dial: [phone]

## 2023-06-16 NOTE — Progress Notes (Signed)
Care Coordination   Note   06/16/2023 Name: Roger Cook MRN: [id number] DOB: 1927/04/19  Roger Cook is a 89 y.o. year old male who sees Fisher, Demetrios Isaacs, MD for primary care. I reached out to Leata Mouse by phone today to offer care coordination services.  Roger Cook was given information about Care Coordination services today including:   The Care Coordination services include support from the care team which includes your Nurse Coordinator, Clinical Social Worker, or Pharmacist.  The Care Coordination team is here to help remove barriers to the health concerns and goals most important to you. Care Coordination services are voluntary, and the patient may decline or stop services at any time by request to their care team member.   Care Coordination Consent Status: Patient did not agree to participate in care coordination services at this time.  Follow up plan:  none indicated   Encounter Outcome:  Patient Refused  Burman Nieves, CCMA Care Coordination Care Guide Direct Dial: [phone]

## 2023-06-17 ENCOUNTER — Emergency Department: Payer: Medicare Other

## 2023-06-17 ENCOUNTER — Inpatient Hospital Stay
Admission: EM | Admit: 2023-06-17 | Discharge: 2023-06-19 | DRG: 176 | Disposition: A | Discharge status: Hospice | Payer: Medicare Other | Attending: Internal Medicine | Admitting: Internal Medicine

## 2023-06-17 ENCOUNTER — Other Ambulatory Visit: Payer: Self-pay

## 2023-06-17 DIAGNOSIS — Z91041 Radiographic dye allergy status: Secondary | ICD-10-CM

## 2023-06-17 DIAGNOSIS — Z888 Allergy status to other drugs, medicaments and biological substances status: Secondary | ICD-10-CM

## 2023-06-17 DIAGNOSIS — Z86711 Personal history of pulmonary embolism: Secondary | ICD-10-CM

## 2023-06-17 DIAGNOSIS — G4733 Obstructive sleep apnea (adult) (pediatric): Secondary | ICD-10-CM | POA: Diagnosis present

## 2023-06-17 DIAGNOSIS — E785 Hyperlipidemia, unspecified: Secondary | ICD-10-CM | POA: Diagnosis present

## 2023-06-17 DIAGNOSIS — K219 Gastro-esophageal reflux disease without esophagitis: Secondary | ICD-10-CM | POA: Diagnosis present

## 2023-06-17 DIAGNOSIS — N1832 Chronic kidney disease, stage 3b: Secondary | ICD-10-CM | POA: Diagnosis present

## 2023-06-17 DIAGNOSIS — F03918 Unspecified dementia, unspecified severity, with other behavioral disturbance: Secondary | ICD-10-CM | POA: Diagnosis present

## 2023-06-17 DIAGNOSIS — R231 Pallor: Secondary | ICD-10-CM | POA: Diagnosis not present

## 2023-06-17 DIAGNOSIS — F0390 Unspecified dementia without behavioral disturbance: Secondary | ICD-10-CM | POA: Diagnosis present

## 2023-06-17 DIAGNOSIS — I959 Hypotension, unspecified: Secondary | ICD-10-CM | POA: Diagnosis present

## 2023-06-17 DIAGNOSIS — I6782 Cerebral ischemia: Secondary | ICD-10-CM | POA: Diagnosis not present

## 2023-06-17 DIAGNOSIS — I2699 Other pulmonary embolism without acute cor pulmonale: Secondary | ICD-10-CM | POA: Diagnosis not present

## 2023-06-17 DIAGNOSIS — Z8546 Personal history of malignant neoplasm of prostate: Secondary | ICD-10-CM

## 2023-06-17 DIAGNOSIS — Z882 Allergy status to sulfonamides status: Secondary | ICD-10-CM

## 2023-06-17 DIAGNOSIS — Z87891 Personal history of nicotine dependence: Secondary | ICD-10-CM

## 2023-06-17 DIAGNOSIS — Z79899 Other long term (current) drug therapy: Secondary | ICD-10-CM

## 2023-06-17 DIAGNOSIS — Z7901 Long term (current) use of anticoagulants: Secondary | ICD-10-CM

## 2023-06-17 DIAGNOSIS — Z801 Family history of malignant neoplasm of trachea, bronchus and lung: Secondary | ICD-10-CM

## 2023-06-17 DIAGNOSIS — R4182 Altered mental status, unspecified: Secondary | ICD-10-CM | POA: Diagnosis not present

## 2023-06-17 DIAGNOSIS — Z7189 Other specified counseling: Secondary | ICD-10-CM

## 2023-06-17 DIAGNOSIS — I700 Atherosclerosis of aorta: Secondary | ICD-10-CM | POA: Diagnosis present

## 2023-06-17 DIAGNOSIS — E873 Alkalosis: Secondary | ICD-10-CM | POA: Diagnosis present

## 2023-06-17 DIAGNOSIS — I2510 Atherosclerotic heart disease of native coronary artery without angina pectoris: Secondary | ICD-10-CM | POA: Diagnosis present

## 2023-06-17 DIAGNOSIS — D500 Iron deficiency anemia secondary to blood loss (chronic): Secondary | ICD-10-CM | POA: Diagnosis present

## 2023-06-17 DIAGNOSIS — Z8249 Family history of ischemic heart disease and other diseases of the circulatory system: Secondary | ICD-10-CM

## 2023-06-17 DIAGNOSIS — F0394 Unspecified dementia, unspecified severity, with anxiety: Secondary | ICD-10-CM | POA: Diagnosis present

## 2023-06-17 DIAGNOSIS — Z8673 Personal history of transient ischemic attack (TIA), and cerebral infarction without residual deficits: Secondary | ICD-10-CM

## 2023-06-17 DIAGNOSIS — R55 Syncope and collapse: Secondary | ICD-10-CM | POA: Diagnosis not present

## 2023-06-17 DIAGNOSIS — Z79890 Hormone replacement therapy (postmenopausal): Secondary | ICD-10-CM

## 2023-06-17 DIAGNOSIS — Z515 Encounter for palliative care: Secondary | ICD-10-CM

## 2023-06-17 DIAGNOSIS — Z66 Do not resuscitate: Secondary | ICD-10-CM | POA: Diagnosis present

## 2023-06-17 DIAGNOSIS — I129 Hypertensive chronic kidney disease with stage 1 through stage 4 chronic kidney disease, or unspecified chronic kidney disease: Secondary | ICD-10-CM | POA: Diagnosis present

## 2023-06-17 DIAGNOSIS — Z881 Allergy status to other antibiotic agents status: Secondary | ICD-10-CM

## 2023-06-17 DIAGNOSIS — E039 Hypothyroidism, unspecified: Secondary | ICD-10-CM | POA: Diagnosis present

## 2023-06-17 LAB — CBC WITH DIFFERENTIAL/PLATELET
Abs Immature Granulocytes: 0.06 10*3/uL (ref 0.00–0.07)
Basophils Absolute: 0 10*3/uL (ref 0.0–0.1)
Basophils Relative: 0 %
Eosinophils Absolute: 0.2 10*3/uL (ref 0.0–0.5)
Eosinophils Relative: 2 %
HCT: 32.7 % — ABNORMAL LOW (ref 39.0–52.0)
Hemoglobin: 10.8 g/dL — ABNORMAL LOW (ref 13.0–17.0)
Immature Granulocytes: 1 %
Lymphocytes Relative: 12 %
Lymphs Abs: 1.4 10*3/uL (ref 0.7–4.0)
MCH: 30.9 pg (ref 26.0–34.0)
MCHC: 33 g/dL (ref 30.0–36.0)
MCV: 93.4 fL (ref 80.0–100.0)
Monocytes Absolute: 0.9 10*3/uL (ref 0.1–1.0)
Monocytes Relative: 9 %
Neutro Abs: 8.3 10*3/uL — ABNORMAL HIGH (ref 1.7–7.7)
Neutrophils Relative %: 76 %
Platelets: 245 10*3/uL (ref 150–400)
RBC: 3.5 MIL/uL — ABNORMAL LOW (ref 4.22–5.81)
RDW: 13.3 % (ref 11.5–15.5)
WBC: 10.9 10*3/uL — ABNORMAL HIGH (ref 4.0–10.5)
nRBC: 0 % (ref 0.0–0.2)

## 2023-06-17 LAB — URINALYSIS, ROUTINE W REFLEX MICROSCOPIC
Bilirubin Urine: NEGATIVE
Glucose, UA: NEGATIVE mg/dL
Hgb urine dipstick: NEGATIVE
Ketones, ur: 20 mg/dL — AB
Leukocytes,Ua: NEGATIVE
Nitrite: NEGATIVE
Protein, ur: NEGATIVE mg/dL
Specific Gravity, Urine: 1.018 (ref 1.005–1.030)
pH: 5 (ref 5.0–8.0)

## 2023-06-17 LAB — BASIC METABOLIC PANEL
Anion gap: 12 (ref 5–15)
BUN: 25 mg/dL — ABNORMAL HIGH (ref 8–23)
CO2: 21 mmol/L — ABNORMAL LOW (ref 22–32)
Calcium: 8.6 mg/dL — ABNORMAL LOW (ref 8.9–10.3)
Chloride: 103 mmol/L (ref 98–111)
Creatinine, Ser: 1.75 mg/dL — ABNORMAL HIGH (ref 0.61–1.24)
GFR, Estimated: 36 mL/min — ABNORMAL LOW (ref >60)
Glucose, Bld: 171 mg/dL — ABNORMAL HIGH (ref 70–99)
Potassium: 4.9 mmol/L (ref 3.5–5.1)
Sodium: 136 mmol/L (ref 135–145)

## 2023-06-17 LAB — TROPONIN I (HIGH SENSITIVITY)
Troponin I (High Sensitivity): 6 ng/L (ref <18)
Troponin I (High Sensitivity): 4 ng/L (ref <18)

## 2023-06-17 MED ORDER — ONDANSETRON HCL 4 MG/2ML IJ SOLN: 4.0000 mg | Freq: Four times a day (QID) | INTRAMUSCULAR | Status: DC | PRN

## 2023-06-17 MED ORDER — ACETAMINOPHEN 325 MG PO TABS: 650.0000 mg | ORAL_TABLET | Freq: Four times a day (QID) | ORAL | Status: DC | PRN

## 2023-06-17 MED ORDER — ONDANSETRON HCL 4 MG PO TABS: 4.0000 mg | ORAL_TABLET | Freq: Four times a day (QID) | ORAL | Status: DC | PRN

## 2023-06-17 MED ORDER — LACTATED RINGERS IV SOLN: INTRAVENOUS | Status: AC

## 2023-06-17 MED ORDER — ACETAMINOPHEN 650 MG RE SUPP: 650.0000 mg | Freq: Four times a day (QID) | RECTAL | Status: DC | PRN

## 2023-06-17 MED ORDER — SODIUM CHLORIDE 0.9% FLUSH
3.0000 mL | Freq: Two times a day (BID) | INTRAVENOUS | Status: DC
  Administered 2023-06-18 – 2023-06-19 (×4): 3 mL via INTRAVENOUS

## 2023-06-17 MED ORDER — APIXABAN 2.5 MG PO TABS
2.5000 mg | ORAL_TABLET | Freq: Two times a day (BID) | ORAL | Status: DC
  Administered 2023-06-18: 2.5 mg via ORAL
  Filled 2023-06-17: qty 1

## 2023-06-17 MED ORDER — LEVOTHYROXINE SODIUM 88 MCG PO TABS
88.0000 ug | ORAL_TABLET | Freq: Every day | ORAL | Status: DC
  Administered 2023-06-18 – 2023-06-19 (×2): 88 ug via ORAL
  Filled 2023-06-17 (×2): qty 1

## 2023-06-17 MED ORDER — BRIMONIDINE TARTRATE 0.2 % OP SOLN
1.0000 [drp] | Freq: Two times a day (BID) | OPHTHALMIC | Status: DC
  Administered 2023-06-18 – 2023-06-19 (×2): 1 [drp] via OPHTHALMIC
  Filled 2023-06-17: qty 5

## 2023-06-17 MED ORDER — PAROXETINE HCL 10 MG PO TABS
10.0000 mg | ORAL_TABLET | Freq: Every day | ORAL | Status: DC
  Administered 2023-06-18 – 2023-06-19 (×2): 10 mg via ORAL
  Filled 2023-06-17 (×2): qty 1

## 2023-06-17 NOTE — ED Provider Notes (Signed)
[facility] Provider Note    Event Date/Time   First MD Initiated Contact with Patient 06/17/23 1744     (approximate)   History   Hypotension   HPI  Roger Cook is a 89 y.o. male with a history of dementia, CKD, hypothyroidism, hyperlipidemia, and CAD who presents with weakness, syncope, and hypotension.  Per EMS, the patient syncopized and was helped to the ground.  He did not hit his head.  Initial BP was 60/30.  His BP has now returned to baseline after 400 mL of fluids given en route.  The patient denies any acute complaints.  I reviewed the past medical records.  The patient was admitted to the hospitalist service earlier this month with syncope and anemia.   Physical Exam   Triage Vital Signs: ED Triage Vitals  Encounter Vitals Group     BP 06/17/23 1747 (!) 151/51     Systolic BP Percentile --      Diastolic BP Percentile --      Pulse Rate 06/17/23 1747 68     Resp 06/17/23 1747 16     Temp 06/17/23 1747 98.4 F (36.9 C)     Temp Source 06/17/23 1747 Oral     SpO2 06/17/23 1747 99 %     Weight 06/17/23 1749 135 lb 2.3 oz (61.3 kg)     Height 06/17/23 1749 5\' 7"  (1.702 m)     Head Circumference --      Peak Flow --      Pain Score 06/17/23 1749 0     Pain Loc --      Pain Education --      Exclude from Growth Chart --     Most recent vital signs: Vitals:   06/17/23 2230 06/17/23 2300  BP: (!) 150/51 (!) 146/56  Pulse: 70 71  Resp: (!) 24 20  Temp:    SpO2: 97% 99%     General: Alert, oriented x 2, no distress.  CV:  Good peripheral perfusion.  Resp:  Normal effort.  Abd:  Soft and nontender.  No distention.  Other:  EOMI.  PERRLA.  No facial droop.  Normal speech.  Motor intact in all extremities.  Slightly dry mucous membranes.   ED Results / Procedures / Treatments   Labs (all labs ordered are listed, but only abnormal results are displayed) Labs Reviewed  BASIC METABOLIC PANEL - Abnormal; Notable for the  following components:      Result Value   CO2 21 (*)    Glucose, Bld 171 (*)    BUN 25 (*)    Creatinine, Ser 1.75 (*)    Calcium 8.6 (*)    GFR, Estimated 36 (*)    All other components within normal limits  CBC WITH DIFFERENTIAL/PLATELET - Abnormal; Notable for the following components:   WBC 10.9 (*)    RBC 3.50 (*)    Hemoglobin 10.8 (*)    HCT 32.7 (*)    Neutro Abs 8.3 (*)    All other components within normal limits  URINALYSIS, ROUTINE W REFLEX MICROSCOPIC - Abnormal; Notable for the following components:   Color, Urine YELLOW (*)    APPearance HAZY (*)    Ketones, ur 20 (*)    All other components within normal limits  TSH  T4, FREE  OCCULT BLOOD X 1 CARD TO LAB, STOOL  COMPREHENSIVE METABOLIC PANEL  CBC  MAGNESIUM  MAGNESIUM  BLOOD GAS, VENOUS  TROPONIN I (  HIGH SENSITIVITY)  TROPONIN I (HIGH SENSITIVITY)     EKG  ED ECG REPORT I, Dionne Bucy, the attending physician, personally viewed and interpreted this ECG.  Date: 06/17/2023 EKG Time: 1743 Rate: 71 Rhythm: normal sinus rhythm (incorrectly read by machine as accelerated junctional rhythm) QRS Axis: normal Intervals: normal ST/T Wave abnormalities: Nonspecific T wave abnormalities Narrative Interpretation: no evidence of acute ischemia    RADIOLOGY  CT head: I independently viewed and interpreted the images; there is no ICH.  Radiology report indicates no acute abnormalities.   PROCEDURES:  Critical Care performed: No  Procedures   MEDICATIONS ORDERED IN ED: Medications  brimonidine (ALPHAGAN) 0.2 % ophthalmic solution 1 drop (has no administration in time range)  apixaban (ELIQUIS) tablet 2.5 mg (has no administration in time range)  levothyroxine (SYNTHROID) tablet 88 mcg (has no administration in time range)  PARoxetine (PAXIL) tablet 10 mg (has no administration in time range)  sodium chloride flush (NS) 0.9 % injection 3 mL (3 mLs Intravenous Given 06/18/23 0015)  lactated  ringers infusion (has no administration in time range)  acetaminophen (TYLENOL) tablet 650 mg (has no administration in time range)    Or  acetaminophen (TYLENOL) suppository 650 mg (has no administration in time range)  ondansetron (ZOFRAN) tablet 4 mg (has no administration in time range)    Or  ondansetron (ZOFRAN) injection 4 mg (has no administration in time range)     IMPRESSION / MDM / ASSESSMENT AND PLAN / ED COURSE  I reviewed the triage vital signs and the nursing notes.  89 year old male with PMH as noted above presents with syncope and hypotension, now resolved.  He has had several prior episodes like this and was admitted for similar episode earlier this month.  He is currently asymptomatic with blood pressure back to baseline.  Other vital signs are normal.  Neurologic exam is nonfocal.  There is no visible trauma.  Differential diagnosis includes, but is not limited to, episode, orthostatic hypotension, dehydration, electrolyte abnormality, hypoglycemia, other metabolic cause, anemia, cardiac dysrhythmia, infection, less likely CNS etiology.  We will obtain basic labs, troponin, urinalysis, CT head, and reassess.  Patient's presentation is most consistent with acute presentation with potential threat to life or bodily function.  The patient is on the cardiac monitor to evaluate for evidence of arrhythmia and/or significant heart rate changes.  ----------------------------------------- 10:42 PM on 06/17/2023 -----------------------------------------   CT head is negative.  Lab workup is unremarkable.  Hemoglobin is stable.  There is no evidence of UTI.  Troponin is negative.  After discussion with family, we will admit to the hospitalist service for further workup and monitoring.  I consulted Dr. Allena Katz from the hospitalist service; based our discussion she agrees to evaluate the patient for admission.  FINAL CLINICAL IMPRESSION(S) / ED DIAGNOSES   Final diagnoses:   Syncope, unspecified syncope type     Rx / DC Orders   ED Discharge Orders     None        Note:  This document was prepared using Dragon voice recognition software and may include unintentional dictation errors.    Dionne Bucy, MD 06/18/23 Jacinta Shoe

## 2023-06-17 NOTE — H&P (Incomplete)
History and Physical    Patient: Roger Cook DOB: Jun 14, 1927 DOA: 06/17/2023 DOS: the patient was seen and examined on 06/18/2023 PCP: [name], MD  Patient coming from: Home   Chief Complaint:  Chief Complaint  Patient presents with   Hypotension    HPI: Roger Cook is a 89 y.o. male with medical history significant for dementia, hypertension, hyperlipidemia, hypothyroid, history of TIA, history of prostate cancer, acute blood loss anemia and GI bleed attributed to known history of AVMs on colonoscopy that was done in 2023 and EGD done in feb 2024 was normal - Dr.Locklear.Patient also diagnosed with severe bilateral pulmonary embolism with right heart strain on Eliquis. since his admission with me on 04/08/2023 pt was admitted on 05/11/23  for syncope while sitting in his chair,  then again on 06/03/23 for AMS and generalized weakness found tob e anemic with hb of 7.7 , then 06/04/23  again for same presentation being d/c on 9/14 after being evlaauted with negative EEG/ ABLA and transfusion  returns today with same presenation of syncope and collapse from hypotension with SBP of 60/30 o2 sat WNL and was given 400 ml LR in en rooute.   Metabolic today shows mild worsening creatinine to 1.75 EGFR of 36 normal LFTs on the 13th BUN at 25. Anemia with a hemoglobin of 10.8 white count of 10.9 normal platelets. Covid negative.  Head ct shows: No CT evidence of acute intracranial abnormality atrophy and small vessel ischemic disease, chronic lacunar infarct in the left, opacified frontal sinus postsurgical changes of the ethmoid sinus. Venous blood gas shows normal Ph, respiratory alkalosis.  Pt at bedside is Alert/ disoriented negative head ct except for his chronic infarct.    Review of Systems: Review of Systems  Unable to perform ROS: Age   Past Medical History:  Diagnosis Date   Anemia    Aortic atherosclerosis (HCC)    Bell palsy    Bilateral renal cysts     Bladder tumor    CAD (coronary artery disease)    DOE (dyspnea on exertion)    Elevated lactic acid level 11/13/2022   GERD (gastroesophageal reflux disease)    History of angina    History of penile implant    History of prostate cancer    Hyperlipidemia    Hypertension    Hypothyroidism    Lipoma    MRSA bacteremia 02/2011   Nephrolithiasis    OSA on CPAP    Pneumonia    as a teenager   Past Surgical History:  Procedure Laterality Date   APPENDECTOMY  2005   CARDIAC CATHETERIZATION N/A 08/13/1990   75% pLCx, 75% mLCx, 25% mLAD, 75% D2, 50% right renal artery; Location: [facility]; Surgeon: Eugenia Pancoast, MD   CATARACT EXTRACTION  2005   also had a macular hole in 2005   CATARACT EXTRACTION  2008   COLONOSCOPY     2003, 2014   COLONOSCOPY WITH PROPOFOL N/A 12/26/2021   Procedure: COLONOSCOPY WITH PROPOFOL;  Surgeon: Midge Minium, MD;  Location: [facility] ENDOSCOPY;  Service: Endoscopy;  Laterality: N/A;   ESOPHAGOGASTRODUODENOSCOPY     2004, 2014   ESOPHAGOGASTRODUODENOSCOPY (EGD) WITH PROPOFOL N/A 12/26/2021   Procedure: ESOPHAGOGASTRODUODENOSCOPY (EGD) WITH PROPOFOL;  Surgeon: Midge Minium, MD;  Location: [facility] ENDOSCOPY;  Service: Endoscopy;  Laterality: N/A;   ESOPHAGOGASTRODUODENOSCOPY (EGD) WITH PROPOFOL N/A 11/15/2022   Procedure: ESOPHAGOGASTRODUODENOSCOPY (EGD) WITH PROPOFOL;  Surgeon: Regis Bill, MD;  Location: [facility] ENDOSCOPY;  Service: Endoscopy;  Laterality: N/A;   HAND SURGERY Left 06/26/2011   Malignancy removed from left hand   NASAL SEPTUM SURGERY  1986   NASAL SINUS SURGERY  1990   penile inplant  1984   polyp of rectum  2003   PROSTATE SURGERY  1975   Abdominal, had to have radiation treatment with the procedure   PROSTATE SURGERY     TONSILLECTOMY     TRANSURETHRAL RESECTION OF BLADDER TUMOR N/A 03/12/2021   Procedure: TRANSURETHRAL RESECTION OF BLADDER TUMOR (TURBT);  Surgeon: Riki Altes, MD;  Location: [facility] ORS;  Service: Urology;  Laterality: N/A;    Social History:   reports that he quit smoking about 60 years ago. His smoking use included cigarettes. He started smoking about 76 years ago. He has a 16 pack-year smoking history. He has never used smokeless tobacco. He reports that he does not currently use alcohol after a past usage of about 2.0 standard drinks of alcohol per week. He reports that he does not use drugs.  Allergies  Allergen Reactions   Iodinated Contrast Media    Levofloxacin Swelling    lips swelling   Povidone Iodine Other (See Comments)    Unknown, can not remember   Sulfa Antibiotics     Unknown, can not remember   Benadryl [Diphenhydramine] Rash    Family History  Problem Relation Age of Onset   Lung cancer Mother    Heart disease Father     Prior to Admission medications   Medication Sig Start Date End Date Taking? Authorizing Provider  acetaminophen (TYLENOL) 325 MG tablet Take 2 tablets (650 mg total) by mouth every 6 (six) hours as needed for mild pain (or Fever >/= 101). 12/09/22   Loyce Dys, MD  amLODipine (NORVASC) 5 MG tablet Take 1 tablet (5 mg total) by mouth daily. 06/11/23   Jacky Kindle, FNP  apixaban (ELIQUIS) 2.5 MG TABS tablet Take 1 tablet (2.5 mg total) by mouth 2 (two) times daily. Reduced dose for for prevention of blood clots, in the setting of likely GI bleed. 04/10/23   Darlin Priestly, MD  Apoaequorin (PREVAGEN) 10 MG CAPS Take 10 mg by mouth daily.    [provider]  ARTIFICIAL TEAR SOLUTION OP Place 1 drop into both eyes 2 (two) times daily as needed (dry eyes).    [provider]  brimonidine (ALPHAGAN) 0.2 % ophthalmic solution Place 1 drop into the left eye 2 (two) times daily. 01/25/21   [provider]  Calcium Carbonate-Vit D-Min (CALCIUM 1200 PO) Take 1,200 mg by mouth daily.    [provider]  cyanocobalamin (VITAMIN B12) 1000 MCG tablet Take 1 tablet (1,000 mcg total) by mouth daily. 11/18/22   Esaw Grandchild A, DO  ferrous sulfate 325  (65 FE) MG tablet Take 1 tablet (325 mg total) by mouth every other day. 01/05/23   [name], MD  isosorbide mononitrate (IMDUR) 30 MG 24 hr tablet Take 0.5 tablets (15 mg total) by mouth daily. 05/12/23   Leeroy Bock, MD  levothyroxine (SYNTHROID) 88 MCG tablet TAKE 1 TABLET EVERY DAY ON EMPTY STOMACHWITH A GLASS OF WATER AT LEAST 30-60 MINBEFORE BREAKFAST 12/24/22   [name], MD  memantine (NAMENDA) 10 MG tablet TAKE 1 TABLET BY MOUTH TWO TIMES DAILY Patient taking differently: Take 10 mg by mouth 2 (two) times daily. 11/19/21   [name], MD  MULTIPLE VITAMIN PO Take 1 tablet by mouth daily.  [provider]  NON FORMULARY CPAP (Free Text) - Historical Medication  As directed  Started 22-Sep-1994 Active 09/22/1994   [provider]  pantoprazole (PROTONIX) 40 MG tablet Take 1 tablet (40 mg total) by mouth 2 (two) times daily. 04/10/23   Darlin Priestly, MD  PARoxetine (PAXIL) 10 MG tablet Take 10 mg by mouth daily.    [provider]     Vitals:   06/17/23 2200 06/17/23 2230 06/17/23 2300 06/18/23 0026  BP: (!) 143/48 (!) 150/51 (!) 146/56 (!) 177/61  Pulse: 74 70 71 64  Resp: 14 (!) 24 20 (!) 21  Temp:      TempSrc:      SpO2: 96% 97% 99% 100%  Weight:      Height:       Physical Exam Vitals and nursing note reviewed.  Constitutional:      Appearance: He is not ill-appearing.  HENT:     Head: Normocephalic and atraumatic.     Right Ear: External ear normal.     Left Ear: External ear normal.  Eyes:     Pupils: Pupils are equal, round, and reactive to light.  Cardiovascular:     Rate and Rhythm: Normal rate and regular rhythm.     Pulses: Normal pulses.     Heart sounds: Normal heart sounds.  Pulmonary:     Effort: Pulmonary effort is normal.     Breath sounds: Normal breath sounds.  Abdominal:     General: Bowel sounds are normal.     Palpations: Abdomen is soft.  Musculoskeletal:     Right lower leg: No edema.     Left  lower leg: No edema.  Skin:    General: Skin is warm.  Neurological:     General: No focal deficit present.     Mental Status: He is alert. He is disoriented.     Cranial Nerves: No cranial nerve deficit.     Sensory: No sensory deficit.     Motor: No weakness or abnormal muscle tone.     Coordination: Finger-Nose-Finger Test normal.     Deep Tendon Reflexes:     Reflex Scores:      Bicep reflexes are 2+ on the right side and 2+ on the left side.      Patellar reflexes are 2+ on the right side and 2+ on the left side. Psychiatric:        Mood and Affect: Mood normal.        Speech: Speech normal.        Behavior: Behavior is cooperative.        Cognition and Memory: Memory is impaired.     Labs on Admission: I have personally reviewed following labs and imaging studies  CBC: Recent Labs  Lab 06/17/23 1754  WBC 10.9*  NEUTROABS 8.3*  HGB 10.8*  HCT 32.7*  MCV 93.4  PLT 245   Basic Metabolic Panel: Recent Labs  Lab 06/17/23 1754  NA 136  K 4.9  CL 103  CO2 21*  GLUCOSE 171*  BUN 25*  CREATININE 1.75*  CALCIUM 8.6*   GFR: Estimated Creatinine Clearance: 22.4 mL/min (A) (by C-G formula based on SCr of 1.75 mg/dL (H)). Liver Function Tests: No results for input(s): "AST", "ALT", "ALKPHOS", "BILITOT", "PROT", "ALBUMIN" in the last 168 hours. No results for input(s): "LIPASE", "AMYLASE" in the last 168 hours. No results for input(s): "AMMONIA" in the last 168 hours. Coagulation Profile: No results for input(s): "INR", "PROTIME"  in the last 168 hours. Cardiac Enzymes: No results for input(s): "CKTOTAL", "CKMB", "CKMBINDEX", "TROPONINI" in the last 168 hours. BNP (last 3 results) No results for input(s): "PROBNP" in the last 8760 hours. HbA1C: No results for input(s): "HGBA1C" in the last 72 hours. CBG: No results for input(s): "GLUCAP" in the last 168 hours. Lipid Profile: No results for input(s): "CHOL", "HDL", "LDLCALC", "TRIG", "CHOLHDL", "LDLDIRECT" in the  last 72 hours. Thyroid Function Tests: No results for input(s): "TSH", "T4TOTAL", "FREET4", "T3FREE", "THYROIDAB" in the last 72 hours. Anemia Panel: No results for input(s): "VITAMINB12", "FOLATE", "FERRITIN", "TIBC", "IRON", "RETICCTPCT" in the last 72 hours. Urinalysis    Component Value Date/Time   COLORURINE YELLOW (A) 06/17/2023 1948   APPEARANCEUR HAZY (A) 06/17/2023 1948   APPEARANCEUR Cloudy (A) 02/28/2021 1454   LABSPEC 1.018 06/17/2023 1948   LABSPEC 1.018 10/21/2012 1747   PHURINE 5.0 06/17/2023 1948   GLUCOSEU NEGATIVE 06/17/2023 1948   GLUCOSEU Negative 10/21/2012 1747   HGBUR NEGATIVE 06/17/2023 1948   BILIRUBINUR NEGATIVE 06/17/2023 1948   BILIRUBINUR Negative 02/28/2021 1454   BILIRUBINUR Negative 10/21/2012 1747   KETONESUR 20 (A) 06/17/2023 1948   PROTEINUR NEGATIVE 06/17/2023 1948   NITRITE NEGATIVE 06/17/2023 1948   LEUKOCYTESUR NEGATIVE 06/17/2023 1948   LEUKOCYTESUR Negative 10/21/2012 1747    Medications  brimonidine (ALPHAGAN) 0.2 % ophthalmic solution 1 drop (has no administration in time range)  apixaban (ELIQUIS) tablet 2.5 mg (has no administration in time range)  levothyroxine (SYNTHROID) tablet 88 mcg (has no administration in time range)  PARoxetine (PAXIL) tablet 10 mg (has no administration in time range)  sodium chloride flush (NS) 0.9 % injection 3 mL (3 mLs Intravenous Given 06/18/23 0015)  lactated ringers infusion (has no administration in time range)  acetaminophen (TYLENOL) tablet 650 mg (has no administration in time range)    Or  acetaminophen (TYLENOL) suppository 650 mg (has no administration in time range)  ondansetron (ZOFRAN) tablet 4 mg (has no administration in time range)    Or  ondansetron (ZOFRAN) injection 4 mg (has no administration in time range)  isosorbide mononitrate (IMDUR) 24 hr tablet 15 mg (has no administration in time range)  amLODipine (NORVASC) tablet 5 mg (has no administration in time range)  hydrALAZINE  (APRESOLINE) injection 5 mg (has no administration in time range)    Radiological Exams on Admission: CT Head Wo Contrast  Result Date: 06/17/2023 CLINICAL DATA:  Mental status change EXAM: CT HEAD WITHOUT CONTRAST TECHNIQUE: Contiguous axial images were obtained from the base of the skull through the vertex without intravenous contrast. RADIATION DOSE REDUCTION: This exam was performed according to the departmental dose-optimization program which includes automated exposure control, adjustment of the mA and/or kV according to patient size and/or use of iterative reconstruction technique. COMPARISON:  CT brain 06/14/2023, 05/12/2023 FINDINGS: Brain: No acute territorial infarction, hemorrhage or intracranial mass. Chronic appearing lacunar infarcts in the left basal ganglia. Atrophy and advanced chronic small vessel ischemic changes of the white matter. Stable ventricle size Vascular: No hyperdense vessels.  Carotid vascular calcification Skull: Normal. Negative for fracture or focal lesion. Sinuses/Orbits: Opacified frontal sinuses postsurgical changes of the ethmoid sinuses Other: None IMPRESSION: 1. No CT evidence for acute intracranial abnormality. 2. Atrophy and advanced chronic small vessel ischemic changes of the white matter. Chronic appearing lacunar infarcts in the left basal ganglia. Electronically Signed   By: Jasmine Pang M.D.   On: 06/17/2023 19:32     Data Reviewed: Relevant notes from primary care  and specialist visits, past discharge summaries as available in EHR, including Care Everywhere. Prior diagnostic testing as pertinent to current admission diagnoses Updated medications and problem lists for reconciliation ED course, including vitals, labs, imaging, treatment and response to treatment Triage notes, nursing and pharmacy notes and ED provider's notes Notable results as noted in HPI  Assessment & Plan Syncope and collapse Patient with history of dizziness recurrent falls  recurrent syncope history of GI bleed, history of anemia history of pulmonary embolism presenting again same presentation with syncope episode found to be hypotensive on initial assessment with systolics of 60/30. Will obtain an MRI of the brain to identify if patient is having strokes, risk of pulmonary embolism is also high in him as he has a history of PE and is on Eliquis will obtain a VQ scan. Fall precaution, aspiration precaution.  Chart review shows patient's last echo in March of this year with an EF of 60 to 65% and normal aortic valve structure.  EKG today shows change junctional rhythm at 71suspect patient has chronic GI blood loss with QRS of 105 and QTc of 449 After evaluation with MRI and VQ scan if negative will consider cardiology consult question if patient needs to have a Holter monitor uncertain if the patient is having dysrhythmias. Hypotension, unspecified Unclear if patient is having hypotension from blood loss or dysrhythmias.  Currently blood pressure stable. Will hold all home medications, clear liquid diet.  Cardiology consulted 2D echocardiogram per a.m. team as deemed appropriate from overnight monitoring and results. Continue patient on LR with cautious and monitored IV fluid hydration. Vitals:   06/17/23 1747 06/17/23 1903 06/17/23 2030 06/17/23 2100  BP: (!) 151/51 (!) 147/57 (!) 148/52 (!) 128/59   06/17/23 2130 06/17/23 2200 06/17/23 2230 06/17/23 2300  BP: (!) 137/54 (!) 143/48 (!) 150/51 (!) 146/56   06/18/23 0026  BP: (!) 177/61  Will start patient on as needed hydralazine and continue patient's amlodipine patient is also on Imdur.  Iron deficiency anemia due to chronic blood loss Suspect patient has chronic GI blood loss that may be potentiated intermittently as he is currently on Eliquis.  Monitors CBC type and screen transfuse as deemed appropriate.  IV PPI. Hypothyroidism Free t4 and TSH Continue patient on levothyroxine at 88 mcg p.o. daily.  Coronary  artery disease Stable, no complaints of chest pain or anginal equivalent presentation.  Troponin is negative. Will continue patient on statin therapy Eliquis, Synthroid, as needed Tylenol. Dementia (HCC) Continue patient on home regimen of Namenda 10 mg twice a day. Bilateral pulmonary embolism (HCC) Pt has BL PE and is on age adjusted eliquis.  V/q scan as this recurrent syncope presentation may be pulmonary embolism.   DVT prophylaxis:  Heparin   Consults:  None   Advance Care Planning:    Code Status: Full Code   Family Communication:  None   Disposition Plan:  Home   Severity of Illness: The appropriate patient status for this patient is INPATIENT. Inpatient status is judged to be reasonable and necessary in order to provide the required intensity of service to ensure the patient's safety. The patient's presenting symptoms, physical exam findings, and initial radiographic and laboratory data in the context of their chronic comorbidities is felt to place them at high risk for further clinical deterioration. Furthermore, it is not anticipated that the patient will be medically stable for discharge from the hospital within 2 midnights of admission.   * I certify that at the point of  admission it is my clinical judgment that the patient will require inpatient hospital care spanning beyond 2 midnights from the point of admission due to high intensity of service, high risk for further deterioration and high frequency of surveillance required.*  Author: Gertha Calkin, MD 06/18/2023 1:27 AM  For on call review [email or web address].

## 2023-06-17 NOTE — ED Triage Notes (Addendum)
Pt to ED via [facility] for hypotension and syncopal episodes. Per EMS, pt was assisted to the ground by home health aid. Upon EMS arrival, pt was laying on the floor and when EMS attempted to sit pt up, pt had another syncopal episode. Per EMS pt's initial BP was 60/30. EMS reports that pt has hx of these events per wife. Pt oriented to self and place, and time at this time. Pt BP at this time 151/51. Pt able to follow commands.   EMS Vitals Most recent BP 88/62 HR 70 SPO2  97% CBG 161   LR given in route

## 2023-06-18 ENCOUNTER — Encounter: Payer: Self-pay | Admitting: Internal Medicine

## 2023-06-18 ENCOUNTER — Observation Stay: Payer: Medicare Other

## 2023-06-18 DIAGNOSIS — E785 Hyperlipidemia, unspecified: Secondary | ICD-10-CM | POA: Diagnosis present

## 2023-06-18 DIAGNOSIS — Z888 Allergy status to other drugs, medicaments and biological substances status: Secondary | ICD-10-CM | POA: Diagnosis not present

## 2023-06-18 DIAGNOSIS — Z7401 Bed confinement status: Secondary | ICD-10-CM | POA: Diagnosis not present

## 2023-06-18 DIAGNOSIS — I959 Hypotension, unspecified: Secondary | ICD-10-CM | POA: Diagnosis present

## 2023-06-18 DIAGNOSIS — Z515 Encounter for palliative care: Secondary | ICD-10-CM | POA: Diagnosis not present

## 2023-06-18 DIAGNOSIS — Z7901 Long term (current) use of anticoagulants: Secondary | ICD-10-CM | POA: Diagnosis not present

## 2023-06-18 DIAGNOSIS — I129 Hypertensive chronic kidney disease with stage 1 through stage 4 chronic kidney disease, or unspecified chronic kidney disease: Secondary | ICD-10-CM | POA: Diagnosis present

## 2023-06-18 DIAGNOSIS — Z8546 Personal history of malignant neoplasm of prostate: Secondary | ICD-10-CM | POA: Diagnosis not present

## 2023-06-18 DIAGNOSIS — R531 Weakness: Secondary | ICD-10-CM | POA: Diagnosis not present

## 2023-06-18 DIAGNOSIS — Z7189 Other specified counseling: Secondary | ICD-10-CM

## 2023-06-18 DIAGNOSIS — E873 Alkalosis: Secondary | ICD-10-CM | POA: Diagnosis present

## 2023-06-18 DIAGNOSIS — F03918 Unspecified dementia, unspecified severity, with other behavioral disturbance: Secondary | ICD-10-CM | POA: Diagnosis present

## 2023-06-18 DIAGNOSIS — R0600 Dyspnea, unspecified: Secondary | ICD-10-CM | POA: Diagnosis not present

## 2023-06-18 DIAGNOSIS — Z8249 Family history of ischemic heart disease and other diseases of the circulatory system: Secondary | ICD-10-CM | POA: Diagnosis not present

## 2023-06-18 DIAGNOSIS — Z881 Allergy status to other antibiotic agents status: Secondary | ICD-10-CM | POA: Diagnosis not present

## 2023-06-18 DIAGNOSIS — I2609 Other pulmonary embolism with acute cor pulmonale: Secondary | ICD-10-CM | POA: Diagnosis not present

## 2023-06-18 DIAGNOSIS — I2699 Other pulmonary embolism without acute cor pulmonale: Secondary | ICD-10-CM | POA: Diagnosis not present

## 2023-06-18 DIAGNOSIS — Z8673 Personal history of transient ischemic attack (TIA), and cerebral infarction without residual deficits: Secondary | ICD-10-CM | POA: Diagnosis not present

## 2023-06-18 DIAGNOSIS — I10 Essential (primary) hypertension: Secondary | ICD-10-CM | POA: Diagnosis not present

## 2023-06-18 DIAGNOSIS — F03B0 Unspecified dementia, moderate, without behavioral disturbance, psychotic disturbance, mood disturbance, and anxiety: Secondary | ICD-10-CM

## 2023-06-18 DIAGNOSIS — Z91041 Radiographic dye allergy status: Secondary | ICD-10-CM | POA: Diagnosis not present

## 2023-06-18 DIAGNOSIS — Z86711 Personal history of pulmonary embolism: Secondary | ICD-10-CM | POA: Diagnosis not present

## 2023-06-18 DIAGNOSIS — I2510 Atherosclerotic heart disease of native coronary artery without angina pectoris: Secondary | ICD-10-CM | POA: Diagnosis present

## 2023-06-18 DIAGNOSIS — D500 Iron deficiency anemia secondary to blood loss (chronic): Secondary | ICD-10-CM

## 2023-06-18 DIAGNOSIS — Z66 Do not resuscitate: Secondary | ICD-10-CM | POA: Diagnosis present

## 2023-06-18 DIAGNOSIS — F0394 Unspecified dementia, unspecified severity, with anxiety: Secondary | ICD-10-CM | POA: Diagnosis present

## 2023-06-18 DIAGNOSIS — E039 Hypothyroidism, unspecified: Secondary | ICD-10-CM | POA: Diagnosis present

## 2023-06-18 DIAGNOSIS — N1832 Chronic kidney disease, stage 3b: Secondary | ICD-10-CM | POA: Diagnosis present

## 2023-06-18 DIAGNOSIS — R55 Syncope and collapse: Secondary | ICD-10-CM | POA: Diagnosis not present

## 2023-06-18 DIAGNOSIS — Z87891 Personal history of nicotine dependence: Secondary | ICD-10-CM | POA: Diagnosis not present

## 2023-06-18 DIAGNOSIS — Z79890 Hormone replacement therapy (postmenopausal): Secondary | ICD-10-CM | POA: Diagnosis not present

## 2023-06-18 DIAGNOSIS — I700 Atherosclerosis of aorta: Secondary | ICD-10-CM | POA: Diagnosis present

## 2023-06-18 LAB — CBC
HCT: 31.3 % — ABNORMAL LOW (ref 39.0–52.0)
Hemoglobin: 10.3 g/dL — ABNORMAL LOW (ref 13.0–17.0)
MCH: 31.2 pg (ref 26.0–34.0)
MCHC: 32.9 g/dL (ref 30.0–36.0)
MCV: 94.8 fL (ref 80.0–100.0)
Platelets: 220 10*3/uL (ref 150–400)
RBC: 3.3 MIL/uL — ABNORMAL LOW (ref 4.22–5.81)
RDW: 13.1 % (ref 11.5–15.5)
WBC: 7.8 10*3/uL (ref 4.0–10.5)
nRBC: 0 % (ref 0.0–0.2)

## 2023-06-18 LAB — TSH: TSH: 2.78 u[IU]/mL (ref 0.350–4.500)

## 2023-06-18 LAB — MAGNESIUM
Magnesium: 2.2 mg/dL (ref 1.7–2.4)
Magnesium: 2.3 mg/dL (ref 1.7–2.4)

## 2023-06-18 LAB — BLOOD GAS, VENOUS
Acid-Base Excess: 4.3 mmol/L — ABNORMAL HIGH (ref 0.0–2.0)
Bicarbonate: 24.9 mmol/L (ref 20.0–28.0)
O2 Saturation: 94.7 %
Patient temperature: 37
pCO2, Ven: 26 mmHg — ABNORMAL LOW (ref 44–60)
pH, Ven: 7.59 — ABNORMAL HIGH (ref 7.25–7.43)
pO2, Ven: 61 mmHg — ABNORMAL HIGH (ref 32–45)

## 2023-06-18 LAB — COMPREHENSIVE METABOLIC PANEL
ALT: 17 U/L (ref 0–44)
AST: 22 U/L (ref 15–41)
Albumin: 3.3 g/dL — ABNORMAL LOW (ref 3.5–5.0)
Alkaline Phosphatase: 57 U/L (ref 38–126)
Anion gap: 10 (ref 5–15)
BUN: 25 mg/dL — ABNORMAL HIGH (ref 8–23)
CO2: 21 mmol/L — ABNORMAL LOW (ref 22–32)
Calcium: 8.7 mg/dL — ABNORMAL LOW (ref 8.9–10.3)
Chloride: 106 mmol/L (ref 98–111)
Creatinine, Ser: 1.38 mg/dL — ABNORMAL HIGH (ref 0.61–1.24)
GFR, Estimated: 47 mL/min — ABNORMAL LOW (ref >60)
Glucose, Bld: 109 mg/dL — ABNORMAL HIGH (ref 70–99)
Potassium: 3.5 mmol/L (ref 3.5–5.1)
Sodium: 137 mmol/L (ref 135–145)
Total Bilirubin: 0.8 mg/dL (ref 0.3–1.2)
Total Protein: 6.2 g/dL — ABNORMAL LOW (ref 6.5–8.1)

## 2023-06-18 LAB — T4, FREE: Free T4: 1.65 ng/dL — ABNORMAL HIGH (ref 0.61–1.12)

## 2023-06-18 MED ORDER — HALOPERIDOL LACTATE 5 MG/ML IJ SOLN: 2.0000 mg | Freq: Four times a day (QID) | INTRAMUSCULAR | Status: DC | PRN

## 2023-06-18 MED ORDER — ISOSORBIDE MONONITRATE ER 30 MG PO TB24
15.0000 mg | ORAL_TABLET | Freq: Every day | ORAL | Status: DC
  Administered 2023-06-18 – 2023-06-19 (×2): 15 mg via ORAL
  Filled 2023-06-18 (×2): qty 1

## 2023-06-18 MED ORDER — HYDRALAZINE HCL 20 MG/ML IJ SOLN
5.0000 mg | INTRAMUSCULAR | Status: DC | PRN
  Administered 2023-06-18: 5 mg via INTRAVENOUS
  Filled 2023-06-18: qty 1

## 2023-06-18 MED ORDER — APIXABAN 5 MG PO TABS
5.0000 mg | ORAL_TABLET | Freq: Two times a day (BID) | ORAL | Status: DC
  Administered 2023-06-18 – 2023-06-19 (×2): 5 mg via ORAL
  Filled 2023-06-18 (×2): qty 1

## 2023-06-18 MED ORDER — TECHNETIUM TO 99M ALBUMIN AGGREGATED
4.0000 | Freq: Once | INTRAVENOUS | Status: AC | PRN
  Administered 2023-06-18: 4.2 via INTRAVENOUS

## 2023-06-18 MED ORDER — LORAZEPAM 2 MG/ML IJ SOLN
1.0000 mg | INTRAMUSCULAR | Status: DC | PRN
  Administered 2023-06-18: 1 mg via INTRAVENOUS
  Filled 2023-06-18: qty 1

## 2023-06-18 MED ORDER — AMLODIPINE BESYLATE 5 MG PO TABS
5.0000 mg | ORAL_TABLET | Freq: Every day | ORAL | Status: DC
  Administered 2023-06-18 – 2023-06-19 (×2): 5 mg via ORAL
  Filled 2023-06-18 (×2): qty 1

## 2023-06-18 NOTE — Progress Notes (Signed)
1      PROGRESS NOTE    Roger Cook  BMW:[id number] DOB: 02/11/1927 DOA: 06/17/2023 PCP: [name], MD    Brief Narrative:   89 y.o. male with medical history significant for dementia, hypertension, hyperlipidemia, hypothyroid, history of TIA, history of prostate cancer, acute blood loss anemia and GI bleed attributed to known history of AVMs on colonoscopy that was done in 2023 and EGD done in feb 2024 was normal - Dr.Locklear.Patient also diagnosed with severe bilateral pulmonary embolism with right heart strain on Eliquis. since his admission with me on 04/08/2023 pt was admitted on 05/11/23  for syncope while sitting in his chair, then again on 06/03/23 for AMS and generalized weakness found tob e anemic with hb of 7.7 , then 06/04/23  again for same presentation being d/c on 9/14 after being evlaauted with negative EEG/ ABLA and transfusion  returns today with same presenation of syncope and collapse from hypotension with SBP of 60/30 o2 sat WNL and was given 400 ml LR in en rooute.    In total, reviewing [facility], he is showing 6 readmissions and 2 ED visits in last 64-months  9/26: VQ scan concerning for PE, palliative care consult.  Family leaning towards comfort care/hospice at home   Assessment & Plan:   Principal Problem:   Syncope and collapse Active Problems:   Syncope   Hypotension, unspecified   Iron deficiency anemia due to chronic blood loss   Bilateral pulmonary embolism (HCC)   Hypothyroidism   Coronary artery disease   Dementia (HCC)  *Recurrent syncope Patient has had recurrent falls due to syncope.  Likely multifactorial.  Will hold off further workup at this time.  CT head negative for acute pathology  Dementia with behavioral disturbances He has 24/7 caregivers at home.  He is requiring more assistance with his ADLs.  Palliative care consult for goals of care conversation I had discussion with his daughter who is in agreement to have comfort care/hospice at  home but wanted make sure co-POA/Barbara/stepmother is on board   Anemia requiring transfusions Current hemoglobin 10.3.  Continue to monitor.  Watch for signs of bleeding.   Essential hypertension Continue amlodipine, Imdur and as needed hydralazine   History of pulmonary embolism Repeat VQ scan on this admission shows acute PE Patient is already on Eliquis but was on 2.5 mg twice daily.  Will change to full therapeutic dose of 5 mg p.o. twice daily   Hypothyroidism On Synthroid, normal TSH   CKD stage 3b, GFR 30-44 ml/min (HCC) Creatinine at baseline.   Hyperlipidemia, unspecified Hold statin   Anxiety On Paxil   Sleep apnea On CPAP at night   DVT prophylaxis:   apixaban (ELIQUIS) tablet 5 mg     Code Status: DNR Family Communication: Had discussion with his daughter/[name] at bedside Disposition Plan: Likely home with hospice in next 1 to 2 days if family is on board   Consultants:  Palliative care    Subjective: Patient sleeping comfortably.  Pleasantly confused, daughter at bedside  Objective: Vitals:   06/18/23 0915 06/18/23 1000 06/18/23 1158 06/18/23 1429  BP: (!) 157/54 (!) 163/53  (!) 148/47  Pulse: 74 70  78  Resp: 15 12  16   Temp:   98 F (36.7 C) 97.6 F (36.4 C)  TempSrc:      SpO2: 100% 100%  97%  Weight:      Height:       No intake or output data in  the 24 hours ending 06/18/23 1502 Filed Weights   06/17/23 1749  Weight: 61.3 kg    Examination:  General exam: Appears calm and comfortable  Respiratory system: Clear to auscultation. Respiratory effort normal. Cardiovascular system: S1 & S2 heard, RRR. No JVD, murmurs, rubs, gallops or clicks. No pedal edema. Gastrointestinal system: Abdomen is nondistended, soft and nontender. No organomegaly or masses felt. Normal bowel sounds heard. Central nervous system: Alert and awake. No focal neurological deficits. Extremities: Symmetric 5 x 5 power. Skin: No rashes, lesions or  ulcers Psychiatry: Difficult evaluate due to his underlying dementia    Data Reviewed: I have personally reviewed following labs and imaging studies  CBC: Recent Labs  Lab 06/17/23 1754 06/18/23 0340  WBC 10.9* 7.8  NEUTROABS 8.3*  --   HGB 10.8* 10.3*  HCT 32.7* 31.3*  MCV 93.4 94.8  PLT 245 220   Basic Metabolic Panel: Recent Labs  Lab 06/17/23 1754 06/18/23 0340  NA 136 137  K 4.9 3.5  CL 103 106  CO2 21* 21*  GLUCOSE 171* 109*  BUN 25* 25*  CREATININE 1.75* 1.38*  CALCIUM 8.6* 8.7*  MG  --  2.3  2.2   GFR: Estimated Creatinine Clearance: 28.4 mL/min (A) (by C-G formula based on SCr of 1.38 mg/dL (H)). Liver Function Tests: Recent Labs  Lab 06/18/23 0340  AST 22  ALT 17  ALKPHOS 57  BILITOT 0.8  PROT 6.2*  ALBUMIN 3.3*    Thyroid Function Tests: Recent Labs    06/18/23 0340  TSH 2.780  FREET4 1.65*      Radiology Studies: NM Pulmonary Perfusion  Result Date: 06/18/2023 CLINICAL DATA:  Evaluate for pulmonary embolism.  High probability. EXAM: NUCLEAR MEDICINE PERFUSION LUNG SCAN TECHNIQUE: Perfusion images were obtained in multiple projections after intravenous injection of radiopharmaceutical. Ventilation scans intentionally deferred if perfusion scan and chest x-ray adequate for interpretation during COVID 19 epidemic. RADIOPHARMACEUTICALS:  4.2 mCi Tc-37m MAA IV COMPARISON:  Chest radiograph from earlier today FINDINGS: There is mild heterogeneous radiotracer activity identified within both lungs. Bilateral mid lung peripheral segmental perfusion defects are identified which appear moderate. No additional segmental perfusion defects identified. IMPRESSION: 1. Bilateral mid lung peripheral segmental perfusion defects compatible with acute pulmonary embolism. Electronically Signed   By: Signa Kell M.D.   On: 06/18/2023 11:53   DG Chest Port 1 View  Result Date: 06/18/2023 CLINICAL DATA:  Dyspnea. EXAM: PORTABLE CHEST 1 VIEW COMPARISON:   June 04, 2023. FINDINGS: The heart size and mediastinal contours are within normal limits. Both lungs are clear. The visualized skeletal structures are unremarkable. IMPRESSION: No active disease. Electronically Signed   By: Lupita Raider M.D.   On: 06/18/2023 09:27   CT Head Wo Contrast  Result Date: 06/17/2023 CLINICAL DATA:  Mental status change EXAM: CT HEAD WITHOUT CONTRAST TECHNIQUE: Contiguous axial images were obtained from the base of the skull through the vertex without intravenous contrast. RADIATION DOSE REDUCTION: This exam was performed according to the departmental dose-optimization program which includes automated exposure control, adjustment of the mA and/or kV according to patient size and/or use of iterative reconstruction technique. COMPARISON:  CT brain 06/14/2023, 05/12/2023 FINDINGS: Brain: No acute territorial infarction, hemorrhage or intracranial mass. Chronic appearing lacunar infarcts in the left basal ganglia. Atrophy and advanced chronic small vessel ischemic changes of the white matter. Stable ventricle size Vascular: No hyperdense vessels.  Carotid vascular calcification Skull: Normal. Negative for fracture or focal lesion. Sinuses/Orbits: Opacified frontal sinuses postsurgical  changes of the ethmoid sinuses Other: None IMPRESSION: 1. No CT evidence for acute intracranial abnormality. 2. Atrophy and advanced chronic small vessel ischemic changes of the white matter. Chronic appearing lacunar infarcts in the left basal ganglia. Electronically Signed   By: Jasmine Pang M.D.   On: 06/17/2023 19:32        Scheduled Meds:  amLODipine  5 mg Oral Daily   apixaban  5 mg Oral BID   brimonidine  1 drop Left Eye BID   isosorbide mononitrate  15 mg Oral Daily   levothyroxine  88 mcg Oral Q0600   PARoxetine  10 mg Oral Daily   sodium chloride flush  3 mL Intravenous Q12H   Continuous Infusions:   LOS: 0 days    Time spent: 35 minutes    Leotis Isham Sherryll Burger, MD [facility] Pager [phone]  If 7PM-7AM, please contact night-coverage [email or web address] Password [email or web address] 06/18/2023, 3:02 PM

## 2023-06-18 NOTE — Assessment & Plan Note (Signed)
Suspect patient has chronic GI blood loss that may be potentiated intermittently as he is currently on Eliquis.  Monitors CBC type and screen transfuse as deemed appropriate.  IV PPI.

## 2023-06-18 NOTE — Assessment & Plan Note (Signed)
Free t4 and TSH Continue patient on levothyroxine at 88 mcg p.o. daily.

## 2023-06-18 NOTE — ED Notes (Signed)
Condom cath placed on patient

## 2023-06-18 NOTE — Assessment & Plan Note (Signed)
Continue patient on home regimen of Namenda 10 mg twice a day.

## 2023-06-18 NOTE — TOC Initial Note (Signed)
Transition of Care [facility]) - Initial/Assessment Note    Patient Details  Name: Roger Cook MRN: [id number] Date of Birth: [birth year]  Transition of Care [facility]) CM/SW Contact:    Allena Katz, LCSW Phone Number: 06/18/2023, 3:53 PM  Clinical Narrative:    Pt admitted with syncope. Pt requesting to be discharged home with home hospice. Choice was offered by palliative and family would like to use [facility] for home hospice.    Expected Discharge Plan: Home w Hospice Care Barriers to Discharge: Continued Medical Work up   Patient Goals and CMS Choice            Expected Discharge Plan and Services                                              Prior Living Arrangements/Services                       Activities of Daily Living   ADL Screening (condition at time of admission) Does the patient have a NEW difficulty with bathing/dressing/toileting/self-feeding that is expected to last >3 days?: Yes (Initiates electronic notice to provider for possible OT consult) Does the patient have a NEW difficulty with getting in/out of bed, walking, or climbing stairs that is expected to last >3 days?: Yes (Initiates electronic notice to provider for possible PT consult) Does the patient have a NEW difficulty with communication that is expected to last >3 days?: Yes (Initiates electronic notice to provider for possible SLP consult) Is the patient deaf or have difficulty hearing?: Yes Does the patient have difficulty seeing, even when wearing glasses/contacts?: No Does the patient have difficulty concentrating, remembering, or making decisions?: Yes  Permission Sought/Granted                  Emotional Assessment              Admission diagnosis:  Syncope and collapse [R55] Syncope, unspecified syncope type [R55] Syncope [R55] Patient Active Problem List   Diagnosis Date Noted   Anemia due to stage 3b chronic kidney disease (HCC) 06/11/2023   History  of pulmonary embolism 06/04/2023   CKD stage 3b, GFR 30-44 ml/min (HCC) 06/04/2023   Acute delirium 06/04/2023   Hyperlipidemia, unspecified 06/03/2023   Anxiety 06/03/2023   Dementia with behavioral disturbance (HCC) 06/03/2023   Coronary artery disease 06/03/2023   Acute kidney injury superimposed on chronic kidney disease (HCC) 06/03/2023   Dehydration 05/12/2023   Hypotension, unspecified 05/12/2023   Syncope and collapse 05/12/2023   Depression 05/11/2023   Gastrointestinal hemorrhage associated with duodenitis 04/23/2023   Postural dizziness with near syncope 04/08/2023   Hospital discharge follow-up 01/29/2023   History of bradycardia 01/29/2023   History of syncope 01/29/2023   Transient hypotension 01/29/2023   AMS (altered mental status) 01/24/2023   Sinus bradycardia 01/24/2023   Bilateral pulmonary embolism (HCC) 12/06/2022   Vitamin B12 deficiency 11/17/2022   Generalized weakness 11/17/2022   Positive blood culture 11/15/2022   Chronic anemia 11/13/2022   Hyperglycemia 07/09/2022   Iron deficiency anemia due to chronic blood loss    Angiodysplasia of intestinal tract    Anemia requiring transfusions 12/24/2021   Angina pectoris (HCC) 09/18/2020   Dementia (HCC) 09/18/2020   Renal hematoma, right, subsequent encounter 04/25/2018   Localized osteoporosis of spine 12/23/2017   Constipation 11/06/2016  Bell's palsy 02/26/2015   Nephrogenic adenoma of bladder 02/26/2015   Folliculitis 02/26/2015   Personal history of methicillin resistant Staphylococcus aureus 02/26/2015   Fatty tumor 02/26/2015   Syncope 02/26/2015   Fungal infection of nail 02/26/2015   H/O malignant neoplasm of prostate 02/26/2015   Melena 10/18/2012   Arteriosclerosis of coronary artery 01/05/2009   Sleep apnea 01/05/2009   Hypothyroidism 01/05/2009   Essential hypertension 09/22/1998   PCP:  [name], MD Pharmacy:   [facility] - [place], [place] - [address] [place] [address] Phone: [phone] Fax: [phone]     Social Determinants of Health (SDOH) Social History: SDOH Screenings   Food Insecurity: No Food Insecurity (06/18/2023)  Housing: Patient Unable To Answer (06/18/2023)  Transportation Needs: No Transportation Needs (06/18/2023)  Utilities: Not At Risk (06/18/2023)  Alcohol Screen: Low Risk  (06/01/2023)  Depression (PHQ2-9): Low Risk  (06/01/2023)  Financial Resource Strain: Low Risk  (06/01/2023)  Physical Activity: Inactive (06/01/2023)  Social Connections: Socially Isolated (06/01/2023)  Stress: No Stress Concern Present (06/01/2023)  Tobacco Use: Medium Risk (06/18/2023)  Health Literacy: Adequate Health Literacy (06/01/2023)   SDOH Interventions:     Readmission Risk Interventions    06/03/2023   10:35 AM 04/09/2023   10:35 AM 01/26/2023    3:59 PM  Readmission Risk Prevention Plan  Transportation Screening Complete Complete Complete  Medication Review (RN Care Manager) Complete Complete Complete  PCP or Specialist appointment within 3-5 days of discharge Complete -- Complete  HRI or Home Care Consult Complete Complete Complete  SW Recovery Care/Counseling Consult Complete Complete Complete  Palliative Care Screening Not Applicable Not Applicable Not Applicable  Skilled Nursing Facility Not Applicable Not Applicable Not Applicable

## 2023-06-18 NOTE — Consult Note (Signed)
Consultation Note Date: 06/18/2023   Patient Name: Roger Cook  DOB: 10/07/1926  MRN: [id number]  Age / Sex: 89 y.o., male  PCP: [name], MD Referring Physician: Delfino Lovett, MD  Reason for Consultation: Establishing goals of care  HPI/Patient Profile: PER EMR, 89 y.o. male with medical history significant for dementia, hypertension, hyperlipidemia, hypothyroid, history of TIA, history of prostate cancer, acute blood loss anemia and GI bleed attributed to known history of AVMs on colonoscopy that was done in 2023 and EGD done in feb 2024 was normal - Dr.Locklear.Patient also diagnosed with severe bilateral pulmonary embolism with right heart strain on Eliquis. since his admission with me on 04/08/2023 pt was admitted on 05/11/23  for syncope while sitting in his chair, then again on 06/03/23 for AMS and generalized weakness found tob e anemic with hb of 7.7 , then 06/04/23  again for same presentation being d/c on 9/14 after being evlaauted with negative EEG/ ABLA and transfusion  returns today with same presenation of syncope and collapse from hypotension with SBP of 60/30 o2 sat WNL and was given 400 ml LR in en route.    Clinical Assessment and Goals of Care: Notes and labs reviewed. Epic chat in place with attending and multiple staff members. Patient has 2 HPOA's and primary team has talked with patient's daughter [name]; IPAL note in place.    In to see patient. He is sitting in bed, no distress noted. He is able to tell me his name, that he is in the hospital, Roger Cook is president. He cannot tell me the year or why he is in the hospital. With discussion, he states he is a man of [employer]. He states he is ready to go home to be with his wife and family, and does not want to be here. He states he just wants to be comfortable, and states if the [facility] is ready for him, then he's ready to go.   Called to  speak with wife Roger Cook. She states they have been married 40+ years and patient's daughter is her step daughter.   She discusses his baseline dementia which he has had for around 4 years. She discusses his sharp decline since his initial PE's in 11/2022. She discusses hospitalizations and issues since then.   We discussed his diagnosis, prognosis, GOC, EOL wishes disposition and options.  Created space and opportunity for patient  to explore thoughts and feelings regarding current medical information.   A detailed discussion was had today regarding advanced directives.  Concepts specific to code status, artifical feeding and hydration, IV antibiotics and rehospitalization were discussed.  The difference between an aggressive medical intervention path and a comfort care path was discussed.  Values and goals of care important to patient and family were attempted to be elicited.  Discussed limitations of medical interventions to prolong quality of life in some situations and discussed the concept of human mortality.  Wife states she wants her husband to come home with hospice and be kept comfortable  until death. Team made aware. Attempted x2 unsuccessfully to reach daughter [name].   SUMMARY OF RECOMMENDATIONS   Wife wants home with hospice.   Prognosis:  poor       Primary Diagnoses: Present on Admission:  Coronary artery disease  Syncope and collapse  Dementia (HCC)  Iron deficiency anemia due to chronic blood loss  Hypothyroidism  Hypotension, unspecified  Bilateral pulmonary embolism (HCC)  Syncope   I have reviewed the medical record, interviewed the patient and family, and examined the patient. The following aspects are pertinent.  Past Medical History:  Diagnosis Date   Anemia    Aortic atherosclerosis (HCC)    Bell palsy    Bilateral renal cysts    Bladder tumor    CAD (coronary artery disease)    DOE (dyspnea on exertion)    Elevated lactic acid level 11/13/2022    GERD (gastroesophageal reflux disease)    History of angina    History of penile implant    History of prostate cancer    Hyperlipidemia    Hypertension    Hypothyroidism    Lipoma    MRSA bacteremia 02/2011   Nephrolithiasis    OSA on CPAP    Pneumonia    as a teenager   Social History   Socioeconomic History   Marital status: Married    Spouse name: Roger Cook   Number of children: 2   Years of education: Not on file   Highest education level: Bachelor's degree (e.g., BA, AB, BS)  Occupational History   Occupation: retired  Tobacco Use   Smoking status: Former    Current packs/day: 0.00    Average packs/day: 1 pack/day for 16.0 years (16.0 ttl pk-yrs)    Types: Cigarettes    Start date: 38    Quit date: 1964    Years since quitting: 60.7   Smokeless tobacco: Never  Vaping Use   Vaping status: Never Used  Substance and Sexual Activity   Alcohol use: Not Currently    Alcohol/week: 2.0 standard drinks of alcohol    Types: 2 Glasses of wine per week   Drug use: No   Sexual activity: Yes  Other Topics Concern   Not on file  Social History Narrative   Not on file   Social Determinants of Health   Financial Resource Strain: Low Risk  (06/01/2023)   Overall Financial Resource Strain (CARDIA)    Difficulty of Paying Living Expenses: Not hard at all  Food Insecurity: No Food Insecurity (06/18/2023)   Hunger Vital Sign    Worried About Running Out of Food in the Last Year: Never true    Ran Out of Food in the Last Year: Never true  Transportation Needs: No Transportation Needs (06/18/2023)   PRAPARE - Administrator, Civil Service (Medical): No    Lack of Transportation (Non-Medical): No  Physical Activity: Inactive (06/01/2023)   Exercise Vital Sign    Days of Exercise per Week: 0 days    Minutes of Exercise per Session: 0 min  Stress: No Stress Concern Present (06/01/2023)   [employer] of Occupational Health - Occupational Stress Questionnaire     Feeling of Stress : Not at all  Social Connections: Socially Isolated (06/01/2023)   Social Connection and Isolation Panel [NHANES]    Frequency of Communication with Friends and Family: Once a week    Frequency of Social Gatherings with Friends and Family: Never    Attends Religious Services: Never  Active Member of Clubs or Organizations: No    Attends Banker Meetings: Never    Marital Status: Married   Family History  Problem Relation Age of Onset   Lung cancer Mother    Heart disease Father    Scheduled Meds:  amLODipine  5 mg Oral Daily   apixaban  5 mg Oral BID   brimonidine  1 drop Left Eye BID   isosorbide mononitrate  15 mg Oral Daily   levothyroxine  88 mcg Oral Q0600   PARoxetine  10 mg Oral Daily   sodium chloride flush  3 mL Intravenous Q12H   Continuous Infusions: PRN Meds:.acetaminophen **OR** acetaminophen, hydrALAZINE, ondansetron **OR** ondansetron (ZOFRAN) IV Medications Prior to Admission:  Prior to Admission medications   Medication Sig Start Date End Date Taking? Authorizing Provider  acetaminophen (TYLENOL) 325 MG tablet Take 2 tablets (650 mg total) by mouth every 6 (six) hours as needed for mild pain (or Fever >/= 101). 12/09/22  Yes Loyce Dys, MD  amLODipine (NORVASC) 5 MG tablet Take 1 tablet (5 mg total) by mouth daily. 06/11/23  Yes Jacky Kindle, FNP  apixaban (ELIQUIS) 2.5 MG TABS tablet Take 1 tablet (2.5 mg total) by mouth 2 (two) times daily. Reduced dose for for prevention of blood clots, in the setting of likely GI bleed. 04/10/23  Yes Darlin Priestly, MD  Apoaequorin (PREVAGEN) 10 MG CAPS Take 10 mg by mouth daily.   Yes [provider]  ARTIFICIAL TEAR SOLUTION OP Place 1 drop into both eyes 2 (two) times daily as needed (dry eyes).   Yes [provider]  brimonidine (ALPHAGAN) 0.2 % ophthalmic solution Place 1 drop into the left eye 2 (two) times daily. 01/25/21  Yes [provider]  Calcium Carbonate-Vit  D-Min (CALCIUM 1200 PO) Take 1,200 mg by mouth daily.   Yes [provider]  cyanocobalamin (VITAMIN B12) 1000 MCG tablet Take 1 tablet (1,000 mcg total) by mouth daily. 11/18/22  Yes Esaw Grandchild A, DO  isosorbide mononitrate (IMDUR) 30 MG 24 hr tablet Take 0.5 tablets (15 mg total) by mouth daily. 05/12/23  Yes Leeroy Bock, MD  levothyroxine (SYNTHROID) 88 MCG tablet TAKE 1 TABLET EVERY DAY ON EMPTY STOMACHWITH A GLASS OF WATER AT LEAST 30-60 MINBEFORE BREAKFAST 12/24/22  Yes [name], MD  memantine (NAMENDA) 10 MG tablet TAKE 1 TABLET BY MOUTH TWO TIMES DAILY Patient taking differently: Take 10 mg by mouth 2 (two) times daily. 11/19/21  Yes [name], MD  MULTIPLE VITAMIN PO Take 1 tablet by mouth daily.   Yes [provider]  pantoprazole (PROTONIX) 40 MG tablet Take 1 tablet (40 mg total) by mouth 2 (two) times daily. 04/10/23  Yes Darlin Priestly, MD  PARoxetine (PAXIL) 10 MG tablet Take 10 mg by mouth daily.   Yes [provider]  ferrous sulfate 325 (65 FE) MG tablet Take 1 tablet (325 mg total) by mouth every other day. Patient not taking: Reported on 06/18/2023 01/05/23   [name], MD  NON FORMULARY CPAP (Free Text) - Historical Medication  As directed  Started 22-Sep-1994 Active Patient not taking: Reported on 06/18/2023 09/22/1994   [provider]   Allergies  Allergen Reactions   Iodinated Contrast Media    Levofloxacin Swelling    lips swelling   Povidone Iodine Other (See Comments)    Unknown, can not remember   Sulfa Antibiotics     Unknown, can not remember  Benadryl [Diphenhydramine] Rash   Review of Systems  All other systems reviewed and are negative.   Physical Exam Pulmonary:     Effort: Pulmonary effort is normal.  Neurological:     Mental Status: He is alert.     Vital Signs: BP (!) 148/47 (BP Location: Right Arm)   Pulse 78   Temp 97.6 F (36.4 C)   Resp 16   Ht 5\' 7"  (1.702 m)   Wt 61.3 kg    SpO2 97%   BMI 21.17 kg/m  Pain Scale: 0-10   Pain Score: 0-No pain   SpO2: SpO2: 97 % O2 Device:SpO2: 97 % O2 Flow Rate: .   IO: Intake/output summary: No intake or output data in the 24 hours ending 06/18/23 1557  LBM: Last BM Date : 06/17/23 Baseline Weight: Weight: 61.3 kg Most recent weight: Weight: 61.3 kg      Signed by: Morton Stall, NP   Please contact Palliative Medicine Team phone at [phone] for questions and concerns.  For individual provider: See Loretha Stapler

## 2023-06-18 NOTE — Assessment & Plan Note (Signed)
Stable, no complaints of chest pain or anginal equivalent presentation.  Troponin is negative. Will continue patient on statin therapy Eliquis, Synthroid, as needed Tylenol.

## 2023-06-18 NOTE — Assessment & Plan Note (Signed)
Patient with history of dizziness recurrent falls recurrent syncope history of GI bleed, history of anemia history of pulmonary embolism presenting again same presentation with syncope episode found to be hypotensive on initial assessment with systolics of 60/30. Will obtain an MRI of the brain to identify if patient is having strokes, risk of pulmonary embolism is also high in him as he has a history of PE and is on Eliquis will obtain a VQ scan. Fall precaution, aspiration precaution.  Chart review shows patient's last echo in March of this year with an EF of 60 to 65% and normal aortic valve structure.  EKG today shows change junctional rhythm at 71suspect patient has chronic GI blood loss with QRS of 105 and QTc of 449 After evaluation with MRI and VQ scan if negative will consider cardiology consult question if patient needs to have a Holter monitor uncertain if the patient is having dysrhythmias.

## 2023-06-18 NOTE — Progress Notes (Signed)
[facility]- Civil engineer, contracting  Received a referral today to speak with patient's spouse and daughters about Hospice services at home.  Family was not at beside, so HL left messages with family to call back. Awaiting a response from the family.    Please don't hesitate to call with any Hospice related questions or concerns.    Thank you for the opportunity to participate in this patient's care.  [facility] Liaison [phone]

## 2023-06-18 NOTE — ED Notes (Signed)
Pt placed on 2L [place] per Allena Katz, MD

## 2023-06-18 NOTE — Assessment & Plan Note (Signed)
Pt has BL PE and is on age adjusted eliquis.  V/q scan as this recurrent syncope presentation may be pulmonary embolism.

## 2023-06-18 NOTE — ED Notes (Signed)
Pt going to nuclear medicine

## 2023-06-18 NOTE — Assessment & Plan Note (Signed)
Unclear if patient is having hypotension from blood loss or dysrhythmias.  Currently blood pressure stable. Will hold all home medications, clear liquid diet.  Cardiology consulted 2D echocardiogram per a.m. team as deemed appropriate from overnight monitoring and results. Continue patient on LR with cautious and monitored IV fluid hydration. Vitals:   06/17/23 1747 06/17/23 1903 06/17/23 2030 06/17/23 2100  BP: (!) 151/51 (!) 147/57 (!) 148/52 (!) 128/59   06/17/23 2130 06/17/23 2200 06/17/23 2230 06/17/23 2300  BP: (!) 137/54 (!) 143/48 (!) 150/51 (!) 146/56   06/18/23 0026  BP: (!) 177/61  Will start patient on as needed hydralazine and continue patient's amlodipine patient is also on Imdur.

## 2023-06-18 NOTE — IPAL (Signed)
  Interdisciplinary Goals of Care Family Meeting   Date carried out: 06/18/2023  Location of the meeting: Bedside  Member's involved: Physician and Family Member or next of kin/daughter -[name]  Durable Power of Attorney or Environmental health practitioner: Daughter/[name] who is POA.  We are waiting for co-POA/Barbara to confirm and agree with the plan before getting hospice involved.  Discussion: We discussed goals of care for Roger Cook .  Code status:   Code Status: Limited: Do not attempt resuscitation (DNR) -DNR-LIMITED -Do Not Intubate/DNI    Disposition: Home with Hospice  Time spent for the meeting: 35 minutes    Delfino Lovett, MD  06/18/2023, 2:59 PM

## 2023-06-19 ENCOUNTER — Telehealth: Payer: Self-pay

## 2023-06-19 DIAGNOSIS — R55 Syncope and collapse: Secondary | ICD-10-CM | POA: Diagnosis not present

## 2023-06-19 DIAGNOSIS — Z7189 Other specified counseling: Secondary | ICD-10-CM | POA: Diagnosis not present

## 2023-06-19 DIAGNOSIS — F03B0 Unspecified dementia, moderate, without behavioral disturbance, psychotic disturbance, mood disturbance, and anxiety: Secondary | ICD-10-CM | POA: Diagnosis not present

## 2023-06-19 LAB — GLUCOSE, CAPILLARY: Glucose-Capillary: 116 mg/dL — ABNORMAL HIGH (ref 70–99)

## 2023-06-19 MED ORDER — APIXABAN 5 MG PO TABS: 5.0000 mg | ORAL_TABLET | Freq: Two times a day (BID) | ORAL | 0 refills | Status: AC

## 2023-06-19 MED ORDER — HALOPERIDOL 5 MG PO TABS: 5.0000 mg | ORAL_TABLET | ORAL | 0 refills | Status: AC | PRN

## 2023-06-19 MED ORDER — MORPHINE SULFATE (CONCENTRATE) 20 MG/ML PO SOLN: 5.0000 mg | ORAL | 0 refills | Status: AC | PRN

## 2023-06-19 MED ORDER — SENNOSIDES 8.6 MG PO TABS: 2.0000 | ORAL_TABLET | Freq: Two times a day (BID) | ORAL | 0 refills | Status: DC

## 2023-06-19 MED ORDER — HYOSCYAMINE SULFATE 0.125 MG SL SUBL: 0.1250 mg | SUBLINGUAL_TABLET | SUBLINGUAL | Status: DC | PRN

## 2023-06-19 MED ORDER — LORAZEPAM 0.5 MG PO TABS: 0.5000 mg | ORAL_TABLET | ORAL | 0 refills | Status: AC | PRN

## 2023-06-19 NOTE — TOC Transition Note (Signed)
Transition of Care [facility]) - CM/SW Discharge Note   Patient Details  Name: Roger Cook MRN: [id number] Date of Birth: Nov 11, 1926  [facility] CM/SW Contact:  Allena Katz, LCSW Phone Number: 06/19/2023, 10:22 AM   Clinical Narrative:   Pt discharging home with [facility] home hospice. Medical necessity placed on chart. Hospice to call ems this afternoon per daughter request. DNR on chart, CSW signing off.     Final next level of care: Home w Hospice Care Barriers to Discharge: Continued Medical Work up   Patient Goals and CMS Choice      Discharge Placement                         Discharge Plan and Services Additional resources added to the After Visit Summary for                                       Social Determinants of Health (SDOH) Interventions SDOH Screenings   Food Insecurity: No Food Insecurity (06/18/2023)  Housing: Patient Unable To Answer (06/18/2023)  Transportation Needs: No Transportation Needs (06/18/2023)  Utilities: Not At Risk (06/18/2023)  Alcohol Screen: Low Risk  (06/01/2023)  Depression (PHQ2-9): Low Risk  (06/01/2023)  Financial Resource Strain: Low Risk  (06/01/2023)  Physical Activity: Inactive (06/01/2023)  Social Connections: Socially Isolated (06/01/2023)  Stress: No Stress Concern Present (06/01/2023)  Tobacco Use: Medium Risk (06/18/2023)  Health Literacy: Adequate Health Literacy (06/01/2023)     Readmission Risk Interventions    06/03/2023   10:35 AM 04/09/2023   10:35 AM 01/26/2023    3:59 PM  Readmission Risk Prevention Plan  Transportation Screening Complete Complete Complete  Medication Review (RN Care Manager) Complete Complete Complete  PCP or Specialist appointment within 3-5 days of discharge Complete -- Complete  HRI or Home Care Consult Complete Complete Complete  SW Recovery Care/Counseling Consult Complete Complete Complete  Palliative Care Screening Not Applicable Not Applicable Not Applicable   Skilled Nursing Facility Not Applicable Not Applicable Not Applicable

## 2023-06-19 NOTE — Progress Notes (Signed)
[facility] Liaison Note  Received request from Uvaldo Rising,  Transitions of Care Manager, for hospice services at home after discharge.  Spoke with daughter, [name], to initiate education related to hospice philosophy, services, and team approach to care. Daughter verbalized understanding of information given.  Per discussion, the plan is for discharge home by EMS today.   DME needs discussed.  Patient has the following equipment in the home: Hospital bed, [facility], [place] and [employer]. Patient/family requests the following equipment in the home: Hospice nurse to assess for additional dme needs. The address has been verified and is correct in the chart.  Lavone Neri and phone number [phone]  is the family contact to arrange time of equipment delivery.    Please send signed and completed DNR home with the patient/family.  Please provide prescriptions at discharge as needed to ensure ongoing symptom management.   [facility] information and contact numbers given to daughter. Above information shared with Allena Katz, LCSW, Transitions of Care Manager.     Please call with any Hospice related questions or concerns.  Thank you for the opportunity to participate in this patient's care.  Redge Gainer, [facility] Liaison [phone]

## 2023-06-19 NOTE — Telephone Encounter (Unsigned)
Copied from CRM [id number]. Topic: General - Other >> Jun 19, 2023  1:34 PM [place] B wrote: Reason for CRM: Cordelia Pen from [employer], says pt is referred to hospice and is is discharging today and pt has assesement the tomorrow, and wants to know if  Dr thinks pt has 6 months to live and will he serve as attending

## 2023-06-19 NOTE — Progress Notes (Addendum)
Daily Progress Note   Patient Name: Roger Cook       Date: 06/19/2023 DOB: 09/01/1927  Age: 89 y.o. MRN#: [id number] Attending Physician: Delfino Lovett, MD Primary Care Physician: [name], MD Admit Date: 06/17/2023  Reason for Consultation/Follow-up: Establishing goals of care  Subjective: Notes reviewed.  Patient is discharging home with hospice care.  Into see patient to answer any questions patient or family may have.  Patient is sleeping soundly, no distress noted, even and unlabored respirations.  No family at bedside.  PMT will sign off at this time as goals are set.  Length of Stay: 1  Current Medications: Scheduled Meds:   amLODipine  5 mg Oral Daily   apixaban  5 mg Oral BID   brimonidine  1 drop Left Eye BID   isosorbide mononitrate  15 mg Oral Daily   levothyroxine  88 mcg Oral Q0600   PARoxetine  10 mg Oral Daily   sodium chloride flush  3 mL Intravenous Q12H    Continuous Infusions:   PRN Meds: acetaminophen **OR** acetaminophen, haloperidol lactate, hydrALAZINE, LORazepam, ondansetron **OR** ondansetron (ZOFRAN) IV  Physical Exam Constitutional:      Comments: Eyes closed  Pulmonary:     Effort: Pulmonary effort is normal.             Vital Signs: BP (!) 166/79 (BP Location: Left Arm)   Pulse 97   Temp 98.6 F (37 C) (Oral)   Resp 18   Ht 5\' 7"  (1.702 m)   Wt 66.2 kg   SpO2 97%   BMI 22.86 kg/m  SpO2: SpO2: 97 % O2 Device: O2 Device: Room Air O2 Flow Rate:    Intake/output summary:  Intake/Output Summary (Last 24 hours) at 06/19/2023 1132 Last data filed at 06/19/2023 1047 Gross per 24 hour  Intake 360 ml  Output 1200 ml  Net -840 ml   LBM: Last BM Date : 12/15/22 Baseline Weight: Weight: 61.3 kg Most recent weight: Weight: 66.2  kg      Patient Active Problem List   Diagnosis Date Noted   Goals of care, counseling/discussion 06/18/2023   Anemia due to stage 3b chronic kidney disease (HCC) 06/11/2023   History of pulmonary embolism 06/04/2023   CKD stage 3b, GFR 30-44 ml/min (HCC) 06/04/2023   Acute delirium  06/04/2023   Hyperlipidemia, unspecified 06/03/2023   Anxiety 06/03/2023   Dementia with behavioral disturbance (HCC) 06/03/2023   Coronary artery disease 06/03/2023   Acute kidney injury superimposed on chronic kidney disease (HCC) 06/03/2023   Dehydration 05/12/2023   Hypotension, unspecified 05/12/2023   Syncope and collapse 05/12/2023   Depression 05/11/2023   Gastrointestinal hemorrhage associated with duodenitis 04/23/2023   Postural dizziness with near syncope 04/08/2023   Hospital discharge follow-up 01/29/2023   History of bradycardia 01/29/2023   History of syncope 01/29/2023   Transient hypotension 01/29/2023   AMS (altered mental status) 01/24/2023   Sinus bradycardia 01/24/2023   Bilateral pulmonary embolism (HCC) 12/06/2022   Vitamin B12 deficiency 11/17/2022   Generalized weakness 11/17/2022   Positive blood culture 11/15/2022   Chronic anemia 11/13/2022   Hyperglycemia 07/09/2022   Iron deficiency anemia due to chronic blood loss    Angiodysplasia of intestinal tract    Anemia requiring transfusions 12/24/2021   Angina pectoris (HCC) 09/18/2020   Dementia (HCC) 09/18/2020   Renal hematoma, right, subsequent encounter 04/25/2018   Localized osteoporosis of spine 12/23/2017   Constipation 11/06/2016   Bell's palsy 02/26/2015   Nephrogenic adenoma of bladder 02/26/2015   Folliculitis 02/26/2015   Personal history of methicillin resistant Staphylococcus aureus 02/26/2015   Fatty tumor 02/26/2015   Syncope 02/26/2015   Fungal infection of nail 02/26/2015   H/O malignant neoplasm of prostate 02/26/2015   Melena 10/18/2012   Arteriosclerosis of coronary artery 01/05/2009    Sleep apnea 01/05/2009   Hypothyroidism 01/05/2009   Essential hypertension 09/22/1998    Palliative Care Assessment & Plan     Recommendations/Plan: Patient discharging home with hospice.  PMT will sign off at this time as goals are set.    Code Status:    Code Status Orders  (From admission, onward)           Start     Ordered   06/18/23 1135  Do not attempt resuscitation (DNR)- Limited -Do Not Intubate (DNI)  (Code Status)  Continuous       Question Answer Comment  If pulseless and not breathing No CPR or chest compressions.   In Pre-Arrest Conditions (Patient Is Breathing and Has A Pulse) Do not intubate. Provide all appropriate non-invasive medical interventions. Avoid ICU transfer unless indicated or required.   Consent: Discussion documented in EHR or advanced directives reviewed      06/18/23 1134           Code Status History     Date Active Date Inactive Code Status Order ID Comments User Context   06/17/2023 2328 06/18/2023 1134 Full Code [id number]  Gertha Calkin, MD ED   06/04/2023 1723 06/06/2023 2033 Limited: Do not attempt resuscitation (DNR) -DNR-LIMITED -Do Not Intubate/DNI  [id number]  Alford Highland, MD ED   06/03/2023 0138 06/04/2023 1519 Limited: Do not attempt resuscitation (DNR) -DNR-LIMITED -Do Not Intubate/DNI  [id number]  Arville Care, Vernetta Honey, MD ED   05/11/2023 1906 05/12/2023 1945 DNR [id number]  Cox, Amy N, DO ED   04/09/2023 1218 04/10/2023 1552 DNR [id number]  Darlin Priestly, MD Inpatient   04/08/2023 1944 04/09/2023 1218 Full Code [id number]  Gertha Calkin, MD ED   01/25/2023 1811 01/27/2023 1936 DNR [id number]  Fran Lowes, DO Inpatient   01/25/2023 1807 01/25/2023 1811 DNR [id number]  Theotis Burrow, NP Inpatient   01/25/2023 0111 01/25/2023 1807 Full Code [id number]  Gertha Calkin, MD ED   12/19/2022 1732 12/21/2022 2046 Full Code [id number]  Dimple Nanas, MD ED   12/06/2022 2316 12/09/2022 2035 Full Code [id number]  Andris Baumann, MD ED   11/13/2022 1620 11/19/2022 1655  Full Code [id number]  Lovenia Kim, DO ED   12/24/2021 1628 12/27/2021 1556 Full Code [id number]  Lurline Del, MD ED       Prognosis:  < 3 months   Care discussed in person with attending physician.  Care discussed with [facility], hospice liaison, attending via epic chat.  Thank you for allowing the Palliative Medicine Team to assist in the care of this patient.   Morton Stall, NP  Please contact Palliative Medicine Team phone at [phone] for questions and concerns.

## 2023-06-19 NOTE — Progress Notes (Signed)
Patient alert to self only. Patient removed gown and donned home clothing. Patient attempting to exit room multiple times with very unsteady gait. Patient becoming agitated when attempts made to re-direct. MD made aware and prn medication given per [facility].

## 2023-06-19 NOTE — Consult Note (Signed)
[facility]  [facility] Inpatient Consult   06/19/2023  RYKAR LEBLEU 08-24-1927 [id number]  [facility] [[facility]]  Accountable Care Organization [ACO] Patient: Medicare ACO REACH  * RN Hospital Liaison remote coverage review for patient admitted to [facility] for Elliot Cousin, RN HL  Chart reviewed and reveals the patient is currently transitioning to Home with Hospice Care with [facility].  Plan: Patient will have full case management services through Hospice and needs will be met at the hospice level of care. No [facility] Care Management is planned for transitional needs.   Will sign off at transition from hospital.  For questions,   Charlesetta Shanks, RN, BSN, CCM [place]  [facility] Liaison Direct Dial: [phone] or secure chat Website: [email or web address]

## 2023-06-20 DIAGNOSIS — K219 Gastro-esophageal reflux disease without esophagitis: Secondary | ICD-10-CM | POA: Diagnosis not present

## 2023-06-20 DIAGNOSIS — E039 Hypothyroidism, unspecified: Secondary | ICD-10-CM | POA: Diagnosis not present

## 2023-06-20 DIAGNOSIS — N209 Urinary calculus, unspecified: Secondary | ICD-10-CM | POA: Diagnosis not present

## 2023-06-20 DIAGNOSIS — I25119 Atherosclerotic heart disease of native coronary artery with unspecified angina pectoris: Secondary | ICD-10-CM | POA: Diagnosis not present

## 2023-06-20 DIAGNOSIS — D649 Anemia, unspecified: Secondary | ICD-10-CM | POA: Diagnosis not present

## 2023-06-20 DIAGNOSIS — D500 Iron deficiency anemia secondary to blood loss (chronic): Secondary | ICD-10-CM | POA: Diagnosis not present

## 2023-06-20 DIAGNOSIS — G510 Bell's palsy: Secondary | ICD-10-CM | POA: Diagnosis not present

## 2023-06-20 DIAGNOSIS — E785 Hyperlipidemia, unspecified: Secondary | ICD-10-CM | POA: Diagnosis not present

## 2023-06-20 DIAGNOSIS — D1730 Benign lipomatous neoplasm of skin and subcutaneous tissue of unspecified sites: Secondary | ICD-10-CM | POA: Diagnosis not present

## 2023-06-20 DIAGNOSIS — I679 Cerebrovascular disease, unspecified: Secondary | ICD-10-CM | POA: Diagnosis not present

## 2023-06-20 DIAGNOSIS — N281 Cyst of kidney, acquired: Secondary | ICD-10-CM | POA: Diagnosis not present

## 2023-06-20 DIAGNOSIS — F39 Unspecified mood [affective] disorder: Secondary | ICD-10-CM | POA: Diagnosis not present

## 2023-06-20 DIAGNOSIS — G4733 Obstructive sleep apnea (adult) (pediatric): Secondary | ICD-10-CM | POA: Diagnosis not present

## 2023-06-20 DIAGNOSIS — I10 Essential (primary) hypertension: Secondary | ICD-10-CM | POA: Diagnosis not present

## 2023-06-20 DIAGNOSIS — I2510 Atherosclerotic heart disease of native coronary artery without angina pectoris: Secondary | ICD-10-CM | POA: Diagnosis not present

## 2023-06-20 NOTE — Discharge Summary (Signed)
Physician Discharge Summary   Patient: Roger Cook MRN: [id number] DOB: 09/21/1927  Admit date:     06/17/2023  Discharge date: 06/19/2023  Discharge Physician: Delfino Lovett   PCP: [name], MD   Recommendations at discharge:    Follow-up with outpatient providers as requested  Discharge Diagnoses: Principal Problem:   Syncope and collapse Active Problems:   Syncope   Hypotension, unspecified   Iron deficiency anemia due to chronic blood loss   Bilateral pulmonary embolism (HCC)   Hypothyroidism   Coronary artery disease   Dementia (HCC)   Goals of care, counseling/discussion  Hospital Course: Assessment and Plan:  89 y.o. male with medical history significant for dementia, hypertension, hyperlipidemia, hypothyroid, history of TIA, history of prostate cancer, acute blood loss anemia and GI bleed attributed to known history of AVMs on colonoscopy that was done in 2023 and EGD done in feb 2024 was normal - Dr.Locklear.Patient also diagnosed with severe bilateral pulmonary embolism with right heart strain on Eliquis. since his admission with me on 04/08/2023 pt was admitted on 05/11/23  for syncope while sitting in his chair, then again on 06/03/23 for AMS and generalized weakness found tob e anemic with hb of 7.7 , then 06/04/23  again for same presentation being d/c on 9/14 after being evlaauted with negative EEG/ ABLA and transfusion  returns today with same presenation of syncope and collapse from hypotension with SBP of 60/30 o2 sat WNL and was given 400 ml LR in en rooute.     In total, reviewing [facility], he is showing 6 readmissions and 2 ED visits in last 18-months   9/26: VQ scan concerning for PE, palliative care consult.  Family leaning towards comfort care/hospice at home   Recurrent syncope Patient has had recurrent falls due to syncope.  Likely multifactorial.  CT head negative for acute pathology   Dementia with behavioral disturbances He has 24/7 caregivers at home.   He is requiring more assistance with his ADLs.  Palliative care consult for goals of care conversation I had discussion with his family/POA who is in agreement to have comfort care/hospice at home    Anemia requiring transfusions Essential hypertension History of pulmonary embolism Repeat VQ scan on this admission shows acute PE Hypothyroidism CKD stage 3b, GFR 30-44 ml/min (HCC) Hyperlipidemia, unspecified Anxiety Sleep apnea       Consultants: Palliative care Disposition: Hospice care Diet recommendation:  Discharge Diet Orders (From admission, onward)     Start     Ordered   06/19/23 0000  Diet - low sodium heart healthy        06/19/23 0937           Regular diet DISCHARGE MEDICATION: Allergies as of 06/19/2023       Reactions   Iodinated Contrast Media    Levofloxacin Swelling   lips swelling   Povidone Iodine Other (See Comments)   Unknown, can not remember   Sulfa Antibiotics    Unknown, can not remember   Benadryl [diphenhydramine] Rash        Medication List     STOP taking these medications    acetaminophen 325 MG tablet Commonly known as: TYLENOL   ARTIFICIAL TEAR SOLUTION OP   CALCIUM 1200 PO   cyanocobalamin 1000 MCG tablet Commonly known as: VITAMIN B12   ferrous sulfate 325 (65 FE) MG tablet   MULTIPLE VITAMIN PO   NON FORMULARY   pantoprazole 40 MG tablet Commonly known as: Protonix  Prevagen 10 MG Caps Generic drug: Apoaequorin       TAKE these medications    amLODipine 5 MG tablet Commonly known as: NORVASC Take 1 tablet (5 mg total) by mouth daily.   apixaban 5 MG Tabs tablet Commonly known as: ELIQUIS Take 1 tablet (5 mg total) by mouth 2 (two) times daily. What changed:  medication strength how much to take additional instructions   brimonidine 0.2 % ophthalmic solution Commonly known as: ALPHAGAN Place 1 drop into the left eye 2 (two) times daily.   haloperidol 5 MG tablet Commonly known as:  HALDOL Take 1 tablet (5 mg total) by mouth every 4 (four) hours as needed for up to 3 days for agitation. May crush, mix with water and give sublingually if needed.   hyoscyamine 0.125 MG SL tablet Commonly known as: LEVSIN SL Place 1 tablet (0.125 mg total) under the tongue every 4 (four) hours as needed (excess oral secretions).   isosorbide mononitrate 30 MG 24 hr tablet Commonly known as: IMDUR Take 0.5 tablets (15 mg total) by mouth daily.   levothyroxine 88 MCG tablet Commonly known as: SYNTHROID TAKE 1 TABLET EVERY DAY ON EMPTY STOMACHWITH A GLASS OF WATER AT LEAST 30-60 MINBEFORE BREAKFAST   LORazepam 0.5 MG tablet Commonly known as: ATIVAN Take 1 tablet (0.5 mg total) by mouth every 4 (four) hours as needed for up to 3 days for anxiety. May crush, mix with water and give sublingually if needed.   memantine 10 MG tablet Commonly known as: NAMENDA TAKE 1 TABLET BY MOUTH TWO TIMES DAILY   morphine 20 MG/ML concentrated solution Commonly known as: ROXANOL Take 0.25 mLs (5 mg total) by mouth every 4 (four) hours as needed for up to 3 days for severe pain, breakthrough pain, anxiety, shortness of breath or moderate pain. May give sublingually if needed.   PARoxetine 10 MG tablet Commonly known as: PAXIL Take 10 mg by mouth daily.   senna 8.6 MG tablet Commonly known as: Senokot Take 2 tablets (17.2 mg total) by mouth 2 (two) times daily. May crush, mix with water and give sublingually if needed.        Follow-up Information     [name], MD. Schedule an appointment as soon as possible for a visit in 1 week(s).   Specialty: Family Medicine Why: [facility] Discharge F/UP Contact information: [address] [place] [address] [phone]                Discharge Exam: Ceasar Mons Weights   06/17/23 1749 06/19/23 0215  Weight: 61.3 kg 66.2 kg   General exam: Appears calm and comfortable  Respiratory system: Clear to auscultation.  Respiratory effort normal. Cardiovascular system: S1 & S2 heard, RRR. No JVD, murmurs, rubs, gallops or clicks. No pedal edema. Gastrointestinal system: Abdomen is nondistended, soft and nontender. No organomegaly or masses felt. Normal bowel sounds heard. Central nervous system: Alert and awake. No focal neurological deficits. Extremities: Symmetric 5 x 5 power. Skin: No rashes, lesions or ulcers Psychiatry: Difficult evaluate due to his underlying dementia  Condition at discharge: poor  The results of significant diagnostics from this hospitalization (including imaging, microbiology, ancillary and laboratory) are listed below for reference.   Imaging Studies: NM Pulmonary Perfusion  Result Date: 06/18/2023 CLINICAL DATA:  Evaluate for pulmonary embolism.  High probability. EXAM: NUCLEAR MEDICINE PERFUSION LUNG SCAN TECHNIQUE: Perfusion images were obtained in multiple projections after intravenous injection of radiopharmaceutical. Ventilation scans intentionally deferred  if perfusion scan and chest x-ray adequate for interpretation during COVID 19 epidemic. RADIOPHARMACEUTICALS:  4.2 mCi Tc-55m MAA IV COMPARISON:  Chest radiograph from earlier today FINDINGS: There is mild heterogeneous radiotracer activity identified within both lungs. Bilateral mid lung peripheral segmental perfusion defects are identified which appear moderate. No additional segmental perfusion defects identified. IMPRESSION: 1. Bilateral mid lung peripheral segmental perfusion defects compatible with acute pulmonary embolism. Electronically Signed   By: Signa Kell M.D.   On: 06/18/2023 11:53   DG Chest Port 1 View  Result Date: 06/18/2023 CLINICAL DATA:  Dyspnea. EXAM: PORTABLE CHEST 1 VIEW COMPARISON:  June 04, 2023. FINDINGS: The heart size and mediastinal contours are within normal limits. Both lungs are clear. The visualized skeletal structures are unremarkable. IMPRESSION: No active disease. Electronically  Signed   By: Lupita Raider M.D.   On: 06/18/2023 09:27   CT Head Wo Contrast  Result Date: 06/17/2023 CLINICAL DATA:  Mental status change EXAM: CT HEAD WITHOUT CONTRAST TECHNIQUE: Contiguous axial images were obtained from the base of the skull through the vertex without intravenous contrast. RADIATION DOSE REDUCTION: This exam was performed according to the departmental dose-optimization program which includes automated exposure control, adjustment of the mA and/or kV according to patient size and/or use of iterative reconstruction technique. COMPARISON:  CT brain 06/14/2023, 05/12/2023 FINDINGS: Brain: No acute territorial infarction, hemorrhage or intracranial mass. Chronic appearing lacunar infarcts in the left basal ganglia. Atrophy and advanced chronic small vessel ischemic changes of the white matter. Stable ventricle size Vascular: No hyperdense vessels.  Carotid vascular calcification Skull: Normal. Negative for fracture or focal lesion. Sinuses/Orbits: Opacified frontal sinuses postsurgical changes of the ethmoid sinuses Other: None IMPRESSION: 1. No CT evidence for acute intracranial abnormality. 2. Atrophy and advanced chronic small vessel ischemic changes of the white matter. Chronic appearing lacunar infarcts in the left basal ganglia. Electronically Signed   By: Jasmine Pang M.D.   On: 06/17/2023 19:32   CT Cervical Spine Wo Contrast  Addendum Date: 06/14/2023   ADDENDUM REPORT: 06/14/2023 18:03 ADDENDUM: Chronic compression deformity at T3. Electronically Signed   By: Jasmine Pang M.D.   On: 06/14/2023 18:03   Result Date: 06/14/2023 CLINICAL DATA:  Fall EXAM: CT CERVICAL SPINE WITHOUT CONTRAST TECHNIQUE: Multidetector CT imaging of the cervical spine was performed without intravenous contrast. Multiplanar CT image reconstructions were also generated. RADIATION DOSE REDUCTION: This exam was performed according to the departmental dose-optimization program which includes automated  exposure control, adjustment of the mA and/or kV according to patient size and/or use of iterative reconstruction technique. COMPARISON:  CT 05/12/2023 FINDINGS: Alignment: Stable alignment. Trace anterolisthesis C4 on C5. Facet alignment within normal limits. Skull base and vertebrae: No acute fracture. No primary bone lesion or focal pathologic process. Soft tissues and spinal canal: No prevertebral fluid or swelling. No visible canal hematoma. Similar calcification within the right canal at the C2 level. Disc levels: Partial ankylosis at C2-C3. Mild disc space narrowing C5-C6 and C6-C7. Upper chest: Negative. Other: None IMPRESSION: 1. No CT evidence for acute osseous abnormality. 2. Similar benign-appearing calcification within the right canal at the C2 level. 3. Mild degenerative changes. Electronically Signed: By: Jasmine Pang M.D. On: 06/14/2023 17:54   DG Elbow Complete Right  Result Date: 06/14/2023 CLINICAL DATA:  Fall with elbow abrasion EXAM: RIGHT ELBOW - COMPLETE 3+ VIEW COMPARISON:  None Available. FINDINGS: No malalignment. Positive for elbow effusion. Question small intra-articular fracture at the radial head on the lateral view.  IMPRESSION: Positive for elbow effusion. Question small intra-articular fracture at the radial head. Electronically Signed   By: Jasmine Pang M.D.   On: 06/14/2023 17:56   DG Elbow Complete Left  Result Date: 06/14/2023 CLINICAL DATA:  Fall with elbow abrasion EXAM: LEFT ELBOW - COMPLETE 3+ VIEW COMPARISON:  None Available. FINDINGS: There is no evidence of fracture, dislocation, or joint effusion. There is no evidence of arthropathy or other focal bone abnormality. Soft tissues are unremarkable. IMPRESSION: Negative. Electronically Signed   By: Jasmine Pang M.D.   On: 06/14/2023 17:55   CT HEAD WO CONTRAST ( )  Result Date: 06/14/2023 CLINICAL DATA:  Fall abrasions history of dementia syncope EXAM: CT HEAD WITHOUT CONTRAST TECHNIQUE: Contiguous axial  images were obtained from the base of the skull through the vertex without intravenous contrast. RADIATION DOSE REDUCTION: This exam was performed according to the departmental dose-optimization program which includes automated exposure control, adjustment of the mA and/or kV according to patient size and/or use of iterative reconstruction technique. COMPARISON:  CT brain 06/05/2023 FINDINGS: Brain: No acute territorial infarction, hemorrhage or intracranial mass. Moderate atrophy. Moderate white matter hypodensity consistent with chronic small vessel ischemic change. Stable ventricle size Vascular: No hyperdense vessels.  Carotid vascular calcification Skull: Normal. Negative for fracture or focal lesion. Sinuses/Orbits: Postsurgical changes of the sinuses. Opacified frontal sinuses Other: None IMPRESSION: 1. No CT evidence for acute intracranial abnormality. 2. Atrophy and chronic small vessel ischemic changes of the white matter. 3. Postsurgical changes of the sinuses with opacified frontal sinuses. Electronically Signed   By: Jasmine Pang M.D.   On: 06/14/2023 17:50   EEG adult  Result Date: 06/06/2023 Jefferson Fuel, MD     06/06/2023 12:03 PM Routine EEG Report NICOLAOS LASHWAY is a 89 y.o. male with a history of syncope who is undergoing an EEG to evaluate for seizures. Report: This EEG was acquired with electrodes placed according to the International 10-20 electrode system (including Fp1, Fp2, F3, F4, C3, C4, P3, P4, O1, O2, T3, T4, T5, T6, A1, A2, Fz, Cz, Pz). The following electrodes were missing or displaced: none. The occipital dominant rhythm was 8.5 Hz. This activity is reactive to stimulation. Drowsiness was manifested by background fragmentation; deeper stages of sleep were not identified. There was no focal slowing. There were no interictal epileptiform discharges. There were no electrographic seizures identified. There was no abnormal response to photic stimulation or hyperventilation.  Impression: This EEG was obtained while awake and drowsy and is normal.   Clinical Correlation: Normal EEGs, however, do not rule out epilepsy. Bing Neighbors, MD [facility] [phone] If 7pm- 7am, please page neurology on call as listed in AMION.   CT HEAD WO CONTRAST ( )  Result Date: 06/05/2023 CLINICAL DATA:  Altered mental status, head trauma EXAM: CT HEAD WITHOUT CONTRAST TECHNIQUE: Contiguous axial images were obtained from the base of the skull through the vertex without intravenous contrast. RADIATION DOSE REDUCTION: This exam was performed according to the departmental dose-optimization program which includes automated exposure control, adjustment of the mA and/or kV according to patient size and/or use of iterative reconstruction technique. COMPARISON:  06/02/2023 FINDINGS: Brain: No evidence of acute infarction, hemorrhage, mass, mass effect, or midline shift. No hydrocephalus or extra-axial fluid collection. Periventricular white matter changes, likely the sequela of chronic small vessel ischemic disease. Vascular: No hyperdense vessel. Atherosclerotic calcifications in the intracranial carotid and vertebral arteries. Skull: Negative for fracture or focal lesion. Sinuses/Orbits: No acute finding. Other: The mastoid  air cells are well aerated. IMPRESSION: No acute intracranial process. Electronically Signed   By: Wiliam Ke M.D.   On: 06/05/2023 19:18   DG Chest Port 1 View  Result Date: 06/04/2023 CLINICAL DATA:  Altered mental status and weakness EXAM: PORTABLE CHEST 1 VIEW COMPARISON:  Chest radiograph dated 06/02/2023 FINDINGS: Low lung volumes. No focal consolidations. No pleural effusion or pneumothorax. The heart size and mediastinal contours are within normal limits. No acute osseous abnormality. IMPRESSION: No active disease. Electronically Signed   By: Agustin Cree M.D.   On: 06/04/2023 20:00   DG Chest Port 1 View  Result Date: 06/03/2023 CLINICAL DATA:  Altered  mental status EXAM: PORTABLE CHEST 1 VIEW COMPARISON:  05/11/2023 FINDINGS: The heart size and mediastinal contours are within normal limits. Aortic atherosclerosis. Both lungs are clear. The visualized skeletal structures are unremarkable. IMPRESSION: No active disease. Electronically Signed   By: Jasmine Pang M.D.   On: 06/03/2023 00:03   CT Head Wo Contrast  Result Date: 06/03/2023 CLINICAL DATA:  Altered not talking or responding EXAM: CT HEAD WITHOUT CONTRAST TECHNIQUE: Contiguous axial images were obtained from the base of the skull through the vertex without intravenous contrast. RADIATION DOSE REDUCTION: This exam was performed according to the departmental dose-optimization program which includes automated exposure control, adjustment of the mA and/or kV according to patient size and/or use of iterative reconstruction technique. COMPARISON:  CT brain 05/12/2023 FINDINGS: Brain: No acute territorial infarction, hemorrhage or intracranial mass. Atrophy and chronic small vessel ischemic changes of the white matter. Stable ventricle size Vascular: No hyperdense vessels.  Carotid vascular calcification Skull: Normal. Negative for fracture or focal lesion. Sinuses/Orbits: No acute finding. Other: None IMPRESSION: 1. No CT evidence for acute intracranial abnormality. 2. Atrophy and chronic small vessel ischemic changes of the white matter. Electronically Signed   By: Jasmine Pang M.D.   On: 06/03/2023 00:03    Microbiology: Results for orders placed or performed during the hospital encounter of 06/02/23  SARS Coronavirus 2 by RT PCR (hospital order, performed in [facility] hospital lab) *cepheid single result test* Anterior Nasal Swab     Status: None   Collection Time: 06/02/23 11:47 PM   Specimen: Anterior Nasal Swab  Result Value Ref Range Status   SARS Coronavirus 2 by RT PCR NEGATIVE NEGATIVE Final    Comment: (NOTE) SARS-CoV-2 target nucleic acids are NOT DETECTED.  The SARS-CoV-2 RNA is  generally detectable in upper and lower respiratory specimens during the acute phase of infection. The lowest concentration of SARS-CoV-2 viral copies this assay can detect is 250 copies / mL. A negative result does not preclude SARS-CoV-2 infection and should not be used as the sole basis for treatment or other patient management decisions.  A negative result may occur with improper specimen collection / handling, submission of specimen other than nasopharyngeal swab, presence of viral mutation(s) within the areas targeted by this assay, and inadequate number of viral copies (<250 copies / mL). A negative result must be combined with clinical observations, patient history, and epidemiological information.  Fact Sheet for Patients:   RoadLapTop.co.za  Fact Sheet for Healthcare Providers: http://kim-miller.com/  This test is not yet approved or  cleared by the [place] FDA and has been authorized for detection and/or diagnosis of SARS-CoV-2 by FDA under an Emergency Use Authorization (EUA).  This EUA will remain in effect (meaning this test can be used) for the duration of the COVID-19 declaration under Section 564(b)(1) of the Act,  21 U.S.C. section 360bbb-3(b)(1), unless the authorization is terminated or revoked sooner.  Performed at [facility], [address] [place], [place] [address]     Labs: CBC: Recent Labs  Lab 06/17/23 1754 06/18/23 0340  WBC 10.9* 7.8  NEUTROABS 8.3*  --   HGB 10.8* 10.3*  HCT 32.7* 31.3*  MCV 93.4 94.8  PLT 245 220   Basic Metabolic Panel: Recent Labs  Lab 06/17/23 1754 06/18/23 0340  NA 136 137  K 4.9 3.5  CL 103 106  CO2 21* 21*  GLUCOSE 171* 109*  BUN 25* 25*  CREATININE 1.75* 1.38*  CALCIUM 8.6* 8.7*  MG  --  2.3  2.2   Liver Function Tests: Recent Labs  Lab 06/18/23 0340  AST 22  ALT 17  ALKPHOS 57  BILITOT 0.8  PROT 6.2*  ALBUMIN 3.3*   CBG: Recent  Labs  Lab 06/19/23 0454  GLUCAP 116*    Discharge time spent: greater than 30 minutes.  Signed: Delfino Lovett, MD [facility] 06/20/2023

## 2023-06-21 DIAGNOSIS — G510 Bell's palsy: Secondary | ICD-10-CM | POA: Diagnosis not present

## 2023-06-21 DIAGNOSIS — D500 Iron deficiency anemia secondary to blood loss (chronic): Secondary | ICD-10-CM | POA: Diagnosis not present

## 2023-06-21 DIAGNOSIS — I25119 Atherosclerotic heart disease of native coronary artery with unspecified angina pectoris: Secondary | ICD-10-CM | POA: Diagnosis not present

## 2023-06-21 DIAGNOSIS — I10 Essential (primary) hypertension: Secondary | ICD-10-CM | POA: Diagnosis not present

## 2023-06-21 DIAGNOSIS — I679 Cerebrovascular disease, unspecified: Secondary | ICD-10-CM | POA: Diagnosis not present

## 2023-06-21 DIAGNOSIS — E785 Hyperlipidemia, unspecified: Secondary | ICD-10-CM | POA: Diagnosis not present

## 2023-06-21 NOTE — Telephone Encounter (Signed)
Agree, life expectancy is < 6 months and I'll be attending

## 2023-06-22 ENCOUNTER — Telehealth: Payer: Self-pay

## 2023-06-22 DIAGNOSIS — D1730 Benign lipomatous neoplasm of skin and subcutaneous tissue of unspecified sites: Secondary | ICD-10-CM | POA: Diagnosis not present

## 2023-06-22 DIAGNOSIS — I25119 Atherosclerotic heart disease of native coronary artery with unspecified angina pectoris: Secondary | ICD-10-CM | POA: Diagnosis not present

## 2023-06-22 DIAGNOSIS — I10 Essential (primary) hypertension: Secondary | ICD-10-CM | POA: Diagnosis not present

## 2023-06-22 DIAGNOSIS — G510 Bell's palsy: Secondary | ICD-10-CM | POA: Diagnosis not present

## 2023-06-22 DIAGNOSIS — I679 Cerebrovascular disease, unspecified: Secondary | ICD-10-CM | POA: Diagnosis not present

## 2023-06-22 DIAGNOSIS — D500 Iron deficiency anemia secondary to blood loss (chronic): Secondary | ICD-10-CM | POA: Diagnosis not present

## 2023-06-22 DIAGNOSIS — K219 Gastro-esophageal reflux disease without esophagitis: Secondary | ICD-10-CM | POA: Diagnosis not present

## 2023-06-22 DIAGNOSIS — N209 Urinary calculus, unspecified: Secondary | ICD-10-CM | POA: Diagnosis not present

## 2023-06-22 DIAGNOSIS — N281 Cyst of kidney, acquired: Secondary | ICD-10-CM | POA: Diagnosis not present

## 2023-06-22 DIAGNOSIS — E785 Hyperlipidemia, unspecified: Secondary | ICD-10-CM | POA: Diagnosis not present

## 2023-06-22 DIAGNOSIS — E039 Hypothyroidism, unspecified: Secondary | ICD-10-CM | POA: Diagnosis not present

## 2023-06-22 DIAGNOSIS — G4733 Obstructive sleep apnea (adult) (pediatric): Secondary | ICD-10-CM | POA: Diagnosis not present

## 2023-06-22 NOTE — Telephone Encounter (Signed)
Called sherry with [employer] and is aware of providers message

## 2023-06-22 NOTE — Transitions of Care (Post Inpatient/ED Visit) (Signed)
06/22/2023  Name: Roger Cook MRN: [id number] DOB: 08-31-1927  Today's TOC FU Call Status: Today's TOC FU Call Status:: Successful TOC FU Call Completed TOC FU Call Complete Date: 06/22/23 Patient's Name and Date of Birth confirmed.  Transition Care Management Follow-up Telephone Call Date of Discharge: 06/19/23 Discharge Facility: [facility]) Type of Discharge: Inpatient Admission Primary Inpatient Discharge Diagnosis:: syncope How have you been since you were released from the hospital?: Better Any questions or concerns?: No  Items Reviewed: Did you receive and understand the discharge instructions provided?: Yes Medications obtained,verified, and reconciled?: Yes (Medications Reviewed) Any new allergies since your discharge?: No Dietary orders reviewed?: Yes Do you have support at home?: Yes People in Home: spouse  Medications Reviewed Today: Medications Reviewed Today     Reviewed by Karena Addison, LPN (Licensed Practical Nurse) on 06/22/23 at 1157  Med List Status: <None>   Medication Order Taking? Sig Documenting Provider Last Dose Status Informant  amLODipine (NORVASC) 5 MG tablet [id number] No Take 1 tablet (5 mg total) by mouth daily. Jacky Kindle, FNP 06/17/2023 Active Spouse/Significant Other, Child  apixaban (ELIQUIS) 5 MG TABS tablet [id number]  Take 1 tablet (5 mg total) by mouth 2 (two) times daily. Delfino Lovett, MD  Active   brimonidine [facility]) 0.2 % ophthalmic solution [id number] No Place 1 drop into the left eye 2 (two) times daily. [provider] 06/17/2023 Active Spouse/Significant Other, Child  haloperidol (HALDOL) 5 MG tablet [id number]  Take 1 tablet (5 mg total) by mouth every 4 (four) hours as needed for up to 3 days for agitation. May crush, mix with water and give sublingually if needed. Delfino Lovett, MD  Active   hyoscyamine (LEVSIN SL) 0.125 MG SL tablet [id number]  Place 1 tablet (0.125 mg total) under the  tongue every 4 (four) hours as needed (excess oral secretions). Delfino Lovett, MD  Active   isosorbide mononitrate (IMDUR) 30 MG 24 hr tablet [id number] No Take 0.5 tablets (15 mg total) by mouth daily. Leeroy Bock, MD 06/17/2023 Active Spouse/Significant Other, Child  levothyroxine (SYNTHROID) 88 MCG tablet [id number] No TAKE 1 TABLET EVERY DAY ON EMPTY STOMACHWITH A GLASS OF WATER AT LEAST 30-60 MINBEFORE BREAKFAST [name], MD 06/17/2023 Active Spouse/Significant Other, Child  LORazepam (ATIVAN) 0.5 MG tablet [id number]  Take 1 tablet (0.5 mg total) by mouth every 4 (four) hours as needed for up to 3 days for anxiety. May crush, mix with water and give sublingually if needed. Delfino Lovett, MD  Active   memantine [facility]) 10 MG tablet [id number] No TAKE 1 TABLET BY MOUTH TWO TIMES DAILY  Patient taking differently: Take 10 mg by mouth 2 (two) times daily.   [name], MD 06/17/2023 Active Spouse/Significant Other, Child  morphine (ROXANOL) 20 MG/ML concentrated solution [id number]  Take 0.25 mLs (5 mg total) by mouth every 4 (four) hours as needed for up to 3 days for severe pain, breakthrough pain, anxiety, shortness of breath or moderate pain. May give sublingually if needed. Delfino Lovett, MD  Active   PARoxetine (PAXIL) 10 MG tablet [id number] No Take 10 mg by mouth daily. [provider] 06/17/2023 Active Spouse/Significant Other, Child  senna (SENOKOT) 8.6 MG tablet [id number]  Take 2 tablets (17.2 mg total) by mouth 2 (two) times daily. May crush, mix with water and give sublingually if needed. Delfino Lovett, MD  Active             Home Care and Equipment/Supplies:  Were Home Health Services Ordered?: Yes Name of Home Health Agency:: unknown Has Agency set up a time to come to your home?: Yes First Home Health Visit Date: 06/23/23 Any new equipment or medical supplies ordered?: NA  Functional Questionnaire: Do you need assistance with bathing/showering or dressing?:  Yes Do you need assistance with meal preparation?: Yes Do you need assistance with eating?: Yes Do you have difficulty maintaining continence: Yes Do you need assistance with getting out of bed/getting out of a chair/moving?: Yes Do you have difficulty managing or taking your medications?: Yes  Follow up appointments reviewed: PCP Follow-up appointment confirmed?: No (Hospice) MD Provider Line Number:[phone] Given: No Specialist Hospital Follow-up appointment confirmed?: NA Do you need transportation to your follow-up appointment?: No Do you understand care options if your condition(s) worsen?: Yes-patient verbalized understanding    SIGNATURE Karena Addison, LPN [facility] Nurse Health Advisor Direct Dial [phone]

## 2023-06-23 ENCOUNTER — Telehealth: Payer: Self-pay | Admitting: Family Medicine

## 2023-06-23 DIAGNOSIS — E785 Hyperlipidemia, unspecified: Secondary | ICD-10-CM | POA: Diagnosis not present

## 2023-06-23 DIAGNOSIS — G510 Bell's palsy: Secondary | ICD-10-CM | POA: Diagnosis not present

## 2023-06-23 DIAGNOSIS — D1730 Benign lipomatous neoplasm of skin and subcutaneous tissue of unspecified sites: Secondary | ICD-10-CM | POA: Diagnosis not present

## 2023-06-23 DIAGNOSIS — G4733 Obstructive sleep apnea (adult) (pediatric): Secondary | ICD-10-CM | POA: Diagnosis not present

## 2023-06-23 DIAGNOSIS — K219 Gastro-esophageal reflux disease without esophagitis: Secondary | ICD-10-CM | POA: Diagnosis not present

## 2023-06-23 DIAGNOSIS — I10 Essential (primary) hypertension: Secondary | ICD-10-CM | POA: Diagnosis not present

## 2023-06-23 DIAGNOSIS — D500 Iron deficiency anemia secondary to blood loss (chronic): Secondary | ICD-10-CM | POA: Diagnosis not present

## 2023-06-23 DIAGNOSIS — I25119 Atherosclerotic heart disease of native coronary artery with unspecified angina pectoris: Secondary | ICD-10-CM | POA: Diagnosis not present

## 2023-06-23 DIAGNOSIS — E039 Hypothyroidism, unspecified: Secondary | ICD-10-CM | POA: Diagnosis not present

## 2023-06-23 DIAGNOSIS — I679 Cerebrovascular disease, unspecified: Secondary | ICD-10-CM | POA: Diagnosis not present

## 2023-06-23 DIAGNOSIS — N209 Urinary calculus, unspecified: Secondary | ICD-10-CM | POA: Diagnosis not present

## 2023-06-23 DIAGNOSIS — N281 Cyst of kidney, acquired: Secondary | ICD-10-CM | POA: Diagnosis not present

## 2023-06-23 NOTE — Telephone Encounter (Signed)
Jasmine December from [facility] called stated patient has blood in his urine and she knows he is taking the apixaban (ELIQUIS) 5 MG TABS tablet. Jasmine December would like a call back to see what Dr Sherrie Mustache would like to do. Please f/u with Jasmine December

## 2023-06-24 DIAGNOSIS — I25119 Atherosclerotic heart disease of native coronary artery with unspecified angina pectoris: Secondary | ICD-10-CM | POA: Diagnosis not present

## 2023-06-24 DIAGNOSIS — I679 Cerebrovascular disease, unspecified: Secondary | ICD-10-CM | POA: Diagnosis not present

## 2023-06-24 DIAGNOSIS — E785 Hyperlipidemia, unspecified: Secondary | ICD-10-CM | POA: Diagnosis not present

## 2023-06-24 DIAGNOSIS — I10 Essential (primary) hypertension: Secondary | ICD-10-CM | POA: Diagnosis not present

## 2023-06-24 DIAGNOSIS — G510 Bell's palsy: Secondary | ICD-10-CM | POA: Diagnosis not present

## 2023-06-24 DIAGNOSIS — D500 Iron deficiency anemia secondary to blood loss (chronic): Secondary | ICD-10-CM | POA: Diagnosis not present

## 2023-06-24 NOTE — Telephone Encounter (Signed)
Jasmine December with [facility] was advised.

## 2023-06-24 NOTE — Telephone Encounter (Signed)
He should go back down to 2.5mg  twice a day of the Eliquis. He had bilateral pulmonary embolisms when he was in the hospital, so he can't stop the Eliquis, but the 2.5 is just as effective for his age and weight.

## 2023-06-25 ENCOUNTER — Telehealth: Payer: Self-pay | Admitting: Oncology

## 2023-06-25 ENCOUNTER — Inpatient Hospital Stay: Payer: Medicare Other | Admitting: Oncology

## 2023-06-25 ENCOUNTER — Other Ambulatory Visit: Payer: Self-pay | Admitting: Student in an Organized Health Care Education/Training Program

## 2023-06-25 DIAGNOSIS — I25119 Atherosclerotic heart disease of native coronary artery with unspecified angina pectoris: Secondary | ICD-10-CM | POA: Diagnosis not present

## 2023-06-25 DIAGNOSIS — I10 Essential (primary) hypertension: Secondary | ICD-10-CM | POA: Diagnosis not present

## 2023-06-25 DIAGNOSIS — E785 Hyperlipidemia, unspecified: Secondary | ICD-10-CM | POA: Diagnosis not present

## 2023-06-25 DIAGNOSIS — I209 Angina pectoris, unspecified: Secondary | ICD-10-CM

## 2023-06-25 DIAGNOSIS — G510 Bell's palsy: Secondary | ICD-10-CM | POA: Diagnosis not present

## 2023-06-25 DIAGNOSIS — I679 Cerebrovascular disease, unspecified: Secondary | ICD-10-CM | POA: Diagnosis not present

## 2023-06-25 DIAGNOSIS — D500 Iron deficiency anemia secondary to blood loss (chronic): Secondary | ICD-10-CM | POA: Diagnosis not present

## 2023-06-25 NOTE — Telephone Encounter (Signed)
Patients daughter called to cancel appointment with Dr. Orlie Dakin today. She said her dad is now in hospice care. She wanted me to let the team know.

## 2023-06-26 DIAGNOSIS — I25119 Atherosclerotic heart disease of native coronary artery with unspecified angina pectoris: Secondary | ICD-10-CM | POA: Diagnosis not present

## 2023-06-26 DIAGNOSIS — D500 Iron deficiency anemia secondary to blood loss (chronic): Secondary | ICD-10-CM | POA: Diagnosis not present

## 2023-06-26 DIAGNOSIS — I679 Cerebrovascular disease, unspecified: Secondary | ICD-10-CM | POA: Diagnosis not present

## 2023-06-26 DIAGNOSIS — G510 Bell's palsy: Secondary | ICD-10-CM | POA: Diagnosis not present

## 2023-06-26 DIAGNOSIS — I10 Essential (primary) hypertension: Secondary | ICD-10-CM | POA: Diagnosis not present

## 2023-06-26 DIAGNOSIS — E785 Hyperlipidemia, unspecified: Secondary | ICD-10-CM | POA: Diagnosis not present

## 2023-06-27 DIAGNOSIS — E785 Hyperlipidemia, unspecified: Secondary | ICD-10-CM | POA: Diagnosis not present

## 2023-06-27 DIAGNOSIS — G510 Bell's palsy: Secondary | ICD-10-CM | POA: Diagnosis not present

## 2023-06-27 DIAGNOSIS — D500 Iron deficiency anemia secondary to blood loss (chronic): Secondary | ICD-10-CM | POA: Diagnosis not present

## 2023-06-27 DIAGNOSIS — I10 Essential (primary) hypertension: Secondary | ICD-10-CM | POA: Diagnosis not present

## 2023-06-27 DIAGNOSIS — I25119 Atherosclerotic heart disease of native coronary artery with unspecified angina pectoris: Secondary | ICD-10-CM | POA: Diagnosis not present

## 2023-06-27 DIAGNOSIS — I679 Cerebrovascular disease, unspecified: Secondary | ICD-10-CM | POA: Diagnosis not present

## 2023-06-28 DIAGNOSIS — I10 Essential (primary) hypertension: Secondary | ICD-10-CM | POA: Diagnosis not present

## 2023-06-28 DIAGNOSIS — I679 Cerebrovascular disease, unspecified: Secondary | ICD-10-CM | POA: Diagnosis not present

## 2023-06-28 DIAGNOSIS — G510 Bell's palsy: Secondary | ICD-10-CM | POA: Diagnosis not present

## 2023-06-28 DIAGNOSIS — E785 Hyperlipidemia, unspecified: Secondary | ICD-10-CM | POA: Diagnosis not present

## 2023-06-28 DIAGNOSIS — D500 Iron deficiency anemia secondary to blood loss (chronic): Secondary | ICD-10-CM | POA: Diagnosis not present

## 2023-06-28 DIAGNOSIS — I25119 Atherosclerotic heart disease of native coronary artery with unspecified angina pectoris: Secondary | ICD-10-CM | POA: Diagnosis not present

## 2023-06-29 DIAGNOSIS — D500 Iron deficiency anemia secondary to blood loss (chronic): Secondary | ICD-10-CM | POA: Diagnosis not present

## 2023-06-29 DIAGNOSIS — I679 Cerebrovascular disease, unspecified: Secondary | ICD-10-CM | POA: Diagnosis not present

## 2023-06-29 DIAGNOSIS — G510 Bell's palsy: Secondary | ICD-10-CM | POA: Diagnosis not present

## 2023-06-29 DIAGNOSIS — I25119 Atherosclerotic heart disease of native coronary artery with unspecified angina pectoris: Secondary | ICD-10-CM | POA: Diagnosis not present

## 2023-06-29 DIAGNOSIS — I10 Essential (primary) hypertension: Secondary | ICD-10-CM | POA: Diagnosis not present

## 2023-06-29 DIAGNOSIS — E785 Hyperlipidemia, unspecified: Secondary | ICD-10-CM | POA: Diagnosis not present

## 2023-06-30 DIAGNOSIS — I25119 Atherosclerotic heart disease of native coronary artery with unspecified angina pectoris: Secondary | ICD-10-CM | POA: Diagnosis not present

## 2023-06-30 DIAGNOSIS — D500 Iron deficiency anemia secondary to blood loss (chronic): Secondary | ICD-10-CM | POA: Diagnosis not present

## 2023-06-30 DIAGNOSIS — I10 Essential (primary) hypertension: Secondary | ICD-10-CM | POA: Diagnosis not present

## 2023-06-30 DIAGNOSIS — I679 Cerebrovascular disease, unspecified: Secondary | ICD-10-CM | POA: Diagnosis not present

## 2023-06-30 DIAGNOSIS — G510 Bell's palsy: Secondary | ICD-10-CM | POA: Diagnosis not present

## 2023-06-30 DIAGNOSIS — E785 Hyperlipidemia, unspecified: Secondary | ICD-10-CM | POA: Diagnosis not present

## 2023-07-01 DIAGNOSIS — G510 Bell's palsy: Secondary | ICD-10-CM | POA: Diagnosis not present

## 2023-07-01 DIAGNOSIS — I10 Essential (primary) hypertension: Secondary | ICD-10-CM | POA: Diagnosis not present

## 2023-07-01 DIAGNOSIS — D500 Iron deficiency anemia secondary to blood loss (chronic): Secondary | ICD-10-CM | POA: Diagnosis not present

## 2023-07-01 DIAGNOSIS — E785 Hyperlipidemia, unspecified: Secondary | ICD-10-CM | POA: Diagnosis not present

## 2023-07-01 DIAGNOSIS — I25119 Atherosclerotic heart disease of native coronary artery with unspecified angina pectoris: Secondary | ICD-10-CM | POA: Diagnosis not present

## 2023-07-01 DIAGNOSIS — I679 Cerebrovascular disease, unspecified: Secondary | ICD-10-CM | POA: Diagnosis not present

## 2023-07-02 DIAGNOSIS — I10 Essential (primary) hypertension: Secondary | ICD-10-CM | POA: Diagnosis not present

## 2023-07-02 DIAGNOSIS — I679 Cerebrovascular disease, unspecified: Secondary | ICD-10-CM | POA: Diagnosis not present

## 2023-07-02 DIAGNOSIS — E785 Hyperlipidemia, unspecified: Secondary | ICD-10-CM | POA: Diagnosis not present

## 2023-07-02 DIAGNOSIS — I25119 Atherosclerotic heart disease of native coronary artery with unspecified angina pectoris: Secondary | ICD-10-CM | POA: Diagnosis not present

## 2023-07-02 DIAGNOSIS — G510 Bell's palsy: Secondary | ICD-10-CM | POA: Diagnosis not present

## 2023-07-02 DIAGNOSIS — D500 Iron deficiency anemia secondary to blood loss (chronic): Secondary | ICD-10-CM | POA: Diagnosis not present

## 2023-07-03 DIAGNOSIS — I25119 Atherosclerotic heart disease of native coronary artery with unspecified angina pectoris: Secondary | ICD-10-CM | POA: Diagnosis not present

## 2023-07-03 DIAGNOSIS — I10 Essential (primary) hypertension: Secondary | ICD-10-CM | POA: Diagnosis not present

## 2023-07-03 DIAGNOSIS — I679 Cerebrovascular disease, unspecified: Secondary | ICD-10-CM | POA: Diagnosis not present

## 2023-07-03 DIAGNOSIS — E785 Hyperlipidemia, unspecified: Secondary | ICD-10-CM | POA: Diagnosis not present

## 2023-07-03 DIAGNOSIS — G510 Bell's palsy: Secondary | ICD-10-CM | POA: Diagnosis not present

## 2023-07-03 DIAGNOSIS — D500 Iron deficiency anemia secondary to blood loss (chronic): Secondary | ICD-10-CM | POA: Diagnosis not present

## 2023-07-04 DIAGNOSIS — E785 Hyperlipidemia, unspecified: Secondary | ICD-10-CM | POA: Diagnosis not present

## 2023-07-04 DIAGNOSIS — G510 Bell's palsy: Secondary | ICD-10-CM | POA: Diagnosis not present

## 2023-07-04 DIAGNOSIS — D500 Iron deficiency anemia secondary to blood loss (chronic): Secondary | ICD-10-CM | POA: Diagnosis not present

## 2023-07-04 DIAGNOSIS — I679 Cerebrovascular disease, unspecified: Secondary | ICD-10-CM | POA: Diagnosis not present

## 2023-07-04 DIAGNOSIS — I10 Essential (primary) hypertension: Secondary | ICD-10-CM | POA: Diagnosis not present

## 2023-07-04 DIAGNOSIS — I25119 Atherosclerotic heart disease of native coronary artery with unspecified angina pectoris: Secondary | ICD-10-CM | POA: Diagnosis not present

## 2023-07-05 DIAGNOSIS — I25119 Atherosclerotic heart disease of native coronary artery with unspecified angina pectoris: Secondary | ICD-10-CM | POA: Diagnosis not present

## 2023-07-05 DIAGNOSIS — E785 Hyperlipidemia, unspecified: Secondary | ICD-10-CM | POA: Diagnosis not present

## 2023-07-05 DIAGNOSIS — I10 Essential (primary) hypertension: Secondary | ICD-10-CM | POA: Diagnosis not present

## 2023-07-05 DIAGNOSIS — D500 Iron deficiency anemia secondary to blood loss (chronic): Secondary | ICD-10-CM | POA: Diagnosis not present

## 2023-07-05 DIAGNOSIS — I679 Cerebrovascular disease, unspecified: Secondary | ICD-10-CM | POA: Diagnosis not present

## 2023-07-05 DIAGNOSIS — G510 Bell's palsy: Secondary | ICD-10-CM | POA: Diagnosis not present

## 2023-07-06 DIAGNOSIS — I10 Essential (primary) hypertension: Secondary | ICD-10-CM | POA: Diagnosis not present

## 2023-07-06 DIAGNOSIS — E785 Hyperlipidemia, unspecified: Secondary | ICD-10-CM | POA: Diagnosis not present

## 2023-07-06 DIAGNOSIS — I679 Cerebrovascular disease, unspecified: Secondary | ICD-10-CM | POA: Diagnosis not present

## 2023-07-06 DIAGNOSIS — D500 Iron deficiency anemia secondary to blood loss (chronic): Secondary | ICD-10-CM | POA: Diagnosis not present

## 2023-07-06 DIAGNOSIS — G510 Bell's palsy: Secondary | ICD-10-CM | POA: Diagnosis not present

## 2023-07-06 DIAGNOSIS — I25119 Atherosclerotic heart disease of native coronary artery with unspecified angina pectoris: Secondary | ICD-10-CM | POA: Diagnosis not present

## 2023-07-07 DIAGNOSIS — E785 Hyperlipidemia, unspecified: Secondary | ICD-10-CM | POA: Diagnosis not present

## 2023-07-07 DIAGNOSIS — G510 Bell's palsy: Secondary | ICD-10-CM | POA: Diagnosis not present

## 2023-07-07 DIAGNOSIS — I10 Essential (primary) hypertension: Secondary | ICD-10-CM | POA: Diagnosis not present

## 2023-07-07 DIAGNOSIS — D500 Iron deficiency anemia secondary to blood loss (chronic): Secondary | ICD-10-CM | POA: Diagnosis not present

## 2023-07-07 DIAGNOSIS — I679 Cerebrovascular disease, unspecified: Secondary | ICD-10-CM | POA: Diagnosis not present

## 2023-07-07 DIAGNOSIS — I25119 Atherosclerotic heart disease of native coronary artery with unspecified angina pectoris: Secondary | ICD-10-CM | POA: Diagnosis not present

## 2023-07-08 DIAGNOSIS — I10 Essential (primary) hypertension: Secondary | ICD-10-CM | POA: Diagnosis not present

## 2023-07-08 DIAGNOSIS — E785 Hyperlipidemia, unspecified: Secondary | ICD-10-CM | POA: Diagnosis not present

## 2023-07-08 DIAGNOSIS — I679 Cerebrovascular disease, unspecified: Secondary | ICD-10-CM | POA: Diagnosis not present

## 2023-07-08 DIAGNOSIS — G510 Bell's palsy: Secondary | ICD-10-CM | POA: Diagnosis not present

## 2023-07-08 DIAGNOSIS — D500 Iron deficiency anemia secondary to blood loss (chronic): Secondary | ICD-10-CM | POA: Diagnosis not present

## 2023-07-08 DIAGNOSIS — I25119 Atherosclerotic heart disease of native coronary artery with unspecified angina pectoris: Secondary | ICD-10-CM | POA: Diagnosis not present

## 2023-07-09 DIAGNOSIS — I679 Cerebrovascular disease, unspecified: Secondary | ICD-10-CM | POA: Diagnosis not present

## 2023-07-09 DIAGNOSIS — I25119 Atherosclerotic heart disease of native coronary artery with unspecified angina pectoris: Secondary | ICD-10-CM | POA: Diagnosis not present

## 2023-07-09 DIAGNOSIS — I10 Essential (primary) hypertension: Secondary | ICD-10-CM | POA: Diagnosis not present

## 2023-07-09 DIAGNOSIS — G510 Bell's palsy: Secondary | ICD-10-CM | POA: Diagnosis not present

## 2023-07-09 DIAGNOSIS — D500 Iron deficiency anemia secondary to blood loss (chronic): Secondary | ICD-10-CM | POA: Diagnosis not present

## 2023-07-09 DIAGNOSIS — E785 Hyperlipidemia, unspecified: Secondary | ICD-10-CM | POA: Diagnosis not present

## 2023-07-10 DIAGNOSIS — I25119 Atherosclerotic heart disease of native coronary artery with unspecified angina pectoris: Secondary | ICD-10-CM | POA: Diagnosis not present

## 2023-07-10 DIAGNOSIS — D500 Iron deficiency anemia secondary to blood loss (chronic): Secondary | ICD-10-CM | POA: Diagnosis not present

## 2023-07-10 DIAGNOSIS — I679 Cerebrovascular disease, unspecified: Secondary | ICD-10-CM | POA: Diagnosis not present

## 2023-07-10 DIAGNOSIS — I10 Essential (primary) hypertension: Secondary | ICD-10-CM | POA: Diagnosis not present

## 2023-07-10 DIAGNOSIS — E785 Hyperlipidemia, unspecified: Secondary | ICD-10-CM | POA: Diagnosis not present

## 2023-07-10 DIAGNOSIS — G510 Bell's palsy: Secondary | ICD-10-CM | POA: Diagnosis not present

## 2023-07-11 DIAGNOSIS — I25119 Atherosclerotic heart disease of native coronary artery with unspecified angina pectoris: Secondary | ICD-10-CM | POA: Diagnosis not present

## 2023-07-11 DIAGNOSIS — I10 Essential (primary) hypertension: Secondary | ICD-10-CM | POA: Diagnosis not present

## 2023-07-11 DIAGNOSIS — D500 Iron deficiency anemia secondary to blood loss (chronic): Secondary | ICD-10-CM | POA: Diagnosis not present

## 2023-07-11 DIAGNOSIS — I679 Cerebrovascular disease, unspecified: Secondary | ICD-10-CM | POA: Diagnosis not present

## 2023-07-11 DIAGNOSIS — G510 Bell's palsy: Secondary | ICD-10-CM | POA: Diagnosis not present

## 2023-07-11 DIAGNOSIS — E785 Hyperlipidemia, unspecified: Secondary | ICD-10-CM | POA: Diagnosis not present

## 2023-07-12 DIAGNOSIS — I679 Cerebrovascular disease, unspecified: Secondary | ICD-10-CM | POA: Diagnosis not present

## 2023-07-12 DIAGNOSIS — I10 Essential (primary) hypertension: Secondary | ICD-10-CM | POA: Diagnosis not present

## 2023-07-12 DIAGNOSIS — I25119 Atherosclerotic heart disease of native coronary artery with unspecified angina pectoris: Secondary | ICD-10-CM | POA: Diagnosis not present

## 2023-07-12 DIAGNOSIS — G510 Bell's palsy: Secondary | ICD-10-CM | POA: Diagnosis not present

## 2023-07-12 DIAGNOSIS — E785 Hyperlipidemia, unspecified: Secondary | ICD-10-CM | POA: Diagnosis not present

## 2023-07-12 DIAGNOSIS — D500 Iron deficiency anemia secondary to blood loss (chronic): Secondary | ICD-10-CM | POA: Diagnosis not present

## 2023-07-13 DIAGNOSIS — I679 Cerebrovascular disease, unspecified: Secondary | ICD-10-CM | POA: Diagnosis not present

## 2023-07-13 DIAGNOSIS — G510 Bell's palsy: Secondary | ICD-10-CM | POA: Diagnosis not present

## 2023-07-13 DIAGNOSIS — D500 Iron deficiency anemia secondary to blood loss (chronic): Secondary | ICD-10-CM | POA: Diagnosis not present

## 2023-07-13 DIAGNOSIS — I10 Essential (primary) hypertension: Secondary | ICD-10-CM | POA: Diagnosis not present

## 2023-07-13 DIAGNOSIS — I25119 Atherosclerotic heart disease of native coronary artery with unspecified angina pectoris: Secondary | ICD-10-CM | POA: Diagnosis not present

## 2023-07-13 DIAGNOSIS — E785 Hyperlipidemia, unspecified: Secondary | ICD-10-CM | POA: Diagnosis not present

## 2023-07-14 DIAGNOSIS — I679 Cerebrovascular disease, unspecified: Secondary | ICD-10-CM | POA: Diagnosis not present

## 2023-07-14 DIAGNOSIS — I10 Essential (primary) hypertension: Secondary | ICD-10-CM | POA: Diagnosis not present

## 2023-07-14 DIAGNOSIS — I25119 Atherosclerotic heart disease of native coronary artery with unspecified angina pectoris: Secondary | ICD-10-CM | POA: Diagnosis not present

## 2023-07-14 DIAGNOSIS — G510 Bell's palsy: Secondary | ICD-10-CM | POA: Diagnosis not present

## 2023-07-14 DIAGNOSIS — E785 Hyperlipidemia, unspecified: Secondary | ICD-10-CM | POA: Diagnosis not present

## 2023-07-14 DIAGNOSIS — D500 Iron deficiency anemia secondary to blood loss (chronic): Secondary | ICD-10-CM | POA: Diagnosis not present

## 2023-07-15 DIAGNOSIS — I679 Cerebrovascular disease, unspecified: Secondary | ICD-10-CM | POA: Diagnosis not present

## 2023-07-15 DIAGNOSIS — D500 Iron deficiency anemia secondary to blood loss (chronic): Secondary | ICD-10-CM | POA: Diagnosis not present

## 2023-07-15 DIAGNOSIS — I25119 Atherosclerotic heart disease of native coronary artery with unspecified angina pectoris: Secondary | ICD-10-CM | POA: Diagnosis not present

## 2023-07-15 DIAGNOSIS — E785 Hyperlipidemia, unspecified: Secondary | ICD-10-CM | POA: Diagnosis not present

## 2023-07-15 DIAGNOSIS — I10 Essential (primary) hypertension: Secondary | ICD-10-CM | POA: Diagnosis not present

## 2023-07-15 DIAGNOSIS — G510 Bell's palsy: Secondary | ICD-10-CM | POA: Diagnosis not present

## 2023-07-16 ENCOUNTER — Ambulatory Visit: Payer: Self-pay | Admitting: *Deleted

## 2023-07-16 DIAGNOSIS — G510 Bell's palsy: Secondary | ICD-10-CM | POA: Diagnosis not present

## 2023-07-16 DIAGNOSIS — I679 Cerebrovascular disease, unspecified: Secondary | ICD-10-CM | POA: Diagnosis not present

## 2023-07-16 DIAGNOSIS — I10 Essential (primary) hypertension: Secondary | ICD-10-CM | POA: Diagnosis not present

## 2023-07-16 DIAGNOSIS — D500 Iron deficiency anemia secondary to blood loss (chronic): Secondary | ICD-10-CM | POA: Diagnosis not present

## 2023-07-16 DIAGNOSIS — E785 Hyperlipidemia, unspecified: Secondary | ICD-10-CM | POA: Diagnosis not present

## 2023-07-16 DIAGNOSIS — I25119 Atherosclerotic heart disease of native coronary artery with unspecified angina pectoris: Secondary | ICD-10-CM | POA: Diagnosis not present

## 2023-07-16 NOTE — Telephone Encounter (Signed)
  Chief Complaint: Daughter asking Dr. Sherrie Mustache to give permission to stop the Eliquis at the request of the Hospice nurse.   "I would feel better about his input on this too". Symptoms: blood in stools for several weeks. Frequency: Several weeks Pertinent Negatives: Patient denies N/A Disposition: [] ED /[] Urgent Care (no appt availability in office) / [] Appointment(In office/virtual)/ []  [place] [facility]/ [] Home Care/ [] Refused Recommended Disposition /[] [place] [facility]/ [x]  Follow-up with PCP Additional Notes: High priority message sent to Dr. Sherrie Mustache.   Contact [name] Welborn at [phone].

## 2023-07-16 NOTE — Telephone Encounter (Addendum)
That's fine since he is in hospice care, so long as family understands that the Eliquis is for treatment of blood clots in his lungs which are potentially life threatening.

## 2023-07-16 NOTE — Telephone Encounter (Signed)
Daughter advised. Verbalized " thank you for that and we understand the risk"

## 2023-07-16 NOTE — Telephone Encounter (Signed)
Daughter Roger Cook calling in.   My father is bleeding and he is on Eliquis.   Blood in his stools.   He is in Hospice.   He passed out and we called the Hospice nurse.   They recommended he stop the Eliquis.   Dr. Sherrie Mustache wants the Eliquis continued due to clots in his lungs.     My stepmother and myself are POA.     Can Dr. Sherrie Mustache say it's ok to stop the Eliquis.    He is bleeding internally.   Blood in stools for several weeks.      Reason for Disposition  Patient or caregiver has NONURGENT question and triager unable to answer question    Wants to stop Eliquis per Hospice  Answer Assessment - Initial Assessment Questions 1. REASON FOR CALL: "What is your main concern right now?"     Daughter Roger Cook calling in.   Her father is having blood in his stools for several weeks now.   He is on Eliquis for blood clots in his lungs. 2. ONSET: "When did the bleeding in his stools start?"     This has been going on for several weeks now. 3. SEVERITY: "How bad is the bleeding?"     It's in his stools. 4. FEVER: "Do you have a fever?"     Not asked 5. OTHER SYMPTOMS: "Do you have any other new symptoms?"     He perks up and then is not as responsive. 6. TREATMENTS AND RESPONSE: "What have you done so far to try to make this better? What medicines have you used?"     [name] is requesting that Dr. Sherrie Mustache, if it's ok with him, give permission to stop the Eliquis.    Hospice is recommending the Eliquis be stopped.   "I would feel better if Dr. Sherrie Mustache would let us know if this is ok or not".     He has passed out once and we called the Hospice nurse in.   That's when they recommended the Eliquis be stopped. 7. CALLER's COPING: "How are you doing?" "How are other family members and loved ones doing?"     Dr. Sherrie Mustache can contact Roger Cook at [phone].  Protocols used: Hospice - No Guideline Available-[facility]

## 2023-07-17 DIAGNOSIS — I679 Cerebrovascular disease, unspecified: Secondary | ICD-10-CM | POA: Diagnosis not present

## 2023-07-17 DIAGNOSIS — I25119 Atherosclerotic heart disease of native coronary artery with unspecified angina pectoris: Secondary | ICD-10-CM | POA: Diagnosis not present

## 2023-07-17 DIAGNOSIS — G510 Bell's palsy: Secondary | ICD-10-CM | POA: Diagnosis not present

## 2023-07-17 DIAGNOSIS — D500 Iron deficiency anemia secondary to blood loss (chronic): Secondary | ICD-10-CM | POA: Diagnosis not present

## 2023-07-17 DIAGNOSIS — E785 Hyperlipidemia, unspecified: Secondary | ICD-10-CM | POA: Diagnosis not present

## 2023-07-17 DIAGNOSIS — I10 Essential (primary) hypertension: Secondary | ICD-10-CM | POA: Diagnosis not present

## 2023-07-18 DIAGNOSIS — G510 Bell's palsy: Secondary | ICD-10-CM | POA: Diagnosis not present

## 2023-07-18 DIAGNOSIS — I25119 Atherosclerotic heart disease of native coronary artery with unspecified angina pectoris: Secondary | ICD-10-CM | POA: Diagnosis not present

## 2023-07-18 DIAGNOSIS — I679 Cerebrovascular disease, unspecified: Secondary | ICD-10-CM | POA: Diagnosis not present

## 2023-07-18 DIAGNOSIS — I10 Essential (primary) hypertension: Secondary | ICD-10-CM | POA: Diagnosis not present

## 2023-07-18 DIAGNOSIS — D500 Iron deficiency anemia secondary to blood loss (chronic): Secondary | ICD-10-CM | POA: Diagnosis not present

## 2023-07-18 DIAGNOSIS — E785 Hyperlipidemia, unspecified: Secondary | ICD-10-CM | POA: Diagnosis not present

## 2023-07-19 DIAGNOSIS — G510 Bell's palsy: Secondary | ICD-10-CM | POA: Diagnosis not present

## 2023-07-19 DIAGNOSIS — I10 Essential (primary) hypertension: Secondary | ICD-10-CM | POA: Diagnosis not present

## 2023-07-19 DIAGNOSIS — D500 Iron deficiency anemia secondary to blood loss (chronic): Secondary | ICD-10-CM | POA: Diagnosis not present

## 2023-07-19 DIAGNOSIS — I25119 Atherosclerotic heart disease of native coronary artery with unspecified angina pectoris: Secondary | ICD-10-CM | POA: Diagnosis not present

## 2023-07-19 DIAGNOSIS — E785 Hyperlipidemia, unspecified: Secondary | ICD-10-CM | POA: Diagnosis not present

## 2023-07-19 DIAGNOSIS — I679 Cerebrovascular disease, unspecified: Secondary | ICD-10-CM | POA: Diagnosis not present

## 2023-07-20 DIAGNOSIS — I10 Essential (primary) hypertension: Secondary | ICD-10-CM | POA: Diagnosis not present

## 2023-07-20 DIAGNOSIS — I25119 Atherosclerotic heart disease of native coronary artery with unspecified angina pectoris: Secondary | ICD-10-CM | POA: Diagnosis not present

## 2023-07-20 DIAGNOSIS — G510 Bell's palsy: Secondary | ICD-10-CM | POA: Diagnosis not present

## 2023-07-20 DIAGNOSIS — I679 Cerebrovascular disease, unspecified: Secondary | ICD-10-CM | POA: Diagnosis not present

## 2023-07-20 DIAGNOSIS — E785 Hyperlipidemia, unspecified: Secondary | ICD-10-CM | POA: Diagnosis not present

## 2023-07-20 DIAGNOSIS — D500 Iron deficiency anemia secondary to blood loss (chronic): Secondary | ICD-10-CM | POA: Diagnosis not present

## 2023-07-21 DIAGNOSIS — E785 Hyperlipidemia, unspecified: Secondary | ICD-10-CM | POA: Diagnosis not present

## 2023-07-21 DIAGNOSIS — D500 Iron deficiency anemia secondary to blood loss (chronic): Secondary | ICD-10-CM | POA: Diagnosis not present

## 2023-07-21 DIAGNOSIS — I679 Cerebrovascular disease, unspecified: Secondary | ICD-10-CM | POA: Diagnosis not present

## 2023-07-21 DIAGNOSIS — G510 Bell's palsy: Secondary | ICD-10-CM | POA: Diagnosis not present

## 2023-07-21 DIAGNOSIS — I25119 Atherosclerotic heart disease of native coronary artery with unspecified angina pectoris: Secondary | ICD-10-CM | POA: Diagnosis not present

## 2023-07-21 DIAGNOSIS — I10 Essential (primary) hypertension: Secondary | ICD-10-CM | POA: Diagnosis not present

## 2023-07-22 DIAGNOSIS — I25119 Atherosclerotic heart disease of native coronary artery with unspecified angina pectoris: Secondary | ICD-10-CM | POA: Diagnosis not present

## 2023-07-22 DIAGNOSIS — I10 Essential (primary) hypertension: Secondary | ICD-10-CM | POA: Diagnosis not present

## 2023-07-22 DIAGNOSIS — D500 Iron deficiency anemia secondary to blood loss (chronic): Secondary | ICD-10-CM | POA: Diagnosis not present

## 2023-07-22 DIAGNOSIS — I679 Cerebrovascular disease, unspecified: Secondary | ICD-10-CM | POA: Diagnosis not present

## 2023-07-22 DIAGNOSIS — E785 Hyperlipidemia, unspecified: Secondary | ICD-10-CM | POA: Diagnosis not present

## 2023-07-22 DIAGNOSIS — G510 Bell's palsy: Secondary | ICD-10-CM | POA: Diagnosis not present

## 2023-07-23 DIAGNOSIS — D500 Iron deficiency anemia secondary to blood loss (chronic): Secondary | ICD-10-CM | POA: Diagnosis not present

## 2023-07-23 DIAGNOSIS — I10 Essential (primary) hypertension: Secondary | ICD-10-CM | POA: Diagnosis not present

## 2023-07-23 DIAGNOSIS — E785 Hyperlipidemia, unspecified: Secondary | ICD-10-CM | POA: Diagnosis not present

## 2023-07-23 DIAGNOSIS — G510 Bell's palsy: Secondary | ICD-10-CM | POA: Diagnosis not present

## 2023-07-23 DIAGNOSIS — I679 Cerebrovascular disease, unspecified: Secondary | ICD-10-CM | POA: Diagnosis not present

## 2023-07-23 DIAGNOSIS — I25119 Atherosclerotic heart disease of native coronary artery with unspecified angina pectoris: Secondary | ICD-10-CM | POA: Diagnosis not present

## 2023-07-24 DIAGNOSIS — K219 Gastro-esophageal reflux disease without esophagitis: Secondary | ICD-10-CM | POA: Diagnosis not present

## 2023-07-24 DIAGNOSIS — N281 Cyst of kidney, acquired: Secondary | ICD-10-CM | POA: Diagnosis not present

## 2023-07-24 DIAGNOSIS — N209 Urinary calculus, unspecified: Secondary | ICD-10-CM | POA: Diagnosis not present

## 2023-07-24 DIAGNOSIS — G510 Bell's palsy: Secondary | ICD-10-CM | POA: Diagnosis not present

## 2023-07-24 DIAGNOSIS — E039 Hypothyroidism, unspecified: Secondary | ICD-10-CM | POA: Diagnosis not present

## 2023-07-24 DIAGNOSIS — D1730 Benign lipomatous neoplasm of skin and subcutaneous tissue of unspecified sites: Secondary | ICD-10-CM | POA: Diagnosis not present

## 2023-07-24 DIAGNOSIS — G4733 Obstructive sleep apnea (adult) (pediatric): Secondary | ICD-10-CM | POA: Diagnosis not present

## 2023-07-24 DIAGNOSIS — I25119 Atherosclerotic heart disease of native coronary artery with unspecified angina pectoris: Secondary | ICD-10-CM | POA: Diagnosis not present

## 2023-07-24 DIAGNOSIS — I10 Essential (primary) hypertension: Secondary | ICD-10-CM | POA: Diagnosis not present

## 2023-07-24 DIAGNOSIS — E785 Hyperlipidemia, unspecified: Secondary | ICD-10-CM | POA: Diagnosis not present

## 2023-07-24 DIAGNOSIS — I679 Cerebrovascular disease, unspecified: Secondary | ICD-10-CM | POA: Diagnosis not present

## 2023-07-24 DIAGNOSIS — D500 Iron deficiency anemia secondary to blood loss (chronic): Secondary | ICD-10-CM | POA: Diagnosis not present

## 2023-07-27 DIAGNOSIS — I679 Cerebrovascular disease, unspecified: Secondary | ICD-10-CM | POA: Diagnosis not present

## 2023-07-27 DIAGNOSIS — E785 Hyperlipidemia, unspecified: Secondary | ICD-10-CM | POA: Diagnosis not present

## 2023-07-27 DIAGNOSIS — I25119 Atherosclerotic heart disease of native coronary artery with unspecified angina pectoris: Secondary | ICD-10-CM | POA: Diagnosis not present

## 2023-07-27 DIAGNOSIS — G510 Bell's palsy: Secondary | ICD-10-CM | POA: Diagnosis not present

## 2023-07-27 DIAGNOSIS — D500 Iron deficiency anemia secondary to blood loss (chronic): Secondary | ICD-10-CM | POA: Diagnosis not present

## 2023-07-27 DIAGNOSIS — I10 Essential (primary) hypertension: Secondary | ICD-10-CM | POA: Diagnosis not present

## 2023-07-29 DIAGNOSIS — I679 Cerebrovascular disease, unspecified: Secondary | ICD-10-CM | POA: Diagnosis not present

## 2023-07-29 DIAGNOSIS — D500 Iron deficiency anemia secondary to blood loss (chronic): Secondary | ICD-10-CM | POA: Diagnosis not present

## 2023-07-29 DIAGNOSIS — I10 Essential (primary) hypertension: Secondary | ICD-10-CM | POA: Diagnosis not present

## 2023-07-29 DIAGNOSIS — I25119 Atherosclerotic heart disease of native coronary artery with unspecified angina pectoris: Secondary | ICD-10-CM | POA: Diagnosis not present

## 2023-07-29 DIAGNOSIS — G510 Bell's palsy: Secondary | ICD-10-CM | POA: Diagnosis not present

## 2023-07-29 DIAGNOSIS — E785 Hyperlipidemia, unspecified: Secondary | ICD-10-CM | POA: Diagnosis not present

## 2023-07-30 DIAGNOSIS — E785 Hyperlipidemia, unspecified: Secondary | ICD-10-CM | POA: Diagnosis not present

## 2023-07-30 DIAGNOSIS — I10 Essential (primary) hypertension: Secondary | ICD-10-CM | POA: Diagnosis not present

## 2023-07-30 DIAGNOSIS — I25119 Atherosclerotic heart disease of native coronary artery with unspecified angina pectoris: Secondary | ICD-10-CM | POA: Diagnosis not present

## 2023-07-30 DIAGNOSIS — D500 Iron deficiency anemia secondary to blood loss (chronic): Secondary | ICD-10-CM | POA: Diagnosis not present

## 2023-07-30 DIAGNOSIS — I679 Cerebrovascular disease, unspecified: Secondary | ICD-10-CM | POA: Diagnosis not present

## 2023-07-30 DIAGNOSIS — G510 Bell's palsy: Secondary | ICD-10-CM | POA: Diagnosis not present

## 2023-08-01 DIAGNOSIS — I679 Cerebrovascular disease, unspecified: Secondary | ICD-10-CM | POA: Diagnosis not present

## 2023-08-01 DIAGNOSIS — I10 Essential (primary) hypertension: Secondary | ICD-10-CM | POA: Diagnosis not present

## 2023-08-01 DIAGNOSIS — G510 Bell's palsy: Secondary | ICD-10-CM | POA: Diagnosis not present

## 2023-08-01 DIAGNOSIS — E785 Hyperlipidemia, unspecified: Secondary | ICD-10-CM | POA: Diagnosis not present

## 2023-08-01 DIAGNOSIS — I25119 Atherosclerotic heart disease of native coronary artery with unspecified angina pectoris: Secondary | ICD-10-CM | POA: Diagnosis not present

## 2023-08-01 DIAGNOSIS — D500 Iron deficiency anemia secondary to blood loss (chronic): Secondary | ICD-10-CM | POA: Diagnosis not present

## 2023-08-02 DIAGNOSIS — I679 Cerebrovascular disease, unspecified: Secondary | ICD-10-CM | POA: Diagnosis not present

## 2023-08-02 DIAGNOSIS — E785 Hyperlipidemia, unspecified: Secondary | ICD-10-CM | POA: Diagnosis not present

## 2023-08-02 DIAGNOSIS — I10 Essential (primary) hypertension: Secondary | ICD-10-CM | POA: Diagnosis not present

## 2023-08-02 DIAGNOSIS — D500 Iron deficiency anemia secondary to blood loss (chronic): Secondary | ICD-10-CM | POA: Diagnosis not present

## 2023-08-02 DIAGNOSIS — G510 Bell's palsy: Secondary | ICD-10-CM | POA: Diagnosis not present

## 2023-08-02 DIAGNOSIS — I25119 Atherosclerotic heart disease of native coronary artery with unspecified angina pectoris: Secondary | ICD-10-CM | POA: Diagnosis not present

## 2023-08-03 DIAGNOSIS — G510 Bell's palsy: Secondary | ICD-10-CM | POA: Diagnosis not present

## 2023-08-03 DIAGNOSIS — E785 Hyperlipidemia, unspecified: Secondary | ICD-10-CM | POA: Diagnosis not present

## 2023-08-03 DIAGNOSIS — I25119 Atherosclerotic heart disease of native coronary artery with unspecified angina pectoris: Secondary | ICD-10-CM | POA: Diagnosis not present

## 2023-08-03 DIAGNOSIS — D500 Iron deficiency anemia secondary to blood loss (chronic): Secondary | ICD-10-CM | POA: Diagnosis not present

## 2023-08-03 DIAGNOSIS — I10 Essential (primary) hypertension: Secondary | ICD-10-CM | POA: Diagnosis not present

## 2023-08-03 DIAGNOSIS — I679 Cerebrovascular disease, unspecified: Secondary | ICD-10-CM | POA: Diagnosis not present

## 2023-08-05 ENCOUNTER — Telehealth: Payer: Self-pay | Admitting: Family Medicine

## 2023-08-05 NOTE — Telephone Encounter (Signed)
Requires a time of death, it will not let me sign without it, and I haven't gotten any information regarding time of death.

## 2023-08-05 NOTE — Telephone Encounter (Signed)
Copied from CRM [id number]. Topic: General - Deceased Patient >> August 18, 2023  4:09 PM Lennox Pippins wrote: Foristell Sink with [facility] has called and states they received the death certificate for the patient and the cause of death is on there, however the Provider, Dr Sherrie Mustache, did not sign and date this. Per Sciota Sink, she needs this signed and dated and sent back.  Wyline Mood callback # [phone]

## 2023-08-10 ENCOUNTER — Telehealth: Payer: Self-pay

## 2023-08-10 NOTE — Telephone Encounter (Signed)
This message was originally taken by [facility] agent  Pt is calling in because her dad Roger Cook (Date of birth: [birth year]) who was one of his patients passed away last week and the funeral home is trying to finalize the death certificate. The cause of death states Alzheimers/Dementia and Elease Hashimoto says that [name] never had Alzheimers and she would like to clear this up so the correct information is on the certificate. Elease Hashimoto says this is an urgent matter and is requesting a call back today to get this sorted out. She can be reached at [phone]     Patient did not have anyone on his DPR

## 2023-08-12 NOTE — Telephone Encounter (Addendum)
Pt's daughter, Alphonsus Sias, called in again requesting clarification on patient's death certificate.   Daughter is requesting call back as soon as possible, [phone]

## 2023-08-13 ENCOUNTER — Telehealth: Payer: Self-pay

## 2023-08-13 NOTE — Telephone Encounter (Signed)
Pt's daughter is asking for a call back tomorrow

## 2023-08-13 NOTE — Telephone Encounter (Signed)
He has had dementia for a few years. He probably had Alzeheimer's, but Alzheimer's can really only be diagnosed by autopsy since there is no specific test for it. We often have to go by our best educated guess when completing death certificates if an autopsy is not done.. .  We can change to idiopathic dementia if they have a problem with Alzheimer's on his death certificate.

## 2023-08-13 NOTE — Telephone Encounter (Signed)
 Copied from CRM [id number]. Topic: General - Other >> Aug 12, 2023  4:47 PM Everette C wrote: Reason for CRM: The patient's daughter would like to speak with a member of staff when possible regarding concerns related to the patient's death certificate   Please contact when possible >> Aug 12, 2023  4:54 PM Macon Large wrote: Eber Jones McPhearson with [facility] stated that pt daughter reports that pt was never diagnosed with alzheimer/dementia and is concerned that the death certificate was signed and the reason/cause of death. Lory requests call back at [phone]

## 2023-08-23 DEATH — deceased
